# Patient Record
Sex: Male | Born: 1935
Health system: Southern US, Community
[De-identification: ages and names within clinical notes are randomized; demographics above are authoritative.]

## PROBLEM LIST (undated history)

## (undated) DIAGNOSIS — Z9289 Personal history of other medical treatment: Secondary | ICD-10-CM

## (undated) DIAGNOSIS — K5090 Crohn's disease, unspecified, without complications: Secondary | ICD-10-CM

## (undated) DIAGNOSIS — R9439 Abnormal result of other cardiovascular function study: Secondary | ICD-10-CM

## (undated) DIAGNOSIS — G621 Alcoholic polyneuropathy: Secondary | ICD-10-CM

## (undated) DIAGNOSIS — I10 Essential (primary) hypertension: Secondary | ICD-10-CM

## (undated) DIAGNOSIS — K5790 Diverticulosis of intestine, part unspecified, without perforation or abscess without bleeding: Secondary | ICD-10-CM

## (undated) DIAGNOSIS — F411 Generalized anxiety disorder: Secondary | ICD-10-CM

## (undated) DIAGNOSIS — J45909 Unspecified asthma, uncomplicated: Secondary | ICD-10-CM

## (undated) DIAGNOSIS — N411 Chronic prostatitis: Secondary | ICD-10-CM

## (undated) DIAGNOSIS — K644 Residual hemorrhoidal skin tags: Secondary | ICD-10-CM

## (undated) DIAGNOSIS — K449 Diaphragmatic hernia without obstruction or gangrene: Secondary | ICD-10-CM

## (undated) DIAGNOSIS — M5030 Other cervical disc degeneration, unspecified cervical region: Secondary | ICD-10-CM

## (undated) DIAGNOSIS — K219 Gastro-esophageal reflux disease without esophagitis: Secondary | ICD-10-CM

## (undated) DIAGNOSIS — J029 Acute pharyngitis, unspecified: Secondary | ICD-10-CM

## (undated) DIAGNOSIS — E039 Hypothyroidism, unspecified: Secondary | ICD-10-CM

## (undated) DIAGNOSIS — M1990 Unspecified osteoarthritis, unspecified site: Secondary | ICD-10-CM

## (undated) DIAGNOSIS — M109 Gout, unspecified: Secondary | ICD-10-CM

## (undated) DIAGNOSIS — R634 Abnormal weight loss: Secondary | ICD-10-CM

## (undated) DIAGNOSIS — R972 Elevated prostate specific antigen [PSA]: Secondary | ICD-10-CM

## (undated) DIAGNOSIS — M1710 Unilateral primary osteoarthritis, unspecified knee: Secondary | ICD-10-CM

## (undated) DIAGNOSIS — G5600 Carpal tunnel syndrome, unspecified upper limb: Secondary | ICD-10-CM

## (undated) DIAGNOSIS — H269 Unspecified cataract: Secondary | ICD-10-CM

## (undated) DIAGNOSIS — F1011 Alcohol abuse, in remission: Secondary | ICD-10-CM

## (undated) DIAGNOSIS — J449 Chronic obstructive pulmonary disease, unspecified: Secondary | ICD-10-CM

## (undated) DIAGNOSIS — E785 Hyperlipidemia, unspecified: Secondary | ICD-10-CM

## (undated) DIAGNOSIS — I2510 Atherosclerotic heart disease of native coronary artery without angina pectoris: Secondary | ICD-10-CM

## (undated) DIAGNOSIS — D1803 Hemangioma of intra-abdominal structures: Secondary | ICD-10-CM

## (undated) DIAGNOSIS — M25559 Pain in unspecified hip: Secondary | ICD-10-CM

## (undated) DIAGNOSIS — D518 Other vitamin B12 deficiency anemias: Secondary | ICD-10-CM

## (undated) DIAGNOSIS — R160 Hepatomegaly, not elsewhere classified: Secondary | ICD-10-CM

## (undated) DIAGNOSIS — K222 Esophageal obstruction: Secondary | ICD-10-CM

## (undated) DIAGNOSIS — R55 Syncope and collapse: Secondary | ICD-10-CM

## (undated) DIAGNOSIS — Z86010 Personal history of colonic polyps: Secondary | ICD-10-CM

## (undated) DIAGNOSIS — R1310 Dysphagia, unspecified: Secondary | ICD-10-CM

## (undated) DIAGNOSIS — I447 Left bundle-branch block, unspecified: Secondary | ICD-10-CM

## (undated) DIAGNOSIS — J329 Chronic sinusitis, unspecified: Secondary | ICD-10-CM

## (undated) DIAGNOSIS — M7500 Adhesive capsulitis of unspecified shoulder: Secondary | ICD-10-CM

## (undated) HISTORY — DX: Chronic sinusitis, unspecified: J32.9

## (undated) HISTORY — DX: Left bundle-branch block, unspecified: I44.7

## (undated) HISTORY — DX: Syncope and collapse: R55

## (undated) HISTORY — DX: Elevated prostate specific antigen (PSA): R97.20

## (undated) HISTORY — DX: Personal history of other medical treatment: Z92.89

## (undated) HISTORY — DX: Chronic prostatitis: N41.1

## (undated) HISTORY — DX: Other cervical disc degeneration, unspecified cervical region: M50.30

## (undated) HISTORY — DX: Adhesive capsulitis of unspecified shoulder: M75.00

## (undated) HISTORY — DX: Chronic obstructive pulmonary disease, unspecified: J44.9

## (undated) HISTORY — DX: Gastro-esophageal reflux disease without esophagitis: K21.9

## (undated) HISTORY — DX: Unspecified asthma, uncomplicated: J45.909

## (undated) HISTORY — DX: Unilateral primary osteoarthritis, unspecified knee: M17.10

## (undated) HISTORY — PX: CARDIAC CATHETERIZATION: SHX172

## (undated) HISTORY — DX: Abnormal weight loss: R63.4

## (undated) HISTORY — DX: Dysphagia, unspecified: R13.10

## (undated) HISTORY — DX: Crohn's disease, unspecified, without complications: K50.90

## (undated) HISTORY — DX: Atherosclerotic heart disease of native coronary artery without angina pectoris: I25.10

## (undated) HISTORY — DX: Essential (primary) hypertension: I10

## (undated) HISTORY — DX: Diverticulosis of intestine, part unspecified, without perforation or abscess without bleeding: K57.90

## (undated) HISTORY — DX: Abnormal result of other cardiovascular function study: R94.39

## (undated) HISTORY — DX: Diaphragmatic hernia without obstruction or gangrene: K44.9

## (undated) HISTORY — DX: Alcohol abuse, in remission: F10.11

## (undated) HISTORY — DX: Residual hemorrhoidal skin tags: K64.4

## (undated) HISTORY — DX: Hyperlipidemia, unspecified: E78.5

## (undated) HISTORY — DX: Esophageal obstruction: K22.2

## (undated) HISTORY — DX: Carpal tunnel syndrome, unspecified upper limb: G56.00

## (undated) HISTORY — DX: Alcoholic polyneuropathy: G62.1

## (undated) HISTORY — DX: Generalized anxiety disorder: F41.1

## (undated) HISTORY — DX: Gout, unspecified: M10.9

## (undated) HISTORY — DX: Hemangioma of intra-abdominal structures: D18.03

## (undated) HISTORY — DX: Personal history of colonic polyps: Z86.010

## (undated) HISTORY — DX: Unspecified cataract: H26.9

## (undated) HISTORY — DX: Unspecified osteoarthritis, unspecified site: M19.90

## (undated) HISTORY — PX: OTHER SURGICAL HISTORY: SHX169

## (undated) HISTORY — PX: EYE SURGERY: SHX253

## (undated) HISTORY — PX: TONSILLECTOMY: SUR1361

## (undated) HISTORY — DX: Other vitamin B12 deficiency anemias: D51.8

## (undated) HISTORY — DX: Hypothyroidism, unspecified: E03.9

## (undated) HISTORY — DX: Hepatomegaly, not elsewhere classified: R16.0

## (undated) HISTORY — DX: Pain in unspecified hip: M25.559

---

## 1997-03-05 DIAGNOSIS — Z860100 Personal history of colon polyps, unspecified: Secondary | ICD-10-CM

## 1997-03-05 HISTORY — DX: Personal history of colonic polyps: Z86.010

## 1997-03-05 HISTORY — DX: Personal history of colon polyps, unspecified: Z86.0100

## 2003-02-15 ENCOUNTER — Encounter: Payer: Self-pay | Admitting: Otolaryngology

## 2003-02-15 ENCOUNTER — Ambulatory Visit (HOSPITAL_COMMUNITY): Admission: RE | Admit: 2003-02-15 | Discharge: 2003-02-15 | Payer: Self-pay | Admitting: Otolaryngology

## 2003-02-15 ENCOUNTER — Encounter (INDEPENDENT_AMBULATORY_CARE_PROVIDER_SITE_OTHER): Payer: Self-pay | Admitting: *Deleted

## 2004-12-10 ENCOUNTER — Ambulatory Visit: Payer: Self-pay | Admitting: Internal Medicine

## 2004-12-17 ENCOUNTER — Ambulatory Visit: Payer: Self-pay | Admitting: Internal Medicine

## 2005-02-13 ENCOUNTER — Ambulatory Visit: Payer: Self-pay | Admitting: Internal Medicine

## 2005-02-23 ENCOUNTER — Ambulatory Visit: Payer: Self-pay | Admitting: Gastroenterology

## 2005-03-09 ENCOUNTER — Ambulatory Visit: Payer: Self-pay | Admitting: Gastroenterology

## 2005-04-08 LAB — HM COLONOSCOPY

## 2005-04-15 ENCOUNTER — Ambulatory Visit: Payer: Self-pay | Admitting: Internal Medicine

## 2005-04-16 ENCOUNTER — Ambulatory Visit: Payer: Self-pay | Admitting: Cardiology

## 2005-04-21 ENCOUNTER — Ambulatory Visit: Payer: Self-pay | Admitting: Internal Medicine

## 2005-06-17 ENCOUNTER — Ambulatory Visit: Payer: Self-pay | Admitting: Internal Medicine

## 2005-07-22 ENCOUNTER — Ambulatory Visit: Payer: Self-pay | Admitting: Internal Medicine

## 2005-08-31 ENCOUNTER — Ambulatory Visit: Payer: Self-pay | Admitting: Internal Medicine

## 2005-12-08 ENCOUNTER — Ambulatory Visit: Payer: Self-pay | Admitting: Internal Medicine

## 2005-12-15 ENCOUNTER — Ambulatory Visit: Payer: Self-pay | Admitting: Internal Medicine

## 2006-02-24 ENCOUNTER — Ambulatory Visit: Payer: Self-pay | Admitting: Internal Medicine

## 2006-06-21 ENCOUNTER — Ambulatory Visit: Payer: Self-pay | Admitting: Internal Medicine

## 2006-08-17 ENCOUNTER — Encounter: Admission: RE | Admit: 2006-08-17 | Discharge: 2006-08-17 | Payer: Self-pay | Admitting: Orthopedic Surgery

## 2006-08-19 ENCOUNTER — Ambulatory Visit (HOSPITAL_BASED_OUTPATIENT_CLINIC_OR_DEPARTMENT_OTHER): Admission: RE | Admit: 2006-08-19 | Discharge: 2006-08-19 | Payer: Self-pay | Admitting: Orthopedic Surgery

## 2006-09-21 ENCOUNTER — Ambulatory Visit: Payer: Self-pay | Admitting: Internal Medicine

## 2006-12-22 ENCOUNTER — Ambulatory Visit: Payer: Self-pay | Admitting: Internal Medicine

## 2007-03-23 ENCOUNTER — Ambulatory Visit: Payer: Self-pay | Admitting: Internal Medicine

## 2007-05-31 DIAGNOSIS — E039 Hypothyroidism, unspecified: Secondary | ICD-10-CM

## 2007-05-31 DIAGNOSIS — M109 Gout, unspecified: Secondary | ICD-10-CM

## 2007-05-31 DIAGNOSIS — K219 Gastro-esophageal reflux disease without esophagitis: Secondary | ICD-10-CM

## 2007-05-31 DIAGNOSIS — F1011 Alcohol abuse, in remission: Secondary | ICD-10-CM

## 2007-05-31 DIAGNOSIS — M1990 Unspecified osteoarthritis, unspecified site: Secondary | ICD-10-CM

## 2007-05-31 DIAGNOSIS — J45909 Unspecified asthma, uncomplicated: Secondary | ICD-10-CM

## 2007-05-31 DIAGNOSIS — J329 Chronic sinusitis, unspecified: Secondary | ICD-10-CM

## 2007-05-31 DIAGNOSIS — I10 Essential (primary) hypertension: Secondary | ICD-10-CM

## 2007-05-31 HISTORY — DX: Gout, unspecified: M10.9

## 2007-05-31 HISTORY — DX: Unspecified osteoarthritis, unspecified site: M19.90

## 2007-05-31 HISTORY — DX: Unspecified asthma, uncomplicated: J45.909

## 2007-05-31 HISTORY — DX: Chronic sinusitis, unspecified: J32.9

## 2007-05-31 HISTORY — DX: Gastro-esophageal reflux disease without esophagitis: K21.9

## 2007-05-31 HISTORY — DX: Hypothyroidism, unspecified: E03.9

## 2007-05-31 HISTORY — DX: Alcohol abuse, in remission: F10.11

## 2007-05-31 HISTORY — DX: Essential (primary) hypertension: I10

## 2007-06-22 ENCOUNTER — Ambulatory Visit: Payer: Self-pay | Admitting: Internal Medicine

## 2007-06-22 DIAGNOSIS — M5030 Other cervical disc degeneration, unspecified cervical region: Secondary | ICD-10-CM

## 2007-06-22 HISTORY — DX: Other cervical disc degeneration, unspecified cervical region: M50.30

## 2007-06-22 LAB — CONVERTED CEMR LAB
BUN: 11 mg/dL (ref 6–23)
Calcium: 9.7 mg/dL (ref 8.4–10.5)
Chloride: 103 meq/L (ref 96–112)
Creatinine, Ser: 0.8 mg/dL (ref 0.4–1.5)
GFR calc non Af Amer: 98 mL/min
Sodium: 139 meq/L (ref 135–145)
TSH: 1.9 microintl units/mL (ref 0.35–5.50)

## 2007-09-05 ENCOUNTER — Ambulatory Visit: Payer: Self-pay | Admitting: Internal Medicine

## 2007-09-05 DIAGNOSIS — G5600 Carpal tunnel syndrome, unspecified upper limb: Secondary | ICD-10-CM

## 2007-09-05 HISTORY — DX: Carpal tunnel syndrome, unspecified upper limb: G56.00

## 2007-09-07 ENCOUNTER — Encounter: Payer: Self-pay | Admitting: Internal Medicine

## 2007-10-05 ENCOUNTER — Encounter: Payer: Self-pay | Admitting: Internal Medicine

## 2007-12-29 ENCOUNTER — Ambulatory Visit: Payer: Self-pay | Admitting: Internal Medicine

## 2007-12-29 DIAGNOSIS — M7500 Adhesive capsulitis of unspecified shoulder: Secondary | ICD-10-CM | POA: Insufficient documentation

## 2007-12-29 HISTORY — DX: Adhesive capsulitis of unspecified shoulder: M75.00

## 2007-12-29 LAB — CONVERTED CEMR LAB
Calcium: 9.6 mg/dL (ref 8.4–10.5)
Cholesterol: 224 mg/dL (ref 0–200)
Direct LDL: 147.8 mg/dL
GFR calc non Af Amer: 98 mL/min
Potassium: 4.7 meq/L (ref 3.5–5.1)
TSH: 2.55 microintl units/mL (ref 0.35–5.50)
Total CHOL/HDL Ratio: 5.5
Triglycerides: 193 mg/dL — ABNORMAL HIGH (ref 0–149)

## 2008-03-29 ENCOUNTER — Telehealth (INDEPENDENT_AMBULATORY_CARE_PROVIDER_SITE_OTHER): Payer: Self-pay | Admitting: *Deleted

## 2008-03-29 ENCOUNTER — Ambulatory Visit: Payer: Self-pay | Admitting: Internal Medicine

## 2008-03-29 DIAGNOSIS — D518 Other vitamin B12 deficiency anemias: Secondary | ICD-10-CM

## 2008-03-29 DIAGNOSIS — N411 Chronic prostatitis: Secondary | ICD-10-CM

## 2008-03-29 DIAGNOSIS — G621 Alcoholic polyneuropathy: Secondary | ICD-10-CM | POA: Insufficient documentation

## 2008-03-29 HISTORY — DX: Other vitamin B12 deficiency anemias: D51.8

## 2008-03-29 HISTORY — DX: Alcoholic polyneuropathy: G62.1

## 2008-03-29 HISTORY — DX: Chronic prostatitis: N41.1

## 2008-03-29 LAB — CONVERTED CEMR LAB
BUN: 10 mg/dL (ref 6–23)
CO2: 32 meq/L (ref 19–32)
Calcium: 9.5 mg/dL (ref 8.4–10.5)
Creatinine, Ser: 0.8 mg/dL (ref 0.4–1.5)
Folate: 12 ng/mL
Glucose, Bld: 88 mg/dL (ref 70–99)
Ketones, urine, test strip: NEGATIVE
Nitrite: NEGATIVE
Specific Gravity, Urine: <1.005
TSH: 0.5 microintl units/mL (ref 0.35–5.50)

## 2008-05-15 ENCOUNTER — Ambulatory Visit: Payer: Self-pay | Admitting: Internal Medicine

## 2008-06-27 ENCOUNTER — Encounter: Payer: Self-pay | Admitting: Internal Medicine

## 2008-07-13 ENCOUNTER — Encounter: Admission: RE | Admit: 2008-07-13 | Discharge: 2008-07-13 | Payer: Self-pay | Admitting: Orthopedic Surgery

## 2008-07-17 ENCOUNTER — Ambulatory Visit (HOSPITAL_BASED_OUTPATIENT_CLINIC_OR_DEPARTMENT_OTHER): Admission: RE | Admit: 2008-07-17 | Discharge: 2008-07-17 | Payer: Self-pay | Admitting: Orthopedic Surgery

## 2008-08-21 ENCOUNTER — Ambulatory Visit: Payer: Self-pay | Admitting: Internal Medicine

## 2008-08-22 ENCOUNTER — Encounter: Payer: Self-pay | Admitting: Internal Medicine

## 2008-09-25 ENCOUNTER — Ambulatory Visit: Payer: Self-pay | Admitting: Gastroenterology

## 2008-09-25 DIAGNOSIS — R1310 Dysphagia, unspecified: Secondary | ICD-10-CM

## 2008-09-25 DIAGNOSIS — R160 Hepatomegaly, not elsewhere classified: Secondary | ICD-10-CM

## 2008-09-25 HISTORY — DX: Dysphagia, unspecified: R13.10

## 2008-09-25 HISTORY — DX: Hepatomegaly, not elsewhere classified: R16.0

## 2008-09-25 LAB — CONVERTED CEMR LAB
ALT: 21 units/L (ref 0–53)
AST: 27 units/L (ref 0–37)
Albumin: 3.9 g/dL (ref 3.5–5.2)
BUN: 9 mg/dL (ref 6–23)
Basophils Absolute: 0 10*3/uL (ref 0.0–0.1)
Basophils Relative: 0.4 % (ref 0.0–3.0)
Calcium: 9.2 mg/dL (ref 8.4–10.5)
Creatinine, Ser: 1 mg/dL (ref 0.4–1.5)
Eosinophils Absolute: 0.1 10*3/uL (ref 0.0–0.7)
Eosinophils Relative: 2.6 % (ref 0.0–5.0)
Ferritin: 42.1 ng/mL (ref 22.0–322.0)
GFR calc non Af Amer: 76 mL/min
HCT: 40.6 % (ref 39.0–52.0)
Hemoglobin: 13.9 g/dL (ref 13.0–17.0)
MCHC: 34.4 g/dL (ref 30.0–36.0)
MCV: 99.4 fL (ref 78.0–100.0)
Neutro Abs: 2.1 10*3/uL (ref 1.4–7.7)
RBC: 4.08 M/uL — ABNORMAL LOW (ref 4.22–5.81)
TSH: 1.22 microintl units/mL (ref 0.35–5.50)
Total Bilirubin: 0.9 mg/dL (ref 0.3–1.2)
WBC: 4.1 10*3/uL — ABNORMAL LOW (ref 4.5–10.5)

## 2008-09-28 ENCOUNTER — Ambulatory Visit (HOSPITAL_COMMUNITY): Admission: RE | Admit: 2008-09-28 | Discharge: 2008-09-28 | Payer: Self-pay | Admitting: Gastroenterology

## 2008-10-01 ENCOUNTER — Ambulatory Visit: Payer: Self-pay | Admitting: Gastroenterology

## 2008-11-19 ENCOUNTER — Ambulatory Visit: Payer: Self-pay | Admitting: Internal Medicine

## 2008-11-19 DIAGNOSIS — R634 Abnormal weight loss: Secondary | ICD-10-CM

## 2008-11-19 DIAGNOSIS — R55 Syncope and collapse: Secondary | ICD-10-CM

## 2008-11-19 DIAGNOSIS — F411 Generalized anxiety disorder: Secondary | ICD-10-CM

## 2008-11-19 HISTORY — DX: Abnormal weight loss: R63.4

## 2008-11-19 HISTORY — DX: Generalized anxiety disorder: F41.1

## 2008-11-19 HISTORY — DX: Syncope and collapse: R55

## 2008-11-19 LAB — CONVERTED CEMR LAB: Hgb A1c MFr Bld: 6.4 % — ABNORMAL HIGH (ref 4.6–6.0)

## 2008-12-12 ENCOUNTER — Telehealth: Payer: Self-pay | Admitting: Internal Medicine

## 2009-02-11 ENCOUNTER — Ambulatory Visit: Payer: Self-pay | Admitting: Internal Medicine

## 2009-02-11 DIAGNOSIS — I447 Left bundle-branch block, unspecified: Secondary | ICD-10-CM

## 2009-02-11 HISTORY — DX: Left bundle-branch block, unspecified: I44.7

## 2009-02-18 ENCOUNTER — Ambulatory Visit: Payer: Self-pay

## 2009-02-18 ENCOUNTER — Encounter: Payer: Self-pay | Admitting: Internal Medicine

## 2009-03-05 ENCOUNTER — Encounter: Payer: Self-pay | Admitting: Cardiovascular Disease

## 2009-03-05 ENCOUNTER — Ambulatory Visit: Payer: Self-pay | Admitting: Cardiovascular Disease

## 2009-03-05 DIAGNOSIS — R9439 Abnormal result of other cardiovascular function study: Secondary | ICD-10-CM

## 2009-03-05 HISTORY — DX: Abnormal result of other cardiovascular function study: R94.39

## 2009-03-13 ENCOUNTER — Ambulatory Visit: Payer: Self-pay | Admitting: Cardiovascular Disease

## 2009-03-13 ENCOUNTER — Ambulatory Visit (HOSPITAL_COMMUNITY): Admission: RE | Admit: 2009-03-13 | Discharge: 2009-03-13 | Payer: Self-pay | Admitting: Cardiovascular Disease

## 2009-03-26 ENCOUNTER — Encounter: Payer: Self-pay | Admitting: Internal Medicine

## 2009-03-27 ENCOUNTER — Ambulatory Visit: Payer: Self-pay | Admitting: Internal Medicine

## 2009-03-27 DIAGNOSIS — E785 Hyperlipidemia, unspecified: Secondary | ICD-10-CM

## 2009-03-27 HISTORY — DX: Hyperlipidemia, unspecified: E78.5

## 2009-04-04 ENCOUNTER — Ambulatory Visit: Payer: Self-pay | Admitting: Cardiovascular Disease

## 2009-04-04 DIAGNOSIS — I2510 Atherosclerotic heart disease of native coronary artery without angina pectoris: Secondary | ICD-10-CM | POA: Insufficient documentation

## 2009-04-04 HISTORY — DX: Atherosclerotic heart disease of native coronary artery without angina pectoris: I25.10

## 2009-06-26 ENCOUNTER — Ambulatory Visit: Payer: Self-pay | Admitting: Internal Medicine

## 2009-06-26 LAB — CONVERTED CEMR LAB: Cholesterol: 141 mg/dL (ref 0–200)

## 2009-09-25 ENCOUNTER — Ambulatory Visit: Payer: Self-pay | Admitting: Internal Medicine

## 2009-09-25 LAB — CONVERTED CEMR LAB: HDL goal, serum: 40 mg/dL

## 2009-09-26 ENCOUNTER — Encounter (INDEPENDENT_AMBULATORY_CARE_PROVIDER_SITE_OTHER): Payer: Self-pay | Admitting: *Deleted

## 2009-10-08 ENCOUNTER — Encounter (INDEPENDENT_AMBULATORY_CARE_PROVIDER_SITE_OTHER): Payer: Self-pay | Admitting: *Deleted

## 2009-10-14 ENCOUNTER — Ambulatory Visit: Payer: Self-pay | Admitting: Cardiovascular Disease

## 2009-12-13 ENCOUNTER — Ambulatory Visit: Payer: Self-pay | Admitting: Internal Medicine

## 2009-12-13 LAB — CONVERTED CEMR LAB
ALT: 18 units/L (ref 0–53)
AST: 22 units/L (ref 0–37)
Albumin: 4.4 g/dL (ref 3.5–5.2)
CO2: 20 meq/L (ref 19–32)
Calcium: 10 mg/dL (ref 8.4–10.5)
Chloride: 105 meq/L (ref 96–112)
HDL: 43 mg/dL (ref >39)
Potassium: 4.5 meq/L (ref 3.5–5.3)
Sodium: 142 meq/L (ref 135–145)
TSH: 0.91 microintl units/mL (ref 0.35–5.50)
Total Bilirubin: 0.5 mg/dL (ref 0.3–1.2)
Total CHOL/HDL Ratio: 3.2
VLDL: 32 mg/dL (ref 0–40)

## 2009-12-20 ENCOUNTER — Encounter: Payer: Self-pay | Admitting: *Deleted

## 2009-12-20 ENCOUNTER — Ambulatory Visit: Payer: Self-pay | Admitting: Internal Medicine

## 2009-12-20 LAB — CONVERTED CEMR LAB

## 2010-03-20 ENCOUNTER — Ambulatory Visit: Payer: Self-pay | Admitting: Internal Medicine

## 2010-03-20 DIAGNOSIS — M1710 Unilateral primary osteoarthritis, unspecified knee: Secondary | ICD-10-CM | POA: Insufficient documentation

## 2010-03-20 DIAGNOSIS — M25559 Pain in unspecified hip: Secondary | ICD-10-CM

## 2010-03-20 DIAGNOSIS — R972 Elevated prostate specific antigen [PSA]: Secondary | ICD-10-CM

## 2010-03-20 HISTORY — DX: Pain in unspecified hip: M25.559

## 2010-03-20 HISTORY — DX: Unilateral primary osteoarthritis, unspecified knee: M17.10

## 2010-03-20 HISTORY — DX: Elevated prostate specific antigen (PSA): R97.20

## 2010-03-20 LAB — CONVERTED CEMR LAB
Direct LDL: 83.5 mg/dL
PSA, Free Pct: 29 (ref >25)
PSA, Free: 0.3 ng/mL

## 2010-03-25 ENCOUNTER — Ambulatory Visit: Payer: Self-pay | Admitting: Gastroenterology

## 2010-03-25 DIAGNOSIS — Z860100 Personal history of colon polyps, unspecified: Secondary | ICD-10-CM | POA: Insufficient documentation

## 2010-04-01 ENCOUNTER — Ambulatory Visit: Payer: Self-pay | Admitting: Cardiovascular Disease

## 2010-06-19 ENCOUNTER — Ambulatory Visit: Payer: Self-pay | Admitting: Internal Medicine

## 2010-09-25 ENCOUNTER — Ambulatory Visit: Payer: Self-pay | Admitting: Internal Medicine

## 2010-09-25 LAB — CONVERTED CEMR LAB
Chloride: 99 meq/L (ref 96–112)
GFR calc non Af Amer: 77.19 mL/min (ref >60)
Potassium: 4.3 meq/L (ref 3.5–5.1)
Sodium: 132 meq/L — ABNORMAL LOW (ref 135–145)

## 2010-12-02 ENCOUNTER — Encounter: Payer: Self-pay | Admitting: Internal Medicine

## 2010-12-21 LAB — PROTIME-INR

## 2010-12-27 ENCOUNTER — Encounter: Payer: Self-pay | Admitting: *Deleted

## 2010-12-28 LAB — CONVERTED CEMR LAB
ALT: 15 units/L (ref 0–53)
AST: 18 units/L (ref 0–37)
Albumin: 3.9 g/dL (ref 3.5–5.2)
BUN: 12 mg/dL (ref 6–23)
Basophils Relative: 0.2 % (ref 0.0–3.0)
CO2: 31 meq/L (ref 19–32)
Calcium: 9.2 mg/dL (ref 8.4–10.5)
Chloride: 106 meq/L (ref 96–112)
Cholesterol: 198 mg/dL (ref 0–200)
Creatinine, Ser: 0.9 mg/dL (ref 0.4–1.5)
Eosinophils Relative: 4 % (ref 0.0–5.0)
Glucose, Bld: 103 mg/dL — ABNORMAL HIGH (ref 70–99)
Hemoglobin: 13.6 g/dL (ref 13.0–17.0)
LDL Cholesterol: 118 mg/dL — ABNORMAL HIGH (ref 0–99)
Lymphocytes Relative: 27.5 % (ref 12.0–46.0)
MCHC: 34.8 g/dL (ref 30.0–36.0)
Neutro Abs: 2.4 10*3/uL (ref 1.4–7.7)
Neutrophils Relative %: 53.1 % (ref 43.0–77.0)
PSA: 5.05 ng/mL — ABNORMAL HIGH (ref 0.10–4.00)
RBC: 4 M/uL — ABNORMAL LOW (ref 4.22–5.81)
TSH: 1.67 microintl units/mL (ref 0.35–5.50)
Total Protein: 6.8 g/dL (ref 6.0–8.3)
VLDL: 28 mg/dL (ref 0–40)
WBC: 4.5 10*3/uL (ref 4.5–10.5)

## 2011-01-01 NOTE — Assessment & Plan Note (Signed)
Summary: 3 MONTH ROV/NRJ   Vital Signs:  Patient profile:   77 year old male Height:      68 inches Weight:      146 pounds Temp:     98.2 degrees F 14oral Pulse rate:   68 / minute BP sitting:   150 / 80  (left arm)  Vitals Entered By: Willy Eddy, LPN (March 20, 2010 9:25 AM) CC: roa- c/o rt knee pain, Hypertension Management   Primary Care Provider:  Darryll Capers MD  CC:  roa- c/o rt knee pain and Hypertension Management.  History of Present Illness: pt states that he "hurts all the time" has pain in his knees has not been to an orthopedist and needs evaluation for TKR he thing this was precepitated by "overduing it" in the yard last saturday   Hypertension History:      He denies headache, chest pain, palpitations, dyspnea with exertion, orthopnea, PND, peripheral edema, visual symptoms, neurologic problems, syncope, and side effects from treatment.        Positive major cardiovascular risk factors include male age 54 years old or older, hyperlipidemia, and hypertension.  Negative major cardiovascular risk factors include non-tobacco-user status.        Positive history for target organ damage include ASHD (either angina/prior MI/prior CABG).     Preventive Screening-Counseling & Management  Alcohol-Tobacco     Alcohol drinks/day: stopped     Smoking Status: quit     Year Quit: 2002  Problems Prior to Update: 1)  Cad, Native Vessel  (ICD-414.01) 2)  Cad  (ICD-414.00) 3)  Hyperlipidemia  (ICD-272.4) 4)  Abnormal Cv (STRESS) Test  (ICD-794.39) 5)  Left Bundle Branch Block  (ICD-426.3) 6)  Preventive Health Care  (ICD-V70.0) 7)  Anxiety State, Unspecified  (ICD-300.00) 8)  Syncope  (ICD-780.2) 9)  Weight Loss, Recent  (ICD-783.21) 10)  Hepatomegaly  (ICD-789.1) 11)  Dysphagia Unspecified  (ICD-787.20) 12)  Anemia, B12 Deficiency  (ICD-281.1) 13)  Chronic Prostatitis  (ICD-601.1) 14)  Alcoholic Polyneuropathy  (ICD-357.5) 15)  Adhesive Capsulitis of  Shoulder  (ICD-726.0) 16)  Carpal Tunnel Syndrome, Right  (ICD-354.0) 17)  Degenerative Disc Disease, Cervical Spine  (ICD-722.4) 18)  Family History of Alcoholism/addiction  (ICD-V61.41) 19)  Family History of Cad Male 1st Degree Relative <50  (ICD-V17.3) 20)  Asthma  (ICD-493.90) 21)  Hypertension  (ICD-401.9) 22)  Abuse, Alcohol, in Remission  (ICD-305.03) 23)  Gerd  (ICD-530.81) 24)  Osteoarthritis  (ICD-715.90) 25)  Gout  (ICD-274.9) 26)  Hypothyroidism  (ICD-244.9) 27)  Sinusitis, Chronic Nos  (ICD-473.9)  Current Problems (verified): 1)  Cad, Native Vessel  (ICD-414.01) 2)  Cad  (ICD-414.00) 3)  Hyperlipidemia  (ICD-272.4) 4)  Abnormal Cv (STRESS) Test  (ICD-794.39) 5)  Left Bundle Branch Block  (ICD-426.3) 6)  Preventive Health Care  (ICD-V70.0) 7)  Anxiety State, Unspecified  (ICD-300.00) 8)  Syncope  (ICD-780.2) 9)  Weight Loss, Recent  (ICD-783.21) 10)  Hepatomegaly  (ICD-789.1) 11)  Dysphagia Unspecified  (ICD-787.20) 12)  Anemia, B12 Deficiency  (ICD-281.1) 13)  Chronic Prostatitis  (ICD-601.1) 14)  Alcoholic Polyneuropathy  (ICD-357.5) 15)  Adhesive Capsulitis of Shoulder  (ICD-726.0) 16)  Carpal Tunnel Syndrome, Right  (ICD-354.0) 17)  Degenerative Disc Disease, Cervical Spine  (ICD-722.4) 18)  Family History of Alcoholism/addiction  (ICD-V61.41) 19)  Family History of Cad Male 1st Degree Relative <50  (ICD-V17.3) 20)  Asthma  (ICD-493.90) 21)  Hypertension  (ICD-401.9) 22)  Abuse, Alcohol, in Remission  (ICD-305.03) 23)  Gerd  (ICD-530.81) 24)  Osteoarthritis  (ICD-715.90) 25)  Gout  (ICD-274.9) 26)  Hypothyroidism  (ICD-244.9) 27)  Sinusitis, Chronic Nos  (ICD-473.9)  Medications Prior to Update: 1)  Proair Hfa 108 (90 Base) Mcg/act Aers (Albuterol Sulfate) .... Use As Directed 2)  Diovan 160 Mg Tabs (Valsartan) .... Take 1 Tablet By Mouth Once A Day 3)  Singulair 10 Mg Tabs (Montelukast Sodium) .... Take 1 Tablet By Mouth Once A Day 4)  Synthroid 75  Mcg  Tabs (Levothyroxine Sodium) .... Once Daily 5)  Prilosec 20 Mg Cpdr (Omeprazole) .Marland Kitchen.. 1 Two Times A Day 6)  Metoprolol Tartrate 25 Mg Tabs (Metoprolol Tartrate) .Marland Kitchen.. 1 Two Times A Day 7)  Crestor 10 Mg Tabs (Rosuvastatin Calcium) .Marland Kitchen.. 1 Once Daily 8)  Baby Aspirin 81 Mg Chew (Aspirin) .Marland Kitchen.. 1 Tablet By Mouth Once Daily 9)  Diclofenac Sodium 75 Mg Tbec (Diclofenac Sodium) .Marland Kitchen.. 1 Once Daily As Needed  Current Medications (verified): 1)  Proair Hfa 108 (90 Base) Mcg/act Aers (Albuterol Sulfate) .... Use As Directed 2)  Diovan 160 Mg Tabs (Valsartan) .... Take 1 Tablet By Mouth Once A Day 3)  Singulair 10 Mg Tabs (Montelukast Sodium) .... Take 1 Tablet By Mouth Once A Day 4)  Synthroid 75 Mcg  Tabs (Levothyroxine Sodium) .... Once Daily 5)  Prilosec 20 Mg Cpdr (Omeprazole) .Marland Kitchen.. 1 Two Times A Day 6)  Metoprolol Tartrate 25 Mg Tabs (Metoprolol Tartrate) .Marland Kitchen.. 1 Two Times A Day 7)  Crestor 10 Mg Tabs (Rosuvastatin Calcium) .Marland Kitchen.. 1 Once Daily 8)  Baby Aspirin 81 Mg Chew (Aspirin) .Marland Kitchen.. 1 Tablet By Mouth Once Daily 9)  Diclofenac Sodium 75 Mg Tbec (Diclofenac Sodium) .Marland Kitchen.. 1 Once Daily As Needed 10)  Aleve 220 Mg Tabs (Naproxen Sodium) .... As Needed  Allergies (verified): 1)  ! Penicillin 2)  Amoxicillin (Amoxicillin)  Directives: 1)  Living Will With No Machines   Past History:  Family History: Last updated: 03/05/2009 Family History of CAD Male 1st degree relative <50 Family History of Alcoholism/Addiction Family History Other cancer mother Father died cancer Brother with MI in his 74s  Social History: Last updated: 09/25/2008 Retired Married Former Smoker stopped 4 years ago Alcohol use-no stopped 1 year ago Illicit Drug Use - no Patient gets regular exercise.  Risk Factors: Alcohol Use: stopped (03/20/2010) Diet: reveiwed diet  (12/20/2009) Exercise: yes (12/20/2009)  Risk Factors: Smoking Status: quit (03/20/2010)  Past medical, surgical, family and social histories  (including risk factors) reviewed, and no changes noted (except as noted below).  Past Medical History: Reviewed history from 10/14/2009 and no changes required. Hypothyroidism chronic sinusitis Gout Osteoarthritis GERD 24mm mass in left lobe of liver Hypertension Asthma/COPD alcohol abuse 303.03 LBBB CAD-70% mid LAD, medical management  Past Surgical History: Reviewed history from 08/21/2008 and no changes required. pulmonary colapse with chect tube Tonsillectomy Carpal tunnel release  Family History: Reviewed history from 03/05/2009 and no changes required. Family History of CAD Male 1st degree relative <50 Family History of Alcoholism/Addiction Family History Other cancer mother Father died cancer Brother with MI in his 70s  Social History: Reviewed history from 09/25/2008 and no changes required. Retired Married Former Smoker stopped 4 years ago Alcohol use-no stopped 1 year ago Illicit Drug Use - no Patient gets regular exercise.  Review of Systems  The patient denies anorexia, fever, weight loss, weight gain, vision loss, decreased hearing, hoarseness, chest pain, syncope, dyspnea on exertion, peripheral edema, prolonged cough, headaches, hemoptysis, abdominal pain, melena, hematochezia, severe  indigestion/heartburn, hematuria, incontinence, genital sores, muscle weakness, suspicious skin lesions, transient blindness, difficulty walking, depression, unusual weight change, abnormal bleeding, enlarged lymph nodes, angioedema, and breast masses.    Physical Exam  General:  alert and well-hydrated.   Head:  Normocephalic and atraumatic.male-pattern balding.   Eyes:  PERRLA, no icterus.exam deferred to patient's ophthalmologist.   Ears:  R ear normal and L ear normal.   Nose:  no external deformity and no nasal discharge.   Mouth:  pharynx pink and moist and postnasal drip.   Neck:  No deformities, masses, or tenderness noted. Lungs:  normal respiratory effort and  R base dullness.   Heart:  normal rate, regular rhythm, no gallop, and no rub.     Impression & Recommendations:  Problem # 1:  HYPERLIPIDEMIA (ICD-272.4) Assessment Unchanged  His updated medication list for this problem includes:    Crestor 10 Mg Tabs (Rosuvastatin calcium) .Marland Kitchen... 1 once daily  Labs Reviewed: SGOT: 22 (12/13/2009)   SGPT: 18 (12/13/2009)  Lipid Goals: Chol Goal: 200 (09/25/2009)   HDL Goal: 40 (09/25/2009)   LDL Goal: 100 (09/25/2009)   TG Goal: 150 (09/25/2009)  Prior 10 Yr Risk Heart Disease: N/A (09/25/2009)   HDL:43 (12/13/2009), 38.60 (06/26/2009)  LDL:63 (12/13/2009), 118 (16/08/9603)  Chol:138 (12/13/2009), 141 (06/26/2009)  Trig:161 (12/13/2009), 139 (02/11/2009)  Orders: TLB-Cholesterol, HDL (83718-HDL) TLB-Cholesterol, Direct LDL (83721-DIRLDL) TLB-Cholesterol, Total (82465-CHO) TLB-TSH (Thyroid Stimulating Hormone) (84443-TSH) Venipuncture (54098)  Problem # 2:  HYPERTENSION (ICD-401.9)  His updated medication list for this problem includes:    Diovan 160 Mg Tabs (Valsartan) .Marland Kitchen... Take 1 tablet by mouth once a day    Metoprolol Tartrate 25 Mg Tabs (Metoprolol tartrate) .Marland Kitchen... 1 two times a day  BP today: 150/80 Prior BP: 130/70 (12/20/2009)  Prior 10 Yr Risk Heart Disease: N/A (09/25/2009)  Labs Reviewed: K+: 4.5 (12/13/2009) Creat: : 0.82 (12/13/2009)   Chol: 138 (12/13/2009)   HDL: 43 (12/13/2009)   LDL: 63 (12/13/2009)   TG: 161 (12/13/2009)  Problem # 3:  LOC OSTEOARTHROS NOT SPEC PRIM/SEC LOWER LEG (ICD-715.36)  His updated medication list for this problem includes:    Baby Aspirin 81 Mg Chew (Aspirin) .Marland Kitchen... 1 tablet by mouth once daily    Diclofenac Sodium 75 Mg Tbec (Diclofenac sodium) .Marland Kitchen... 1 once daily as needed    Aleve 220 Mg Tabs (Naproxen sodium) .Marland Kitchen... As needed  Discussed use of medications, application of heat or cold, and exercises.   Orders: T-Hip Comp Right Min 2 views (73510TC) Joint Aspirate / Injection, Large  (20610) Depo- Medrol 40mg  (J1030)  Problem # 4:  HYPERLIPIDEMIA (ICD-272.4)  His updated medication list for this problem includes:    Crestor 10 Mg Tabs (Rosuvastatin calcium) .Marland Kitchen... 1 once daily  Orders: TLB-Cholesterol, HDL (83718-HDL) TLB-Cholesterol, Direct LDL (83721-DIRLDL) TLB-Cholesterol, Total (82465-CHO) TLB-TSH (Thyroid Stimulating Hormone) (84443-TSH) Venipuncture (11914)  Labs Reviewed: SGOT: 22 (12/13/2009)   SGPT: 18 (12/13/2009)  Lipid Goals: Chol Goal: 200 (09/25/2009)   HDL Goal: 40 (09/25/2009)   LDL Goal: 100 (09/25/2009)   TG Goal: 150 (09/25/2009)  Prior 10 Yr Risk Heart Disease: N/A (09/25/2009)   HDL:43 (12/13/2009), 38.60 (06/26/2009)  LDL:63 (12/13/2009), 118 (78/29/5621)  Chol:138 (12/13/2009), 141 (06/26/2009)  Trig:161 (12/13/2009), 139 (02/11/2009)  Complete Medication List: 1)  Proair Hfa 108 (90 Base) Mcg/act Aers (Albuterol sulfate) .... Use as directed 2)  Diovan 160 Mg Tabs (Valsartan) .... Take 1 tablet by mouth once a day 3)  Singulair 10 Mg Tabs (Montelukast sodium) .... Take  1 tablet by mouth once a day 4)  Synthroid 75 Mcg Tabs (Levothyroxine sodium) .... Once daily 5)  Prilosec 20 Mg Cpdr (Omeprazole) .Marland Kitchen.. 1 two times a day 6)  Metoprolol Tartrate 25 Mg Tabs (Metoprolol tartrate) .Marland Kitchen.. 1 two times a day 7)  Crestor 10 Mg Tabs (Rosuvastatin calcium) .Marland Kitchen.. 1 once daily 8)  Baby Aspirin 81 Mg Chew (Aspirin) .Marland Kitchen.. 1 tablet by mouth once daily 9)  Diclofenac Sodium 75 Mg Tbec (Diclofenac sodium) .Marland Kitchen.. 1 once daily as needed 10)  Aleve 220 Mg Tabs (Naproxen sodium) .... As needed  Other Orders: T-PSA Free ([address]) T-PSA Total ([address]) T-Knee Right 2 view (73560TC)  Hypertension Assessment/Plan:      The patient's hypertensive risk group is category C: Target organ damage and/or diabetes.  Today's blood pressure is 150/80.  His blood pressure goal is < 140/90.

## 2011-01-01 NOTE — Assessment & Plan Note (Signed)
Summary: 3 month fup//ccm/pt rescd from bump//ccm   Vital Signs:  Patient profile:   77 year old male Height:      68 inches Weight:      150 pounds BMI:     22.89 Temp:     98.2 degrees F oral Pulse rate:   68 / minute Resp:     14 per minute BP sitting:   130 / 80  (left arm)  Vitals Entered By: Willy Eddy, LPN (September 25, 2010 9:49 AM) CC: roa Is Patient Diabetic? No   Primary Care Provider:  Darryll Capers MD  CC:  roa.  History of Present Illness: Has been painting and has noted increased pain in his shoulders The  pt has severe OA of the right knees The pt noted "symptoms" frpom the list of side effects from the diovan that includes "increased urination" and leg cramps These are acute symptoms but since he read the PI he now relates much of these symtoms Nose runs every morning    Preventive Screening-Counseling & Management  Alcohol-Tobacco     Alcohol drinks/day: stopped     Smoking Status: quit     Year Quit: 2002     Tobacco Counseling: to remain off tobacco products  Problems Prior to Update: 1)  Personal Hx Colonic Polyps  (ICD-V12.72) 2)  Hip Pain  (ICD-719.45) 3)  Psa, Increased  (ICD-790.93) 4)  Loc Osteoarthros Not Spec Prim/sec Lower Leg  (ICD-715.36) 5)  Cad, Native Vessel  (ICD-414.01) 6)  Cad  (ICD-414.00) 7)  Hyperlipidemia  (ICD-272.4) 8)  Abnormal Cv (STRESS) Test  (ICD-794.39) 9)  Left Bundle Branch Block  (ICD-426.3) 10)  Preventive Health Care  (ICD-V70.0) 11)  Anxiety State, Unspecified  (ICD-300.00) 12)  Syncope  (ICD-780.2) 13)  Weight Loss, Recent  (ICD-783.21) 14)  Hepatomegaly  (ICD-789.1) 15)  Dysphagia Unspecified  (ICD-787.20) 16)  Anemia, B12 Deficiency  (ICD-281.1) 17)  Chronic Prostatitis  (ICD-601.1) 18)  Alcoholic Polyneuropathy  (ICD-357.5) 19)  Adhesive Capsulitis of Shoulder  (ICD-726.0) 20)  Carpal Tunnel Syndrome, Right  (ICD-354.0) 21)  Degenerative Disc Disease, Cervical Spine  (ICD-722.4) 22)  Family  History of Alcoholism/addiction  (ICD-V61.41) 23)  Family History of Cad Male 1st Degree Relative <50  (ICD-V17.3) 24)  Asthma  (ICD-493.90) 25)  Hypertension  (ICD-401.9) 26)  Abuse, Alcohol, in Remission  (ICD-305.03) 27)  Gerd  (ICD-530.81) 28)  Osteoarthritis  (ICD-715.90) 29)  Gout  (ICD-274.9) 30)  Hypothyroidism  (ICD-244.9) 31)  Sinusitis, Chronic Nos  (ICD-473.9)  Current Problems (verified): 1)  Personal Hx Colonic Polyps  (ICD-V12.72) 2)  Hip Pain  (ICD-719.45) 3)  Psa, Increased  (ICD-790.93) 4)  Loc Osteoarthros Not Spec Prim/sec Lower Leg  (ICD-715.36) 5)  Cad, Native Vessel  (ICD-414.01) 6)  Cad  (ICD-414.00) 7)  Hyperlipidemia  (ICD-272.4) 8)  Abnormal Cv (STRESS) Test  (ICD-794.39) 9)  Left Bundle Branch Block  (ICD-426.3) 10)  Preventive Health Care  (ICD-V70.0) 11)  Anxiety State, Unspecified  (ICD-300.00) 12)  Syncope  (ICD-780.2) 13)  Weight Loss, Recent  (ICD-783.21) 14)  Hepatomegaly  (ICD-789.1) 15)  Dysphagia Unspecified  (ICD-787.20) 16)  Anemia, B12 Deficiency  (ICD-281.1) 17)  Chronic Prostatitis  (ICD-601.1) 18)  Alcoholic Polyneuropathy  (ICD-357.5) 19)  Adhesive Capsulitis of Shoulder  (ICD-726.0) 20)  Carpal Tunnel Syndrome, Right  (ICD-354.0) 21)  Degenerative Disc Disease, Cervical Spine  (ICD-722.4) 22)  Family History of Alcoholism/addiction  (ICD-V61.41) 23)  Family History of Cad Male 1st Degree Relative <  50  (ICD-V17.3) 24)  Asthma  (ICD-493.90) 25)  Hypertension  (ICD-401.9) 26)  Abuse, Alcohol, in Remission  (ICD-305.03) 27)  Gerd  (ICD-530.81) 28)  Osteoarthritis  (ICD-715.90) 29)  Gout  (ICD-274.9) 30)  Hypothyroidism  (ICD-244.9) 31)  Sinusitis, Chronic Nos  (ICD-473.9)  Medications Prior to Update: 1)  Proair Hfa 108 (90 Base) Mcg/act Aers (Albuterol Sulfate) .... Use As Directed Prn 2)  Diovan 160 Mg Tabs (Valsartan) .... Take 1 Tablet By Mouth Once A Day 3)  Singulair 10 Mg Tabs (Montelukast Sodium) .... Take 1 Tablet By  Mouth Once A Day 4)  Synthroid 75 Mcg  Tabs (Levothyroxine Sodium) .... Once Daily 5)  Prilosec 20 Mg Cpdr (Omeprazole) .Marland Kitchen.. 1 Two Times A Day 6)  Metoprolol Tartrate 25 Mg Tabs (Metoprolol Tartrate) .Marland Kitchen.. 1 Two Times A Day 7)  Crestor 10 Mg Tabs (Rosuvastatin Calcium) .Marland Kitchen.. 1 Once Daily 8)  Baby Aspirin 81 Mg Chew (Aspirin) .Marland Kitchen.. 1 Tablet By Mouth Once Daily 9)  Diclofenac Sodium 75 Mg Tbec (Diclofenac Sodium) .Marland Kitchen.. 1 Once Daily As Needed 10)  Aleve 220 Mg Tabs (Naproxen Sodium) .... As Needed 11)  Alprazolam 0.25 Mg Tabs (Alprazolam) .Marland Kitchen.. 1 Every 6 Hours As Needed  Current Medications (verified): 1)  Proair Hfa 108 (90 Base) Mcg/act Aers (Albuterol Sulfate) .... Use As Directed Prn 2)  Diovan 160 Mg Tabs (Valsartan) .... Take 1 Tablet By Mouth Once A Day 3)  Singulair 10 Mg Tabs (Montelukast Sodium) .... Take 1 Tablet By Mouth Once A Day 4)  Synthroid 75 Mcg  Tabs (Levothyroxine Sodium) .... Once Daily 5)  Prilosec 20 Mg Cpdr (Omeprazole) .Marland Kitchen.. 1 Two Times A Day 6)  Metoprolol Tartrate 25 Mg Tabs (Metoprolol Tartrate) .Marland Kitchen.. 1 Two Times A Day 7)  Crestor 10 Mg Tabs (Rosuvastatin Calcium) .Marland Kitchen.. 1 Once Daily 8)  Baby Aspirin 81 Mg Chew (Aspirin) .Marland Kitchen.. 1 Tablet By Mouth Once Daily 9)  Diclofenac Sodium 75 Mg Tbec (Diclofenac Sodium) .Marland Kitchen.. 1 Once Daily As Needed 10)  Aleve 220 Mg Tabs (Naproxen Sodium) .... As Needed 11)  Alprazolam 0.25 Mg Tabs (Alprazolam) .Marland Kitchen.. 1 Every 6 Hours As Needed  Allergies (verified): 1)  ! Penicillin 2)  Amoxicillin (Amoxicillin)  Directives: 1)  Living Will With No Machines   Past History:  Family History: Last updated: 03/05/2009 Family History of CAD Male 1st degree relative <50 Family History of Alcoholism/Addiction Family History Other cancer mother Father died cancer Brother with MI in his 39s  Social History: Last updated: 09/25/2008 Retired Married Former Smoker stopped 4 years ago Alcohol use-no stopped 1 year ago Illicit Drug Use - no Patient  gets regular exercise.  Risk Factors: Alcohol Use: stopped (09/25/2010) Diet: reveiwed diet  (12/20/2009) Exercise: yes (12/20/2009)  Risk Factors: Smoking Status: quit (09/25/2010)  Past medical, surgical, family and social histories (including risk factors) reviewed, and no changes noted (except as noted below).  Past Medical History: Reviewed history from 10/14/2009 and no changes required. Hypothyroidism chronic sinusitis Gout Osteoarthritis GERD 24mm mass in left lobe of liver Hypertension Asthma/COPD alcohol abuse 303.03 LBBB CAD-70% mid LAD, medical management  Past Surgical History: Reviewed history from 08/21/2008 and no changes required. pulmonary colapse with chect tube Tonsillectomy Carpal tunnel release  Family History: Reviewed history from 03/05/2009 and no changes required. Family History of CAD Male 1st degree relative <50 Family History of Alcoholism/Addiction Family History Other cancer mother Father died cancer Brother with MI in his 31s  Social History: Reviewed history from  09/25/2008 and no changes required. Retired Married Former Smoker stopped 4 years ago Alcohol use-no stopped 1 year ago Illicit Drug Use - no Patient gets regular exercise.  Review of Systems  The patient denies anorexia, fever, weight loss, weight gain, vision loss, decreased hearing, hoarseness, chest pain, syncope, dyspnea on exertion, peripheral edema, prolonged cough, headaches, hemoptysis, abdominal pain, melena, hematochezia, severe indigestion/heartburn, hematuria, incontinence, genital sores, muscle weakness, suspicious skin lesions, transient blindness, difficulty walking, depression, unusual weight change, abnormal bleeding, enlarged lymph nodes, angioedema, and breast masses.         Flu Vaccine Consent Questions     Do you have a history of severe allergic reactions to this vaccine? no    Any prior history of allergic reactions to egg and/or gelatin? no     Do you have a sensitivity to the preservative Thimersol? no    Do you have a past history of Guillan-Barre Syndrome? no    Do you currently have an acute febrile illness? no    Have you ever had a severe reaction to latex? no    Vaccine information given and explained to patient? yes    Are you currently pregnant? no    Lot Number:[id number]   Exp Date:05/30/2011   Site Given  Left Deltoid IM   Physical Exam  General:  alert and well-hydrated.   Head:  Normocephalic and atraumatic.male-pattern balding.   Eyes:  PERRLA, no icterus.exam deferred to patient's ophthalmologist.   Ears:  R ear normal and L ear normal.   Nose:  no external deformity and no nasal discharge.   Mouth:  pharynx pink and moist and postnasal drip.   Neck:  No deformities, masses, or tenderness noted. Lungs:  normal respiratory effort and R base dullness.   Heart:  normal rate, regular rhythm, no gallop, and no rub.     Impression & Recommendations:  Problem # 1:  HYPERTENSION (ICD-401.9)  His updated medication list for this problem includes:    Diovan 160 Mg Tabs (Valsartan) .Marland Kitchen... Take 1 tablet by mouth once a day    Metoprolol Tartrate 25 Mg Tabs (Metoprolol tartrate) .Marland Kitchen... 1 two times a day  BP today: 130/80 Prior BP: 120/70 (06/19/2010)  Prior 10 Yr Risk Heart Disease: N/A (09/25/2009)  Labs Reviewed: K+: 4.5 (12/13/2009) Creat: : 0.82 (12/13/2009)   Chol: 168 (03/20/2010)   HDL: 54.00 (03/20/2010)   LDL: 63 (12/13/2009)   TG: 161 (12/13/2009)  Orders: Venipuncture (14782) TLB-BMP (Basic Metabolic Panel-BMET) (80048-METABOL)  Problem # 2:  PREVENTIVE HEALTH CARE (ICD-V70.0)  1000 steps a day to prent fall risk  Colonoscopy: Location:  [facility].   (03/09/2005) Td Booster: Historical (11/30/2005)   Flu Vax: Fluvax 3+ (09/25/2010)   Pneumovax: Pneumovax (02/11/2009) Chol: 168 (03/20/2010)   HDL: 54.00 (03/20/2010)   LDL: 63 (12/13/2009)   TG: 161 (12/13/2009) TSH: 0.60  (03/20/2010)   HgbA1C: 6.4 (11/19/2008)   PSA: 1.05 (03/20/2010) Next Colonoscopy due:: 02/2010 (03/09/2005)  Discussed using sunscreen, use of alcohol, drug use, self testicular exam, routine dental care, routine eye care, routine physical exam, seat belts, multiple vitamins, osteoporosis prevention, adequate calcium intake in diet, and recommendations for immunizations.  Discussed exercise and checking cholesterol.  Discussed gun safety, safe sex, and contraception. Also recommend checking PSA.  Problem # 3:  LOC OSTEOARTHROS NOT SPEC PRIM/SEC LOWER LEG (ICD-715.36)  Informed consent obtained and then both knee joint was prepped in a sterile manor and 40 mg depo and 1/2 cc 1% lidocaine  injected into the synovial space. After care discussed. Pt tolerated procedure well. bilateral knee  injections given today for severe OA of knee His updated medication list for this problem includes:    Baby Aspirin 81 Mg Chew (Aspirin) .Marland Kitchen... 1 tablet by mouth once daily    Diclofenac Sodium 75 Mg Tbec (Diclofenac sodium) .Marland Kitchen... 1 once daily as needed    Aleve 220 Mg Tabs (Naproxen sodium) .Marland Kitchen... As needed  Discussed use of medications, application of heat or cold, and exercises.   Orders: Joint Aspirate / Injection, Large (20610) Depo- Medrol 40mg  (J1030)  Complete Medication List: 1)  Proair Hfa 108 (90 Base) Mcg/act Aers (Albuterol sulfate) .... Use as directed prn 2)  Diovan 160 Mg Tabs (Valsartan) .... Take 1 tablet by mouth once a day 3)  Singulair 10 Mg Tabs (Montelukast sodium) .... Take 1 tablet by mouth once a day 4)  Synthroid 75 Mcg Tabs (Levothyroxine sodium) .... Once daily 5)  Prilosec 20 Mg Cpdr (Omeprazole) .Marland Kitchen.. 1 two times a day 6)  Metoprolol Tartrate 25 Mg Tabs (Metoprolol tartrate) .Marland Kitchen.. 1 two times a day 7)  Crestor 10 Mg Tabs (Rosuvastatin calcium) .Marland Kitchen.. 1 once daily 8)  Baby Aspirin 81 Mg Chew (Aspirin) .Marland Kitchen.. 1 tablet by mouth once daily 9)  Diclofenac Sodium 75 Mg Tbec (Diclofenac  sodium) .Marland Kitchen.. 1 once daily as needed 10)  Aleve 220 Mg Tabs (Naproxen sodium) .... As needed 11)  Alprazolam 0.25 Mg Tabs (Alprazolam) .Marland Kitchen.. 1 every 6 hours as needed  Other Orders: Flu Vaccine 24yrs + MEDICARE PATIENTS (Z6109) Administration Flu vaccine - MCR (G0008) TLB-TSH (Thyroid Stimulating Hormone) (60454-UJW)  Patient Instructions: 1)  Please schedule a follow-up appointment in 3 months. for CPX   Orders Added: 1)  Flu Vaccine 28yrs + MEDICARE PATIENTS [Q2039] 2)  Administration Flu vaccine - MCR [G0008] 3)  TLB-TSH (Thyroid Stimulating Hormone) [84443-TSH] 4)  Venipuncture [11914] 5)  TLB-BMP (Basic Metabolic Panel-BMET) [80048-METABOL] 6)  Est. Patient Level IV [78295] 7)  Joint Aspirate / Injection, Large [20610] 8)  Depo- Medrol 40mg  [J1030]     Appended Document: Orders Update     Clinical Lists Changes  Orders: Added new Service order of Specimen Handling (62130) - Signed

## 2011-01-01 NOTE — Assessment & Plan Note (Signed)
Summary: 6 month rov/sl  Medications Added PROAIR HFA 108 (90 BASE) MCG/ACT AERS (ALBUTEROL SULFATE) Use as directed PRN DIOVAN 160 MG TABS (VALSARTAN) Take 1 tablet by mouth once a day some days he miss DIOVAN 160 MG TABS (VALSARTAN) Take 1 tablet by mouth once a day        Visit Type:  6 mos Referring Provider:  n/a Primary Provider:  Darryll Capers MD  CC:  No cardiac complaints.  History of Present Illness: 77 yo WM with past medical history significant for HTN, anemia, asthma/COPD, GERD, OA, gout hypothyroidism, hiatal hernia and alcohol abuse seen as a new patient 03/05/09 for further evaluation of atypical chest pain, new LBBB and abnormal Myoview. Cardiac cath was performed on 03/13/09 and we found a 70-80% heavily calcified mid LAD that was severely diseased throughout the mid section with heavy calcification. There was diffuse moderately obstructive disease in the other vessels as outlined below. I discussed his disease with him in the hospital and we elected for medical management given that he was asymptomatic.   He returns today for follow up. He has been doing well. He has been working around the yard in the garden and in the house without any chest pain. He worked for seven hours yesterday in the yard and felt great.  His breathing has been at baseline.  He tells me that he feels great.  No dizziness, near syncope or syncope. He has been missing doses of Diovan.  Problems Prior to Update: 1)  Personal Hx Colonic Polyps  (ICD-V12.72) 2)  Hip Pain  (ICD-719.45) 3)  Psa, Increased  (ICD-790.93) 4)  Loc Osteoarthros Not Spec Prim/sec Lower Leg  (ICD-715.36) 5)  Cad, Native Vessel  (ICD-414.01) 6)  Cad  (ICD-414.00) 7)  Hyperlipidemia  (ICD-272.4) 8)  Abnormal Cv (STRESS) Test  (ICD-794.39) 9)  Left Bundle Branch Block  (ICD-426.3) 10)  Preventive Health Care  (ICD-V70.0) 11)  Anxiety State, Unspecified  (ICD-300.00) 12)  Syncope  (ICD-780.2) 13)  Weight Loss, Recent   (ICD-783.21) 14)  Hepatomegaly  (ICD-789.1) 15)  Dysphagia Unspecified  (ICD-787.20) 16)  Anemia, B12 Deficiency  (ICD-281.1) 17)  Chronic Prostatitis  (ICD-601.1) 18)  Alcoholic Polyneuropathy  (ICD-357.5) 19)  Adhesive Capsulitis of Shoulder  (ICD-726.0) 20)  Carpal Tunnel Syndrome, Right  (ICD-354.0) 21)  Degenerative Disc Disease, Cervical Spine  (ICD-722.4) 22)  Family History of Alcoholism/addiction  (ICD-V61.41) 23)  Family History of Cad Male 1st Degree Relative <50  (ICD-V17.3) 24)  Asthma  (ICD-493.90) 25)  Hypertension  (ICD-401.9) 26)  Abuse, Alcohol, in Remission  (ICD-305.03) 27)  Gerd  (ICD-530.81) 28)  Osteoarthritis  (ICD-715.90) 29)  Gout  (ICD-274.9) 30)  Hypothyroidism  (ICD-244.9) 31)  Sinusitis, Chronic Nos  (ICD-473.9)  Current Medications (verified): 1)  Proair Hfa 108 (90 Base) Mcg/act Aers (Albuterol Sulfate) .... Use As Directed Prn 2)  Diovan 160 Mg Tabs (Valsartan) .... Take 1 Tablet By Mouth Once A Day Some Days He Miss 3)  Singulair 10 Mg Tabs (Montelukast Sodium) .... Take 1 Tablet By Mouth Once A Day 4)  Synthroid 75 Mcg  Tabs (Levothyroxine Sodium) .... Once Daily 5)  Prilosec 20 Mg Cpdr (Omeprazole) .Marland Kitchen.. 1 Two Times A Day 6)  Metoprolol Tartrate 25 Mg Tabs (Metoprolol Tartrate) .Marland Kitchen.. 1 Two Times A Day 7)  Crestor 10 Mg Tabs (Rosuvastatin Calcium) .Marland Kitchen.. 1 Once Daily 8)  Baby Aspirin 81 Mg Chew (Aspirin) .Marland Kitchen.. 1 Tablet By Mouth Once Daily 9)  Diclofenac Sodium 75 Mg  Tbec (Diclofenac Sodium) .Marland Kitchen.. 1 Once Daily As Needed 10)  Aleve 220 Mg Tabs (Naproxen Sodium) .... As Needed  Allergies: 1)  ! Penicillin 2)  Amoxicillin (Amoxicillin)  Past History:  Past Medical History: Reviewed history from 10/14/2009 and no changes required. Hypothyroidism chronic sinusitis Gout Osteoarthritis GERD 24mm mass in left lobe of liver Hypertension Asthma/COPD alcohol abuse 303.03 LBBB CAD-70% mid LAD, medical management  Social History: Reviewed history  from 09/25/2008 and no changes required. Retired Married Former Smoker stopped 4 years ago Alcohol use-no stopped 1 year ago Illicit Drug Use - no Patient gets regular exercise.  Review of Systems  The patient denies fatigue, malaise, fever, weight gain/loss, vision loss, decreased hearing, hoarseness, chest pain, palpitations, shortness of breath, prolonged cough, wheezing, sleep apnea, coughing up blood, abdominal pain, blood in stool, nausea, vomiting, diarrhea, heartburn, incontinence, blood in urine, muscle weakness, joint pain, leg swelling, rash, skin lesions, headache, fainting, dizziness, depression, anxiety, enlarged lymph nodes, easy bruising or bleeding, and environmental allergies.    Vital Signs:  Patient profile:   77 year old male Height:      68 inches Weight:      140 pounds BMI:     21.36 Pulse rate:   55 / minute BP sitting:   144 / 66  (left arm) Cuff size:   large  Vitals Entered By: Oswald Hillock (Apr 01, 2010 9:08 AM)  Physical Exam  General:  General: Well developed, well nourished, NAD Musculoskeletal: Muscle strength 5/5 all ext Psychiatric: Mood and affect normal Neck: No JVD, no carotid bruits, no thyromegaly, no lymphadenopathy. Lungs:Clear bilaterally, no wheezes, rhonci, crackles CV: RRR no murmurs, gallops rubs Abdomen: soft, NT, ND, BS present Extremities: No edema, pulses 2+.    EKG  Procedure date:  04/01/2010  Findings:      Sinus bradycardia, rate 53 bpm. 1st degree AV block.   Impression & Recommendations:  Problem # 1:  CAD, NATIVE VESSEL (ICD-414.01) Stable. Continue medical management. He has moderate disease but has done well without intervention.   His updated medication list for this problem includes:    Metoprolol Tartrate 25 Mg Tabs (Metoprolol tartrate) .Marland Kitchen... 1 two times a day    Baby Aspirin 81 Mg Chew (Aspirin) .Marland Kitchen... 1 tablet by mouth once daily  Problem # 2:  HYPERTENSION (ICD-401.9) Slightly elevated today  and when seen by Dr. Lovell Sheehan. He has been missing doses of Diovan. I have asked him to take this every day.   His updated medication list for this problem includes:    Diovan 160 Mg Tabs (Valsartan) .Marland Kitchen... Take 1 tablet by mouth once a day    Metoprolol Tartrate 25 Mg Tabs (Metoprolol tartrate) .Marland Kitchen... 1 two times a day    Baby Aspirin 81 Mg Chew (Aspirin) .Marland Kitchen... 1 tablet by mouth once daily  Problem # 3:  HYPERLIPIDEMIA (ICD-272.4) Well controlled on check in January 2011. LDL 63. HDL 43.  Per Dr. Lovell Sheehan.   His updated medication list for this problem includes:    Crestor 10 Mg Tabs (Rosuvastatin calcium) .Marland Kitchen... 1 once daily  Other Orders: EKG w/ Interpretation (93000)  Patient Instructions: 1)  Your physician recommends that you schedule a follow-up appointment in: 1year 2)  Your physician recommends that you continue on your current medications as directed. Please refer to the Current Medication list given to you today.  Be sure to take all your medicines as prescribed

## 2011-01-01 NOTE — Assessment & Plan Note (Signed)
Summary: 3 month rov/njr   Vital Signs:  Patient profile:   77 year old male Height:      68 inches Weight:      147 pounds Temp:     97.9 degrees F oral Pulse rate:   64 / minute Resp:     14 per minute BP sitting:   130 / 70  (left arm)  Vitals Entered By: Willy Eddy, LPN (December 20, 2009 10:19 AM) CC: roa labs   Primary Care Provider:  Darryll Capers MD  CC:  roa labs.  History of Present Illness: preventativre care visit and problem focus exam  Reveiwed depression, alcohol use, diet and vision screening ( nhas an appointment with southeaster this month) reviewed end of like planning ( living will : no machines) reveiwed all immunization and prevention protocols and set plan in place  Preventive Screening-Counseling & Management  Alcohol-Tobacco     Alcohol drinks/day: stopped     Smoking Status: quit     Year Quit: 2002  Caffeine-Diet-Exercise     Diet Comments: reveiwed diet      Does Patient Exercise: yes     [facility] Depression Score: not depressed     Depression Counseling: n/a  Problems Prior to Update: 1)  Cad, Native Vessel  (ICD-414.01) 2)  Cad  (ICD-414.00) 3)  Hyperlipidemia  (ICD-272.4) 4)  Abnormal Cv (STRESS) Test  (ICD-794.39) 5)  Left Bundle Branch Block  (ICD-426.3) 6)  Preventive Health Care  (ICD-V70.0) 7)  Anxiety State, Unspecified  (ICD-300.00) 8)  Syncope  (ICD-780.2) 9)  Weight Loss, Recent  (ICD-783.21) 10)  Hepatomegaly  (ICD-789.1) 11)  Dysphagia Unspecified  (ICD-787.20) 12)  Anemia, B12 Deficiency  (ICD-281.1) 13)  Chronic Prostatitis  (ICD-601.1) 14)  Alcoholic Polyneuropathy  (ICD-357.5) 15)  Adhesive Capsulitis of Shoulder  (ICD-726.0) 16)  Carpal Tunnel Syndrome, Right  (ICD-354.0) 17)  Degenerative Disc Disease, Cervical Spine  (ICD-722.4) 18)  Family History of Alcoholism/addiction  (ICD-V61.41) 19)  Family History of Cad Male 1st Degree Relative <50  (ICD-V17.3) 20)  Asthma  (ICD-493.90) 21)  Hypertension   (ICD-401.9) 22)  Abuse, Alcohol, in Remission  (ICD-305.03) 23)  Gerd  (ICD-530.81) 24)  Osteoarthritis  (ICD-715.90) 25)  Gout  (ICD-274.9) 26)  Hypothyroidism  (ICD-244.9) 27)  Sinusitis, Chronic Nos  (ICD-473.9)  Medications Prior to Update: 1)  Proair Hfa 108 (90 Base) Mcg/act Aers (Albuterol Sulfate) .... Use As Directed 2)  Diovan 160 Mg Tabs (Valsartan) .... Take 1 Tablet By Mouth Once A Day 3)  Singulair 10 Mg Tabs (Montelukast Sodium) .... Take 1 Tablet By Mouth Once A Day 4)  Synthroid 75 Mcg  Tabs (Levothyroxine Sodium) .... Once Daily 5)  Prilosec 20 Mg Cpdr (Omeprazole) .Marland Kitchen.. 1 Two Times A Day 6)  Metoprolol Tartrate 25 Mg Tabs (Metoprolol Tartrate) .Marland Kitchen.. 1 Two Times A Day 7)  Crestor 10 Mg Tabs (Rosuvastatin Calcium) .Marland Kitchen.. 1 Once Daily 8)  Baby Aspirin 81 Mg Chew (Aspirin) .Marland Kitchen.. 1 Tablet By Mouth Once Daily 9)  Diclofenac Sodium 75 Mg Tbec (Diclofenac Sodium) .Marland Kitchen.. 1 Once Daily As Needed  Current Medications (verified): 1)  Proair Hfa 108 (90 Base) Mcg/act Aers (Albuterol Sulfate) .... Use As Directed 2)  Diovan 160 Mg Tabs (Valsartan) .... Take 1 Tablet By Mouth Once A Day 3)  Singulair 10 Mg Tabs (Montelukast Sodium) .... Take 1 Tablet By Mouth Once A Day 4)  Synthroid 75 Mcg  Tabs (Levothyroxine Sodium) .... Once Daily 5)  Prilosec 20  Mg Cpdr (Omeprazole) .Marland Kitchen.. 1 Two Times A Day 6)  Metoprolol Tartrate 25 Mg Tabs (Metoprolol Tartrate) .Marland Kitchen.. 1 Two Times A Day 7)  Crestor 10 Mg Tabs (Rosuvastatin Calcium) .Marland Kitchen.. 1 Once Daily 8)  Baby Aspirin 81 Mg Chew (Aspirin) .Marland Kitchen.. 1 Tablet By Mouth Once Daily 9)  Diclofenac Sodium 75 Mg Tbec (Diclofenac Sodium) .Marland Kitchen.. 1 Once Daily As Needed  Allergies (verified): 1)  ! Penicillin 2)  Amoxicillin (Amoxicillin)  Directives (verified): 1)  Living Will With No Machines   Past History:  Family History: Last updated: 03/05/2009 Family History of CAD Male 1st degree relative <50 Family History of Alcoholism/Addiction Family History Other  cancer mother Father died cancer Brother with MI in his 5s  Social History: Last updated: 09/25/2008 Retired Married Former Smoker stopped 4 years ago Alcohol use-no stopped 1 year ago Illicit Drug Use - no Patient gets regular exercise.  Risk Factors: Alcohol Use: stopped (12/20/2009) Diet: reveiwed diet  (12/20/2009) Exercise: yes (12/20/2009)  Risk Factors: Smoking Status: quit (12/20/2009)  Past medical, surgical, family and social histories (including risk factors) reviewed, and no changes noted (except as noted below).  Past Medical History: Reviewed history from 10/14/2009 and no changes required. Hypothyroidism chronic sinusitis Gout Osteoarthritis GERD 24mm mass in left lobe of liver Hypertension Asthma/COPD alcohol abuse 303.03 LBBB CAD-70% mid LAD, medical management  Past Surgical History: Reviewed history from 08/21/2008 and no changes required. pulmonary colapse with chect tube Tonsillectomy Carpal tunnel release  Family History: Reviewed history from 03/05/2009 and no changes required. Family History of CAD Male 1st degree relative <50 Family History of Alcoholism/Addiction Family History Other cancer mother Father died cancer Brother with MI in his 42s  Social History: Reviewed history from 09/25/2008 and no changes required. Retired Married Former Smoker stopped 4 years ago Alcohol use-no stopped 1 year ago Illicit Drug Use - no Patient gets regular exercise.   Impression & Recommendations:  Problem # 1:  PREVENTIVE HEALTH CARE (ICD-V70.0)  preventative care medicare exame reviewed consultants [facility] urology) ( Cardiology) reveiwed all labs and screenings peterson to do PSA colon due in April and will be set up Colonoscopy: Location:  [facility].   (03/09/2005) Td Booster: Historical (11/30/2005)   Flu Vax: Fluvax 3+ (09/25/2009)   Pneumovax: Pneumovax (02/11/2009) Chol: 138 (12/13/2009)   HDL: 43  (12/13/2009)   LDL: 63 (12/13/2009)   TG: 161 (12/13/2009) TSH: 0.91 (12/13/2009)   HgbA1C: 6.4 (11/19/2008)   PSA: 5.05 (02/11/2009) Next Colonoscopy due:: 02/2010 (03/09/2005)  Discussed using sunscreen, use of alcohol, drug use, self testicular exam, routine dental care, routine eye care, routine physical exam, seat belts, multiple vitamins, osteoporosis prevention, adequate calcium intake in diet, and recommendations for immunizations.  Discussed exercise and checking cholesterol.  Discussed gun safety, safe sex, and contraception. Also recommend checking PSA.  Orders: EMR Misc Charge Code [facility])  Complete Medication List: 1)  Proair Hfa 108 (90 Base) Mcg/act Aers (Albuterol sulfate) .... Use as directed 2)  Diovan 160 Mg Tabs (Valsartan) .... Take 1 tablet by mouth once a day 3)  Singulair 10 Mg Tabs (Montelukast sodium) .... Take 1 tablet by mouth once a day 4)  Synthroid 75 Mcg Tabs (Levothyroxine sodium) .... Once daily 5)  Prilosec 20 Mg Cpdr (Omeprazole) .Marland Kitchen.. 1 two times a day 6)  Metoprolol Tartrate 25 Mg Tabs (Metoprolol tartrate) .Marland Kitchen.. 1 two times a day 7)  Crestor 10 Mg Tabs (Rosuvastatin calcium) .Marland Kitchen.. 1 once daily 8)  Baby Aspirin 81  Mg Chew (Aspirin) .Marland Kitchen.. 1 tablet by mouth once daily 9)  Diclofenac Sodium 75 Mg Tbec (Diclofenac sodium) .Marland Kitchen.. 1 once daily as needed   Prevention & Chronic Care Immunizations   Influenza vaccine: Fluvax 3+  (09/25/2009)   Influenza vaccine due: 07/31/2010    Tetanus booster: 11/30/2005: Historical   Tetanus booster due: 12/01/2015    Pneumococcal vaccine: Pneumovax  (02/11/2009)   Pneumococcal vaccine due: 02/11/2014    H. zoster vaccine: Not documented   H. zoster vaccine deferral: Deferred  (12/20/2009)  Colorectal Screening   Hemoccult: Not documented    Colonoscopy: Location:  [facility].    (03/09/2005)   Colonoscopy due: 02/2010  Other Screening   PSA: 5.05  (02/11/2009)   PSA action/deferral: Not indicated   (12/20/2009)   PSA due due: 02/11/2010   Smoking status: quit  (12/20/2009)  Lipids   Total Cholesterol: 138  (12/13/2009)   LDL: 63  (12/13/2009)   LDL Direct: 72.7  (06/26/2009)   HDL: 43  (12/13/2009)   Triglycerides: 161  (12/13/2009)   Lipid panel due: 06/19/2010    SGOT (AST): 22  (12/13/2009)   SGPT (ALT): 18  (12/13/2009)   Alkaline phosphatase: 79  (12/13/2009)   Total bilirubin: 0.5  (12/13/2009)    Lipid flowsheet reviewed?: Yes   Progress toward LDL goal: At goal    Stage of readiness to change (lipid management): Maintenance  Hypertension   Last Blood Pressure: 130 / 70  (12/20/2009)   Serum creatinine: 0.82  (12/13/2009)   BMP action: Not indicated   Serum potassium 4.5  (12/13/2009)   Basic metabolic panel due: 06/19/2010    Hypertension flowsheet reviewed?: Yes   Progress toward BP goal: Unchanged  Self-Management Support :    Hypertension self-management support: Not documented    Lipid self-management support: Not documented     Appended Document: 3 month rov/njr     Primary Care Provider:  Darryll Capers MD   History of Present Illness: the pt presents for monitering of the lipids he has cut out ice cream and all ETOH  Hyperlipidemia Follow-Up      This is an 77 year old man who presents for Hyperlipidemia follow-up.  The patient denies muscle aches, GI upset, abdominal pain, flushing, itching, constipation, diarrhea, and fatigue.  The patient denies the following symptoms: chest pain/pressure, exercise intolerance, dypsnea, palpitations, syncope, and pedal edema.  Compliance with medications (by patient report) has been near 100%.  Dietary compliance has been good.  The patient reports exercising 3-4X per week.    Allergies: 1)  ! Penicillin 2)  Amoxicillin (Amoxicillin)  Physical Exam  General:  alert and well-hydrated.   Head:  Normocephalic and atraumatic.male-pattern balding.   Neck:  No deformities, masses, or tenderness  noted. Lungs:  normal respiratory effort and R base dullness.   Heart:  normal rate, regular rhythm, no gallop, and no rub.   Abdomen:  soft, non-tender, no guarding, and no splenomegaly.     Impression & Recommendations:  Problem # 1:  HYPERLIPIDEMIA (ICD-272.4) Assessment Improved at goals and refill meds today His updated medication list for this problem includes:    Crestor 10 Mg Tabs (Rosuvastatin calcium) .Marland Kitchen... 1 once daily  Labs Reviewed: SGOT: 22 (12/13/2009)   SGPT: 18 (12/13/2009)  Lipid Goals: Chol Goal: 200 (09/25/2009)   HDL Goal: 40 (09/25/2009)   LDL Goal: 100 (09/25/2009)   TG Goal: 150 (09/25/2009)  Prior 10 Yr Risk Heart Disease: N/A (09/25/2009)   HDL:43 (  12/13/2009), 38.60 (06/26/2009)  LDL:63 (12/13/2009), 118 (16/08/9603)  Chol:138 (12/13/2009), 141 (06/26/2009)  Trig:161 (12/13/2009), 139 (02/11/2009)  Complete Medication List: 1)  Proair Hfa 108 (90 Base) Mcg/act Aers (Albuterol sulfate) .... Use as directed 2)  Diovan 160 Mg Tabs (Valsartan) .... Take 1 tablet by mouth once a day 3)  Singulair 10 Mg Tabs (Montelukast sodium) .... Take 1 tablet by mouth once a day 4)  Synthroid 75 Mcg Tabs (Levothyroxine sodium) .... Once daily 5)  Prilosec 20 Mg Cpdr (Omeprazole) .Marland Kitchen.. 1 two times a day 6)  Metoprolol Tartrate 25 Mg Tabs (Metoprolol tartrate) .Marland Kitchen.. 1 two times a day 7)  Crestor 10 Mg Tabs (Rosuvastatin calcium) .Marland Kitchen.. 1 once daily 8)  Baby Aspirin 81 Mg Chew (Aspirin) .Marland Kitchen.. 1 tablet by mouth once daily 9)  Diclofenac Sodium 75 Mg Tbec (Diclofenac sodium) .Marland Kitchen.. 1 once daily as needed

## 2011-01-01 NOTE — Assessment & Plan Note (Signed)
Summary: 3 month rov/njr   Vital Signs:  Patient profile:   77 year old male Height:      68 inches Weight:      148 pounds BMI:     22.58 Temp:     98.2 degrees F oral Pulse rate:   60 / minute Resp:     14 per minute BP sitting:   120 / 70  (left arm)  Vitals Entered By: Willy Eddy, LPN (June 19, 2010 9:57 AM) CC: roa Is Patient Diabetic? No   Primary Care Provider:  Darryll Capers MD  CC:  roa.  History of Present Illness: follow up of blood pressure watching heat... he has a garden he has COPD but has been improved has seen the urologist and will see Dr Wilson Singer at [facility]   Asthma History    Initial Asthma Severity Rating:    Age range: 12+ years    Symptoms: 0-2 days/week    Nighttime Awakenings: 0-2/month    Interferes w/ normal activity: minor limitations    Exacerbations requiring oral systemic steroids: 0-1/year    Asthma Severity Assessment: Mild Persistent   Preventive Screening-Counseling & Management  Alcohol-Tobacco     Alcohol drinks/day: stopped     Smoking Status: quit     Year Quit: 2002  Problems Prior to Update: 1)  Personal Hx Colonic Polyps  (ICD-V12.72) 2)  Hip Pain  (ICD-719.45) 3)  Psa, Increased  (ICD-790.93) 4)  Loc Osteoarthros Not Spec Prim/sec Lower Leg  (ICD-715.36) 5)  Cad, Native Vessel  (ICD-414.01) 6)  Cad  (ICD-414.00) 7)  Hyperlipidemia  (ICD-272.4) 8)  Abnormal Cv (STRESS) Test  (ICD-794.39) 9)  Left Bundle Branch Block  (ICD-426.3) 10)  Preventive Health Care  (ICD-V70.0) 11)  Anxiety State, Unspecified  (ICD-300.00) 12)  Syncope  (ICD-780.2) 13)  Weight Loss, Recent  (ICD-783.21) 14)  Hepatomegaly  (ICD-789.1) 15)  Dysphagia Unspecified  (ICD-787.20) 16)  Anemia, B12 Deficiency  (ICD-281.1) 17)  Chronic Prostatitis  (ICD-601.1) 18)  Alcoholic Polyneuropathy  (ICD-357.5) 19)  Adhesive Capsulitis of Shoulder  (ICD-726.0) 20)  Carpal Tunnel Syndrome, Right  (ICD-354.0) 21)  Degenerative Disc Disease, Cervical  Spine  (ICD-722.4) 22)  Family History of Alcoholism/addiction  (ICD-V61.41) 23)  Family History of Cad Male 1st Degree Relative <50  (ICD-V17.3) 24)  Asthma  (ICD-493.90) 25)  Hypertension  (ICD-401.9) 26)  Abuse, Alcohol, in Remission  (ICD-305.03) 27)  Gerd  (ICD-530.81) 28)  Osteoarthritis  (ICD-715.90) 29)  Gout  (ICD-274.9) 30)  Hypothyroidism  (ICD-244.9) 31)  Sinusitis, Chronic Nos  (ICD-473.9)  Current Problems (verified): 1)  Personal Hx Colonic Polyps  (ICD-V12.72) 2)  Hip Pain  (ICD-719.45) 3)  Psa, Increased  (ICD-790.93) 4)  Loc Osteoarthros Not Spec Prim/sec Lower Leg  (ICD-715.36) 5)  Cad, Native Vessel  (ICD-414.01) 6)  Cad  (ICD-414.00) 7)  Hyperlipidemia  (ICD-272.4) 8)  Abnormal Cv (STRESS) Test  (ICD-794.39) 9)  Left Bundle Branch Block  (ICD-426.3) 10)  Preventive Health Care  (ICD-V70.0) 11)  Anxiety State, Unspecified  (ICD-300.00) 12)  Syncope  (ICD-780.2) 13)  Weight Loss, Recent  (ICD-783.21) 14)  Hepatomegaly  (ICD-789.1) 15)  Dysphagia Unspecified  (ICD-787.20) 16)  Anemia, B12 Deficiency  (ICD-281.1) 17)  Chronic Prostatitis  (ICD-601.1) 18)  Alcoholic Polyneuropathy  (ICD-357.5) 19)  Adhesive Capsulitis of Shoulder  (ICD-726.0) 20)  Carpal Tunnel Syndrome, Right  (ICD-354.0) 21)  Degenerative Disc Disease, Cervical Spine  (ICD-722.4) 22)  Family History of Alcoholism/addiction  (ICD-V61.41) 23)  Family History of Cad Male 1st Degree Relative <50  (ICD-V17.3) 24)  Asthma  (ICD-493.90) 25)  Hypertension  (ICD-401.9) 26)  Abuse, Alcohol, in Remission  (ICD-305.03) 27)  Gerd  (ICD-530.81) 28)  Osteoarthritis  (ICD-715.90) 29)  Gout  (ICD-274.9) 30)  Hypothyroidism  (ICD-244.9) 31)  Sinusitis, Chronic Nos  (ICD-473.9)  Medications Prior to Update: 1)  Proair Hfa 108 (90 Base) Mcg/act Aers (Albuterol Sulfate) .... Use As Directed Prn 2)  Diovan 160 Mg Tabs (Valsartan) .... Take 1 Tablet By Mouth Once A Day 3)  Singulair 10 Mg Tabs (Montelukast  Sodium) .... Take 1 Tablet By Mouth Once A Day 4)  Synthroid 75 Mcg  Tabs (Levothyroxine Sodium) .... Once Daily 5)  Prilosec 20 Mg Cpdr (Omeprazole) .Marland Kitchen.. 1 Two Times A Day 6)  Metoprolol Tartrate 25 Mg Tabs (Metoprolol Tartrate) .Marland Kitchen.. 1 Two Times A Day 7)  Crestor 10 Mg Tabs (Rosuvastatin Calcium) .Marland Kitchen.. 1 Once Daily 8)  Baby Aspirin 81 Mg Chew (Aspirin) .Marland Kitchen.. 1 Tablet By Mouth Once Daily 9)  Diclofenac Sodium 75 Mg Tbec (Diclofenac Sodium) .Marland Kitchen.. 1 Once Daily As Needed 10)  Aleve 220 Mg Tabs (Naproxen Sodium) .... As Needed  Current Medications (verified): 1)  Proair Hfa 108 (90 Base) Mcg/act Aers (Albuterol Sulfate) .... Use As Directed Prn 2)  Diovan 160 Mg Tabs (Valsartan) .... Take 1 Tablet By Mouth Once A Day 3)  Singulair 10 Mg Tabs (Montelukast Sodium) .... Take 1 Tablet By Mouth Once A Day 4)  Synthroid 75 Mcg  Tabs (Levothyroxine Sodium) .... Once Daily 5)  Prilosec 20 Mg Cpdr (Omeprazole) .Marland Kitchen.. 1 Two Times A Day 6)  Metoprolol Tartrate 25 Mg Tabs (Metoprolol Tartrate) .Marland Kitchen.. 1 Two Times A Day 7)  Crestor 10 Mg Tabs (Rosuvastatin Calcium) .Marland Kitchen.. 1 Once Daily 8)  Baby Aspirin 81 Mg Chew (Aspirin) .Marland Kitchen.. 1 Tablet By Mouth Once Daily 9)  Diclofenac Sodium 75 Mg Tbec (Diclofenac Sodium) .Marland Kitchen.. 1 Once Daily As Needed 10)  Aleve 220 Mg Tabs (Naproxen Sodium) .... As Needed  Allergies (verified): 1)  ! Penicillin 2)  Amoxicillin (Amoxicillin)  Directives: 1)  Living Will With No Machines   Past History:  Family History: Last updated: 03/05/2009 Family History of CAD Male 1st degree relative <50 Family History of Alcoholism/Addiction Family History Other cancer mother Father died cancer Brother with MI in his 52s  Social History: Last updated: 09/25/2008 Retired Married Former Smoker stopped 4 years ago Alcohol use-no stopped 1 year ago Illicit Drug Use - no Patient gets regular exercise.  Risk Factors: Alcohol Use: stopped (06/19/2010) Diet: reveiwed diet   (12/20/2009) Exercise: yes (12/20/2009)  Risk Factors: Smoking Status: quit (06/19/2010)  Past medical, surgical, family and social histories (including risk factors) reviewed, and no changes noted (except as noted below).  Past Medical History: Reviewed history from 10/14/2009 and no changes required. Hypothyroidism chronic sinusitis Gout Osteoarthritis GERD 24mm mass in left lobe of liver Hypertension Asthma/COPD alcohol abuse 303.03 LBBB CAD-70% mid LAD, medical management  Past Surgical History: Reviewed history from 08/21/2008 and no changes required. pulmonary colapse with chect tube Tonsillectomy Carpal tunnel release  Family History: Reviewed history from 03/05/2009 and no changes required. Family History of CAD Male 1st degree relative <50 Family History of Alcoholism/Addiction Family History Other cancer mother Father died cancer Brother with MI in his 82s  Social History: Reviewed history from 09/25/2008 and no changes required. Retired Married Former Smoker stopped 4 years ago Alcohol use-no stopped 1 year ago Illicit  Drug Use - no Patient gets regular exercise.  Review of Systems  The patient denies anorexia, fever, weight loss, weight gain, vision loss, decreased hearing, hoarseness, chest pain, syncope, dyspnea on exertion, peripheral edema, prolonged cough, headaches, hemoptysis, abdominal pain, melena, hematochezia, severe indigestion/heartburn, hematuria, incontinence, genital sores, muscle weakness, suspicious skin lesions, transient blindness, difficulty walking, depression, unusual weight change, abnormal bleeding, enlarged lymph nodes, angioedema, breast masses, and testicular masses.    Physical Exam  General:  alert and well-hydrated.   Head:  Normocephalic and atraumatic.male-pattern balding.   Eyes:  PERRLA, no icterus.exam deferred to patient's ophthalmologist.   Ears:  R ear normal and L ear normal.   Nose:  no external deformity and  no nasal discharge.   Neck:  No deformities, masses, or tenderness noted. Lungs:  normal respiratory effort and R base dullness.   Heart:  normal rate, regular rhythm, no gallop, and no rub.   Abdomen:  soft, non-tender, no guarding, and no splenomegaly.   Msk:  no joint swelling, no joint warmth, and no redness over joints.   Extremities:  trace left pedal edema and trace right pedal edema.   Neurologic:  alert & oriented X3 and finger-to-nose normal.     Impression & Recommendations:  Problem # 1:  CAD, NATIVE VESSEL (ICD-414.01)  His updated medication list for this problem includes:    Diovan 160 Mg Tabs (Valsartan) .Marland Kitchen... Take 1 tablet by mouth once a day    Metoprolol Tartrate 25 Mg Tabs (Metoprolol tartrate) .Marland Kitchen... 1 two times a day    Baby Aspirin 81 Mg Chew (Aspirin) .Marland Kitchen... 1 tablet by mouth once daily  Labs Reviewed: Chol: 168 (03/20/2010)   HDL: 54.00 (03/20/2010)   LDL: 63 (12/13/2009)   TG: 161 (12/13/2009)  Lipid Goals: Chol Goal: 200 (09/25/2009)   HDL Goal: 40 (09/25/2009)   LDL Goal: 100 (09/25/2009)   TG Goal: 150 (09/25/2009)  Problem # 2:  HYPERLIPIDEMIA (ICD-272.4)  His updated medication list for this problem includes:    Crestor 10 Mg Tabs (Rosuvastatin calcium) .Marland Kitchen... 1 once daily  Labs Reviewed: SGOT: 22 (12/13/2009)   SGPT: 18 (12/13/2009)  Lipid Goals: Chol Goal: 200 (09/25/2009)   HDL Goal: 40 (09/25/2009)   LDL Goal: 100 (09/25/2009)   TG Goal: 150 (09/25/2009)  Prior 10 Yr Risk Heart Disease: N/A (09/25/2009)   HDL:54.00 (03/20/2010), 43 (12/13/2009)  LDL:63 (12/13/2009), 118 (16/08/9603)  Chol:168 (03/20/2010), 138 (12/13/2009)  Trig:161 (12/13/2009), 139 (02/11/2009)  Problem # 3:  ASTHMA (ICD-493.90)  His updated medication list for this problem includes:    Proair Hfa 108 (90 Base) Mcg/act Aers (Albuterol sulfate) ..... Use as directed prn    Singulair 10 Mg Tabs (Montelukast sodium) .Marland Kitchen... Take 1 tablet by mouth once a day  Complete  Medication List: 1)  Proair Hfa 108 (90 Base) Mcg/act Aers (Albuterol sulfate) .... Use as directed prn 2)  Diovan 160 Mg Tabs (Valsartan) .... Take 1 tablet by mouth once a day 3)  Singulair 10 Mg Tabs (Montelukast sodium) .... Take 1 tablet by mouth once a day 4)  Synthroid 75 Mcg Tabs (Levothyroxine sodium) .... Once daily 5)  Prilosec 20 Mg Cpdr (Omeprazole) .Marland Kitchen.. 1 two times a day 6)  Metoprolol Tartrate 25 Mg Tabs (Metoprolol tartrate) .Marland Kitchen.. 1 two times a day 7)  Crestor 10 Mg Tabs (Rosuvastatin calcium) .Marland Kitchen.. 1 once daily 8)  Baby Aspirin 81 Mg Chew (Aspirin) .Marland Kitchen.. 1 tablet by mouth once daily 9)  Diclofenac Sodium 75 Mg  Tbec (Diclofenac sodium) .Marland Kitchen.. 1 once daily as needed 10)  Aleve 220 Mg Tabs (Naproxen sodium) .... As needed  Patient Instructions: 1)  Please schedule a follow-up appointment in 3 months.

## 2011-01-01 NOTE — Assessment & Plan Note (Signed)
Summary: consult before col age 77...em    History of Present Illness Visit Type: Follow-up Visit Primary GI MD: Sheryn Bison MD FACP FAGA Primary Provider: Darryll Capers MD Requesting Provider: n/a Chief Complaint: consult before colonoscopy, recall due 2011 but over the age of 53 History of Present Illness:   No gastrointestinal or general medical complaints. He recently apparently in Hemoccult cards done at [facility] family practice that were negative. Last colonoscopy was 5 years ago and was normal except for diverticulosis. His regular bowel movements without melena, hematochezia and denies anorexia or weight loss. He does take aspirin 81 mg a day, p.r.n. diclofenac, p.r.n. Aleve, and daily Prilosec 20 mg twice a day he denies dyspepsia reflux symptoms at this time.   GI Review of Systems      Denies abdominal pain, acid reflux, belching, bloating, chest pain, dysphagia with liquids, dysphagia with solids, heartburn, loss of appetite, nausea, vomiting, vomiting blood, weight loss, and  weight gain.        Denies anal fissure, black tarry stools, change in bowel habit, constipation, diarrhea, diverticulosis, fecal incontinence, heme positive stool, hemorrhoids, irritable bowel syndrome, jaundice, light color stool, liver problems, rectal bleeding, and  rectal pain.    Current Medications (verified): 1)  Proair Hfa 108 (90 Base) Mcg/act Aers (Albuterol Sulfate) .... Use As Directed 2)  Diovan 160 Mg Tabs (Valsartan) .... Take 1 Tablet By Mouth Once A Day 3)  Singulair 10 Mg Tabs (Montelukast Sodium) .... Take 1 Tablet By Mouth Once A Day 4)  Synthroid 75 Mcg  Tabs (Levothyroxine Sodium) .... Once Daily 5)  Prilosec 20 Mg Cpdr (Omeprazole) .Marland Kitchen.. 1 Two Times A Day 6)  Metoprolol Tartrate 25 Mg Tabs (Metoprolol Tartrate) .Marland Kitchen.. 1 Two Times A Day 7)  Crestor 10 Mg Tabs (Rosuvastatin Calcium) .Marland Kitchen.. 1 Once Daily 8)  Baby Aspirin 81 Mg Chew (Aspirin) .Marland Kitchen.. 1 Tablet By Mouth Once  Daily 9)  Diclofenac Sodium 75 Mg Tbec (Diclofenac Sodium) .Marland Kitchen.. 1 Once Daily As Needed 10)  Aleve 220 Mg Tabs (Naproxen Sodium) .... As Needed  Allergies (verified): 1)  ! Penicillin 2)  Amoxicillin (Amoxicillin)  Past History:  Family History: Last updated: 03/05/2009 Family History of CAD Male 1st degree relative <50 Family History of Alcoholism/Addiction Family History Other cancer mother Father died cancer Brother with MI in his 25s  Social History: Last updated: 09/25/2008 Retired Married Former Smoker stopped 4 years ago Alcohol use-no stopped 1 year ago Illicit Drug Use - no Patient gets regular exercise.  Past medical, surgical, family and social histories (including risk factors) reviewed for relevance to current acute and chronic problems.  Past Medical History: Reviewed history from 10/14/2009 and no changes required. Hypothyroidism chronic sinusitis Gout Osteoarthritis GERD 24mm mass in left lobe of liver Hypertension Asthma/COPD alcohol abuse 303.03 LBBB CAD-70% mid LAD, medical management  Past Surgical History: Reviewed history from 08/21/2008 and no changes required. pulmonary colapse with chect tube Tonsillectomy Carpal tunnel release  Family History: Reviewed history from 03/05/2009 and no changes required. Family History of CAD Male 1st degree relative <50 Family History of Alcoholism/Addiction Family History Other cancer mother Father died cancer Brother with MI in his 63s  Social History: Reviewed history from 09/25/2008 and no changes required. Retired Married Former Smoker stopped 4 years ago Alcohol use-no stopped 1 year ago Illicit Drug Use - no Patient gets regular exercise.  Review of Systems       The patient complains of arthritis/joint pain.  The patient  denies allergy/sinus, anemia, anxiety-new, back pain, blood in urine, breast changes/lumps, change in vision, confusion, cough, coughing up blood, depression-new,  fainting, fatigue, fever, headaches-new, hearing problems, heart murmur, heart rhythm changes, itching, muscle pains/cramps, night sweats, nosebleeds, shortness of breath, skin rash, sleeping problems, sore throat, swelling of feet/legs, swollen lymph glands, thirst - excessive, urination - excessive, urination changes/pain, urine leakage, vision changes, and voice change.    Vital Signs:  Patient profile:   77 year old male Height:      68 inches Weight:      142 pounds BSA:     1.77 Pulse rate:   74 / minute Pulse rhythm:   regular BP sitting:   132 / 78  (left arm)  Vitals Entered By: Merri Ray CMA Duncan Dull) (March 25, 2010 10:51 AM)  Physical Exam  General:  Well developed, well nourished, no acute distress. Head:  Normocephalic and atraumatic. Eyes:  PERRLA, no icterus.exam deferred to patient's ophthalmologist.   Abdomen:  Soft, nontender and nondistended. No masses, hepatosplenomegaly or hernias noted. Normal bowel sounds.   Impression & Recommendations:  Problem # 1:  GERD (ICD-530.81) Assessment Improved Continue reflex edema daily PPI therapy.  Problem # 2:  PERSONAL HX COLONIC POLYPS (ICD-V12.72) Assessment: Unchanged No need for followup currently at his age, no GI symptoms, negative exam 5 years ago, and recent negative Hemoccult cards.  Patient Instructions: 1)  Copy sent to : Dr. Darryll Capers. 2)  Please continue current medications.  3)  Please schedule a follow-up appointment as needed.

## 2011-01-01 NOTE — Letter (Signed)
Summary: Handout Printed  Printed Handout:  - *Patient Instructions 

## 2011-01-01 NOTE — Letter (Signed)
Summary: [facility]  [facility]   Imported By: Maryln Gottron 12/10/2010 15:51:05  _____________________________________________________________________  External Attachment:    Type:   Image     Comment:   External Document

## 2011-01-05 ENCOUNTER — Other Ambulatory Visit: Payer: Self-pay | Admitting: Internal Medicine

## 2011-01-07 ENCOUNTER — Encounter: Payer: Self-pay | Admitting: Internal Medicine

## 2011-01-07 ENCOUNTER — Ambulatory Visit (INDEPENDENT_AMBULATORY_CARE_PROVIDER_SITE_OTHER): Payer: Medicare Other | Admitting: Internal Medicine

## 2011-01-07 VITALS — BP 140/72 | HR 72 | Temp 98.1°F | Resp 14 | Ht 69.0 in | Wt 146.0 lb

## 2011-01-07 DIAGNOSIS — Z0000 Encounter for general adult medical examination without abnormal findings: Secondary | ICD-10-CM

## 2011-01-07 DIAGNOSIS — E785 Hyperlipidemia, unspecified: Secondary | ICD-10-CM

## 2011-01-07 DIAGNOSIS — I10 Essential (primary) hypertension: Secondary | ICD-10-CM

## 2011-01-07 DIAGNOSIS — D518 Other vitamin B12 deficiency anemias: Secondary | ICD-10-CM

## 2011-01-07 DIAGNOSIS — R972 Elevated prostate specific antigen [PSA]: Secondary | ICD-10-CM

## 2011-01-07 DIAGNOSIS — J449 Chronic obstructive pulmonary disease, unspecified: Secondary | ICD-10-CM

## 2011-01-07 DIAGNOSIS — R55 Syncope and collapse: Secondary | ICD-10-CM

## 2011-01-07 LAB — CBC WITH DIFFERENTIAL/PLATELET
Eosinophils Relative: 1.3 % (ref 0.0–5.0)
HCT: 36.5 % — ABNORMAL LOW (ref 39.0–52.0)
Monocytes Relative: 11.7 % (ref 3.0–12.0)
Neutrophils Relative %: 52.5 % (ref 43.0–77.0)
Platelets: 221 10*3/uL (ref 150.0–400.0)
WBC: 4.1 10*3/uL — ABNORMAL LOW (ref 4.5–10.5)

## 2011-01-07 LAB — BASIC METABOLIC PANEL
CO2: 31 mEq/L (ref 19–32)
Chloride: 104 mEq/L (ref 96–112)
Glucose, Bld: 94 mg/dL (ref 70–99)
Potassium: 5.4 mEq/L — ABNORMAL HIGH (ref 3.5–5.1)
Sodium: 142 mEq/L (ref 135–145)

## 2011-01-07 LAB — LIPID PANEL
HDL: 42.3 mg/dL (ref >39.00)
Total CHOL/HDL Ratio: 3
VLDL: 39.4 mg/dL (ref 0.0–40.0)

## 2011-01-07 LAB — POCT URINALYSIS DIPSTICK
Bilirubin, UA: NEGATIVE
Ketones, UA: NEGATIVE
Leukocytes, UA: NEGATIVE
Nitrite, UA: NEGATIVE
Protein, UA: NEGATIVE
pH, UA: 6

## 2011-01-07 LAB — HEPATIC FUNCTION PANEL
AST: 22 U/L (ref 0–37)
Albumin: 4.1 g/dL (ref 3.5–5.2)
Total Bilirubin: 0.6 mg/dL (ref 0.3–1.2)

## 2011-01-07 LAB — PSA: PSA: 1.23 ng/mL (ref 0.10–4.00)

## 2011-01-07 MED ORDER — CYANOCOBALAMIN 1000 MCG/ML IJ SOLN
1000.0000 ug | Freq: Once | INTRAMUSCULAR | Status: AC
  Administered 2011-01-07: 1000 ug via INTRAMUSCULAR

## 2011-01-07 NOTE — Progress Notes (Signed)
Subjective:    Patient ID: Albert Allen, male    DOB: [birth year], 77 y.o.   MRN: [id number]  HPI See the smart  Text  the patient presents for a wellness examination and review of her recent hospitalization at [facility] due to the syncopal episode.  During that hospitalization he was found to have an anemia which appears to be mixed iron and B12 iron level was 36 B12 level was 202 and the patient's hemoglobin is 9.9 with a hematocrit of 29.1 and an MCV of 102.   He denies any blood in his stools and has not had any gastric symptoms recently. He has been taking his Prilosec one tablet every day.   The medical problems reviewed today include joint disease for which he is taking Naprosyn which we may have to discontinue due to the risk of GI bleed hypertension for which he takes valsartan and hypothyroidism for which he has been on levothyroid 75 mcg a day.   he has mild to moderate COPD and has been on albuterol when necessary but he states that his breathing has been excellent.  He denies any chest pain shortness of breath PND orthopnea or diaphoresis   Subjective:    Albert Allen is a 77 y.o. male who presents for initial Medicare exam.   Cardiac risk factors: advanced age (older than 38 for men, 69 for women), dyslipidemia, hypertension, male gender, sedentary lifestyle and smoking/ tobacco exposure.  Depression Screen (Note: if answer to either of the following is "Yes", a more complete depression screening is indicated)  Q1: Over the past two weeks, have you felt down, depressed or hopeless? no Q2: Over the past two weeks, have you felt little interest or pleasure in doing things? no  Activities of Daily Living In your present state of health, do you have any difficulty performing the following activities?:  Preparing food and eating?: No Bathing yourself: No Getting dressed: No Using the toilet:No Moving around from place to place: No In the past  year have you fallen or had a near fall?:No  Current exercise habits: The patient does not participate in regular exercise at present.  Dietary issues discussed:   give the patient a copy of a low-cholesterol diet at the time of  Hearing difficulties: Yes patient  Has been evaluated  hearing losss but refuses hearing aids. He cannot hear a whispered voice at six-feet. Safe in current home environment: yes  the patient has a living will we have discussed limited no CODE BLUE. The patient has no cognitive deficits is able to read a watch face recall 3 objects after 5 minutes performs simple calculations including his check book and is oriented to person place and time with appropriate judgment. The following portions of the patient's history were reviewed and updated as appropriate: allergies, current medications, past family history, past medical history, past social history, past surgical history and problem list. Review of Systems Constitutional: negative Eyes: negative Ears, nose, mouth, throat, and face: negative Respiratory: positive for emphysema Cardiovascular: negative Gastrointestinal: negative Genitourinary:positive for frequency and hesitancy Musculoskeletal:positive for back pain, myalgias and stiff joints Neurological: negative Behavioral/Psych: positive for  history of tobacco abuse and history of alcohol abuse    Objective:     Vision by Snellen chart: right eye:20/20, left eye:20/20 Blood pressure 140/72, pulse 72, temperature 98.1 F (36.7 C), temperature source Oral, resp. rate 14, height 5\' 9"  (1.753 m), weight 146 lb (66.225 kg). Body mass index is 21.56  kg/(m^2). BP 140/72  Pulse 72  Temp(Src) 98.1 F (36.7 C) (Oral)  Resp 14  Ht 5\' 9"  (1.753 m)  Wt 146 lb (66.225 kg)  BMI 21.56 kg/m2  General Appearance:    Alert, cooperative, no distress, appears stated age  skin is pale  Head:    Normocephalic, without obvious abnormality, atraumatic  male pattern  balding  Eyes:    PERRL, conjunctiva/corneas clear, EOM's intact, fundi    benign, both eyes       Ears:    Normal TM's and external ear canals, both ears  Nose:   Nares normal, septum midline, mucosa normal, no drainage    or sinus tenderness  Throat:   Lips, mucosa, and tongue normal; teeth and gums normal  Neck:   Supple, symmetrical, trachea midline, no adenopathy;       thyroid:  No enlargement/tenderness/nodules; no carotid   bruit or JVD  Back:     Symmetric, no curvature, ROM normal, no CVA tenderness  Lungs:     Clear to auscultation bilaterally, respirations unlabored  Chest wall:    No tenderness or deformity  Heart:    Regular rate and rhythm, S1 and S2 normal, no murmur, rub   or gallop  Abdomen:     Soft, non-tender, bowel sounds active all four quadrants,    no masses, no organomegaly  Genitalia:    Normal male without lesion, discharge or tenderness  Rectal:    Normal tone prostate exam deferred to urologist  Extremities:   Extremities normal, atraumatic, no cyanosis,  Trace edema edema  Pulses:   2+ and symmetric all extremities  Skin:   Skin color, texture, turgor normal, no rashes or lesions  Lymph nodes:   Cervical, supraclavicular, and axillary nodes normal  Neurologic:   CNII-XII intact. Normal strength, sensation and reflexes      throughout      Assessment:     Medicare wellness examination    evaluation of syncope     lipidemia    hypothyroidism   COPD     Plan:    1.  The patient had an initial Medicare wellness examination and all services and referrals were performed as appropriate per the protocol. During the course of the visit the patient was educated and counseled about appropriate screening and preventive services including:   Prostate cancer screening 2.  The patient is syncopal the episode evaluated in  At [facility] with appropriate laboratory values observation and a carotid Doppler obtained.  His carotid Doppler showed no  obstructive disease or progression of disease chest x-ray was clear except for emphysema and the blood work showed both B12 and iron deficiency we have given him a B12 shot today he is on iron replacement we will monitor his reticulocyte count to make sure that he is showing response to the therapy if he is not showing response to therapy he should be referred to hematology oncology. 3. Hypothyroidism monitor TSH  4. Hyperlipidemia monitor  5. Normal PSA monitor PSA  6. History of gout monitor uric acid  7. Hypertension monitor basic metabolic panel Patient Instructions (the written plan) was given to the patient. ext portion         Review of Systems       Physical Exam

## 2011-02-09 ENCOUNTER — Other Ambulatory Visit: Payer: Self-pay | Admitting: Internal Medicine

## 2011-02-10 ENCOUNTER — Other Ambulatory Visit: Payer: Self-pay | Admitting: Family Medicine

## 2011-02-10 DIAGNOSIS — R918 Other nonspecific abnormal finding of lung field: Secondary | ICD-10-CM

## 2011-02-13 ENCOUNTER — Other Ambulatory Visit: Payer: Medicare Other

## 2011-02-16 ENCOUNTER — Ambulatory Visit
Admission: RE | Admit: 2011-02-16 | Discharge: 2011-02-16 | Disposition: A | Discharge status: Home / Self Care | Payer: Medicare Other | Source: Ambulatory Visit | Attending: Family Medicine | Admitting: Family Medicine

## 2011-02-16 DIAGNOSIS — R918 Other nonspecific abnormal finding of lung field: Secondary | ICD-10-CM

## 2011-02-18 ENCOUNTER — Other Ambulatory Visit: Payer: Self-pay | Admitting: Cardiovascular Disease

## 2011-02-18 DIAGNOSIS — I10 Essential (primary) hypertension: Secondary | ICD-10-CM

## 2011-02-18 MED ORDER — METOPROLOL TARTRATE 25 MG PO TABS: 25.0000 mg | ORAL_TABLET | Freq: Every day | ORAL | Status: DC

## 2011-02-18 NOTE — Telephone Encounter (Addendum)
Patient had an episode of possible syncope and he was taking Metoprolol Tartrate 25mg  BID. They decreased his Metoprolol to 25mg  daily.  He is feeling much better now and will f/u with Dr. Clifton James for his 1 year f/u on 03/19/11 @ 8:45am.

## 2011-03-11 LAB — CBC
Hemoglobin: 13.4 g/dL (ref 13.0–17.0)
MCHC: 34 g/dL (ref 30.0–36.0)
RBC: 4 MIL/uL — ABNORMAL LOW (ref 4.22–5.81)
RDW: 12.9 % (ref 11.5–15.5)

## 2011-03-11 LAB — APTT: aPTT: 29 seconds (ref 24–37)

## 2011-03-11 LAB — PROTIME-INR: INR: 1 (ref 0.00–1.49)

## 2011-03-16 ENCOUNTER — Ambulatory Visit (INDEPENDENT_AMBULATORY_CARE_PROVIDER_SITE_OTHER): Payer: Medicare Other | Admitting: Internal Medicine

## 2011-03-16 ENCOUNTER — Encounter: Payer: Self-pay | Admitting: Internal Medicine

## 2011-03-16 VITALS — BP 130/80 | HR 76 | Temp 98.3°F | Resp 16 | Ht 69.0 in | Wt 144.0 lb

## 2011-03-16 DIAGNOSIS — D518 Other vitamin B12 deficiency anemias: Secondary | ICD-10-CM

## 2011-03-16 DIAGNOSIS — J45909 Unspecified asthma, uncomplicated: Secondary | ICD-10-CM

## 2011-03-16 DIAGNOSIS — N411 Chronic prostatitis: Secondary | ICD-10-CM

## 2011-03-16 DIAGNOSIS — M1710 Unilateral primary osteoarthritis, unspecified knee: Secondary | ICD-10-CM

## 2011-03-16 DIAGNOSIS — E785 Hyperlipidemia, unspecified: Secondary | ICD-10-CM

## 2011-03-16 DIAGNOSIS — E039 Hypothyroidism, unspecified: Secondary | ICD-10-CM

## 2011-03-16 DIAGNOSIS — I10 Essential (primary) hypertension: Secondary | ICD-10-CM

## 2011-03-16 DIAGNOSIS — R972 Elevated prostate specific antigen [PSA]: Secondary | ICD-10-CM

## 2011-03-16 LAB — CBC WITH DIFFERENTIAL/PLATELET
Basophils Absolute: 0 10*3/uL (ref 0.0–0.1)
Eosinophils Absolute: 0.1 10*3/uL (ref 0.0–0.7)
Lymphocytes Relative: 41.1 % (ref 12.0–46.0)
MCHC: 34.1 g/dL (ref 30.0–36.0)
MCV: 100.1 fl — ABNORMAL HIGH (ref 78.0–100.0)
Monocytes Absolute: 0.7 10*3/uL (ref 0.1–1.0)
Neutro Abs: 1.7 10*3/uL (ref 1.4–7.7)
Neutrophils Relative %: 39.2 % — ABNORMAL LOW (ref 43.0–77.0)
RDW: 13.4 % (ref 11.5–14.6)

## 2011-03-16 MED ORDER — METHYLPREDNISOLONE ACETATE 40 MG/ML IJ SUSP: 40.0000 mg | Freq: Once | INTRAMUSCULAR | Status: DC

## 2011-03-16 NOTE — Assessment & Plan Note (Addendum)
Seeing the urologist for this in the past... we will take over the assessment of this issue he'll have a prostate exam at his next office visit

## 2011-03-16 NOTE — Assessment & Plan Note (Signed)
Monitoring of PSA  At current age no indicated. He no longer sees the urologist

## 2011-03-16 NOTE — Assessment & Plan Note (Signed)
Patient has some increased fatigue weight is stable he is due to monitoring of his thyroid

## 2011-03-16 NOTE — Assessment & Plan Note (Signed)
Patient has severe degenerative joint disease in his knees he has responded to steroid injections in the past has been 6 months since his last steroid injection therefore he is eligible and desires injections today.  Informed consent obtained and the patient's knee was prepped with Betadine local anesthesia obtained with topical spray 40 mg of Depo-Medrol and 1/2 cc of lidocaine was injected into both  joint space the patient tolerated the injection well post injection care discussed with patient ice placed

## 2011-03-16 NOTE — Assessment & Plan Note (Signed)
Had a hemoglobin of 9 at the hospital he has recovered to 12. He is due monitoring today He is followed by by Dr. Sheryn Bison

## 2011-03-16 NOTE — Assessment & Plan Note (Signed)
He has been stable on Crestor of note that the last lipid level was nonfasting

## 2011-03-16 NOTE — Assessment & Plan Note (Signed)
He is stable with his COPD as long as he continues to take the Singulair

## 2011-03-16 NOTE — Assessment & Plan Note (Signed)
Stable blood pressure  Has appointment with cardiology this month

## 2011-03-16 NOTE — Progress Notes (Signed)
Subjective:    Patient ID: Albert Allen, male    DOB: 18-Dec-1922, 77 y.o.   MRN: [id number]  HPI patient is a complex 77 year old white male with a history of mixed asthma COPD (long smoking history) CAD hypertension hyperlipidemia and severe degenerative joint disease.  His primary complaint today without problems around his knee pain 6 months ago he had injections in his knees which he stated a lot of pain-free and exercise for approximately last 6 months states that over the last few weeks it has been worsening dramatically.  He denies any chest pain shortness of breath PND orthopnea he has appointment with cardiologist this Wednesday. Previously he has seen a urologist urologist has moved to [facility] he wants [place] to monitor his PSA and his prostate examination he has both BPH and history of an elevated PSA at age 70 I am hesitant to continue monitoring PSAs however we will do a digital rectal examination at the next visit and treat  BPH as appropriate.    Review of Systems  Constitutional: Negative for fever and fatigue.  HENT: Negative for hearing loss, congestion, neck pain and postnasal drip.   Eyes: Negative for discharge, redness and visual disturbance.  Respiratory: Negative for cough, shortness of breath and wheezing.   Cardiovascular: Negative for leg swelling.  Gastrointestinal: Negative for abdominal pain, constipation and abdominal distention.  Genitourinary: Negative for urgency and frequency.  Musculoskeletal: Negative for joint swelling and arthralgias.  Skin: Negative for color change and rash.  Neurological: Negative for weakness and light-headedness.  Hematological: Negative for adenopathy.  Psychiatric/Behavioral: Negative for behavioral problems.   Past Medical History  Diagnosis Date  . HYPOTHYROIDISM 05/31/2007  . HYPERLIPIDEMIA 03/27/2009  . GOUT 05/31/2007  . ANEMIA, B12 DEFICIENCY 03/29/2008  . Anxiety state, unspecified 11/19/2008  . ABUSE, ALCOHOL, IN  REMISSION 05/31/2007  . CARPAL TUNNEL SYNDROME, RIGHT 09/05/2007  . 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 HYPERTENSION 05/31/2007  . CAD, NATIVE VESSEL 04/04/2009  . LEFT BUNDLE BRANCH BLOCK 02/11/2009  . SINUSITIS, CHRONIC NOS 05/31/2007  . ASTHMA 05/31/2007  . GERD 05/31/2007  . Chronic prostatitis 03/29/2008  . LOC OSTEOARTHROS NOT SPEC PRIM/SEC LOWER LEG 03/20/2010  . OSTEOARTHRITIS 05/31/2007  . HIP PAIN 03/20/2010  . DEGENERATIVE DISC DISEASE, CERVICAL SPINE 06/22/2007  . Adhesive capsulitis of shoulder 12/29/2007  . SYNCOPE 11/19/2008  . WEIGHT LOSS, RECENT 11/19/2008  . DYSPHAGIA UNSPECIFIED 09/25/2008  . Hepatomegaly 09/25/2008  . PSA, INCREASED 03/20/2010  . ABNORMAL CV (STRESS) TEST 03/05/2009  . PERSONAL HX COLONIC POLYPS 03/25/2010  . Liver mass, left lobe    Past Surgical History  Procedure Date  . Pul. colapse w/chest tube   . Tonsillectomy   . Carpel tunnel release     reports that he quit smoking about 5 years ago. His smoking use included Cigarettes. He does not have any smokeless tobacco history on file. He reports that he does not drink alcohol or use illicit drugs. family history includes COPD in his father and Cancer in his mother. Allergies  Allergen Reactions  . Amoxicillin     REACTION: unspecified  . Penicillins        Objective:   Physical Exam  Constitutional: He is oriented to person, place, and time. He appears well-developed and well-nourished.  HENT:  Head: Normocephalic and atraumatic.  Eyes: Conjunctivae are normal. Pupils are equal, round, and reactive to light.  Neck: Normal range of motion. Neck supple.  Cardiovascular: Normal rate and regular rhythm.   Pulmonary/Chest: Effort  normal and breath sounds normal.  Abdominal: Soft. Bowel sounds are normal.  Musculoskeletal: Normal range of motion.  Neurological: He is alert and oriented to person, place, and time.          Assessment & Plan:  .

## 2011-03-19 ENCOUNTER — Ambulatory Visit (INDEPENDENT_AMBULATORY_CARE_PROVIDER_SITE_OTHER): Payer: Medicare Other | Admitting: Cardiovascular Disease

## 2011-03-19 ENCOUNTER — Encounter: Payer: Self-pay | Admitting: Cardiovascular Disease

## 2011-03-19 VITALS — BP 174/80 | HR 55 | Ht 68.0 in | Wt 144.0 lb

## 2011-03-19 DIAGNOSIS — I2510 Atherosclerotic heart disease of native coronary artery without angina pectoris: Secondary | ICD-10-CM

## 2011-03-19 DIAGNOSIS — I10 Essential (primary) hypertension: Secondary | ICD-10-CM

## 2011-03-19 MED ORDER — VALSARTAN 320 MG PO TABS: 320.0000 mg | ORAL_TABLET | Freq: Every day | ORAL | Status: DC

## 2011-03-19 NOTE — Progress Notes (Signed)
History of Present Illness:77 yo WM with past medical history significant for CAD, HTN, anemia, asthma/COPD, GERD, OA, gout hypothyroidism, hiatal hernia and alcohol abuse seen as a new patient 03/05/09 for further evaluation of atypical chest pain, new LBBB and abnormal Myoview. Cardiac cath was performed on 03/13/09 and we found a 70-80% heavily calcified mid LAD that was severely diseased throughout the mid section with heavy calcification. There was diffuse moderately obstructive disease in the other vessels. I discussed his disease with him in the hospital and we elected for medical management given that he was asymptomatic. No stents were placed.  He returns today for follow up. He has been doing well. He has been working around the yard in the garden and in the house without any chest pain.  His breathing has been at baseline.  He tells me that he feels great.  He passed out in church on December 21, 2010. He was taken to the hospital and CT scan showed pneumonia and he was found to have anemia. He has felt better since then. He had recent cataract surgery on the right eye. His blood pressure at home has been in the 150s.   Past Medical History  Diagnosis Date  . HYPOTHYROIDISM 05/31/2007  . HYPERLIPIDEMIA 03/27/2009  . GOUT 05/31/2007  . ANEMIA, B12 DEFICIENCY 03/29/2008  . Anxiety state, unspecified 11/19/2008  . ABUSE, ALCOHOL, IN REMISSION 05/31/2007  . CARPAL TUNNEL SYNDROME, RIGHT 09/05/2007  . 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 HYPERTENSION 05/31/2007  . CAD, NATIVE VESSEL 04/04/2009  . LEFT BUNDLE BRANCH BLOCK 02/11/2009  . SINUSITIS, CHRONIC NOS 05/31/2007  . ASTHMA 05/31/2007  . GERD 05/31/2007  . Chronic prostatitis 03/29/2008  . LOC OSTEOARTHROS NOT SPEC PRIM/SEC LOWER LEG 03/20/2010  . OSTEOARTHRITIS 05/31/2007  . HIP PAIN 03/20/2010  . DEGENERATIVE DISC DISEASE, CERVICAL SPINE 06/22/2007  . Adhesive capsulitis of shoulder 12/29/2007  . SYNCOPE 11/19/2008  . WEIGHT LOSS, RECENT 11/19/2008  .  DYSPHAGIA UNSPECIFIED 09/25/2008  . Hepatomegaly 09/25/2008  . PSA, INCREASED 03/20/2010  . ABNORMAL CV (STRESS) TEST 03/05/2009  . PERSONAL HX COLONIC POLYPS 03/25/2010  . Liver mass, left lobe     Past Surgical History  Procedure Date  . Pul. colapse w/chest tube   . Tonsillectomy   . Carpel tunnel release     Current Outpatient Prescriptions  Medication Sig Dispense Refill  . albuterol (PROAIR HFA) 108 (90 BASE) MCG/ACT inhaler Inhale 2 puffs into the lungs every 6 (six) hours as needed.        . ALPRAZolam (XANAX) 0.25 MG tablet Take 0.25 mg by mouth every 6 (six) hours as needed.        Marland Kitchen aspirin 81 MG tablet Take 81 mg by mouth daily.        . diclofenac (VOLTAREN) 75 MG EC tablet Take 75 mg by mouth daily as needed.        . folic acid (FOLVITE) 1 MG tablet Take 1 mg by mouth daily.        Marland Kitchen levothyroxine (SYNTHROID, LEVOTHROID) 75 MCG tablet TAKE ONE TABLET BY MOUTH ONE TIME DAILY  30 tablet  4  . metoprolol tartrate (LOPRESSOR) 25 MG tablet Take 1 tablet (25 mg total) by mouth daily.  30 tablet  6  . montelukast (SINGULAIR) 10 MG tablet Take 10 mg by mouth at bedtime.        Marland Kitchen omeprazole (PRILOSEC) 20 MG capsule Take 20 mg by mouth 2 (two) times daily.        Marland Kitchen  POLYSACCHARIDE IRON (IRON POLYSACCHARIDES) 150 MG CAPS Take by mouth daily.        . rosuvastatin (CRESTOR) 10 MG tablet Take 10 mg by mouth daily.        . valsartan (DIOVAN) 160 MG tablet Take 160 mg by mouth daily.         Current Facility-Administered Medications  Medication Dose Route Frequency Provider Last Rate Last Dose  . methylPREDNISolone acetate (DEPO-MEDROL) injection 40 mg  40 mg Intra-articular Once Carrie Mew      . methylPREDNISolone acetate (DEPO-MEDROL) injection 40 mg  40 mg Intra-articular Once Carrie Mew        Allergies  Allergen Reactions  . Amoxicillin     REACTION: unspecified  . Penicillins     History   Social History  . Marital Status: Married    Spouse Name:  N/A    Number of Children: N/A  . Years of Education: N/A   Occupational History  . retired    Social History Main Topics  . Smoking status: Former Smoker    Types: Cigarettes    Quit date: 12/27/2005  . Smokeless tobacco: Not on file  . Alcohol Use: No  . Drug Use: No  . Sexually Active: Not Currently   Other Topics Concern  . Not on file   Social History Narrative  . No narrative on file    Family History  Problem Relation Age of Onset  . Cancer Mother   . COPD Father     Review of Systems:  As stated in the HPI and otherwise negative.   BP 174/80  Pulse 55  Ht 5\' 8"  (1.727 m)  Wt 144 lb (65.318 kg)  BMI 21.90 kg/m2  Physical Examination: General: Well developed, well nourished, NAD HEENT: OP clear, mucus membranes moist SKIN: warm, dry. No rashes. Neuro: No focal deficits Musculoskeletal: Muscle strength 5/5 all ext Psychiatric: Mood and affect normal Neck: No JVD, no carotid bruits, no thyromegaly, no lymphadenopathy. Lungs:Clear bilaterally, no wheezes, rhonci, crackles Cardiovascular: Regular rate and rhythm. No murmurs, gallops or rubs. Abdomen:Soft. Bowel sounds present. Non-tender.  Extremities: No lower extremity edema. Pulses are 2 + in the bilateral DP/PT.  EKG: sinus brady, rate 55 bpm. 1st degree AV block.

## 2011-03-19 NOTE — Patient Instructions (Signed)
Your physician recommends that you schedule a follow-up appointment in: 6 months with Dr. Clifton James  Your physician has recommended you make the following change in your medication: INCREASE DIOVAN to 320 mg daily.

## 2011-03-19 NOTE — Assessment & Plan Note (Addendum)
Stable. Continue medical management with ASA, statin and beta blocker.

## 2011-03-19 NOTE — Assessment & Plan Note (Signed)
Uncontrolled. Will increase his Diovan to 320 mg po once daily. He will check his blood pressure every day and let us know results in 5-7 days.

## 2011-04-03 ENCOUNTER — Other Ambulatory Visit: Payer: Self-pay | Admitting: *Deleted

## 2011-04-03 MED ORDER — ALPRAZOLAM 0.25 MG PO TABS: 0.2500 mg | ORAL_TABLET | Freq: Four times a day (QID) | ORAL | Status: DC | PRN

## 2011-04-14 NOTE — Op Note (Signed)
NAME:  Albert Allen, Albert Allen             ACCOUNT NO.:  [id number]   MEDICAL RECORD NO.:  [id number]          PATIENT TYPE:  AMB   LOCATION:  DSC                          FACILITY:  [facility]   PHYSICIAN:  Katy Fitch. Sypher, M.D. DATE OF BIRTH:  05-Nov-1923   DATE OF PROCEDURE:  07/17/2008  DATE OF DISCHARGE:                               OPERATIVE REPORT   PREOPERATIVE DIAGNOSIS:  Chronic entrapment neuropathy right median  nerve at carpal tunnel with associated right thumb carpometacarpal  arthrosis.   POSTOPERATIVE DIAGNOSIS:  Chronic entrapment neuropathy right median  nerve at carpal tunnel with associated right thumb carpometacarpal  arthrosis.   OPERATION:  Release of right transverse carpal ligament.   OPERATING SURGEON:  Katy Fitch. Sypher, MD   ASSISTANT:  None.   ANESTHESIA:  General by LMA.   SUPERVISING ANESTHESIOLOGIST:  Germaine Pomfret, MD   INDICATIONS:  Albert Allen is an 77 year old gentleman referred for  evaluation and management of numbness and discomfort in the right hand.  He was noted to have significant right thumb CMC arthrosis and clinical  evidence of carpal tunnel syndrome.  He had mild thenar atrophy.   Electrodiagnostic studies confirmed significant right carpal tunnel  syndrome.   Due to a failure to respond to nonoperative measures, he is brought to  the operating room at this time for release of his right transverse  carpal ligament.   PROCEDURE:  Albert Allen is brought to the operating room and placed  in supine position upon the operating table.   Following the induction of general anesthesia by LMA technique, the  right arm was prepped with Betadine soap solution and sterilely draped.  A pneumatic tourniquet was applied to the proximal right brachium.   Following exsanguination of the right arm with an Esmarch bandage, the  arterial tourniquet was inflated to 250 mmHg.  The procedure commenced  with a short incision in the line of the  ring finger in the palm.  Subcutaneous tissues were carefully divided revealing the palmar fascia.  This was split longitudinally to reveal the common sensory branch of the  median nerve.  These were followed back to the transverse carpal  ligament which was gently isolated from the median nerve.  The ligament  was released along its ulnar border extending into the distal forearm.  This widely opened the carpal canal.  No masses or other predicaments  were noted.  Bleeding points were electrocauterized with bipolar current  followed by repair of the skin with intradermal 3-0 Prolene suture.   A compressive dressing was applied with a volar plaster splint  maintaining the wrist in 5 degrees of dorsiflexion.   There were no apparent complications.  [name] was awakened from  anesthesia, transferred to recovery room with stable vital signs.  He  will be discharged to the care of his wife with a prescription for  Vicodin 5 mg 1 p.o. q.4-6 h. p.r.n. pain, 20 tablets without refill.      Katy Fitch Sypher, M.D.  Electronically Signed     RVS/MEDQ  D:  07/17/2008  T:  07/18/2008  Job:  [id number] 

## 2011-04-14 NOTE — Cardiovascular Report (Signed)
NAME:  Albert Allen, Albert Allen NO.:  [id number]   MEDICAL RECORD NO.:  [id number]          PATIENT TYPE:  OIB   LOCATION:  2899                         FACILITY:  [facility]   PHYSICIAN:  Verne Carrow, MDDATE OF BIRTH:  1923-01-18   DATE OF PROCEDURE:  DATE OF DISCHARGE:  03/13/2009                            CARDIAC CATHETERIZATION   PRIMARY CARE PHYSICIAN:  Stacie Glaze, MD   PROCEDURE PERFORMED:  1. Left heart catheterization.  2. Selective coronary angiography.  3. Left ventricular angiogram.   OPERATOR:  Verne Carrow, MD   INDICATIONS:  This is a 77 year old Caucasian male with a history of  hypertension, former tobacco abuse and former alcohol abuse, who was  found during a primary care visit to have a new left bundle-branch block  on his EKG.  The patient has mostly been asymptomatic and works very  hard in his yard and around his house.  He had even been climbing up on  the roof to paint the tin on his shed last week.  He has had no  lifestyle-altering angina.  He was referred for stress test because of  his left bundle-branch block and this showed an area of anteroseptal and  mid anterior wall ischemia with normal left ventricular function.  He  was seen in my Cardiology office last week and we arranged the  diagnostic left heart catheterization for today.   PROCEDURE IN DETAIL:  The patient was brought into the Outpatient Area  of the Main Cath Lab and signed informed consent for the procedure.  The  right groin was prepped and draped in a sterile fashion.  Lidocaine 1%  was used for local anesthesia.  A 5-French sheath was inserted into the  right femoral artery without difficulty.  A JL-3.5 diagnostic catheter  was used to selectively engage the left main coronary artery.  A JR-4  diagnostic catheter was used to selectively engage and inject the right  coronary artery.  A 5-French pigtail catheter was used across the aortic  valve into the  left ventricle.  Following the performance of the left  ventricular angiogram, the catheter was pulled back across the aortic  valve with no significant pressure gradient measured.  The patient  tolerated the procedure well and was taken to the holding area in stable  condition.   HEMODYNAMIC FINDINGS:  Central aortic pressure 136/70.  Left ventricular  pressure 122/7.  Left ventricular end-diastolic pressure 7.   ANGIOGRAPHIC FINDINGS:  1. The left main coronary artery has no evidence of obstructive      disease.  2. The left anterior descending is a moderate-sized vessel that      courses to the apex and gives off a large third diagonal branch      that appears to be larger in caliber than the mid and distal LAD.      The proximal portion of the LAD has heavy calcification and a long      area of 50% stenosis.  Just prior to the takeoff of the large      diagonal branch, there is a 70-80% heavily calcified area  of      stenosis.  The remainder of the mid segment of the LAD is heavily      diseased with 50-60% stenosis and calcification.  The large      diagonal branch has a 50% ostial stenosis.  The distal LAD is small      in caliber and has plaque disease only.  3. The circumflex artery has plaque in the proximal portion and gives      off 2 small early marginal branches.  The third marginal branch is      a moderate-sized vessel and has an ostial 20% stenosis.  4. The right coronary artery is a dominant vessel that has serial 30%      lesions in the proximal portion.  The distal right coronary artery      bifurcates into a posterior descending artery and a branching      posterolateral segment.  5. Left ventricular angiogram was performed in the RAO projection and      shows normal left ventricular systolic function with no wall motion      abnormalities.  Ejection fraction is estimated at 60%.   IMPRESSION:  1. Single-vessel coronary artery disease with a heavily calcified       proximal and mid LAD and an area of stenosis that is just prior to      the take off of a large diagonal branch.  2. Normal left ventricular systolic function.   RECOMMENDATIONS:  This patient's lesion in the proximal mid LAD is not  favorable for percutaneous coronary intervention.  There is heavy  calcification and an area of stenosis that is just at the take off of a  large diagonal branch which is a very important vessel for this patient.  Any attempts at percutaneous intervention would potentially compromise  this large diagonal branch.  The mid and distal LAD also does not appear  to be large in caliber and would most likely require stenting with a  very small stent.  Since Mr. Hayduk is currently having no exertional  angina, no exertional shortness of breath, and no overall fatigue, I  think it would be best at this time to proceed with medical management  of his single-vessel coronary artery disease.  As stated above, his left  ventricular systolic function is normal.  His filling pressures in the  left ventricle are normal.  I will continue his daily aspirin and we  will start him on Lopressor 25 mg twice daily and will start him on a  statin medication as well.  I will see him as an outpatient in several  weeks to see how he is doing and we will plan in the future to perform a  treadmill exercise test to see if he has any angina or arrhythmias  during exercise.      Verne Carrow, MD  Electronically Signed     CM/MEDQ  D:  03/13/2009  T:  03/14/2009  Job:  [id number]   cc:   Stacie Glaze, MD

## 2011-04-17 ENCOUNTER — Encounter: Payer: Self-pay | Admitting: *Deleted

## 2011-04-17 NOTE — Op Note (Signed)
NAME:  Albert Allen, Albert Allen             ACCOUNT NO.:  [id number]   MEDICAL RECORD NO.:  [id number]          PATIENT TYPE:  AMB   LOCATION:  DSC                          FACILITY:  [facility]   PHYSICIAN:  Katy Fitch. Sypher, M.D. DATE OF BIRTH:  04/24/1923   DATE OF PROCEDURE:  08/19/2006  DATE OF DISCHARGE:                                 OPERATIVE REPORT   PREOPERATIVE DIAGNOSIS:  Chronic entrapment neuropathy, median nerve, left  carpal tunnel.   POSTOPERATIVE DIAGNOSIS:  Chronic entrapment neuropathy, median nerve, left  carpal tunnel.   OPERATIONS:  Release of left transverse carpal ligament.   OPERATIONS:  Josephine Igo, MD   ASSISTANT:  Annye Rusk, PA-C.   ANESTHESIA:  General by LMA.   SUPERVISING ANESTHESIOLOGIST:  Zenon Mayo, MD   INDICATIONS:  Albert Allen is an 77 year old gentleman referred for  evaluation and management of a numb and painful left hand.  Clinical  examination revealed signs of left carpal tunnel syndrome.  Electrodiagnostic confirmed significant left median neuropathy at the wrist  level.   Due to failure to respond to nonoperative measures, Albert Allen is brought  to the operating room at this time, anticipating release of his left  transverse carpal ligament.   PROCEDURE:  Albert Allen was brought to the operating room and placed in  supine position upon the table.   Following the induction of general anesthesia by LMA technique.  The left  arm was prepped with Betadine soap and solution and sterilely draped.   Following exsanguination of the left arm with an Esmarch bandage, the  arterial tourniquet was inflated to 220 mmHg.  The procedure commenced with  a short incision in the line of the ring finger and the palm.  Subcutaneous  tissue were carefully divided, revealing the palmar fascia.  This was split  longitudinally to reveal the common extensor branch of the median nerve.   These were followed back to the transcarpal  ligament, which was carefully  isolated at the median nerve.   The ligament was released along its ulnar border, extending into the distal  forearm.   This widely opened the carpal canal.  No mass or other predicaments were  noted.   Bleeding points along the margin of the release ligament were  electrocauterized with bipolar current followed by repair of the skin with  intradermal 3-0 Prolene suture.   A compressive dressing was applied, with a volar plaster splint to maintain  the wrist in 5 degrees of dorsiflexion.   For aftercare, Albert Allen is provided a prescription for Vicodin 5 mg one  p.o. q 4-6 h. p.r.n. pain.  He is advised to elevate his hand for the next 3  days.  He will work on range of motion exercises and use his hand as  tolerated.  He will return to see [place] for followup in the office in 1 week.      Katy Fitch Sypher, M.D.  Electronically Signed     RVS/MEDQ  D:  08/19/2006  T:  08/21/2006  Job:  [id number]

## 2011-04-21 ENCOUNTER — Ambulatory Visit (INDEPENDENT_AMBULATORY_CARE_PROVIDER_SITE_OTHER): Payer: Medicare Other | Admitting: Gastroenterology

## 2011-04-21 ENCOUNTER — Encounter: Payer: Self-pay | Admitting: Gastroenterology

## 2011-04-21 DIAGNOSIS — K219 Gastro-esophageal reflux disease without esophagitis: Secondary | ICD-10-CM

## 2011-04-21 DIAGNOSIS — K625 Hemorrhage of anus and rectum: Secondary | ICD-10-CM

## 2011-04-21 DIAGNOSIS — Z86010 Personal history of colonic polyps: Secondary | ICD-10-CM

## 2011-04-21 DIAGNOSIS — R110 Nausea: Secondary | ICD-10-CM

## 2011-04-21 MED ORDER — PEG-KCL-NACL-NASULF-NA ASC-C 100 G PO SOLR: 1.0000 | Freq: Once | ORAL | Status: AC

## 2011-04-21 MED ORDER — DEXLANSOPRAZOLE 60 MG PO CPDR: 60.0000 mg | DELAYED_RELEASE_CAPSULE | Freq: Every day | ORAL | Status: DC

## 2011-04-21 NOTE — Progress Notes (Signed)
History of Present Illness:  This is a 77 year old Caucasian male with continued hoarseness and acid reflux symptoms despite taking Prilosec 20 mg a day. He apparently was hospitalized in January with possible aspiration pneumonia. He has a long history of previous alcohol abuse but no known history of pancreatitis or hepatitis. He currently drinks allegedly very seldomly. At the time of his admission he had rather marked iron deficiency anemia of unexplained etiology. He denies change in bowel habits, melena or hematochezia. He has a long history of recurrent multiple colon polyps with last examined 2006. Also has hypertensive cardiovascular disease in addition to COPD. Family history is noncontributory. He denies abdominal pain, or systemic complaints.  I have reviewed this patient's present history, medical and surgical past history, allergies and medications.     ROS: The remainder of the 10 point ROS is negative,,, he does have shortness of breath with exertion but denies other cardiovascular or pulmonary complaints. His weight is stable, and he denies a specific food intolerances.    Physical Exam: General well developed well nourished patient in no acute distress, appearing his stated age Eyes PERRLA, no icterus, fundoscopic exam per opthamologist Skin no lesions noted Neck supple, no adenopathy, no thyroid enlargement, no tenderness Chest clear to percussion and auscultation Heart no significant murmurs, gallops or rubs noted Abdomen no hepatosplenomegaly masses or tenderness, BS normal.  Extremities no acute joint lesions, edema, phlebitis or evidence of cellulitis. Neurologic patient oriented x 3, cranial nerves intact, no focal neurologic deficits noted. Psychological mental status normal and normal affect.  Assessment and plan: Chronic GERD by daily Prilosec therapy. He does have a past history of peptic strictures of his esophagus requiring dilation. I have scheduled him for  endoscopy, and it changed him to Dexilant 60 mg a day one hour before breakfast. Standard reflux maneuvers also reviewed. The cause of his iron deficiency also have scheduled him for followup colonoscopy exam. The patient is on aspirin 81 mg a day. He is to continue his other medications as listed and reviewed in his chart. Review of labs shows no evidence of liver function test abnormalities, and I cannot appreciate stigmata of chronic liver disease or hepatosplenomegaly. As above, the patient denies current alcohol abuse. Encounter Diagnoses  Name Primary?  . Nausea   . Esophageal reflux   . Personal history of colonic polyps   . Rectal bleeding

## 2011-04-21 NOTE — Patient Instructions (Signed)
Your procedure has been scheduled for 06/15/2011, please follow the seperate instructions.  Your prescription(s) have been sent to you pharmacy.  Stop Prilosec and start samples of Dexilant one a day.

## 2011-06-15 ENCOUNTER — Encounter: Payer: Self-pay | Admitting: Gastroenterology

## 2011-06-15 ENCOUNTER — Ambulatory Visit (AMBULATORY_SURGERY_CENTER): Payer: Medicare Other | Admitting: Gastroenterology

## 2011-06-15 ENCOUNTER — Other Ambulatory Visit: Payer: Self-pay | Admitting: Gastroenterology

## 2011-06-15 DIAGNOSIS — K219 Gastro-esophageal reflux disease without esophagitis: Secondary | ICD-10-CM

## 2011-06-15 DIAGNOSIS — Z860100 Personal history of colon polyps, unspecified: Secondary | ICD-10-CM

## 2011-06-15 DIAGNOSIS — K5730 Diverticulosis of large intestine without perforation or abscess without bleeding: Secondary | ICD-10-CM

## 2011-06-15 DIAGNOSIS — R1310 Dysphagia, unspecified: Secondary | ICD-10-CM

## 2011-06-15 DIAGNOSIS — K449 Diaphragmatic hernia without obstruction or gangrene: Secondary | ICD-10-CM

## 2011-06-15 DIAGNOSIS — Z1211 Encounter for screening for malignant neoplasm of colon: Secondary | ICD-10-CM

## 2011-06-15 DIAGNOSIS — K5710 Diverticulosis of small intestine without perforation or abscess without bleeding: Secondary | ICD-10-CM | POA: Insufficient documentation

## 2011-06-15 DIAGNOSIS — K644 Residual hemorrhoidal skin tags: Secondary | ICD-10-CM

## 2011-06-15 DIAGNOSIS — K625 Hemorrhage of anus and rectum: Secondary | ICD-10-CM

## 2011-06-15 MED ORDER — ESOMEPRAZOLE MAGNESIUM 40 MG PO CPDR: 40.0000 mg | DELAYED_RELEASE_CAPSULE | Freq: Every day | ORAL | Status: DC

## 2011-06-15 MED ORDER — SODIUM CHLORIDE 0.9 % IV SOLN: 500.0000 mL | INTRAVENOUS | Status: DC

## 2011-06-15 NOTE — Patient Instructions (Signed)
Please follow the Esophageal Dilation Diet.  Nothing by mouth until 10:15 am  Clear Liquids for 1 hour 10:15-11:15 am  Soft Foods for the rest of today 11:15 am until tomorrow  Please eat a diet that is high in fiber.  You may resume your medications as you would normally take them.

## 2011-06-15 NOTE — Progress Notes (Signed)
Pt tolerated the colon and egd with dil very well.  No complaints noted. MAW

## 2011-06-16 ENCOUNTER — Telehealth: Payer: Self-pay | Admitting: *Deleted

## 2011-06-16 DIAGNOSIS — K219 Gastro-esophageal reflux disease without esophagitis: Secondary | ICD-10-CM

## 2011-06-16 LAB — HELICOBACTER PYLORI SCREEN-BIOPSY: UREASE: NEGATIVE

## 2011-06-16 NOTE — Telephone Encounter (Signed)

## 2011-06-22 ENCOUNTER — Ambulatory Visit (INDEPENDENT_AMBULATORY_CARE_PROVIDER_SITE_OTHER): Payer: Medicare Other | Admitting: Internal Medicine

## 2011-06-22 ENCOUNTER — Encounter: Payer: Self-pay | Admitting: Internal Medicine

## 2011-06-22 VITALS — BP 140/80 | HR 72 | Temp 98.2°F | Resp 16 | Ht 68.0 in | Wt 148.0 lb

## 2011-06-22 DIAGNOSIS — I10 Essential (primary) hypertension: Secondary | ICD-10-CM

## 2011-06-22 DIAGNOSIS — R339 Retention of urine, unspecified: Secondary | ICD-10-CM

## 2011-06-22 DIAGNOSIS — E039 Hypothyroidism, unspecified: Secondary | ICD-10-CM

## 2011-06-22 DIAGNOSIS — K219 Gastro-esophageal reflux disease without esophagitis: Secondary | ICD-10-CM

## 2011-06-22 DIAGNOSIS — M1710 Unilateral primary osteoarthritis, unspecified knee: Secondary | ICD-10-CM

## 2011-06-22 DIAGNOSIS — K222 Esophageal obstruction: Secondary | ICD-10-CM

## 2011-06-22 DIAGNOSIS — N401 Enlarged prostate with lower urinary tract symptoms: Secondary | ICD-10-CM

## 2011-06-22 MED ORDER — MONTELUKAST SODIUM 10 MG PO TABS: 10.0000 mg | ORAL_TABLET | Freq: Every day | ORAL | Status: DC

## 2011-06-22 MED ORDER — SAW PALMETTO (SERENOA REPENS) 80 MG PO CAPS: 80.0000 mg | ORAL_CAPSULE | Freq: Two times a day (BID) | ORAL | Status: DC

## 2011-06-22 MED ORDER — METHYLPREDNISOLONE ACETATE 40 MG/ML IJ SUSP: 40.0000 mg | Freq: Once | INTRAMUSCULAR | Status: DC

## 2011-06-22 NOTE — Assessment & Plan Note (Signed)
Informed consent obtained and both knees were prepped with Betadine local anesthesia obtained with topical spray 40 mg of Depo-Medrol and 1/2 cc of lidocaine was injected into both  joint space the patient tolerated the injection well post injection care discussed with patient ice placed Right and left knees

## 2011-06-22 NOTE — Progress Notes (Signed)
Subjective:    Patient ID: Albert Allen, male    DOB: November 02, 1923, 77 y.o.   MRN: [id number]  HPI Patient is an 77 year old white male presents for followup of hypertension hyperlipidemia hypothyroidism and severe osteoarthritis of his knees bilaterally.  Since he has ceased alcohol and smoking he has done exceptionally well breathing has been steady blood pressure is well controlled medication management has been compliant.  He denies any chest pain or abnormal shortness of breath swelling of his extremities he states these activities maintain the garden all summer.  His primary complaint today in addition to follow up for his prior medical problems is that he has bilateral knee pain as moderate to severe and is affecting his ability to accomplish as gardening.  He does have a history of severe degenerative joint disease of the knees bilaterally   Review of Systems  Constitutional: Negative for fever and fatigue.  HENT: Negative for hearing loss, congestion, neck pain and postnasal drip.   Eyes: Negative for discharge, redness and visual disturbance.  Respiratory: Negative for cough, shortness of breath and wheezing.   Cardiovascular: Negative for leg swelling.  Gastrointestinal: Negative for abdominal pain, constipation and abdominal distention.  Genitourinary: Negative for urgency and frequency.  Musculoskeletal: Negative for joint swelling and arthralgias.  Skin: Negative for color change and rash.  Neurological: Negative for weakness and light-headedness.  Hematological: Negative for adenopathy.  Psychiatric/Behavioral: Negative for behavioral problems.   Past Medical History  Diagnosis Date  . HYPOTHYROIDISM 05/31/2007  . HYPERLIPIDEMIA 03/27/2009  . GOUT 05/31/2007  . ANEMIA, B12 DEFICIENCY 03/29/2008  . Anxiety state, unspecified 11/19/2008  . ABUSE, ALCOHOL, IN REMISSION 05/31/2007  . CARPAL TUNNEL SYNDROME, RIGHT 09/05/2007  . 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  HYPERTENSION 05/31/2007  . CAD, NATIVE VESSEL 04/04/2009  . LEFT BUNDLE BRANCH BLOCK 02/11/2009  . SINUSITIS, CHRONIC NOS 05/31/2007  . ASTHMA 05/31/2007  . GERD 05/31/2007  . Chronic prostatitis 03/29/2008  . LOC OSTEOARTHROS NOT SPEC PRIM/SEC LOWER LEG 03/20/2010  . OSTEOARTHRITIS 05/31/2007  . HIP PAIN 03/20/2010  . DEGENERATIVE DISC DISEASE, CERVICAL SPINE 06/22/2007  . Adhesive capsulitis of shoulder 12/29/2007  . SYNCOPE 11/19/2008  . WEIGHT LOSS, RECENT 11/19/2008  . DYSPHAGIA UNSPECIFIED 09/25/2008  . Hepatomegaly 09/25/2008  . PSA, INCREASED 03/20/2010  . ABNORMAL CV (STRESS) TEST 03/05/2009  . PERSONAL HX COLONIC POLYPS 03/05/1997    adenomatous   . Liver mass, left lobe   . Hiatal hernia   . Esophageal stricture    Past Surgical History  Procedure Date  . Pul. colapse w/chest tube   . Tonsillectomy   . Carpel tunnel release     reports that he quit smoking about 5 years ago. His smoking use included Cigarettes. He does not have any smokeless tobacco history on file. He reports that he does not drink alcohol or use illicit drugs. family history includes COPD in his father and Cancer in his mother.  There is no history of Colon cancer. Allergies  Allergen Reactions  . Amoxicillin     REACTION: unspecified  . Penicillins Swelling    LIPS        Objective:   Physical Exam  Constitutional: He appears well-developed and well-nourished.  HENT:  Head: Normocephalic and atraumatic.  Eyes: Conjunctivae are normal. Pupils are equal, round, and reactive to light.  Neck: Normal range of motion. Neck supple.  Cardiovascular: Normal rate and regular rhythm.   Pulmonary/Chest: Effort normal and breath sounds normal.  Abdominal: Soft. Bowel  sounds are normal.          Assessment & Plan:  Severe degenerative joint disease His knees were prepped and advanced informed consent obtained these were injected per protocol see problem focused exam.  Patient's blood pressure stable his current  medications his breathing is excellent indicating no flare of his COPD his weight is stable he is exercising and active on a regular basis his blood pressure stable appropriate lab work was drawn at last visit copy of his blood work was given to him with explanation of the values

## 2011-07-14 ENCOUNTER — Encounter: Payer: Self-pay | Admitting: Gastroenterology

## 2011-07-16 ENCOUNTER — Telehealth: Payer: Self-pay | Admitting: Gastroenterology

## 2011-07-16 NOTE — Telephone Encounter (Signed)
h pylori is neg

## 2011-07-24 ENCOUNTER — Other Ambulatory Visit: Payer: Self-pay | Admitting: Internal Medicine

## 2011-08-11 ENCOUNTER — Other Ambulatory Visit: Payer: Self-pay | Admitting: Internal Medicine

## 2011-08-28 LAB — BASIC METABOLIC PANEL
Calcium: 9.1
GFR calc Af Amer: >60
GFR calc non Af Amer: >60
Glucose, Bld: 151 — ABNORMAL HIGH
Potassium: 3.9
Sodium: 139

## 2011-09-11 ENCOUNTER — Telehealth: Payer: Self-pay | Admitting: Internal Medicine

## 2011-09-11 NOTE — Telephone Encounter (Signed)
Pharmacist called and rcvd script via fax for Azithromycin, it doesn't day anything about refilling med or instructions.

## 2011-09-11 NOTE — Telephone Encounter (Signed)
We have not sent anything for this pt and [facility] was notified

## 2011-09-18 ENCOUNTER — Ambulatory Visit (INDEPENDENT_AMBULATORY_CARE_PROVIDER_SITE_OTHER): Payer: Medicare Other | Admitting: Cardiovascular Disease

## 2011-09-18 ENCOUNTER — Encounter: Payer: Self-pay | Admitting: Cardiovascular Disease

## 2011-09-18 VITALS — BP 132/66 | HR 70 | Resp 18 | Ht 68.0 in | Wt 148.0 lb

## 2011-09-18 DIAGNOSIS — J40 Bronchitis, not specified as acute or chronic: Secondary | ICD-10-CM

## 2011-09-18 DIAGNOSIS — I10 Essential (primary) hypertension: Secondary | ICD-10-CM

## 2011-09-18 DIAGNOSIS — I2510 Atherosclerotic heart disease of native coronary artery without angina pectoris: Secondary | ICD-10-CM

## 2011-09-18 MED ORDER — AZITHROMYCIN 250 MG PO TABS: ORAL_TABLET | ORAL | Status: AC

## 2011-09-18 NOTE — Assessment & Plan Note (Signed)
Stable No changes 

## 2011-09-18 NOTE — Patient Instructions (Signed)
Your physician wants you to follow-up in: 12 months.  You will receive a reminder letter in the mail two months in advance. If you don't receive a letter, please call our office to schedule the follow-up appointment.  Take z-pack as directed.

## 2011-09-18 NOTE — Progress Notes (Signed)
History of Present Illness:77 yo WM with past medical history significant for CAD, HTN, anemia, asthma/COPD, GERD, OA, gout hypothyroidism, hiatal hernia and alcohol abuse seen as a new patient 03/05/09 for further evaluation of atypical chest pain, new LBBB and abnormal Myoview. Cardiac cath was performed on 03/13/09 and we found a 70-80% heavily calcified mid LAD that was severely diseased throughout the mid section with heavy calcification. There was diffuse moderately obstructive disease in the other vessels. I discussed his disease with him in the hospital and we elected for medical management given that he was asymptomatic. No stents were placed.   He returns today for follow up. He has been doing well. He has been working around the yard in the garden and in the house without any chest pain. His breathing has been at baseline. He has a cough and feels that he has a "chest cold". No fever or chills.   Past Medical History  Diagnosis Date  . HYPOTHYROIDISM 05/31/2007  . HYPERLIPIDEMIA 03/27/2009  . GOUT 05/31/2007  . ANEMIA, B12 DEFICIENCY 03/29/2008  . Anxiety state, unspecified 11/19/2008  . ABUSE, ALCOHOL, IN REMISSION 05/31/2007  . CARPAL TUNNEL SYNDROME, RIGHT 09/05/2007  . 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 HYPERTENSION 05/31/2007  . CAD, NATIVE VESSEL 04/04/2009  . LEFT BUNDLE BRANCH BLOCK 02/11/2009  . SINUSITIS, CHRONIC NOS 05/31/2007  . ASTHMA 05/31/2007  . GERD 05/31/2007  . Chronic prostatitis 03/29/2008  . LOC OSTEOARTHROS NOT SPEC PRIM/SEC LOWER LEG 03/20/2010  . OSTEOARTHRITIS 05/31/2007  . HIP PAIN 03/20/2010  . DEGENERATIVE DISC DISEASE, CERVICAL SPINE 06/22/2007  . Adhesive capsulitis of shoulder 12/29/2007  . SYNCOPE 11/19/2008  . WEIGHT LOSS, RECENT 11/19/2008  . DYSPHAGIA UNSPECIFIED 09/25/2008  . Hepatomegaly 09/25/2008  . PSA, INCREASED 03/20/2010  . ABNORMAL CV (STRESS) TEST 03/05/2009  . PERSONAL HX COLONIC POLYPS 03/05/1997    adenomatous   . Liver mass, left lobe   . Hiatal hernia    . Esophageal stricture     Past Surgical History  Procedure Date  . Pul. colapse w/chest tube   . Tonsillectomy   . Carpel tunnel release     Current Outpatient Prescriptions  Medication Sig Dispense Refill  . albuterol (PROAIR HFA) 108 (90 BASE) MCG/ACT inhaler Inhale 2 puffs into the lungs every 6 (six) hours as needed.        . ALPRAZolam (XANAX) 0.25 MG tablet Take 1 tablet (0.25 mg total) by mouth every 6 (six) hours as needed.  60 tablet  3  . aspirin 81 MG tablet Take 81 mg by mouth daily.        . cyanocobalamin 100 MCG tablet Take 100 mcg by mouth daily.        . diclofenac (VOLTAREN) 75 MG EC tablet Take 75 mg by mouth daily as needed.        Marland Kitchen levothyroxine (SYNTHROID, LEVOTHROID) 75 MCG tablet Take 75 mcg by mouth daily.        Marland Kitchen levothyroxine (SYNTHROID, LEVOTHROID) 75 MCG tablet TAKE ONE TABLET BY MOUTH ONE TIME DAILY  30 tablet  4  . metoprolol tartrate (LOPRESSOR) 25 MG tablet Take 12.5 mg by mouth 2 (two) times daily.       . montelukast (SINGULAIR) 10 MG tablet Take 1 tablet (10 mg total) by mouth at bedtime.  30 tablet  11  . omeprazole (PRILOSEC) 20 MG capsule TAKE ONE CAPSULE BY MOUTH ONE TIME DAILY  30 capsule  9  . rosuvastatin (CRESTOR) 10 MG tablet Take  10 mg by mouth daily.        . valsartan (DIOVAN) 320 MG tablet Take 1 tablet (320 mg total) by mouth daily.  30 tablet  6  . esomeprazole (NEXIUM) 40 MG capsule Take 1 capsule (40 mg total) by mouth daily.  30 capsule  1  . saw palmetto 80 MG capsule Take 1 capsule (80 mg total) by mouth 2 (two) times daily.       Current Facility-Administered Medications  Medication Dose Route Frequency Provider Last Rate Last Dose  . methylPREDNISolone acetate (DEPO-MEDROL) injection 40 mg  40 mg Intra-articular Once Carrie Mew        Allergies  Allergen Reactions  . Amoxicillin     REACTION: unspecified  . Penicillins Swelling    LIPS    History   Social History  . Marital Status: Married    Spouse  Name: N/A    Number of Children: N/A  . Years of Education: N/A   Occupational History  . retired    Social History Main Topics  . Smoking status: Former Smoker    Types: Cigarettes    Quit date: 12/27/2005  . Smokeless tobacco: Not on file  . Alcohol Use: No  . Drug Use: No  . Sexually Active: Not Currently   Other Topics Concern  . Not on file   Social History Narrative  . No narrative on file    Family History  Problem Relation Age of Onset  . Cancer Mother     In back but unsure of what type of cancer  . COPD Father   . Colon cancer Neg Hx     Review of Systems:  As stated in the HPI and otherwise negative.   BP 132/66  Pulse 70  Resp 18  Ht 5\' 8"  (1.727 m)  Wt 148 lb (67.132 kg)  BMI 22.50 kg/m2  Physical Examination: General: Well developed, well nourished, NAD HEENT: OP clear, mucus membranes moist SKIN: warm, dry. No rashes. Neuro: No focal deficits Musculoskeletal: Muscle strength 5/5 all ext Psychiatric: Mood and affect normal Neck: No JVD, no carotid bruits, no thyromegaly, no lymphadenopathy. Lungs:Clear bilaterally, no wheezes, rhonci, crackles Cardiovascular: Regular rate and rhythm. No murmurs, gallops or rubs. Abdomen:Soft. Bowel sounds present. Non-tender.  Extremities: No lower extremity edema. Pulses are 2 + in the bilateral DP/PT.

## 2011-09-18 NOTE — Assessment & Plan Note (Signed)
Stable. Continue current meds.   

## 2011-09-24 ENCOUNTER — Ambulatory Visit (INDEPENDENT_AMBULATORY_CARE_PROVIDER_SITE_OTHER): Payer: Medicare Other | Admitting: Internal Medicine

## 2011-09-24 ENCOUNTER — Encounter: Payer: Self-pay | Admitting: Internal Medicine

## 2011-09-24 VITALS — BP 130/78 | HR 76 | Temp 98.1°F | Resp 16 | Ht 68.0 in | Wt 148.0 lb

## 2011-09-24 DIAGNOSIS — M109 Gout, unspecified: Secondary | ICD-10-CM

## 2011-09-24 DIAGNOSIS — J45909 Unspecified asthma, uncomplicated: Secondary | ICD-10-CM

## 2011-09-24 DIAGNOSIS — E039 Hypothyroidism, unspecified: Secondary | ICD-10-CM

## 2011-09-24 DIAGNOSIS — I10 Essential (primary) hypertension: Secondary | ICD-10-CM

## 2011-09-24 DIAGNOSIS — Z0000 Encounter for general adult medical examination without abnormal findings: Secondary | ICD-10-CM

## 2011-09-24 DIAGNOSIS — Z23 Encounter for immunization: Secondary | ICD-10-CM

## 2011-09-24 DIAGNOSIS — K219 Gastro-esophageal reflux disease without esophagitis: Secondary | ICD-10-CM

## 2011-09-24 NOTE — Progress Notes (Signed)
Subjective:    Patient ID: Albert Allen, male    DOB: [birth year], 77 y.o.   MRN: [id number]  HPI  It is a 18-year-old white male followed for hypertension hyperlipidemia and gouty arthritis and hypothyroidism.  He states he's been doing well he has not had any flares of his asthma he has had a flare of allergic rhinitis/sinusitis was treated by the cardiologist with a Z-Pak and some persistent symptoms which are more consistent with allergic rhinitis.  He denies any chest pain shortness of breath PND orthopnea. He states that he has had y a flare of his gout but took a colchicine and the symptoms abated quickly.    Review of Systems  Constitutional: Negative for fever and fatigue.  HENT: Negative for hearing loss, congestion, neck pain and postnasal drip.   Eyes: Negative for discharge, redness and visual disturbance.  Respiratory: Negative for cough, shortness of breath and wheezing.   Cardiovascular: Negative for leg swelling.  Gastrointestinal: Negative for abdominal pain, constipation and abdominal distention.  Genitourinary: Negative for urgency and frequency.  Musculoskeletal: Negative for joint swelling and arthralgias.  Skin: Negative for color change and rash.  Neurological: Negative for weakness and light-headedness.  Hematological: Negative for adenopathy.  Psychiatric/Behavioral: Negative for behavioral problems.   Past Medical History  Diagnosis Date  . HYPOTHYROIDISM 05/31/2007  . HYPERLIPIDEMIA 03/27/2009  . GOUT 05/31/2007  . ANEMIA, B12 DEFICIENCY 03/29/2008  . Anxiety state, unspecified 11/19/2008  . ABUSE, ALCOHOL, IN REMISSION 05/31/2007  . CARPAL TUNNEL SYNDROME, RIGHT 09/05/2007  . 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 HYPERTENSION 05/31/2007  . CAD, NATIVE VESSEL 04/04/2009  . LEFT BUNDLE BRANCH BLOCK 02/11/2009  . SINUSITIS, CHRONIC NOS 05/31/2007  . ASTHMA 05/31/2007  . GERD 05/31/2007  . Chronic prostatitis 03/29/2008  . LOC OSTEOARTHROS NOT SPEC PRIM/SEC LOWER  LEG 03/20/2010  . OSTEOARTHRITIS 05/31/2007  . HIP PAIN 03/20/2010  . DEGENERATIVE DISC DISEASE, CERVICAL SPINE 06/22/2007  . Adhesive capsulitis of shoulder 12/29/2007  . SYNCOPE 11/19/2008  . WEIGHT LOSS, RECENT 11/19/2008  . DYSPHAGIA UNSPECIFIED 09/25/2008  . Hepatomegaly 09/25/2008  . PSA, INCREASED 03/20/2010  . ABNORMAL CV (STRESS) TEST 03/05/2009  . PERSONAL HX COLONIC POLYPS 03/05/1997    adenomatous   . Liver mass, left lobe   . Hiatal hernia   . Esophageal stricture    Past Surgical History  Procedure Date  . Pul. colapse w/chest tube   . Tonsillectomy   . Carpel tunnel release     reports that he quit smoking about 5 years ago. His smoking use included Cigarettes. He does not have any smokeless tobacco history on file. He reports that he does not drink alcohol or use illicit drugs. family history includes COPD in his father and Cancer in his mother.  There is no history of Colon cancer. Allergies  Allergen Reactions  . Amoxicillin     REACTION: unspecified  . Penicillins Swelling    LIPS       Objective:   Physical Exam  Nursing note reviewed. Constitutional: He appears well-developed and well-nourished.  HENT:  Head: Normocephalic and atraumatic.  Eyes: Conjunctivae are normal. Pupils are equal, round, and reactive to light.  Neck: Normal range of motion. Neck supple.  Cardiovascular: Normal rate and regular rhythm.   Pulmonary/Chest: Effort normal and breath sounds normal.  Abdominal: Soft. Bowel sounds are normal.          Assessment & Plan:  Patient has hypothyroidism and is stable on current medications.  He  has a history of gouty arthritis had one attack in the interim since his last office visit.  He has well-controlled blood pressure.  He has well-controlled asthma  He has had a flare of his allergic rhinitis and we have given him a nasal steroid to use for symptomatic relief.

## 2011-09-24 NOTE — Patient Instructions (Signed)
Patient was instructed to continue all medications as prescribed. To stop at the checkout desk and schedule a followup appointment  

## 2011-11-13 ENCOUNTER — Other Ambulatory Visit: Payer: Self-pay | Admitting: Internal Medicine

## 2011-12-07 ENCOUNTER — Other Ambulatory Visit: Payer: Self-pay | Admitting: Internal Medicine

## 2012-01-12 ENCOUNTER — Other Ambulatory Visit (INDEPENDENT_AMBULATORY_CARE_PROVIDER_SITE_OTHER): Payer: Medicare Other

## 2012-01-12 DIAGNOSIS — E785 Hyperlipidemia, unspecified: Secondary | ICD-10-CM

## 2012-01-12 DIAGNOSIS — Z0000 Encounter for general adult medical examination without abnormal findings: Secondary | ICD-10-CM

## 2012-01-12 DIAGNOSIS — Z125 Encounter for screening for malignant neoplasm of prostate: Secondary | ICD-10-CM

## 2012-01-12 LAB — HEPATIC FUNCTION PANEL
Alkaline Phosphatase: 66 U/L (ref 39–117)
Bilirubin, Direct: 0.1 mg/dL (ref 0.0–0.3)
Total Protein: 6.7 g/dL (ref 6.0–8.3)

## 2012-01-12 LAB — PSA: PSA: 1.42 ng/mL (ref 0.10–4.00)

## 2012-01-12 LAB — LIPID PANEL
HDL: 41.8 mg/dL (ref >39.00)
VLDL: 41.2 mg/dL — ABNORMAL HIGH (ref 0.0–40.0)

## 2012-01-12 LAB — CBC WITH DIFFERENTIAL/PLATELET
Basophils Relative: 0.3 % (ref 0.0–3.0)
Eosinophils Relative: 2.6 % (ref 0.0–5.0)
Lymphocytes Relative: 47.7 % — ABNORMAL HIGH (ref 12.0–46.0)
Monocytes Relative: 14.1 % — ABNORMAL HIGH (ref 3.0–12.0)
Neutrophils Relative %: 35.3 % — ABNORMAL LOW (ref 43.0–77.0)
RBC: 3.9 Mil/uL — ABNORMAL LOW (ref 4.22–5.81)
WBC: 3.6 10*3/uL — ABNORMAL LOW (ref 4.5–10.5)

## 2012-01-12 LAB — LDL CHOLESTEROL, DIRECT: Direct LDL: 67.7 mg/dL

## 2012-01-12 LAB — POCT URINALYSIS DIPSTICK
Leukocytes, UA: NEGATIVE
Nitrite, UA: NEGATIVE
Protein, UA: NEGATIVE
Urobilinogen, UA: 0.2
pH, UA: 5.5

## 2012-01-12 LAB — BASIC METABOLIC PANEL
CO2: 28 mEq/L (ref 19–32)
Calcium: 9.6 mg/dL (ref 8.4–10.5)
GFR: 93.21 mL/min (ref >60.00)
Sodium: 139 mEq/L (ref 135–145)

## 2012-01-20 ENCOUNTER — Ambulatory Visit: Payer: Medicare Other | Admitting: Internal Medicine

## 2012-01-22 ENCOUNTER — Ambulatory Visit (INDEPENDENT_AMBULATORY_CARE_PROVIDER_SITE_OTHER): Payer: Medicare Other | Admitting: Internal Medicine

## 2012-01-22 ENCOUNTER — Encounter: Payer: Self-pay | Admitting: Internal Medicine

## 2012-01-22 DIAGNOSIS — J449 Chronic obstructive pulmonary disease, unspecified: Secondary | ICD-10-CM

## 2012-01-22 DIAGNOSIS — M542 Cervicalgia: Secondary | ICD-10-CM

## 2012-01-22 DIAGNOSIS — K219 Gastro-esophageal reflux disease without esophagitis: Secondary | ICD-10-CM

## 2012-01-22 DIAGNOSIS — Z0000 Encounter for general adult medical examination without abnormal findings: Secondary | ICD-10-CM

## 2012-01-22 MED ORDER — CARISOPRODOL 350 MG PO TABS: 350.0000 mg | ORAL_TABLET | Freq: Every day | ORAL | Status: AC

## 2012-01-22 MED ORDER — ALBUTEROL SULFATE HFA 108 (90 BASE) MCG/ACT IN AERS: 2.0000 | INHALATION_SPRAY | Freq: Four times a day (QID) | RESPIRATORY_TRACT | Status: DC | PRN

## 2012-01-22 NOTE — Progress Notes (Signed)
Addended by: Stacie Glaze on: 01/22/2012 09:25 AM   Modules accepted: Orders

## 2012-01-22 NOTE — Patient Instructions (Signed)
Your PSA is stable and we will continue to monitor this for you you'll not need to see the urologist this year  I would stay on the Prilosec at this time as it protects you from worsening her asthma and preventing stricture in your esophagus
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
## 2012-02-11 ENCOUNTER — Other Ambulatory Visit: Payer: Self-pay | Admitting: Internal Medicine

## 2012-03-24 ENCOUNTER — Encounter: Payer: Self-pay | Admitting: Internal Medicine

## 2012-03-24 ENCOUNTER — Ambulatory Visit (INDEPENDENT_AMBULATORY_CARE_PROVIDER_SITE_OTHER): Payer: Medicare Other | Admitting: Internal Medicine

## 2012-03-24 VITALS — BP 120/80 | HR 72 | Temp 98.2°F | Resp 16 | Ht 68.0 in | Wt 142.0 lb

## 2012-03-24 DIAGNOSIS — I10 Essential (primary) hypertension: Secondary | ICD-10-CM

## 2012-03-24 DIAGNOSIS — W19XXXA Unspecified fall, initial encounter: Secondary | ICD-10-CM

## 2012-03-24 DIAGNOSIS — M1710 Unilateral primary osteoarthritis, unspecified knee: Secondary | ICD-10-CM

## 2012-03-24 DIAGNOSIS — M169 Osteoarthritis of hip, unspecified: Secondary | ICD-10-CM

## 2012-03-24 DIAGNOSIS — M19019 Primary osteoarthritis, unspecified shoulder: Secondary | ICD-10-CM

## 2012-03-24 MED ORDER — METHYLPREDNISOLONE ACETATE 40 MG/ML IJ SUSP: 40.0000 mg | Freq: Once | INTRAMUSCULAR | Status: DC

## 2012-03-24 NOTE — Progress Notes (Signed)
Addended by: Stacie Glaze on: 03/24/2012 10:45 AM   Modules accepted: Orders

## 2012-03-24 NOTE — Progress Notes (Signed)
Subjective:    Patient ID: Albert Allen, male    DOB: June 17, 1923, 78 y.o.   MRN: [id number]  HPI The pt has been on prilosec and the PA and [facility] changes him to nexium but there has been no change in his hoarseness. He had a a hx of stricture He has treated HTN Weight is stable appetitive is variable Diagnosis was labyrinthitis Has hip and shoulder pain on the right effecting balance and gate    Review of Systems  Constitutional: Negative for fever and fatigue.  HENT: Negative for hearing loss, congestion, neck pain and postnasal drip.   Eyes: Negative for discharge, redness and visual disturbance.  Respiratory: Negative for cough, shortness of breath and wheezing.   Cardiovascular: Negative for leg swelling.  Gastrointestinal: Negative for abdominal pain, constipation and abdominal distention.  Genitourinary: Negative for urgency and frequency.  Musculoskeletal: Negative for joint swelling and arthralgias.  Skin: Negative for color change and rash.  Neurological: Positive for weakness and light-headedness.  Hematological: Negative for adenopathy.  Psychiatric/Behavioral: Negative for behavioral problems.     Past Medical History  Diagnosis Date  . HYPOTHYROIDISM 05/31/2007  . HYPERLIPIDEMIA 03/27/2009  . GOUT 05/31/2007  . ANEMIA, B12 DEFICIENCY 03/29/2008  . Anxiety state, unspecified 11/19/2008  . ABUSE, ALCOHOL, IN REMISSION 05/31/2007  . CARPAL TUNNEL SYNDROME, RIGHT 09/05/2007  . 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 HYPERTENSION 05/31/2007  . CAD, NATIVE VESSEL 04/04/2009  . LEFT BUNDLE BRANCH BLOCK 02/11/2009  . SINUSITIS, CHRONIC NOS 05/31/2007  . ASTHMA 05/31/2007  . GERD 05/31/2007  . Chronic prostatitis 03/29/2008  . LOC OSTEOARTHROS NOT SPEC PRIM/SEC LOWER LEG 03/20/2010  . OSTEOARTHRITIS 05/31/2007  . HIP PAIN 03/20/2010  . DEGENERATIVE DISC DISEASE, CERVICAL SPINE 06/22/2007  . Adhesive capsulitis of shoulder 12/29/2007  . SYNCOPE 11/19/2008  . WEIGHT LOSS, RECENT  11/19/2008  . DYSPHAGIA UNSPECIFIED 09/25/2008  . Hepatomegaly 09/25/2008  . PSA, INCREASED 03/20/2010  . ABNORMAL CV (STRESS) TEST 03/05/2009  . PERSONAL HX COLONIC POLYPS 03/05/1997    adenomatous   . Liver mass, left lobe   . Hiatal hernia   . Esophageal stricture     History   Social History  . Marital Status: Married    Spouse Name: N/A    Number of Children: N/A  . Years of Education: N/A   Occupational History  . retired    Social History Main Topics  . Smoking status: Former Smoker    Types: Cigarettes    Quit date: 12/27/2005  . Smokeless tobacco: Not on file  . Alcohol Use: No  . Drug Use: No  . Sexually Active: Not Currently   Other Topics Concern  . Not on file   Social History Narrative  . No narrative on file    Past Surgical History  Procedure Date  . Pul. colapse w/chest tube   . Tonsillectomy   . Carpel tunnel release     Family History  Problem Relation Age of Onset  . Cancer Mother     In back but unsure of what type of cancer  . COPD Father   . Colon cancer Neg Hx     Allergies  Allergen Reactions  . Amoxicillin     REACTION: unspecified  . Penicillins Swelling    LIPS    Current Outpatient Prescriptions on File Prior to Visit  Medication Sig Dispense Refill  . albuterol (PROAIR HFA) 108 (90 BASE) MCG/ACT inhaler Inhale 2 puffs into the lungs every 6 (six) hours as  needed.  6.7 g  11  . ALPRAZolam (XANAX) 0.25 MG tablet TAKE ONE TABLET BY MOUTH EVERY SIX HOURS AS NEEDED  60 tablet  2  . aspirin 81 MG tablet Take 81 mg by mouth daily.        . cyanocobalamin 100 MCG tablet Take 100 mcg by mouth daily.        . diclofenac (VOLTAREN) 75 MG EC tablet TAKE ONE TABLET BY MOUTH DAILY AS NEEDED  30 tablet  2  . levothyroxine (SYNTHROID, LEVOTHROID) 75 MCG tablet Take 75 mcg by mouth daily.        . montelukast (SINGULAIR) 10 MG tablet Take 1 tablet (10 mg total) by mouth at bedtime.  30 tablet  11  . omeprazole (PRILOSEC) 20 MG capsule  TAKE ONE CAPSULE BY MOUTH ONE TIME DAILY  30 capsule  9  . rosuvastatin (CRESTOR) 10 MG tablet Take 10 mg by mouth daily.        Marland Kitchen telmisartan (MICARDIS) 80 MG tablet Take 80 mg by mouth daily.      Marland Kitchen DISCONTD: metoprolol tartrate (LOPRESSOR) 25 MG tablet TAKE ONE TABLET BY MOUTH TWICE DAILY  60 tablet  4  . DISCONTD: levothyroxine (SYNTHROID, LEVOTHROID) 75 MCG tablet TAKE ONE TABLET BY MOUTH ONE TIME DAILY  30 tablet  4   Current Facility-Administered Medications on File Prior to Visit  Medication Dose Route Frequency Provider Last Rate Last Dose  . methylPREDNISolone acetate (DEPO-MEDROL) injection 40 mg  40 mg Intra-articular Once Stacie Glaze, MD        BP 120/80  Pulse 72  Temp 98.2 F (36.8 C)  Resp 16  Ht 5\' 8"  (1.727 m)  Wt 142 lb (64.411 kg)  BMI 21.59 kg/m2        Objective:   Physical Exam  Nursing note and vitals reviewed. Constitutional: He appears well-developed and well-nourished.  HENT:  Head: Normocephalic and atraumatic.  Eyes: Conjunctivae are normal. Pupils are equal, round, and reactive to light.  Neck: Normal range of motion. Neck supple.  Cardiovascular: Normal rate and regular rhythm.   Pulmonary/Chest: Effort normal and breath sounds normal.  Abdominal: Soft. Bowel sounds are normal.  Skin: Skin is warm and dry.  Psychiatric: He has a normal mood and affect. His behavior is normal.          Assessment & Plan:  Refer to physical therapy for evaluation for fall prevention and strengthening as well as balance training Pressure stable at current medication multifactorial dizziness with COPD  Severe OA in hip and shoulder   Informed consent obtained and the patient's left hip was prepped with betadine. Local anesthesia was obtained with topical spray. Then 40 mg of Depo-Medrol and 1/2 cc of lidocaine was injected into the joint space. The patient tolerated the procedure without complications. Post injection care discussed with patient.     Informed consent obtained and the patient's left shoulder was prepped with betadine. Local anesthesia was obtained with topical spray. Then 40 mg of Depo-Medrol and 1/2 cc of lidocaine was injected into the joint space. The patient tolerated the procedure without complications. Post injection care discussed with patient.

## 2012-06-08 ENCOUNTER — Other Ambulatory Visit: Payer: Self-pay | Admitting: *Deleted

## 2012-06-08 MED ORDER — ALPRAZOLAM 0.25 MG PO TABS: 0.2500 mg | ORAL_TABLET | Freq: Four times a day (QID) | ORAL | Status: DC | PRN

## 2012-06-23 ENCOUNTER — Ambulatory Visit (INDEPENDENT_AMBULATORY_CARE_PROVIDER_SITE_OTHER): Payer: Medicare Other | Admitting: Internal Medicine

## 2012-06-23 ENCOUNTER — Encounter: Payer: Self-pay | Admitting: Internal Medicine

## 2012-06-23 VITALS — BP 136/80 | HR 72 | Temp 98.0°F | Resp 16 | Ht 68.0 in | Wt 142.0 lb

## 2012-06-23 DIAGNOSIS — M25559 Pain in unspecified hip: Secondary | ICD-10-CM

## 2012-06-23 DIAGNOSIS — M542 Cervicalgia: Secondary | ICD-10-CM

## 2012-06-23 DIAGNOSIS — K219 Gastro-esophageal reflux disease without esophagitis: Secondary | ICD-10-CM

## 2012-06-23 DIAGNOSIS — I10 Essential (primary) hypertension: Secondary | ICD-10-CM

## 2012-06-23 DIAGNOSIS — F1011 Alcohol abuse, in remission: Secondary | ICD-10-CM

## 2012-06-23 DIAGNOSIS — G621 Alcoholic polyneuropathy: Secondary | ICD-10-CM

## 2012-06-23 MED ORDER — LOSARTAN POTASSIUM 100 MG PO TABS: 100.0000 mg | ORAL_TABLET | Freq: Every day | ORAL | Status: DC

## 2012-06-23 MED ORDER — METHYLPREDNISOLONE ACETATE 40 MG/ML IJ SUSP: 40.0000 mg | Freq: Once | INTRAMUSCULAR | Status: DC

## 2012-06-23 NOTE — Progress Notes (Signed)
Subjective:    Patient ID: Albert Allen, male    DOB: Feb 01, 1923, 78 y.o.   MRN: [id number]  HPI Patient is an 78 year old male who presents for followup of hypothyroidism hypertension hyperlipidemia and discussion of the fact that he did not go to physical therapy after recent fall he was referred to balance physical therapy and did not complete the referral because there was a secondary cause of his fall that he did not reveal to [place] at that time.  He admits that he fell because of alcohol use and he was concerned since he had been in remission from his alcoholism that we would see him in a poor light.  Today he has a primary complaint of neck pain and trapezius spasm and he is requesting a point injection   Review of Systems  Constitutional: Negative for fever and fatigue.  HENT: Positive for neck pain and neck stiffness. Negative for hearing loss, congestion and postnasal drip.   Eyes: Negative for discharge, redness and visual disturbance.  Respiratory: Negative for cough, shortness of breath and wheezing.   Cardiovascular: Negative for leg swelling.  Gastrointestinal: Negative for abdominal pain, constipation and abdominal distention.  Genitourinary: Negative for urgency and frequency.  Musculoskeletal: Negative for joint swelling and arthralgias.  Skin: Negative for color change and rash.  Neurological: Negative for weakness and light-headedness.  Hematological: Negative for adenopathy.  Psychiatric/Behavioral: Negative for behavioral problems.   Past Medical History  Diagnosis Date  . HYPOTHYROIDISM 05/31/2007  . HYPERLIPIDEMIA 03/27/2009  . GOUT 05/31/2007  . ANEMIA, B12 DEFICIENCY 03/29/2008  . Anxiety state, unspecified 11/19/2008  . ABUSE, ALCOHOL, IN REMISSION 05/31/2007  . CARPAL TUNNEL SYNDROME, RIGHT 09/05/2007  . 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 HYPERTENSION 05/31/2007  . CAD, NATIVE VESSEL 04/04/2009  . LEFT BUNDLE BRANCH BLOCK 02/11/2009  . SINUSITIS, CHRONIC NOS  05/31/2007  . ASTHMA 05/31/2007  . GERD 05/31/2007  . Chronic prostatitis 03/29/2008  . LOC OSTEOARTHROS NOT SPEC PRIM/SEC LOWER LEG 03/20/2010  . OSTEOARTHRITIS 05/31/2007  . HIP PAIN 03/20/2010  . DEGENERATIVE DISC DISEASE, CERVICAL SPINE 06/22/2007  . Adhesive capsulitis of shoulder 12/29/2007  . SYNCOPE 11/19/2008  . WEIGHT LOSS, RECENT 11/19/2008  . DYSPHAGIA UNSPECIFIED 09/25/2008  . Hepatomegaly 09/25/2008  . PSA, INCREASED 03/20/2010  . ABNORMAL CV (STRESS) TEST 03/05/2009  . PERSONAL HX COLONIC POLYPS 03/05/1997    adenomatous   . Liver mass, left lobe   . Hiatal hernia   . Esophageal stricture     History   Social History  . Marital Status: Married    Spouse Name: N/A    Number of Children: N/A  . Years of Education: N/A   Occupational History  . retired    Social History Main Topics  . Smoking status: Former Smoker    Types: Cigarettes    Quit date: 12/27/2005  . Smokeless tobacco: Not on file  . Alcohol Use: No  . Drug Use: No  . Sexually Active: Not Currently   Other Topics Concern  . Not on file   Social History Narrative  . No narrative on file    Past Surgical History  Procedure Date  . Pul. colapse w/chest tube   . Tonsillectomy   . Carpel tunnel release     Family History  Problem Relation Age of Onset  . Cancer Mother     In back but unsure of what type of cancer  . COPD Father   . Colon cancer Neg Hx  Allergies  Allergen Reactions  . Amoxicillin     REACTION: unspecified  . Penicillins Swelling    LIPS    Current Outpatient Prescriptions on File Prior to Visit  Medication Sig Dispense Refill  . albuterol (PROAIR HFA) 108 (90 BASE) MCG/ACT inhaler Inhale 2 puffs into the lungs every 6 (six) hours as needed.  6.7 g  11  . ALPRAZolam (XANAX) 0.25 MG tablet Take 1 tablet (0.25 mg total) by mouth every 6 (six) hours as needed for sleep.  60 tablet  2  . aspirin 81 MG tablet Take 81 mg by mouth daily.        . cyanocobalamin 100 MCG tablet  Take 100 mcg by mouth daily.        . diclofenac (VOLTAREN) 75 MG EC tablet TAKE ONE TABLET BY MOUTH DAILY AS NEEDED  30 tablet  2  . levothyroxine (SYNTHROID, LEVOTHROID) 75 MCG tablet Take 75 mcg by mouth daily.        . metoprolol tartrate (LOPRESSOR) 25 MG tablet       . montelukast (SINGULAIR) 10 MG tablet Take 1 tablet (10 mg total) by mouth at bedtime.  30 tablet  11  . omeprazole (PRILOSEC) 20 MG capsule TAKE ONE CAPSULE BY MOUTH ONE TIME DAILY  30 capsule  9  . rosuvastatin (CRESTOR) 10 MG tablet Take 10 mg by mouth daily.        Marland Kitchen telmisartan (MICARDIS) 80 MG tablet Take 80 mg by mouth daily.       Current Facility-Administered Medications on File Prior to Visit  Medication Dose Route Frequency Provider Last Rate Last Dose  . methylPREDNISolone acetate (DEPO-MEDROL) injection 40 mg  40 mg Intra-articular Once Stacie Glaze, MD      . methylPREDNISolone acetate (DEPO-MEDROL) injection 40 mg  40 mg Intra-articular Once Stacie Glaze, MD        BP 136/80  Pulse 72  Temp 98 F (36.7 C)  Resp 16  Ht 5\' 8"  (1.727 m)  Wt 142 lb (64.411 kg)  BMI 21.59 kg/m2        Objective:   Physical Exam  Nursing note and vitals reviewed. Constitutional: He appears well-developed and well-nourished.  HENT:  Head: Normocephalic and atraumatic.  Eyes: Conjunctivae are normal. Pupils are equal, round, and reactive to light.  Neck: Normal range of motion. Neck supple.  Cardiovascular: Normal rate and regular rhythm.   Pulmonary/Chest: Effort normal and breath sounds normal.  Abdominal: Soft. Bowel sounds are normal.  Musculoskeletal: He exhibits edema and tenderness.          Assessment & Plan:  Patient has a point injection in his left trapezius muscle cassette muscle spasms for muscle pain.  Exercises and followup care were discussed as well as a referral to orthopedist if the pain persists.  He was given samples of his Synthroid he appears stable from the standpoint of his  hypothyroidism he was given samples of his blood pressure medicines stable from his blood pressure.  He was also given samples of Crestor and has a stable hyperlipidemia followed by both cardiology and ourselves

## 2012-06-23 NOTE — Patient Instructions (Addendum)
The patient is instructed to continue all medications as prescribed. Schedule followup with check out clerk upon leaving the clinic  

## 2012-07-04 DIAGNOSIS — M79609 Pain in unspecified limb: Secondary | ICD-10-CM

## 2012-07-14 ENCOUNTER — Other Ambulatory Visit: Payer: Self-pay | Admitting: Family Medicine

## 2012-09-10 ENCOUNTER — Other Ambulatory Visit: Payer: Self-pay | Admitting: Internal Medicine

## 2012-09-16 ENCOUNTER — Other Ambulatory Visit: Payer: Self-pay | Admitting: Internal Medicine

## 2012-09-22 ENCOUNTER — Ambulatory Visit (INDEPENDENT_AMBULATORY_CARE_PROVIDER_SITE_OTHER): Payer: Medicare Other | Admitting: Cardiovascular Disease

## 2012-09-22 ENCOUNTER — Encounter: Payer: Self-pay | Admitting: Internal Medicine

## 2012-09-22 ENCOUNTER — Ambulatory Visit (INDEPENDENT_AMBULATORY_CARE_PROVIDER_SITE_OTHER): Payer: Medicare Other | Admitting: Internal Medicine

## 2012-09-22 ENCOUNTER — Encounter: Payer: Self-pay | Admitting: Cardiovascular Disease

## 2012-09-22 VITALS — BP 190/77 | HR 60 | Ht 68.0 in | Wt 143.0 lb

## 2012-09-22 VITALS — BP 170/80 | HR 68 | Temp 98.2°F | Resp 18 | Ht 68.0 in | Wt 143.0 lb

## 2012-09-22 DIAGNOSIS — E785 Hyperlipidemia, unspecified: Secondary | ICD-10-CM

## 2012-09-22 DIAGNOSIS — E039 Hypothyroidism, unspecified: Secondary | ICD-10-CM

## 2012-09-22 DIAGNOSIS — M109 Gout, unspecified: Secondary | ICD-10-CM

## 2012-09-22 DIAGNOSIS — D518 Other vitamin B12 deficiency anemias: Secondary | ICD-10-CM

## 2012-09-22 DIAGNOSIS — I2510 Atherosclerotic heart disease of native coronary artery without angina pectoris: Secondary | ICD-10-CM

## 2012-09-22 DIAGNOSIS — Z23 Encounter for immunization: Secondary | ICD-10-CM

## 2012-09-22 DIAGNOSIS — E783 Hyperchylomicronemia: Secondary | ICD-10-CM

## 2012-09-22 DIAGNOSIS — I10 Essential (primary) hypertension: Secondary | ICD-10-CM

## 2012-09-22 LAB — CBC WITH DIFFERENTIAL/PLATELET
Basophils Relative: 0.5 % (ref 0.0–3.0)
Eosinophils Relative: 1.9 % (ref 0.0–5.0)
HCT: 34.9 % — ABNORMAL LOW (ref 39.0–52.0)
Hemoglobin: 11.7 g/dL — ABNORMAL LOW (ref 13.0–17.0)
Lymphs Abs: 1.2 10*3/uL (ref 0.7–4.0)
MCV: 101.8 fl — ABNORMAL HIGH (ref 78.0–100.0)
Monocytes Absolute: 0.5 10*3/uL (ref 0.1–1.0)
Monocytes Relative: 13.4 % — ABNORMAL HIGH (ref 3.0–12.0)
RBC: 3.43 Mil/uL — ABNORMAL LOW (ref 4.22–5.81)
WBC: 4 10*3/uL — ABNORMAL LOW (ref 4.5–10.5)

## 2012-09-22 LAB — BASIC METABOLIC PANEL
BUN: 11 mg/dL (ref 6–23)
CO2: 29 mEq/L (ref 19–32)
Calcium: 9.3 mg/dL (ref 8.4–10.5)
GFR: 88.14 mL/min (ref >60.00)
Glucose, Bld: 84 mg/dL (ref 70–99)

## 2012-09-22 LAB — POCT URINALYSIS DIPSTICK
Blood, UA: NEGATIVE
Glucose, UA: NEGATIVE
Ketones, UA: NEGATIVE
Protein, UA: NEGATIVE
Spec Grav, UA: 1.01
Urobilinogen, UA: 0.2

## 2012-09-22 LAB — LIPID PANEL
Cholesterol: 164 mg/dL (ref 0–200)
VLDL: 35.2 mg/dL (ref 0.0–40.0)

## 2012-09-22 MED ORDER — LOSARTAN POTASSIUM 100 MG PO TABS: 100.0000 mg | ORAL_TABLET | Freq: Every day | ORAL | Status: DC

## 2012-09-22 MED ORDER — ALLOPURINOL 100 MG PO TABS: 100.0000 mg | ORAL_TABLET | Freq: Every day | ORAL | Status: DC

## 2012-09-22 NOTE — Progress Notes (Signed)
History of Present Illness: 78 yo WM with past medical history significant for CAD, HTN, anemia, asthma/COPD, GERD, OA, gout hypothyroidism, hiatal hernia and alcohol abuse seen as a new patient 03/05/09 for evaluation of atypical chest pain, new LBBB and abnormal Myoview. Cardiac cath was performed on 03/13/09 and we found a 70-80% heavily calcified mid LAD that was severely diseased throughout the mid section with heavy calcification. There was diffuse moderately obstructive disease in the other vessels. I discussed his disease with him in the hospital and we elected for medical management given that he was asymptomatic. No stents were placed. Recent vascular screening with mild bilateral carotid artery disease, ABI of 0.9 right ,0.87 left.   He returns today for follow up. He has had no chest pain or SOB. He fell from a ladder cleaning gutters 10 weeks ago and tells me that he fractured his right lower leg. He had a brace for several weeks. He admits to drinking alchohol daily and he is embarrassed by this. Overall feeling well.   Primary Care Physician: Darryll Capers  Last Lipid Profile:Lipid Panel     Component Value Date/Time   CHOL 143 01/12/2012 0823   TRIG 206.0* 01/12/2012 0823   HDL 41.80 01/12/2012 0823   CHOLHDL 3 01/12/2012 0823   VLDL 41.2* 01/12/2012 0823   LDLCALC 59 01/07/2011 1230     Past Medical History  Diagnosis Date  . HYPOTHYROIDISM 05/31/2007  . HYPERLIPIDEMIA 03/27/2009  . GOUT 05/31/2007  . ANEMIA, B12 DEFICIENCY 03/29/2008  . Anxiety state, unspecified 11/19/2008  . ABUSE, ALCOHOL, IN REMISSION 05/31/2007  . CARPAL TUNNEL SYNDROME, RIGHT 09/05/2007  . 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 HYPERTENSION 05/31/2007  . CAD, NATIVE VESSEL 04/04/2009  . LEFT BUNDLE BRANCH BLOCK 02/11/2009  . SINUSITIS, CHRONIC NOS 05/31/2007  . ASTHMA 05/31/2007  . GERD 05/31/2007  . Chronic prostatitis 03/29/2008  . LOC OSTEOARTHROS NOT SPEC PRIM/SEC LOWER LEG 03/20/2010  . OSTEOARTHRITIS 05/31/2007  .  HIP PAIN 03/20/2010  . DEGENERATIVE DISC DISEASE, CERVICAL SPINE 06/22/2007  . Adhesive capsulitis of shoulder 12/29/2007  . SYNCOPE 11/19/2008  . WEIGHT LOSS, RECENT 11/19/2008  . DYSPHAGIA UNSPECIFIED 09/25/2008  . Hepatomegaly 09/25/2008  . PSA, INCREASED 03/20/2010  . ABNORMAL CV (STRESS) TEST 03/05/2009  . PERSONAL HX COLONIC POLYPS 03/05/1997    adenomatous   . Liver mass, left lobe   . Hiatal hernia   . Esophageal stricture     Past Surgical History  Procedure Date  . Pul. colapse w/chest tube   . Tonsillectomy   . Carpel tunnel release     Current Outpatient Prescriptions  Medication Sig Dispense Refill  . albuterol (PROAIR HFA) 108 (90 BASE) MCG/ACT inhaler Inhale 2 puffs into the lungs every 6 (six) hours as needed.  6.7 g  11  . ALPRAZolam (XANAX) 0.25 MG tablet Take 1 tablet (0.25 mg total) by mouth every 6 (six) hours as needed for sleep.  60 tablet  2  . cyanocobalamin 100 MCG tablet Take 100 mcg by mouth daily.        . diclofenac (VOLTAREN) 75 MG EC tablet TAKE ONE TABLET BY MOUTH DAILY AS NEEDED  30 tablet  2  . levothyroxine (SYNTHROID, LEVOTHROID) 75 MCG tablet Take 75 mcg by mouth daily.        Marland Kitchen losartan (COZAAR) 100 MG tablet Take 50 mg by mouth daily.      . metoprolol tartrate (LOPRESSOR) 25 MG tablet Take 1/2 tab twice a day      .  montelukast (SINGULAIR) 10 MG tablet Take 1 tablet (10 mg total) by mouth at bedtime.  30 tablet  11  . omeprazole (PRILOSEC) 20 MG capsule TAKE ONE CAPSULE BY MOUTH ONE TIME DAILY  30 capsule  8  . rosuvastatin (CRESTOR) 10 MG tablet Take 10 mg by mouth daily.        Marland Kitchen DISCONTD: losartan (COZAAR) 100 MG tablet Take 1 tablet (100 mg total) by mouth daily.  90 tablet  3  . DISCONTD: levothyroxine (SYNTHROID, LEVOTHROID) 75 MCG tablet TAKE ONE TABLET BY MOUTH ONE TIME DAILY  30 tablet  4  . DISCONTD: levothyroxine (SYNTHROID, LEVOTHROID) 75 MCG tablet TAKE ONE TABLET BY MOUTH ONE TIME DAILY  30 tablet  4   Current Facility-Administered  Medications  Medication Dose Route Frequency Provider Last Rate Last Dose  . methylPREDNISolone acetate (DEPO-MEDROL) injection 40 mg  40 mg Intra-articular Once Stacie Glaze, MD      . DISCONTD: methylPREDNISolone acetate (DEPO-MEDROL) injection 40 mg  40 mg Intra-articular Once Stacie Glaze, MD      . DISCONTD: methylPREDNISolone acetate (DEPO-MEDROL) injection 40 mg  40 mg Intra-articular Once Stacie Glaze, MD        Allergies  Allergen Reactions  . Amoxicillin     REACTION: unspecified  . Penicillins Swelling    LIPS    History   Social History  . Marital Status: Married    Spouse Name: N/A    Number of Children: N/A  . Years of Education: N/A   Occupational History  . retired    Social History Main Topics  . Smoking status: Former Smoker    Types: Cigarettes    Quit date: 12/27/2005  . Smokeless tobacco: Not on file  . Alcohol Use: No  . Drug Use: No  . Sexually Active: Not Currently   Other Topics Concern  . Not on file   Social History Narrative  . No narrative on file    Family History  Problem Relation Age of Onset  . Cancer Mother     In back but unsure of what type of cancer  . COPD Father   . Colon cancer Neg Hx     Review of Systems:  As stated in the HPI and otherwise negative.   BP 190/77  Pulse 60  Ht 5\' 8"  (1.727 m)  Wt 143 lb (64.864 kg)  BMI 21.74 kg/m2  Physical Examination: General: Well developed, well nourished, NAD HEENT: OP clear, mucus membranes moist SKIN: warm, dry. No rashes. Neuro: No focal deficits Musculoskeletal: Muscle strength 5/5 all ext Psychiatric: Mood and affect normal Neck: No JVD, no carotid bruits, no thyromegaly, no lymphadenopathy. Lungs:Clear bilaterally, no wheezes, rhonci, crackles Cardiovascular: Regular rate and rhythm. No murmurs, gallops or rubs. Abdomen:Soft. Bowel sounds present. Non-tender.  Extremities: No lower extremity edema. Pulses are 1 + in the bilateral DP/PT.  EKG: Sinus  rhythm, 60 bpm. 1st degree AV block.   Assessment and Plan:   1. CAD: Stable. He has no chest pain or SOB. Continue ASA, statin, beta blocker. Since he is asymptomatic and given his advanced age, will continue conservative management of CAD for now. EKG unchanged this am.   2. HTN: Elevated today. He was recently started on Cozaar by Dr. Lovell Sheehan. Would recommend increasing this to 100 mg daily. He will discuss with Dr. Lovell Sheehan later this am at f/u appt.

## 2012-09-22 NOTE — Patient Instructions (Addendum)
Your physician wants you to follow-up in:  12 months.  You will receive a reminder letter in the mail two months in advance. If you don't receive a letter, please call our office to schedule the follow-up appointment.   

## 2012-09-22 NOTE — Patient Instructions (Signed)
Please take 1 100 mg cozaar a day for blood pressure Have sent in a prescription for allopurinol for  Gout to take before bed

## 2012-09-22 NOTE — Progress Notes (Signed)
Subjective:    Patient ID: Albert Allen, male    DOB: [birth year], 78 y.o.   MRN: [id number]  HPI The patient has noted increased attacks of gout and was given indocin for these attacks. His blood pressure has been effected by the indocin He has increased hoarseness He had a fall and "cracked his knee" has been under orthopedic care    Review of Systems  Constitutional: Positive for fatigue. Negative for fever.  HENT: Positive for voice change. Negative for hearing loss, congestion, neck pain and postnasal drip.   Eyes: Negative for discharge, redness and visual disturbance.  Respiratory: Positive for shortness of breath. Negative for cough and wheezing.   Cardiovascular: Negative for leg swelling.  Gastrointestinal: Negative for abdominal pain, constipation and abdominal distention.  Genitourinary: Negative for urgency and frequency.  Musculoskeletal: Negative for joint swelling and arthralgias.  Skin: Negative for color change and rash.  Neurological: Negative for weakness and light-headedness.  Hematological: Negative for adenopathy.  Psychiatric/Behavioral: Negative for behavioral problems.   Past Medical History  Diagnosis Date  . HYPOTHYROIDISM 05/31/2007  . HYPERLIPIDEMIA 03/27/2009  . GOUT 05/31/2007  . ANEMIA, B12 DEFICIENCY 03/29/2008  . Anxiety state, unspecified 11/19/2008  . ABUSE, ALCOHOL, IN REMISSION 05/31/2007  . CARPAL TUNNEL SYNDROME, RIGHT 09/05/2007  . 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 HYPERTENSION 05/31/2007  . CAD, NATIVE VESSEL 04/04/2009  . LEFT BUNDLE BRANCH BLOCK 02/11/2009  . SINUSITIS, CHRONIC NOS 05/31/2007  . ASTHMA 05/31/2007  . GERD 05/31/2007  . Chronic prostatitis 03/29/2008  . LOC OSTEOARTHROS NOT SPEC PRIM/SEC LOWER LEG 03/20/2010  . OSTEOARTHRITIS 05/31/2007  . HIP PAIN 03/20/2010  . DEGENERATIVE DISC DISEASE, CERVICAL SPINE 06/22/2007  . Adhesive capsulitis of shoulder 12/29/2007  . SYNCOPE 11/19/2008  . WEIGHT LOSS, RECENT 11/19/2008  . DYSPHAGIA  UNSPECIFIED 09/25/2008  . Hepatomegaly 09/25/2008  . PSA, INCREASED 03/20/2010  . ABNORMAL CV (STRESS) TEST 03/05/2009  . PERSONAL HX COLONIC POLYPS 03/05/1997    adenomatous   . Liver mass, left lobe   . Hiatal hernia   . Esophageal stricture     History   Social History  . Marital Status: Married    Spouse Name: N/A    Number of Children: N/A  . Years of Education: N/A   Occupational History  . retired    Social History Main Topics  . Smoking status: Former Smoker    Types: Cigarettes    Quit date: 12/27/2005  . Smokeless tobacco: Not on file  . Alcohol Use: No  . Drug Use: No  . Sexually Active: Not Currently   Other Topics Concern  . Not on file   Social History Narrative  . No narrative on file    Past Surgical History  Procedure Date  . Pul. colapse w/chest tube   . Tonsillectomy   . Carpel tunnel release     Family History  Problem Relation Age of Onset  . Cancer Mother     In back but unsure of what type of cancer  . COPD Father   . Colon cancer Neg Hx     Allergies  Allergen Reactions  . Amoxicillin     REACTION: unspecified  . Penicillins Swelling    LIPS    Current Outpatient Prescriptions on File Prior to Visit  Medication Sig Dispense Refill  . albuterol (PROAIR HFA) 108 (90 BASE) MCG/ACT inhaler Inhale 2 puffs into the lungs every 6 (six) hours as needed.  6.7 g  11  . ALPRAZolam Prudy Feeler)  0.25 MG tablet Take 1 tablet (0.25 mg total) by mouth every 6 (six) hours as needed for sleep.  60 tablet  2  . cyanocobalamin 100 MCG tablet Take 100 mcg by mouth daily.        . diclofenac (VOLTAREN) 75 MG EC tablet TAKE ONE TABLET BY MOUTH DAILY AS NEEDED  30 tablet  2  . levothyroxine (SYNTHROID, LEVOTHROID) 75 MCG tablet Take 75 mcg by mouth daily.        . metoprolol tartrate (LOPRESSOR) 25 MG tablet Take 1/2 tab twice a day      . montelukast (SINGULAIR) 10 MG tablet Take 1 tablet (10 mg total) by mouth at bedtime.  30 tablet  11  . omeprazole  (PRILOSEC) 20 MG capsule TAKE ONE CAPSULE BY MOUTH ONE TIME DAILY  30 capsule  8  . rosuvastatin (CRESTOR) 10 MG tablet Take 10 mg by mouth daily.        Marland Kitchen allopurinol (ZYLOPRIM) 100 MG tablet Take 1 tablet (100 mg total) by mouth daily.  30 tablet  6  . DISCONTD: levothyroxine (SYNTHROID, LEVOTHROID) 75 MCG tablet TAKE ONE TABLET BY MOUTH ONE TIME DAILY  30 tablet  4  . DISCONTD: levothyroxine (SYNTHROID, LEVOTHROID) 75 MCG tablet TAKE ONE TABLET BY MOUTH ONE TIME DAILY  30 tablet  4  . DISCONTD: losartan (COZAAR) 100 MG tablet Take 1 tablet (100 mg total) by mouth daily.  90 tablet  3   Current Facility-Administered Medications on File Prior to Visit  Medication Dose Route Frequency Provider Last Rate Last Dose  . methylPREDNISolone acetate (DEPO-MEDROL) injection 40 mg  40 mg Intra-articular Once Stacie Glaze, MD      . DISCONTD: methylPREDNISolone acetate (DEPO-MEDROL) injection 40 mg  40 mg Intra-articular Once Stacie Glaze, MD      . DISCONTD: methylPREDNISolone acetate (DEPO-MEDROL) injection 40 mg  40 mg Intra-articular Once Stacie Glaze, MD        BP 170/80  Pulse 68  Temp 98.2 F (36.8 C)  Resp 18  Ht 5\' 8"  (1.727 m)  Wt 143 lb (64.864 kg)  BMI 21.74 kg/m2        Objective:   Physical Exam  Nursing note and vitals reviewed. Constitutional: He appears well-developed and well-nourished.  HENT:  Head: Normocephalic and atraumatic.  Eyes: Conjunctivae normal are normal. Pupils are equal, round, and reactive to light.  Neck: Normal range of motion. Neck supple.  Cardiovascular: Normal rate and regular rhythm.   Murmur heard. Pulmonary/Chest: Effort normal and breath sounds normal.  Abdominal: Soft. Bowel sounds are normal.          Assessment & Plan:  Change the prilosec to zantac 300 at bedtime due to increased hoarseness Increased blood pressure medicine 200 mg.  Begin allopurinol for gout  Monitor basic metabolic panel uric acid today The TSH today

## 2012-10-10 ENCOUNTER — Other Ambulatory Visit: Payer: Self-pay | Admitting: Internal Medicine

## 2012-11-03 ENCOUNTER — Emergency Department (HOSPITAL_COMMUNITY): Payer: No Typology Code available for payment source

## 2012-11-03 ENCOUNTER — Encounter (HOSPITAL_COMMUNITY): Payer: Self-pay | Admitting: *Deleted

## 2012-11-03 ENCOUNTER — Emergency Department (HOSPITAL_COMMUNITY): Payer: No Typology Code available for payment source | Admitting: Certified Registered"

## 2012-11-03 ENCOUNTER — Emergency Department (HOSPITAL_COMMUNITY)
Admission: EM | Admit: 2012-11-03 | Discharge: 2012-11-03 | Disposition: A | Discharge status: Home / Self Care | Payer: No Typology Code available for payment source | Attending: Emergency Medicine | Admitting: Emergency Medicine

## 2012-11-03 ENCOUNTER — Encounter (HOSPITAL_COMMUNITY): Payer: Self-pay | Admitting: Certified Registered"

## 2012-11-03 ENCOUNTER — Encounter (HOSPITAL_COMMUNITY)
Admission: EM | Disposition: A | Discharge status: Home / Self Care | Payer: Self-pay | Source: Home / Self Care | Attending: Emergency Medicine

## 2012-11-03 DIAGNOSIS — S52309A Unspecified fracture of shaft of unspecified radius, initial encounter for closed fracture: Secondary | ICD-10-CM | POA: Insufficient documentation

## 2012-11-03 DIAGNOSIS — M19049 Primary osteoarthritis, unspecified hand: Secondary | ICD-10-CM | POA: Insufficient documentation

## 2012-11-03 DIAGNOSIS — F17200 Nicotine dependence, unspecified, uncomplicated: Secondary | ICD-10-CM | POA: Insufficient documentation

## 2012-11-03 DIAGNOSIS — Z79899 Other long term (current) drug therapy: Secondary | ICD-10-CM | POA: Insufficient documentation

## 2012-11-03 DIAGNOSIS — M25539 Pain in unspecified wrist: Secondary | ICD-10-CM | POA: Insufficient documentation

## 2012-11-03 DIAGNOSIS — I10 Essential (primary) hypertension: Secondary | ICD-10-CM | POA: Insufficient documentation

## 2012-11-03 DIAGNOSIS — M19039 Primary osteoarthritis, unspecified wrist: Secondary | ICD-10-CM | POA: Insufficient documentation

## 2012-11-03 DIAGNOSIS — S5290XA Unspecified fracture of unspecified forearm, initial encounter for closed fracture: Secondary | ICD-10-CM

## 2012-11-03 DIAGNOSIS — S5291XA Unspecified fracture of right forearm, initial encounter for closed fracture: Secondary | ICD-10-CM

## 2012-11-03 DIAGNOSIS — S52209A Unspecified fracture of shaft of unspecified ulna, initial encounter for closed fracture: Secondary | ICD-10-CM | POA: Insufficient documentation

## 2012-11-03 HISTORY — PX: ORIF ULNAR FRACTURE: SHX5417

## 2012-11-03 LAB — CBC WITH DIFFERENTIAL/PLATELET
Basophils Relative: 0 % (ref 0–1)
Eosinophils Absolute: 0.1 10*3/uL (ref 0.0–0.7)
Eosinophils Relative: 2 % (ref 0–5)
HCT: 35.1 % — ABNORMAL LOW (ref 39.0–52.0)
Hemoglobin: 11.9 g/dL — ABNORMAL LOW (ref 13.0–17.0)
Lymphs Abs: 1 10*3/uL (ref 0.7–4.0)
MCH: 33.4 pg (ref 26.0–34.0)
MCHC: 33.9 g/dL (ref 30.0–36.0)
MCV: 98.6 fL (ref 78.0–100.0)
Monocytes Absolute: 0.9 10*3/uL (ref 0.1–1.0)
Monocytes Relative: 15 % — ABNORMAL HIGH (ref 3–12)

## 2012-11-03 LAB — BASIC METABOLIC PANEL
BUN: 16 mg/dL (ref 6–23)
Creatinine, Ser: 1.01 mg/dL (ref 0.50–1.35)
GFR calc Af Amer: 75 mL/min — ABNORMAL LOW (ref >90)
GFR calc non Af Amer: 65 mL/min — ABNORMAL LOW (ref >90)
Glucose, Bld: 89 mg/dL (ref 70–99)

## 2012-11-03 SURGERY — OPEN REDUCTION INTERNAL FIXATION (ORIF) ULNAR FRACTURE
Anesthesia: General | Site: Arm Lower | Laterality: Right | Wound class: Clean

## 2012-11-03 MED ORDER — FENTANYL CITRATE 0.05 MG/ML IJ SOLN
INTRAMUSCULAR | Status: DC | PRN
  Administered 2012-11-03 (×2): 50 ug via INTRAVENOUS

## 2012-11-03 MED ORDER — CLINDAMYCIN PHOSPHATE 600 MG/50ML IV SOLN
600.0000 mg | INTRAVENOUS | Status: AC
  Administered 2012-11-03: 600 mg via INTRAVENOUS
  Filled 2012-11-03: qty 50

## 2012-11-03 MED ORDER — EPHEDRINE SULFATE 50 MG/ML IJ SOLN
INTRAMUSCULAR | Status: DC | PRN
  Administered 2012-11-03 (×2): 2.5 mg via INTRAVENOUS
  Administered 2012-11-03: 5 mg via INTRAVENOUS
  Administered 2012-11-03: 10 mg via INTRAVENOUS
  Administered 2012-11-03: 2.5 mg via INTRAVENOUS

## 2012-11-03 MED ORDER — HYDROCODONE-ACETAMINOPHEN 5-500 MG PO TABS
1.0000 | ORAL_TABLET | Freq: Four times a day (QID) | ORAL | Status: DC | PRN
Start: 2012-11-03 — End: 2012-12-26

## 2012-11-03 MED ORDER — 0.9 % SODIUM CHLORIDE (POUR BTL) OPTIME
TOPICAL | Status: DC | PRN
  Administered 2012-11-03: 1000 mL

## 2012-11-03 MED ORDER — MIDAZOLAM HCL 5 MG/5ML IJ SOLN
INTRAMUSCULAR | Status: DC | PRN
  Administered 2012-11-03: 1 mg via INTRAVENOUS

## 2012-11-03 MED ORDER — PROPOFOL 10 MG/ML IV BOLUS
INTRAVENOUS | Status: DC | PRN
  Administered 2012-11-03: 200 mg via INTRAVENOUS

## 2012-11-03 MED ORDER — LIDOCAINE-EPINEPHRINE (PF) 1.5 %-1:200000 IJ SOLN
INTRAMUSCULAR | Status: DC | PRN
  Administered 2012-11-03: 20 mL

## 2012-11-03 MED ORDER — MORPHINE SULFATE 4 MG/ML IJ SOLN
4.0000 mg | Freq: Once | INTRAMUSCULAR | Status: AC
  Administered 2012-11-03: 4 mg via INTRAVENOUS
  Filled 2012-11-03: qty 1

## 2012-11-03 MED ORDER — ONDANSETRON HCL 4 MG/2ML IJ SOLN
INTRAMUSCULAR | Status: DC | PRN
  Administered 2012-11-03: 4 mg via INTRAVENOUS

## 2012-11-03 MED ORDER — BUPIVACAINE-EPINEPHRINE PF 0.5-1:200000 % IJ SOLN
INTRAMUSCULAR | Status: DC | PRN
  Administered 2012-11-03: 30 mL

## 2012-11-03 MED ORDER — LACTATED RINGERS IV SOLN
INTRAVENOUS | Status: DC | PRN
  Administered 2012-11-03 (×2): via INTRAVENOUS

## 2012-11-03 SURGICAL SUPPLY — 70 items
APL SKNCLS STERI-STRIP NONHPOA (GAUZE/BANDAGES/DRESSINGS)
BANDAGE ELASTIC 3 VELCRO ST LF (GAUZE/BANDAGES/DRESSINGS) ×2 IMPLANT
BANDAGE ELASTIC 4 VELCRO ST LF (GAUZE/BANDAGES/DRESSINGS) ×2 IMPLANT
BANDAGE GAUZE ELAST BULKY 4 IN (GAUZE/BANDAGES/DRESSINGS) ×2 IMPLANT
BENZOIN TINCTURE PRP APPL 2/3 (GAUZE/BANDAGES/DRESSINGS) IMPLANT
BLADE SURG ROTATE 9660 (MISCELLANEOUS) IMPLANT
BNDG CMPR 9X4 STRL LF SNTH (GAUZE/BANDAGES/DRESSINGS) ×1
BNDG ESMARK 4X9 LF (GAUZE/BANDAGES/DRESSINGS) ×2 IMPLANT
CLOTH BEACON ORANGE TIMEOUT ST (SAFETY) ×2 IMPLANT
CLSR STERI-STRIP ANTIMIC 1/2X4 (GAUZE/BANDAGES/DRESSINGS) ×1 IMPLANT
CORDS BIPOLAR (ELECTRODE) ×2 IMPLANT
COVER SURGICAL LIGHT HANDLE (MISCELLANEOUS) ×2 IMPLANT
CUFF TOURNIQUET SINGLE 18IN (TOURNIQUET CUFF) ×2 IMPLANT
CUFF TOURNIQUET SINGLE 24IN (TOURNIQUET CUFF) IMPLANT
DRAPE INCISE IOBAN 66X45 STRL (DRAPES) IMPLANT
DRAPE OEC MINIVIEW 54X84 (DRAPES) ×1 IMPLANT
DRAPE U-SHAPE 47X51 STRL (DRAPES) ×2 IMPLANT
DRILL BIT 2.5 ×1 IMPLANT
DRSG ADAPTIC 3X8 NADH LF (GAUZE/BANDAGES/DRESSINGS) ×2 IMPLANT
DURAPREP 26ML APPLICATOR (WOUND CARE) ×2 IMPLANT
ELECT REM PT RETURN 9FT ADLT (ELECTROSURGICAL) ×2
ELECTRODE REM PT RTRN 9FT ADLT (ELECTROSURGICAL) IMPLANT
GLOVE BIO SURGEON STRL SZ8.5 (GLOVE) ×2 IMPLANT
GLOVE BIOGEL PI IND STRL 6.5 (GLOVE) IMPLANT
GLOVE BIOGEL PI IND STRL 8 (GLOVE) ×1 IMPLANT
GLOVE BIOGEL PI IND STRL 8.5 (GLOVE) ×1 IMPLANT
GLOVE BIOGEL PI INDICATOR 6.5 (GLOVE) ×2
GLOVE BIOGEL PI INDICATOR 8 (GLOVE) ×1
GLOVE BIOGEL PI INDICATOR 8.5 (GLOVE) ×1
GLOVE ORTHOPEDIC STR SZ6.5 (GLOVE) ×2 IMPLANT
GLOVE SS BIOGEL STRL SZ 8 (GLOVE) ×1 IMPLANT
GLOVE SUPERSENSE BIOGEL SZ 8 (GLOVE) ×1
GOWN PREVENTION PLUS XLARGE (GOWN DISPOSABLE) ×5 IMPLANT
GOWN PREVENTION PLUS XXLARGE (GOWN DISPOSABLE) ×1 IMPLANT
GOWN STRL NON-REIN LRG LVL3 (GOWN DISPOSABLE) ×1 IMPLANT
KIT BASIN OR (CUSTOM PROCEDURE TRAY) ×2 IMPLANT
KIT ROOM TURNOVER OR (KITS) ×2 IMPLANT
MANIFOLD NEPTUNE II (INSTRUMENTS) ×2 IMPLANT
NEEDLE 22X1 1/2 (OR ONLY) (NEEDLE) IMPLANT
NS IRRIG 1000ML POUR BTL (IV SOLUTION) ×2 IMPLANT
PACK ORTHO EXTREMITY (CUSTOM PROCEDURE TRAY) ×2 IMPLANT
PAD ARMBOARD 7.5X6 YLW CONV (MISCELLANEOUS) ×4 IMPLANT
PAD CAST 4YDX4 CTTN HI CHSV (CAST SUPPLIES) ×2 IMPLANT
PADDING CAST COTTON 4X4 STRL (CAST SUPPLIES) ×2
PLATE LOCK COMP 6H FOOT (Plate) ×2 IMPLANT
SCREW CORTICAL 3.5MM  10MM (Screw) ×1 IMPLANT
SCREW CORTICAL 3.5MM  16MM (Screw) ×4 IMPLANT
SCREW CORTICAL 3.5MM  20MM (Screw) ×1 IMPLANT
SCREW CORTICAL 3.5MM 10MM (Screw) IMPLANT
SCREW CORTICAL 3.5MM 14MM (Screw) ×1 IMPLANT
SCREW CORTICAL 3.5MM 16MM (Screw) IMPLANT
SCREW CORTICAL 3.5MM 18MM (Screw) ×4 IMPLANT
SCREW CORTICAL 3.5MM 20MM (Screw) IMPLANT
SCREW CORTICAL 3.5MM 24MM (Screw) ×1 IMPLANT
SPLINT FIBERGLASS 4X30 (CAST SUPPLIES) ×2 IMPLANT
SPONGE GAUZE 4X4 12PLY (GAUZE/BANDAGES/DRESSINGS) ×2 IMPLANT
SPONGE LAP 4X18 X RAY DECT (DISPOSABLE) ×2 IMPLANT
STAPLER VISISTAT 35W (STAPLE) IMPLANT
STRIP CLOSURE SKIN 1/2X4 (GAUZE/BANDAGES/DRESSINGS) IMPLANT
SUCTION FRAZIER TIP 10 FR DISP (SUCTIONS) ×2 IMPLANT
SUT ETHILON 4 0 PS 2 18 (SUTURE) ×2 IMPLANT
SUT VIC AB 0 CT1 27 (SUTURE) ×2
SUT VIC AB 0 CT1 27XBRD ANBCTR (SUTURE) ×1 IMPLANT
SUT VIC AB 2-0 CT1 27 (SUTURE) ×2
SUT VIC AB 2-0 CT1 TAPERPNT 27 (SUTURE) ×1 IMPLANT
SYR CONTROL 10ML LL (SYRINGE) IMPLANT
TOWEL OR 17X24 6PK STRL BLUE (TOWEL DISPOSABLE) ×2 IMPLANT
TOWEL OR 17X26 10 PK STRL BLUE (TOWEL DISPOSABLE) ×2 IMPLANT
TUBE CONNECTING 12X1/4 (SUCTIONS) ×2 IMPLANT
WATER STERILE IRR 1000ML POUR (IV SOLUTION) ×2 IMPLANT

## 2012-11-03 NOTE — ED Notes (Signed)
Pt placed on monitor, continuous pulse oximetry and blood pressure cuff; EKG performed; family at bedside

## 2012-11-03 NOTE — ED Notes (Signed)
Telephone report given to [place], [employer] fundraiser in [place] holding

## 2012-11-03 NOTE — Anesthesia Procedure Notes (Signed)
Anesthesia Regional Block:  Axillary brachial plexus block  Pre-Anesthetic Checklist: ,, timeout performed, Correct Patient, Correct Site, Correct Laterality, Correct Procedure, Correct Position, site marked, Risks and benefits discussed,  Surgical consent,  Pre-op evaluation,  At surgeon's request and post-op pain management  Laterality: Right  Prep: chloraprep       Needles:  Injection technique: Single-shot  Needle Type: Other   (short "B" bevel needls)    Needle Gauge: 22 and 22 G    Additional Needles:  Procedures: paresthesia technique Axillary brachial plexus block  Nerve Stimulator or Paresthesia:  Response: transient median nerve paresthesia ,  Response: transient ulnar nerve paresthesia,   Additional Responses:   Narrative:  Start time: 11/03/2012 3:16 PM End time: 11/03/2012 3:24 PM Injection made incrementally with aspirations every 5 mL.  Events: blood aspirated  Performed by: Personally  Anesthesiologist: Sandford Craze, MD  Additional Notes: Pt identified in Holding room.  Monitors applied. Working IV access confirmed. Sterile prep R axilla.  #22ga "B" bevel to transient median, then ulnar paresthesiae.  Total 30cc 0.5% Bupivacaine with 1:200k epi and 20cc 1.5% Lidocaine with 1:200k epi distributed around each paresthesia, injected incrementally after negative test doses.  Patient asymptomatic, VSS, needle cleared and repositioned after heme aspirated, tolerated well.   Sandford Craze, MD

## 2012-11-03 NOTE — Anesthesia Postprocedure Evaluation (Signed)
  Anesthesia Post-op Note  Patient: Albert Allen  Procedure(s) Performed: Procedure(s) (LRB) with comments: OPEN REDUCTION INTERNAL FIXATION (ORIF) ULNAR FRACTURE (Right) - ORIF radius and ulnar fractures  Patient Location: PACU  Anesthesia Type:General  Level of Consciousness: awake, alert  and oriented  Airway and Oxygen Therapy: Patient Spontanous Breathing  Post-op Pain: none  Post-op Assessment: Post-op Vital signs reviewed, Patient's Cardiovascular Status Stable, Respiratory Function Stable and Pain level controlled  Post-op Vital Signs: stable  Complications: No apparent anesthesia complications
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
## 2012-11-03 NOTE — Anesthesia Preprocedure Evaluation (Signed)
Anesthesia Evaluation  Patient identified by MRN, date of birth, ID band Patient awake    Reviewed: Allergy & Precautions, H&P , NPO status , Patient's Chart, lab work & pertinent test results, reviewed documented beta blocker date and time   History of Anesthesia Complications Negative for: history of anesthetic complications  Airway Mallampati: II TM Distance: >3 FB Neck ROM: Full    Dental  (+) Teeth Intact, Caps and Dental Advisory Given   Pulmonary asthma , Current Smoker,  breath sounds clear to auscultation  Pulmonary exam normal       Cardiovascular hypertension, Pt. on medications + CAD and + Peripheral Vascular Disease - dysrhythmias Rhythm:Regular Rate:Normal  '10 cath: 80% LAD, moderate disease of other vessels, normal LVF   LBBB   Neuro/Psych Anxiety negative neurological ROS     GI/Hepatic Neg liver ROS, hiatal hernia, GERD-  Medicated and Controlled,(+)     substance abuse  alcohol use,   Endo/Other  Hypothyroidism   Renal/GU negative Renal ROS     Musculoskeletal   Abdominal   Peds  Hematology   Anesthesia Other Findings   Reproductive/Obstetrics                           Anesthesia Physical Anesthesia Plan  ASA: III  Anesthesia Plan: General   Post-op Pain Management:    Induction: Intravenous  Airway Management Planned: LMA  Additional Equipment:   Intra-op Plan:   Post-operative Plan:   Informed Consent: I have reviewed the patients History and Physical, chart, labs and discussed the procedure including the risks, benefits and alternatives for the proposed anesthesia with the patient or authorized representative who has indicated his/her understanding and acceptance.   Dental advisory given and Consent reviewed with POA  Plan Discussed with: CRNA and Surgeon  Anesthesia Plan Comments: (Plan routine monitors, GA- LMA OK, axillary block for post op  analgesia)        Anesthesia Quick Evaluation

## 2012-11-03 NOTE — ED Notes (Signed)
Patient transported to X-ray 

## 2012-11-03 NOTE — ED Notes (Signed)
Pt NPO since 0830

## 2012-11-03 NOTE — ED Notes (Addendum)
Pt totally undressed; clothes, shoes and coat placed in personal belongings bag and labeled with pt's sticker

## 2012-11-03 NOTE — Transfer of Care (Signed)
Immediate Anesthesia Transfer of Care Note  Patient: Albert Allen  Procedure(s) Performed: Procedure(s) (LRB) with comments: OPEN REDUCTION INTERNAL FIXATION (ORIF) ULNAR FRACTURE (Right) - ORIF radius and ulnar fractures  Patient Location: PACU  Anesthesia Type:General and Regional  Level of Consciousness: awake, oriented, patient cooperative and responds to stimulation  Airway & Oxygen Therapy: Patient Spontanous Breathing and Patient connected to face mask oxygen  Post-op Assessment: Report given to PACU RN and Post -op Vital signs reviewed and stable  Post vital signs: Reviewed and stable  Complications: No apparent anesthesia complications

## 2012-11-03 NOTE — Interval H&P Note (Signed)
History and Physical Interval Note:  11/03/2012 3:01 PM  Albert Allen  has presented today for surgery, with the diagnosis of both bone forearm fracture, right  The various methods of treatment have been discussed with the patient and family. After consideration of risks, benefits and other options for treatment, the patient has consented to  Procedure(s) (LRB) with comments: OPEN REDUCTION INTERNAL FIXATION (ORIF) ULNAR FRACTURE (Right) as a surgical intervention .  The patient's history has been reviewed, patient examined, no change in status, stable for surgery.  I have reviewed the patient's chart and labs.  Questions were answered to the patient's satisfaction.     Joleah Kosak G
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
## 2012-11-03 NOTE — ED Provider Notes (Signed)
I saw and evaluated the patient, reviewed the resident's note and I agree with the findings and plan.  Derwood Kaplan, MD 11/03/12 [phone]

## 2012-11-03 NOTE — Progress Notes (Signed)
Orthopedic Tech Progress Note Patient Details:  Albert Allen December 24, 1922 [id number]  Ortho Devices Type of Ortho Device: Arm sling Ortho Device/Splint Location: (R) UE Ortho Device/Splint Interventions: Ordered;Application   Jennye Moccasin 11/03/2012, 8:27 PM

## 2012-11-03 NOTE — Progress Notes (Signed)
DR Katrinka Blazing NOTIFIED OF B/P'S AND NO NEW ORDERS NOTED AND OK TO D/C HOME PER DR Katrinka Blazing

## 2012-11-03 NOTE — Op Note (Signed)
#  [id number] 
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
## 2012-11-08 ENCOUNTER — Encounter (HOSPITAL_COMMUNITY): Payer: Self-pay | Admitting: Orthopaedic Surgery

## 2012-12-14 ENCOUNTER — Other Ambulatory Visit: Payer: Self-pay | Admitting: Internal Medicine

## 2012-12-19 ENCOUNTER — Other Ambulatory Visit: Payer: Self-pay | Admitting: Internal Medicine

## 2012-12-26 ENCOUNTER — Encounter: Payer: Self-pay | Admitting: Internal Medicine

## 2012-12-26 ENCOUNTER — Ambulatory Visit (INDEPENDENT_AMBULATORY_CARE_PROVIDER_SITE_OTHER): Payer: Medicare Other | Admitting: Internal Medicine

## 2012-12-26 VITALS — BP 140/80 | HR 72 | Temp 98.2°F | Resp 16 | Ht 68.0 in | Wt 141.0 lb

## 2012-12-26 DIAGNOSIS — K219 Gastro-esophageal reflux disease without esophagitis: Secondary | ICD-10-CM

## 2012-12-26 DIAGNOSIS — I10 Essential (primary) hypertension: Secondary | ICD-10-CM

## 2012-12-26 DIAGNOSIS — E785 Hyperlipidemia, unspecified: Secondary | ICD-10-CM

## 2012-12-26 MED ORDER — RANITIDINE HCL 300 MG PO TABS: 300.0000 mg | ORAL_TABLET | Freq: Every day | ORAL | Status: DC
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
## 2012-12-26 NOTE — Patient Instructions (Signed)
The patient is instructed to continue all medications as prescribed. Schedule followup with check out clerk upon leaving the clinic  

## 2013-03-16 ENCOUNTER — Telehealth: Payer: Self-pay | Admitting: Cardiovascular Disease

## 2013-03-16 NOTE — Telephone Encounter (Signed)
Pt returns call. After much discussion pt  Has been having some heavy pressure in the center of his chest. He did have pain this morning but it was relieved by drinking a glass of water. He thinks this may be more of his acid reflux & hiatal hernia. States he did have a hotdog last pm with chili,slaw, & onions. Also eats sausage.  Reviewed diet to help his acid reflux. Denies any cardiac issues at this time. He also states he has been taking his Crestor all times of the day & night. Encouraged pt to take Crestor at bedtime.  Pt requests 6 month appointment with Dr. Clifton James. Appt made & reminder mailed out today. Mylo Red RN

## 2013-03-16 NOTE — Telephone Encounter (Signed)
New Problem:    Patient called in because he is experiencing some chest pressure.  When asked patient stated that he has a hernia, but otherwise he is just experincing chest pressure.  Please call back.

## 2013-03-16 NOTE — Telephone Encounter (Signed)
I spoke with pt wife,Elizabeth, " he's doing fine he just wanted to see Dr. Clifton James".   Offered appt with PA same day as Dr. Clifton James is here.  Wife will have pt call back as he has just left in his truck.  Mylo Red RN

## 2013-03-27 ENCOUNTER — Other Ambulatory Visit: Payer: Self-pay | Admitting: *Deleted

## 2013-03-27 MED ORDER — ALPRAZOLAM 0.25 MG PO TABS: 0.2500 mg | ORAL_TABLET | Freq: Four times a day (QID) | ORAL | Status: DC | PRN

## 2013-04-13 ENCOUNTER — Ambulatory Visit (INDEPENDENT_AMBULATORY_CARE_PROVIDER_SITE_OTHER): Payer: Medicare Other | Admitting: Cardiovascular Disease

## 2013-04-13 ENCOUNTER — Encounter: Payer: Self-pay | Admitting: Cardiovascular Disease

## 2013-04-13 VITALS — BP 167/68 | HR 68 | Ht 68.0 in | Wt 140.0 lb

## 2013-04-13 DIAGNOSIS — I2510 Atherosclerotic heart disease of native coronary artery without angina pectoris: Secondary | ICD-10-CM

## 2013-04-13 DIAGNOSIS — R55 Syncope and collapse: Secondary | ICD-10-CM

## 2013-04-13 NOTE — Patient Instructions (Addendum)
Your physician recommends that you schedule a follow-up appointment in:  4-5 weeks.   Your physician has requested that you have an echocardiogram. Echocardiography is a painless test that uses sound waves to create images of your heart. It provides your doctor with information about the size and shape of your heart and how well your heart's chambers and valves are working. This procedure takes approximately one hour. There are no restrictions for this procedure.    
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
## 2013-04-17 ENCOUNTER — Encounter: Payer: Self-pay | Admitting: Family Medicine

## 2013-04-17 ENCOUNTER — Telehealth: Payer: Self-pay | Admitting: Family Medicine

## 2013-04-17 ENCOUNTER — Ambulatory Visit (INDEPENDENT_AMBULATORY_CARE_PROVIDER_SITE_OTHER): Payer: Medicare Other | Admitting: Family Medicine

## 2013-04-17 VITALS — BP 164/69 | HR 66 | Temp 97.0°F | Ht 67.5 in | Wt 139.0 lb

## 2013-04-17 DIAGNOSIS — N400 Enlarged prostate without lower urinary tract symptoms: Secondary | ICD-10-CM

## 2013-04-17 DIAGNOSIS — R300 Dysuria: Secondary | ICD-10-CM

## 2013-04-17 DIAGNOSIS — J309 Allergic rhinitis, unspecified: Secondary | ICD-10-CM

## 2013-04-17 DIAGNOSIS — J302 Other seasonal allergic rhinitis: Secondary | ICD-10-CM

## 2013-04-17 LAB — POCT UA - MICROSCOPIC ONLY
Casts, Ur, LPF, POC: NEGATIVE
Crystals, Ur, HPF, POC: NEGATIVE
Yeast, UA: NEGATIVE

## 2013-04-17 LAB — POCT URINALYSIS DIPSTICK
Bilirubin, UA: NEGATIVE
Glucose, UA: NEGATIVE
Ketones, UA: NEGATIVE
Leukocytes, UA: NEGATIVE
Nitrite, UA: NEGATIVE
Spec Grav, UA: >=1.03
Urobilinogen, UA: NEGATIVE
pH, UA: 5

## 2013-04-17 MED ORDER — CIPROFLOXACIN HCL 500 MG PO TABS: 500.0000 mg | ORAL_TABLET | Freq: Two times a day (BID) | ORAL | Status: DC

## 2013-04-17 MED ORDER — FLUTICASONE PROPIONATE 50 MCG/ACT NA SUSP: 2.0000 | Freq: Every day | NASAL | Status: DC

## 2013-04-17 MED ORDER — TAMSULOSIN HCL 0.4 MG PO CAPS: 0.4000 mg | ORAL_CAPSULE | Freq: Every day | ORAL | Status: DC

## 2013-04-17 NOTE — Progress Notes (Signed)
Patient ID: Albert Allen, male   DOB: 12-Apr-1923, 79 y.o.   MRN: [id number] SUBJECTIVE: HPI: Benign Prostatic Hypertrophy Patient complains of lower urinary tract symptoms. He reports frequency, straining, urgency and weak stream. He denies incomplete emptying. Patient states symptoms are of moderate severity. Onset of symptoms was 7 days ago and was gradual in onset. He has no personal history of prostate cancer. He reports a history of no complicating symptoms he has had prostatitis.Marland Kitchen He denies no complicating symptoms.  Past Medical History  Diagnosis Date  . HYPOTHYROIDISM 05/31/2007  . HYPERLIPIDEMIA 03/27/2009  . GOUT 05/31/2007  . ANEMIA, B12 DEFICIENCY 03/29/2008  . Anxiety state, unspecified 11/19/2008  . ABUSE, ALCOHOL, IN REMISSION 05/31/2007  . CARPAL TUNNEL SYNDROME, RIGHT 09/05/2007  . 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 HYPERTENSION 05/31/2007  . CAD, NATIVE VESSEL 04/04/2009  . LEFT BUNDLE BRANCH BLOCK 02/11/2009  . SINUSITIS, CHRONIC NOS 05/31/2007  . ASTHMA 05/31/2007  . GERD 05/31/2007  . Chronic prostatitis 03/29/2008  . LOC OSTEOARTHROS NOT SPEC PRIM/SEC LOWER LEG 03/20/2010  . OSTEOARTHRITIS 05/31/2007  . HIP PAIN 03/20/2010  . DEGENERATIVE DISC DISEASE, CERVICAL SPINE 06/22/2007  . Adhesive capsulitis of shoulder 12/29/2007  . SYNCOPE 11/19/2008  . WEIGHT LOSS, RECENT 11/19/2008  . DYSPHAGIA UNSPECIFIED 09/25/2008  . Hepatomegaly 09/25/2008  . PSA, INCREASED 03/20/2010  . ABNORMAL CV (STRESS) TEST 03/05/2009  . PERSONAL HX COLONIC POLYPS 03/05/1997    adenomatous   . Liver mass, left lobe   . Hiatal hernia   . Esophageal stricture    Past Surgical History  Procedure Laterality Date  . Pul. colapse w/chest tube    . Tonsillectomy    . Carpel tunnel release    . Orif ulnar fracture  11/03/2012    Procedure: OPEN REDUCTION INTERNAL FIXATION (ORIF) ULNAR FRACTURE;  Surgeon: Velna Ochs, MD;  Location: [facility] OR;  Service: Orthopedics;  Laterality: Right;  ORIF radius and ulnar  fractures   History   Social History  . Marital Status: Married    Spouse Name: N/A    Number of Children: N/A  . Years of Education: N/A   Occupational History  . retired    Social History Main Topics  . Smoking status: Former Smoker    Types: Cigarettes    Quit date: 12/27/2005  . Smokeless tobacco: Not on file  . Alcohol Use: No  . Drug Use: No  . Sexually Active: Not Currently   Other Topics Concern  . Not on file   Social History Narrative  . No narrative on file   Family History  Problem Relation Age of Onset  . Cancer Mother     In back but unsure of what type of cancer  . COPD Father   . Colon cancer Neg Hx    Current Outpatient Prescriptions on File Prior to Visit  Medication Sig Dispense Refill  . albuterol (PROAIR HFA) 108 (90 BASE) MCG/ACT inhaler Inhale 2 puffs into the lungs every 6 (six) hours as needed.  6.7 g  11  . ALPRAZolam (XANAX) 0.25 MG tablet Take 1 tablet (0.25 mg total) by mouth every 6 (six) hours as needed for sleep.  60 tablet  3  . aspirin EC 81 MG tablet Take 81 mg by mouth daily.      . Cholecalciferol (VITAMIN D PO) Take 1 tablet by mouth as needed.       . diclofenac (VOLTAREN) 75 MG EC tablet Take 75 mg by mouth every evening.      Marland Kitchen  levothyroxine (SYNTHROID, LEVOTHROID) 75 MCG tablet Take 75 mcg by mouth daily.        Marland Kitchen losartan (COZAAR) 100 MG tablet Take 1 tablet (100 mg total) by mouth daily.  30 tablet  11  . metoprolol tartrate (LOPRESSOR) 25 MG tablet Take 1/2 tab twice a day      . montelukast (SINGULAIR) 10 MG tablet TAKE ONE TABLET BY MOUTH AT BEDTIME  30 tablet  10  . omeprazole (PRILOSEC) 20 MG capsule Take 20 mg by mouth daily.      . rosuvastatin (CRESTOR) 10 MG tablet Take 10 mg by mouth daily.         Current Facility-Administered Medications on File Prior to Visit  Medication Dose Route Frequency Provider Last Rate Last Dose  . methylPREDNISolone acetate (DEPO-MEDROL) injection 40 mg  40 mg Intra-articular Once  Stacie Glaze, MD       Allergies  Allergen Reactions  . Amoxicillin     REACTION: unspecified  . Penicillins Swelling    LIPS   Immunization History  Administered Date(s) Administered  . Influenza Split 09/24/2011, 09/22/2012  . Influenza Whole 09/05/2007, 08/21/2008, 09/25/2009, 09/25/2010  . Pneumococcal Polysaccharide 02/11/2009  . Td 11/30/2005   Prior to Admission medications   Medication Sig Start Date End Date Taking? Authorizing Provider  albuterol (PROAIR HFA) 108 (90 BASE) MCG/ACT inhaler Inhale 2 puffs into the lungs every 6 (six) hours as needed. 01/22/12   Stacie Glaze, MD  ALPRAZolam Prudy Feeler) 0.25 MG tablet Take 1 tablet (0.25 mg total) by mouth every 6 (six) hours as needed for sleep. 03/27/13   Stacie Glaze, MD  aspirin EC 81 MG tablet Take 81 mg by mouth daily.    Historical Provider, MD  Cholecalciferol (VITAMIN D PO) Take 1 tablet by mouth as needed.     Historical Provider, MD  diclofenac (VOLTAREN) 75 MG EC tablet Take 75 mg by mouth every evening.    Historical Provider, MD  levothyroxine (SYNTHROID, LEVOTHROID) 75 MCG tablet Take 75 mcg by mouth daily.      Historical Provider, MD  losartan (COZAAR) 100 MG tablet Take 1 tablet (100 mg total) by mouth daily. 09/22/12 09/22/13  Stacie Glaze, MD  metoprolol tartrate (LOPRESSOR) 25 MG tablet Take 1/2 tab twice a day 12/07/11   Stacie Glaze, MD  montelukast (SINGULAIR) 10 MG tablet TAKE ONE TABLET BY MOUTH AT BEDTIME 12/14/12   Stacie Glaze, MD  omeprazole (PRILOSEC) 20 MG capsule Take 20 mg by mouth daily.    Historical Provider, MD  rosuvastatin (CRESTOR) 10 MG tablet Take 10 mg by mouth daily.      Historical Provider, MD    ROS: As above in the HPI. All other systems are stable or negative.  OBJECTIVE: APPEARANCE:  Patient in no acute distress.The patient appeared well nourished and normally developed. Acyanotic. Waist: VITAL SIGNS:BP 164/69  Pulse 66  Temp(Src) 97 F (36.1 C) (Oral)  Ht 5' 7.5"  (1.715 m)  Wt 139 lb (63.05 kg)  BMI 21.44 kg/m2   SKIN: warm and  Dry without overt rashes, tattoos and scars  HEAD and Neck: without JVD, Head and scalp: normal Eyes:No scleral icterus. Fundi normal, eye movements normal. Ears: Auricle normal, canal normal, Tympanic membranes normal, insufflation normal. Nose:rhinitis and rhinorrhea. Throat: normal Neck & thyroid: normal  CHEST & LUNGS: Chest wall: normal Lungs: Clear  CVS: Reveals the PMI to be normally located. Regular rhythm, First and Second Heart sounds are  normal,  absence of murmurs, rubs or gallops. Peripheral vasculature: Radial pulses: normal Dorsal pedis pulses: normal Posterior pulses: normal  ABDOMEN:  Appearance: normal Benign,, no organomegaly, no masses, no Abdominal Aortic enlargement. No Guarding , no rebound. No Bruits. Bowel sounds: normal  RECTAL:prostate enlarged moderately enlarged GU: N/A  EXTREMETIES: nonedematous.  MUSCULOSKELETAL:  Spine: normal Joints: intact  NEUROLOGIC: oriented to time,place and person; nonfocal.  ASSESSMENT: Dysuria - Plan: POCT UA - Microscopic Only, POCT urinalysis dipstick, Urine culture, ciprofloxacin (CIPRO) 500 MG tablet  BPH (benign prostatic hyperplasia) - Plan: Urine culture, ciprofloxacin (CIPRO) 500 MG tablet, tamsulosin (FLOMAX) 0.4 MG CAPS  Seasonal allergic rhinitis    PLAN: Orders Placed This Encounter  Procedures  . Urine culture  . POCT UA - Microscopic Only  . POCT urinalysis dipstick   Results for orders placed in visit on 04/17/13  POCT UA - MICROSCOPIC ONLY      Result Value Range   WBC, Ur, HPF, POC 1-3     RBC, urine, microscopic 1-3     Bacteria, U Microscopic occ     Mucus, UA moderate     Epithelial cells, urine per micros occ     Crystals, Ur, HPF, POC negative     Casts, Ur, LPF, POC negative     Yeast, UA negative    POCT URINALYSIS DIPSTICK      Result Value Range   Color, UA amber     Clarity, UA clear      Glucose, UA negative     Bilirubin, UA negative     Ketones, UA negative     Spec Grav, UA >=1.030     Blood, UA trace     pH, UA 5.0     Protein, UA 4+     Urobilinogen, UA negative     Nitrite, UA negative     Leukocytes, UA Negative     Meds ordered this encounter  Medications  . ciprofloxacin (CIPRO) 500 MG tablet    Sig: Take 1 tablet (500 mg total) by mouth 2 (two) times daily.    Dispense:  20 tablet    Refill:  0  . tamsulosin (FLOMAX) 0.4 MG CAPS    Sig: Take 1 capsule (0.4 mg total) by mouth daily after supper.    Dispense:  30 capsule    Refill:  3  . fluticasone (FLONASE) 50 MCG/ACT nasal spray    Sig: Place 2 sprays into the nose daily.    Dispense:  16 g    Refill:  5   RTC 2 weeks.  Juwaun Inskeep P. Modesto Charon, M.D.

## 2013-04-17 NOTE — Patient Instructions (Signed)
Benign Prostatic Hypertrophy  The prostate gland is part of the reproductive system of men. A normal prostate is about the size and shape of a walnut. The prostate gland makes a fluid that is mixed with sperm to make semen. This gland surrounds the urethra and is located in front of the rectum and just below the bladder. The bladder is where urine is stored. The urethra is the tube through which urine passes from the bladder to get out of the body. The prostate grows as a man ages. An enlarged prostate not caused by cancer is called benign prostatic hypertrophy (BPH). This is a common health problem in men over age 50. This condition is a normal part of aging. An enlarged prostate presses on the urethra. This makes it harder to pass urine. In the early stages of enlargement, the bladder can get by with a narrowed urethra by forcing the urine through. If the problem gets worse, medical or surgical treatment may be required.  This condition should be followed by your caregiver. Longstanding back pressure on the kidneys can cause infection. Back pressure and infection can progress to bladder damage and kidney (renal) failure. If needed, your caregiver may refer you to a specialist in kidney and prostate disease (urologist). CAUSES  The exact cause is not known.  SYMPTOMS   You are not able to completely empty your bladder.  Getting up often during the night to urinate.  Need to urinate frequently during the day.  Difficultly in starting urine flow.  Decrease in size and strength of the urine stream.  Dribbling after urination.  Pain on urination (more common with infection).  Inability to pass your water. This needs immediate treatment. DIAGNOSIS  These tests will help your caregiver understand your problem:  Digital rectal exam (DRE). In a rectal exam, your caregiver checks your prostate by putting a gloved, lubricated finger into the rectum to feel the back of your prostate gland. This exam  detects the size of the gland and abnormal lumps or growths.  Urinalysis (exam of the urine). This may include a culture if there is concern about infection.  Prostate Specific Antigen (PSA). This is a blood test used to screen for prostate cancer. It is not used alone for diagnosing prostate cancer.  Rectal ultrasound (sonogram). This test uses sound waves to electronically produce a "picture" of the prostate. It helps examine the prostate gland for cancer. TREATMENT  Mild symptoms may not need treatment. Simple observation and yearly exams may be all that is required. Medications and surgery are options for more severe problems. Your caregiver can help you make an informed decision for what is best. Two classes of medications are available for relief of prostate symptoms:  Medications that shrink the prostate. This helps relieve symptoms.  Uncommon side effects include problems with sexual function.  Medications to relax the muscle of the prostate. This also relieves the obstruction.  Side effects can include dizziness, fatigue, lightheadedness, and retrograde ejaculation (diminished volume of ejaculate). Several types of surgical treatments are available for relief of prostate symptoms:  Transurethral resection of the prostate (TURP). In this treatment, an instrument is inserted through opening at the tip of the penis. It is used to cut away pieces of the inner core of the prostate. The pieces are removed through the same opening of the penis. This removes the obstruction and helps get rid of the symptoms.  Transurethral incision (TUIP). In this procedure, small cuts are made in the prostate. This   lessens the prostates pressure on the urethra.  Transurethral microwave thermotherapy (TUMT). This procedure uses microwaves to create heat. The heat destroys and removes a small amount of prostate tissue.  Transurethral needle ablation (TUNA). This is a procedure that uses radio frequencies to  do the same as TUMT.  Interstitial laser coagulation (ILC). This is a procedure that uses a laser to do the same as TUMT and TUNA.  Transurethral electrovaporization (TUVP). This is a procedure that uses electrodes to do the same as the procedures listed above. Regardless of the method of treatment chosen, you and your caregiver will discuss the options. With this knowledge, you along with your caregiver can decide upon the best treatment for you. SEEK MEDICAL CARE IF:   You develop chills, fever of 100.5 F (38.1 C), or night sweats.  There is unexplained back pain.  Symptoms are not helped by medications prescribed.  You develop medication side effects.  Your urine becomes very dark or has a bad smell. SEEK IMMEDIATE MEDICAL CARE IF:   You are suddenly unable to urinate. This is an emergency. You should be seen immediately.  There are large amounts of blood or clots in the urine.  Your urinary problems become unmanageable.  You develop lightheadedness, severe dizziness, or you feel faint.  You develop moderate to severe low back or flank pain.  You develop chills or fever. Document Released: 11/16/2005 Document Revised: 02/08/2012 Document Reviewed: 08/08/2007 [facility] Patient Information 2013 [place], [place].

## 2013-04-17 NOTE — Telephone Encounter (Signed)
APPT MADE

## 2013-04-18 LAB — URINE CULTURE
Colony Count: NO GROWTH
Organism ID, Bacteria: NO GROWTH

## 2013-04-19 ENCOUNTER — Other Ambulatory Visit (INDEPENDENT_AMBULATORY_CARE_PROVIDER_SITE_OTHER): Payer: Medicare Other

## 2013-04-19 DIAGNOSIS — N390 Urinary tract infection, site not specified: Secondary | ICD-10-CM

## 2013-04-19 LAB — POCT UA - MICROSCOPIC ONLY
Casts, Ur, LPF, POC: NEGATIVE
Crystals, Ur, HPF, POC: NEGATIVE
WBC, Ur, HPF, POC: NEGATIVE

## 2013-04-19 LAB — POCT URINALYSIS DIPSTICK
Bilirubin, UA: NEGATIVE
Glucose, UA: NEGATIVE
Ketones, UA: NEGATIVE
Leukocytes, UA: NEGATIVE
Nitrite, UA: NEGATIVE
Protein, UA: NEGATIVE
Spec Grav, UA: 1.02
Urobilinogen, UA: NEGATIVE
pH, UA: 5

## 2013-04-19 NOTE — Progress Notes (Signed)
Quick Note:  Call patient. Urine culture negative Not unusual in prostate problems to have A negative culture. No change in plan. Take the antibiotics and keep the appointment for follow up.  ______

## 2013-04-20 ENCOUNTER — Telehealth: Payer: Self-pay | Admitting: Internal Medicine

## 2013-04-20 NOTE — Telephone Encounter (Signed)
Pt has had several episodes of blacking out, dizziness in the last few weeks. (last one 2 weeks ago) pt states kind of "woozy headed" every day. Pt has been to Dr Modesto Charon in [place] and MD put him 2 new meds,   Pt would like to come in for his CPX earlier than his 7/28 appt.. Pt said he would at least like blood work done to see if there's anything else going. Pls advise.

## 2013-04-21 ENCOUNTER — Other Ambulatory Visit: Payer: Self-pay | Admitting: *Deleted

## 2013-04-21 MED ORDER — MECLIZINE HCL 25 MG PO TABS: 25.0000 mg | ORAL_TABLET | Freq: Three times a day (TID) | ORAL | Status: DC | PRN

## 2013-04-21 NOTE — Telephone Encounter (Signed)
Per dr Lovell Sheehan- dr Modesto Charon and dr Sanjuana Kava are doing everything- dr Lovell Sheehan said we can call in some antivert which we did. Left message on machine

## 2013-04-25 ENCOUNTER — Ambulatory Visit (HOSPITAL_COMMUNITY): Payer: Medicare Other | Attending: Cardiology

## 2013-04-25 DIAGNOSIS — J4489 Other specified chronic obstructive pulmonary disease: Secondary | ICD-10-CM | POA: Insufficient documentation

## 2013-04-25 DIAGNOSIS — I2510 Atherosclerotic heart disease of native coronary artery without angina pectoris: Secondary | ICD-10-CM

## 2013-04-25 DIAGNOSIS — R079 Chest pain, unspecified: Secondary | ICD-10-CM | POA: Insufficient documentation

## 2013-04-25 DIAGNOSIS — I10 Essential (primary) hypertension: Secondary | ICD-10-CM | POA: Insufficient documentation

## 2013-04-25 DIAGNOSIS — I379 Nonrheumatic pulmonary valve disorder, unspecified: Secondary | ICD-10-CM | POA: Insufficient documentation

## 2013-04-25 DIAGNOSIS — Z87891 Personal history of nicotine dependence: Secondary | ICD-10-CM | POA: Insufficient documentation

## 2013-04-25 DIAGNOSIS — J449 Chronic obstructive pulmonary disease, unspecified: Secondary | ICD-10-CM | POA: Insufficient documentation

## 2013-04-25 DIAGNOSIS — I079 Rheumatic tricuspid valve disease, unspecified: Secondary | ICD-10-CM | POA: Insufficient documentation

## 2013-04-25 DIAGNOSIS — I447 Left bundle-branch block, unspecified: Secondary | ICD-10-CM | POA: Insufficient documentation

## 2013-04-25 DIAGNOSIS — I080 Rheumatic disorders of both mitral and aortic valves: Secondary | ICD-10-CM | POA: Insufficient documentation

## 2013-04-25 DIAGNOSIS — R55 Syncope and collapse: Secondary | ICD-10-CM | POA: Insufficient documentation

## 2013-04-25 NOTE — Progress Notes (Signed)
Echocardiogram performed.  

## 2013-05-01 ENCOUNTER — Encounter: Payer: Self-pay | Admitting: Family Medicine

## 2013-05-01 ENCOUNTER — Ambulatory Visit (INDEPENDENT_AMBULATORY_CARE_PROVIDER_SITE_OTHER): Payer: Medicare Other | Admitting: Family Medicine

## 2013-05-01 VITALS — BP 163/70 | HR 66 | Temp 97.2°F | Wt 137.0 lb

## 2013-05-01 DIAGNOSIS — E785 Hyperlipidemia, unspecified: Secondary | ICD-10-CM

## 2013-05-01 DIAGNOSIS — R39198 Other difficulties with micturition: Secondary | ICD-10-CM | POA: Insufficient documentation

## 2013-05-01 DIAGNOSIS — E039 Hypothyroidism, unspecified: Secondary | ICD-10-CM

## 2013-05-01 DIAGNOSIS — I10 Essential (primary) hypertension: Secondary | ICD-10-CM

## 2013-05-01 DIAGNOSIS — N411 Chronic prostatitis: Secondary | ICD-10-CM

## 2013-05-01 DIAGNOSIS — R3989 Other symptoms and signs involving the genitourinary system: Secondary | ICD-10-CM

## 2013-05-01 DIAGNOSIS — I2510 Atherosclerotic heart disease of native coronary artery without angina pectoris: Secondary | ICD-10-CM

## 2013-05-01 DIAGNOSIS — R634 Abnormal weight loss: Secondary | ICD-10-CM

## 2013-05-01 DIAGNOSIS — M109 Gout, unspecified: Secondary | ICD-10-CM

## 2013-05-01 LAB — POCT CBC
Granulocyte percent: 53.1 %G (ref 37–80)
HCT, POC: 36.2 % — AB (ref 43.5–53.7)
Hemoglobin: 12.5 g/dL — AB (ref 14.1–18.1)
Lymph, poc: 1.3 (ref 0.6–3.4)
MCH, POC: 33.7 pg — AB (ref 27–31.2)
MCHC: 34.5 g/dL (ref 31.8–35.4)
MCV: 97.5 fL — AB (ref 80–97)
MPV: 7.5 fL (ref 0–99.8)
POC Granulocyte: 1.6 — AB (ref 2–6.9)
POC LYMPH PERCENT: 42 %L (ref 10–50)
Platelet Count, POC: 191 10*3/uL (ref 142–424)
RBC: 3.7 M/uL — AB (ref 4.69–6.13)
RDW, POC: 13.6 %
WBC: 3 10*3/uL — AB (ref 4.6–10.2)

## 2013-05-01 LAB — COMPLETE METABOLIC PANEL WITH GFR
ALT: 10 U/L (ref 0–53)
AST: 12 U/L (ref 0–37)
Albumin: 3.9 g/dL (ref 3.5–5.2)
Alkaline Phosphatase: 102 U/L (ref 39–117)
BUN: 10 mg/dL (ref 6–23)
CO2: 25 mEq/L (ref 19–32)
Calcium: 9.2 mg/dL (ref 8.4–10.5)
Chloride: 102 mEq/L (ref 96–112)
Creat: 0.92 mg/dL (ref 0.50–1.35)
GFR, Est African American: 86 mL/min
GFR, Est Non African American: 74 mL/min
Glucose, Bld: 93 mg/dL (ref 70–99)
Potassium: 4.4 mEq/L (ref 3.5–5.3)
Sodium: 135 mEq/L (ref 135–145)
Total Bilirubin: 0.6 mg/dL (ref 0.3–1.2)
Total Protein: 6.5 g/dL (ref 6.0–8.3)

## 2013-05-01 LAB — POCT UA - MICROSCOPIC ONLY
Casts, Ur, LPF, POC: NEGATIVE
Crystals, Ur, HPF, POC: NEGATIVE
Mucus, UA: NEGATIVE
Yeast, UA: NEGATIVE

## 2013-05-01 LAB — POCT URINALYSIS DIPSTICK
Bilirubin, UA: NEGATIVE
Glucose, UA: NEGATIVE
Ketones, UA: NEGATIVE
Leukocytes, UA: NEGATIVE
Nitrite, UA: NEGATIVE
Protein, UA: NEGATIVE
Spec Grav, UA: <=1.005
Urobilinogen, UA: NEGATIVE
pH, UA: 5

## 2013-05-01 LAB — TSH: TSH: 4.295 u[IU]/mL (ref 0.350–4.500)

## 2013-05-01 LAB — AMYLASE: Amylase: 60 U/L (ref 0–105)

## 2013-05-01 LAB — LIPASE: Lipase: 59 U/L (ref 0–75)

## 2013-05-01 NOTE — Progress Notes (Signed)
Patient ID: Albert Allen, male   DOB: Aug 28, 1923, 79 y.o.   MRN: [id number] SUBJECTIVE: HPI: Here for follow up on prostatitis. Urination is a little better but occasionally has to hesitate for several minutes before he can urinate.  Patient is here for follow up of hypertension:  denies Headache;deniesChest Pain;denies weakness;denies Shortness of Breath or Orthopnea;denies Visual changes;denies palpitations;denies cough;denies pedal edema;denies symptoms of TIA or stroke; admits to Compliance with medications. denies Problems with medications.  .Patient is here for follow up of hyperlipidemia:  denies Headache;denies Chest Pain;denies weakness;denies Shortness of Breath and orthopnea;denies Visual changes;denies palpitations;denies cough;denies pedal edema;denies symptoms of TIA or stroke;deniesClaudication symptoms. admits to Compliance with medications; denies Problems with medications.   No fever , no night sweats, no blood in the stool. Has had colonoscopies and last one was supposedly clean. Feels like he is losing weight. Eats well.  PMH/PSH: reviewed/updated in Epic  SH/FH: reviewed/updated in Epic  Allergies: reviewed/updated in Epic  Medications: reviewed/updated in Epic  Immunizations: reviewed/updated in Epic  ROS:  As above in the HPI. All other systems are stable or negative.  OBJECTIVE: APPEARANCE:  Patient in no acute distress.The patient appeared well nourished and normally developed. Acyanotic. Waist: VITAL SIGNS:BP 163/70  Pulse 66  Temp(Src) 97.2 F (36.2 C) (Oral)  Wt 137 lb (62.143 kg)  BMI 21.13 kg/m2 WM Recheck BP 137/70  SKIN: warm and  Dry without overt rashes, tattoos and scars  HEAD and Neck: without JVD, Head and scalp: normal Eyes:No scleral icterus. Fundi normal, eye movements normal. Ears: Auricle normal, canal normal, Tympanic membranes normal, insufflation normal. Nose: normal Throat: normal Neck & thyroid: normal  CHEST  & LUNGS: Chest wall: normal Lungs: Clear  CVS: Reveals the PMI to be normally located. Regular rhythm, First and Second Heart sounds are normal,  absence of murmurs, rubs or gallops. Peripheral vasculature: Radial pulses: normal Dorsal pedis pulses: normal Posterior pulses: normal  ABDOMEN:  Appearance: normal Benign,, no organomegaly, no masses, no Abdominal Aortic enlargement. No Guarding , no rebound. No Bruits. Bowel sounds: normal  RECTAL: N/A GU: N/A  EXTREMETIES: nonedematous. Both Femoral and Pedal pulses are normal.  MUSCULOSKELETAL:  Spine: normal Joints: intact  NEUROLOGIC: oriented to time,place and person; nonfocal.  ASSESSMENT: Difficulty urinating - Plan: POCT UA - Microscopic Only, POCT urinalysis dipstick, Ambulatory referral to Urology  HYPOTHYROIDISM - Plan: TSH  HYPERTENSION - Plan: COMPLETE METABOLIC PANEL WITH GFR  HYPERLIPIDEMIA - Plan: COMPLETE METABOLIC PANEL WITH GFR, NMR Lipoprofile with Lipids  GOUT  Chronic prostatitis  CAD  Loss of weight - Plan: POCT CBC, Lipase, Amylase  PLAN: Will work up the couple of pounds of unintentional weight loss. Observe closely. Recheck in 2 weeks.  Orders Placed This Encounter  Procedures  . COMPLETE METABOLIC PANEL WITH GFR  . NMR Lipoprofile with Lipids  . Lipase  . Amylase  . TSH  . Ambulatory referral to Urology    Referral Priority:  Routine    Referral Type:  Consultation    Referral Reason:  Specialty Services Required    Requested Specialty:  Urology    Number of Visits Requested:  1  . POCT UA - Microscopic Only  . POCT urinalysis dipstick  . POCT CBC   No orders of the defined types were placed in this encounter.   He needs to see Dr Little Ishikawa and the referral was made to re-evaluate the BPH symptoms.  Return in about 2 weeks (around 05/15/2013).   Thelma Barge  Darylene Price, M.D.

## 2013-05-03 LAB — NMR LIPOPROFILE WITH LIPIDS
Cholesterol, Total: 177 mg/dL (ref <200)
HDL Particle Number: 33.9 umol/L (ref >=30.5)
HDL Size: 8.7 nm — ABNORMAL LOW (ref >=9.2)
HDL-C: 45 mg/dL (ref >=40)
LDL (calc): 93 mg/dL (ref <100)
LDL Particle Number: 1423 nmol/L — ABNORMAL HIGH (ref <1000)
LDL Size: 19.8 nm — ABNORMAL LOW (ref >20.5)
LP-IR Score: 60 — ABNORMAL HIGH (ref <=45)
Large HDL-P: 2.5 umol/L — ABNORMAL LOW (ref >=4.8)
Large VLDL-P: 1.9 nmol/L (ref <=2.7)
Small LDL Particle Number: 893 nmol/L — ABNORMAL HIGH (ref <=527)
Triglycerides: 196 mg/dL — ABNORMAL HIGH (ref <150)
VLDL Size: 45.8 nm (ref <=46.6)

## 2013-05-11 ENCOUNTER — Ambulatory Visit (INDEPENDENT_AMBULATORY_CARE_PROVIDER_SITE_OTHER)
Admission: RE | Admit: 2013-05-11 | Discharge: 2013-05-11 | Disposition: A | Discharge status: Home / Self Care | Payer: Medicare Other | Source: Ambulatory Visit | Attending: Cardiovascular Disease | Admitting: Cardiovascular Disease

## 2013-05-11 ENCOUNTER — Encounter: Payer: Self-pay | Admitting: Cardiovascular Disease

## 2013-05-11 ENCOUNTER — Ambulatory Visit (INDEPENDENT_AMBULATORY_CARE_PROVIDER_SITE_OTHER): Payer: Medicare Other | Admitting: Cardiovascular Disease

## 2013-05-11 VITALS — BP 154/64 | HR 66 | Ht 67.5 in | Wt 136.0 lb

## 2013-05-11 DIAGNOSIS — R55 Syncope and collapse: Secondary | ICD-10-CM

## 2013-05-11 DIAGNOSIS — R42 Dizziness and giddiness: Secondary | ICD-10-CM

## 2013-05-11 DIAGNOSIS — I2510 Atherosclerotic heart disease of native coronary artery without angina pectoris: Secondary | ICD-10-CM

## 2013-05-11 NOTE — Patient Instructions (Addendum)
Your physician wants you to follow-up in:  3 months.   Non-Cardiac CT scanning, (CAT scanning), is a noninvasive, special x-ray that produces cross-sectional images of the body using x-rays and a computer. CT scans help physicians diagnose and treat medical conditions. For some CT exams, a contrast material is used to enhance visibility in the area of the body being studied. CT scans provide greater clarity and reveal more details than regular x-ray exams. This will be done today   Your physician has requested that you have a carotid duplex. This test is an ultrasound of the carotid arteries in your neck. It looks at blood flow through these arteries that supply the brain with blood. Allow one hour for this exam. There are no restrictions or special instructions.
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
## 2013-05-15 ENCOUNTER — Encounter: Payer: Self-pay | Admitting: Family Medicine

## 2013-05-15 ENCOUNTER — Ambulatory Visit (INDEPENDENT_AMBULATORY_CARE_PROVIDER_SITE_OTHER): Payer: Medicare Other | Admitting: Family Medicine

## 2013-05-15 ENCOUNTER — Ambulatory Visit (INDEPENDENT_AMBULATORY_CARE_PROVIDER_SITE_OTHER): Payer: Medicare Other

## 2013-05-15 VITALS — BP 154/69 | HR 80 | Temp 97.2°F | Wt 135.5 lb

## 2013-05-15 DIAGNOSIS — I10 Essential (primary) hypertension: Secondary | ICD-10-CM

## 2013-05-15 DIAGNOSIS — E785 Hyperlipidemia, unspecified: Secondary | ICD-10-CM

## 2013-05-15 DIAGNOSIS — R972 Elevated prostate specific antigen [PSA]: Secondary | ICD-10-CM

## 2013-05-15 DIAGNOSIS — N400 Enlarged prostate without lower urinary tract symptoms: Secondary | ICD-10-CM

## 2013-05-15 DIAGNOSIS — R634 Abnormal weight loss: Secondary | ICD-10-CM

## 2013-05-15 DIAGNOSIS — N411 Chronic prostatitis: Secondary | ICD-10-CM

## 2013-05-15 NOTE — Progress Notes (Signed)
Patient ID: Albert Allen, male   DOB: [birth year], 79 y.o.   MRN: [id number] SUBJECTIVE: CC: Chief Complaint  Patient presents with  . Follow-up    follow up . urintating better     HPI: Her for follow up of BPH symptoms. Saw Dr Earlene Plater and he continued his tamsulosin that Dr Modesto Charon Rx. Urine is a bit better but occasionally has the BPH symptoms as described below. complains of straining to urinate. occassionally complains of strength of the urine stream is weaker. intermittently complains of frequency of urination. Sometimes.  denies urinating at nightoccassionally  denies blood in the urine denies history of UTI denies fever denies aggravating factors denies relieving factorsdenies of ED denies of sports that may aggravate symptoms, such as riding a bicycle. has had a Urologic evaluation recently with his Urologist  is seeing a Insurance underwriter. has not had a PSA test  has not had tried OTC meds:  is on medications for BPH. tamsulosin is  not on medications for Prostate cancer Continues to slowly lose weight. Has had colonoscopy and EGD with Dr Jarold Motto in [facility].  PMH/PSH: reviewed/updated in Epic  SH/FH: reviewed/updated in Epic  Allergies: reviewed/updated in Epic  Medications: reviewed/updated in Epic  Immunizations: reviewed/updated in Epic  ROS: As above in the HPI. All other systems are stable or negative.  OBJECTIVE: APPEARANCE:  Patient in no acute distress.The patient appeared well nourished and normally developed. Acyanotic. Waist: VITAL SIGNS:BP 154/69  Pulse 80  Temp(Src) 97.2 F (36.2 C) (Oral)  Wt 135 lb 8 oz (61.462 kg)  BMI 20.9 kg/m2 Slim elderly WM  SKIN: warm and  Dry without overt rashes, tattoos and scars  HEAD and Neck: without JVD, Head and scalp: normal Eyes:No scleral icterus. Fundi normal, eye movements normal. Ears: Auricle normal, canal normal, Tympanic membranes normal, insufflation normal. Nose: normal Throat: normal Neck &  thyroid: normal  CHEST & LUNGS: Chest wall: normal Lungs: Clear  CVS: Reveals the PMI to be normally located. Regular rhythm, First and Second Heart sounds are normal,  absence of murmurs, rubs or gallops. Peripheral vasculature: Radial pulses: normal Dorsal pedis pulses: normal Posterior pulses: normal  ABDOMEN:  Appearance: normal Benign, no organomegaly, no masses, no Abdominal Aortic enlargement. No Guarding , no rebound. No Bruits. Bowel sounds: normal  RECTAL: N/A GU: N/A  EXTREMETIES: nonedematous. Both Femoral and Pedal pulses are normal.  MUSCULOSKELETAL:  Spine: normal Joints: intact  NEUROLOGIC: oriented to time,place and person; nonfocal. Strength is normal Sensory is normal Reflexes are normal Cranial Nerves are normal.  ASSESSMENT: Loss of weight - Plan: DG Chest 2 View  PSA, INCREASED  HYPERTENSION  HYPERLIPIDEMIA  Chronic prostatitis  BPH (benign prostatic hyperplasia)  PLAN: [facility] reading (PRIMARY) by  Dr.Lawana Hartzell: Chest Xray:  Hyperinflation no acute findings.atherosclerosis of the thoracic aorta.                         Orders Placed This Encounter  Procedures  . DG Chest 2 View    Standing Status: Future     Number of Occurrences: 1     Standing Expiration Date: 07/15/2014    Order Specific Question:  Reason for Exam (SYMPTOM  OR DIAGNOSIS REQUIRED)    Answer:  ex smoker weight loss.occassional cough.    Order Specific Question:  Preferred imaging location?    Answer:  Internal   No orders of the defined types were placed in this encounter.   Results for orders placed in  visit on 05/01/13  COMPLETE METABOLIC PANEL WITH GFR      Result Value Range   Sodium 135  135 - 145 mEq/L   Potassium 4.4  3.5 - 5.3 mEq/L   Chloride 102  96 - 112 mEq/L   CO2 25  19 - 32 mEq/L   Glucose, Bld 93  70 - 99 mg/dL   BUN 10  6 - 23 mg/dL   Creat 1.47  8.29 - 5.62 mg/dL   Total Bilirubin 0.6  0.3 - 1.2 mg/dL   Alkaline Phosphatase 102  39 - 117 U/L    AST 12  0 - 37 U/L   ALT 10  0 - 53 U/L   Total Protein 6.5  6.0 - 8.3 g/dL   Albumin 3.9  3.5 - 5.2 g/dL   Calcium 9.2  8.4 - 13.0 mg/dL   GFR, Est African American 86     GFR, Est Non African American 74    NMR LIPOPROFILE WITH LIPIDS      Result Value Range   LDL Particle Number [id number] (*) <1000 nmol/L   LDL (calc) 93  <100 mg/dL   HDL-C 45  >=86 mg/dL   Triglycerides 578 (*) <150 mg/dL   Cholesterol, Total 469  <200 mg/dL   HDL Particle Number 62.9  >=52.8 umol/L   Large HDL-P 2.5 (*) >=4.8 umol/L   Large VLDL-P 1.9  <=2.7 nmol/L   Small LDL Particle Number [id number] (*) <=527 nmol/L   LDL Size 19.8 (*) >20.5 nm   HDL Size 8.7 (*) >=9.2 nm   VLDL Size 45.8  <=46.6 nm   LP-IR Score 60 (*) <=45  LIPASE      Result Value Range   Lipase 59  0 - 75 U/L  AMYLASE      Result Value Range   Amylase 60  0 - 105 U/L  TSH      Result Value Range   TSH 4.295  0.350 - 4.500 uIU/mL  POCT UA - MICROSCOPIC ONLY      Result Value Range   WBC, Ur, HPF, POC 1-5     RBC, urine, microscopic 5-8     Bacteria, U Microscopic occ     Mucus, UA neg     Epithelial cells, urine per micros occ     Crystals, Ur, HPF, POC neg     Casts, Ur, LPF, POC neg     Yeast, UA neg    POCT URINALYSIS DIPSTICK      Result Value Range   Color, UA clear     Clarity, UA clear     Glucose, UA neg     Bilirubin, UA neg     Ketones, UA neg     Spec Grav, UA <=1.005     Blood, UA small     pH, UA 5.0     Protein, UA neg     Urobilinogen, UA negative     Nitrite, UA neg     Leukocytes, UA Negative    POCT CBC      Result Value Range   WBC 3.0 (*) 4.6 - 10.2 K/uL   Lymph, poc 1.3  0.6 - 3.4   POC LYMPH PERCENT 42.0  10 - 50 %L   POC Granulocyte 1.6 (*) 2 - 6.9   Granulocyte percent 53.1  37 - 80 %G   RBC 3.7 (*) 4.69 - 6.13 M/uL   Hemoglobin 12.5 (*) 14.1 - 18.1 g/dL   HCT,  POC 36.2 (*) 43.5 - 53.7 %   MCV 97.5 (*) 80 - 97 fL   MCH, POC 33.7 (*) 27 - 31.2 pg   MCHC 34.5  31.8 - 35.4 g/dL   RDW, POC  98.1     Platelet Count, POC 191.0  142 - 424 K/uL   MPV 7.5  0 - 99.8 fL   Return in about 4 weeks (around 06/12/2013) for Recheck medical problems. Try supplementing with milk shakes, ensure. Discussed CT abdomen and pelvis: patient defers for a few weeks.Wants to see if his weight stabilizes.  Merve Hotard P. Modesto Charon, M.D.

## 2013-05-16 ENCOUNTER — Telehealth: Payer: Self-pay | Admitting: Cardiovascular Disease

## 2013-05-16 NOTE — Telephone Encounter (Signed)
New Problem:    Patient called in returning your call. Please call back. 

## 2013-05-16 NOTE — Telephone Encounter (Signed)
Spoke with pt and reviewed Head CT results with him.

## 2013-05-17 ENCOUNTER — Encounter (INDEPENDENT_AMBULATORY_CARE_PROVIDER_SITE_OTHER): Payer: Medicare Other

## 2013-05-17 DIAGNOSIS — R42 Dizziness and giddiness: Secondary | ICD-10-CM

## 2013-05-17 DIAGNOSIS — I6529 Occlusion and stenosis of unspecified carotid artery: Secondary | ICD-10-CM

## 2013-05-23 ENCOUNTER — Other Ambulatory Visit: Payer: Self-pay | Admitting: Internal Medicine

## 2013-06-05 ENCOUNTER — Other Ambulatory Visit: Payer: Self-pay | Admitting: Family Medicine

## 2013-06-05 ENCOUNTER — Other Ambulatory Visit: Payer: Self-pay | Admitting: Internal Medicine

## 2013-06-13 ENCOUNTER — Ambulatory Visit: Payer: Medicare Other | Admitting: Family Medicine

## 2013-06-26 ENCOUNTER — Ambulatory Visit (INDEPENDENT_AMBULATORY_CARE_PROVIDER_SITE_OTHER): Payer: Medicare Other | Admitting: Internal Medicine

## 2013-06-26 ENCOUNTER — Encounter: Payer: Self-pay | Admitting: Internal Medicine

## 2013-06-26 VITALS — BP 130/80 | HR 72 | Temp 97.6°F | Resp 16 | Ht 67.5 in | Wt 139.0 lb

## 2013-06-26 DIAGNOSIS — J4530 Mild persistent asthma, uncomplicated: Secondary | ICD-10-CM

## 2013-06-26 DIAGNOSIS — J45909 Unspecified asthma, uncomplicated: Secondary | ICD-10-CM

## 2013-06-26 DIAGNOSIS — N401 Enlarged prostate with lower urinary tract symptoms: Secondary | ICD-10-CM

## 2013-06-26 DIAGNOSIS — I10 Essential (primary) hypertension: Secondary | ICD-10-CM

## 2013-06-26 DIAGNOSIS — N138 Other obstructive and reflux uropathy: Secondary | ICD-10-CM

## 2013-06-26 DIAGNOSIS — R55 Syncope and collapse: Secondary | ICD-10-CM

## 2013-06-26 MED ORDER — ACLIDINIUM BROMIDE 400 MCG/ACT IN AEPB: 1.0000 | INHALATION_SPRAY | Freq: Every day | RESPIRATORY_TRACT | Status: DC

## 2013-06-26 NOTE — Progress Notes (Signed)
Subjective:    Patient ID: Albert Allen, male    DOB: 04-02-1923, 79 y.o.   MRN: [id number]  HPI Presents for "annual" exam Has noted several episodes of syncopy/dizzyness Last episode was at church and "fell out" for a few minutes and when  Recovered he was not post ictal And was fully oriented  History of esophageal spasm, anxiety Zantac did not work but he feels that the prilosec does not agree with him ( has read the inserts) He has noted weight loss but has stabilized Has not noted diarrhea Has not seen any dark stools Asthma stable  Review of Systems  Constitutional: Positive for fatigue and unexpected weight change.  HENT: Positive for hearing loss, rhinorrhea and postnasal drip.   Eyes: Negative.   Respiratory: Positive for shortness of breath.   Cardiovascular: Negative for chest pain, palpitations and leg swelling.  Gastrointestinal: Negative.   Endocrine: Negative.   Genitourinary: Positive for urgency and frequency.  Neurological: Positive for syncope.  Hematological: Negative.   Psychiatric/Behavioral: Positive for sleep disturbance. The patient is nervous/anxious.    Past Medical History  Diagnosis Date  . HYPOTHYROIDISM 05/31/2007  . HYPERLIPIDEMIA 03/27/2009  . GOUT 05/31/2007  . ANEMIA, B12 DEFICIENCY 03/29/2008  . Anxiety state, unspecified 11/19/2008  . ABUSE, ALCOHOL, IN REMISSION 05/31/2007  . CARPAL TUNNEL SYNDROME, RIGHT 09/05/2007  . 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 HYPERTENSION 05/31/2007  . CAD, NATIVE VESSEL 04/04/2009  . LEFT BUNDLE BRANCH BLOCK 02/11/2009  . SINUSITIS, CHRONIC NOS 05/31/2007  . ASTHMA 05/31/2007  . GERD 05/31/2007  . Chronic prostatitis 03/29/2008  . LOC OSTEOARTHROS NOT SPEC PRIM/SEC LOWER LEG 03/20/2010  . OSTEOARTHRITIS 05/31/2007  . HIP PAIN 03/20/2010  . DEGENERATIVE DISC DISEASE, CERVICAL SPINE 06/22/2007  . Adhesive capsulitis of shoulder 12/29/2007  . SYNCOPE 11/19/2008  . WEIGHT LOSS, RECENT 11/19/2008  . DYSPHAGIA  UNSPECIFIED 09/25/2008  . Hepatomegaly 09/25/2008  . PSA, INCREASED 03/20/2010  . ABNORMAL CV (STRESS) TEST 03/05/2009  . PERSONAL HX COLONIC POLYPS 03/05/1997    adenomatous   . Liver mass, left lobe   . Hiatal hernia   . Esophageal stricture     History   Social History  . Marital Status: Married    Spouse Name: N/A    Number of Children: N/A  . Years of Education: N/A   Occupational History  . retired    Social History Main Topics  . Smoking status: Former Smoker    Types: Cigarettes    Quit date: 12/27/2005  . Smokeless tobacco: Not on file  . Alcohol Use: No  . Drug Use: No  . Sexually Active: Not Currently   Other Topics Concern  . Not on file   Social History Narrative  . No narrative on file    Past Surgical History  Procedure Laterality Date  . Pul. colapse w/chest tube    . Tonsillectomy    . Carpel tunnel release    . Orif ulnar fracture  11/03/2012    Procedure: OPEN REDUCTION INTERNAL FIXATION (ORIF) ULNAR FRACTURE;  Surgeon: Velna Ochs, MD;  Location: [facility] OR;  Service: Orthopedics;  Laterality: Right;  ORIF radius and ulnar fractures    Family History  Problem Relation Age of Onset  . Cancer Mother     In back but unsure of what type of cancer  . COPD Father   . Colon cancer Neg Hx     Allergies  Allergen Reactions  . Amoxicillin     REACTION: unspecified  .  Ceclor (Cefaclor)   . Celebrex (Celecoxib)   . Levaquin (Levofloxacin In D5w)   . Penicillins Swelling    LIPS    Current Outpatient Prescriptions on File Prior to Visit  Medication Sig Dispense Refill  . ALPRAZolam (XANAX) 0.25 MG tablet Take 1 tablet (0.25 mg total) by mouth every 6 (six) hours as needed for sleep.  60 tablet  3  . aspirin EC 81 MG tablet Take 81 mg by mouth daily.      . Cholecalciferol (VITAMIN D PO) Take 1 tablet by mouth as needed.       . diclofenac (VOLTAREN) 75 MG EC tablet Take 75 mg by mouth every evening.      . fluticasone (FLONASE) 50 MCG/ACT nasal  spray Place 2 sprays into the nose daily.  16 g  5  . levothyroxine (SYNTHROID, LEVOTHROID) 75 MCG tablet Take 75 mcg by mouth daily.        Marland Kitchen losartan (COZAAR) 100 MG tablet Take 1 tablet (100 mg total) by mouth daily.  30 tablet  11  . meclizine (ANTIVERT) 25 MG tablet Take 1 tablet (25 mg total) by mouth 3 (three) times daily as needed.  30 tablet  3  . metoprolol tartrate (LOPRESSOR) 25 MG tablet Take 1/2 tab twice a day      . montelukast (SINGULAIR) 10 MG tablet TAKE ONE TABLET BY MOUTH AT BEDTIME  30 tablet  10  . omeprazole (PRILOSEC) 20 MG capsule Take 20 mg by mouth daily.      Marland Kitchen omeprazole (PRILOSEC) 20 MG capsule TAKE ONE CAPSULE BY MOUTH ONE TIME DAILY  30 capsule  8  . PROAIR HFA 108 (90 BASE) MCG/ACT inhaler INHALE 2 PUFFS BY MOUTH INTO THE LUNGS EVERY 6 HOURS AS NEEDED  9 each  10  . rosuvastatin (CRESTOR) 10 MG tablet Take 10 mg by mouth daily.        . tamsulosin (FLOMAX) 0.4 MG CAPS Take 1 capsule (0.4 mg total) by mouth daily after supper.  30 capsule  3   Current Facility-Administered Medications on File Prior to Visit  Medication Dose Route Frequency Provider Last Rate Last Dose  . methylPREDNISolone acetate (DEPO-MEDROL) injection 40 mg  40 mg Intra-articular Once Stacie Glaze, MD        BP 130/80  Pulse 72  Temp(Src) 97.6 F (36.4 C)  Resp 16  Ht 5' 7.5" (1.715 m)  Wt 139 lb (63.05 kg)  BMI 21.44 kg/m2       Objective:   Physical Exam  Constitutional: He is oriented to person, place, and time. He appears well-developed and well-nourished.  HENT:  Head: Normocephalic and atraumatic.  Eyes: Conjunctivae are normal. Pupils are equal, round, and reactive to light.  Neck: Normal range of motion. Neck supple.  Cardiovascular: Normal rate and regular rhythm.   Murmur heard. Pulmonary/Chest: Effort normal and breath sounds normal.  Abdominal: Soft. Bowel sounds are normal.  Genitourinary:  enlarged prostate  Musculoskeletal: Normal range of motion.   Neurological: He is alert and oriented to person, place, and time.  Skin: Skin is dry.          Assessment & Plan:  syncope Possible related to COPD? Possibly related to the xanax use? No drinking b 12 vitamins HH with GERD discussed prilosec  Recommend only using a half of a Xanax before going to church.  Asking patient to discuss the use of Flomax with his urologist for his medication this is the most  likely to be contributing to his presyncopal and syncopal episodes   I have spent more than 45 minutes examining this patient face-to-face of which over half was spent in counseling

## 2013-06-26 NOTE — Patient Instructions (Signed)
Ask your urologist about  Flomax as a potential cause of her dizziness

## 2013-07-25 ENCOUNTER — Other Ambulatory Visit: Payer: Self-pay | Admitting: Internal Medicine

## 2013-08-16 ENCOUNTER — Encounter: Payer: Self-pay | Admitting: Cardiovascular Disease

## 2013-08-16 ENCOUNTER — Ambulatory Visit (INDEPENDENT_AMBULATORY_CARE_PROVIDER_SITE_OTHER): Payer: Medicare Other | Admitting: Cardiovascular Disease

## 2013-08-16 VITALS — BP 150/74 | HR 56 | Ht 68.0 in | Wt 141.0 lb

## 2013-08-16 DIAGNOSIS — I2510 Atherosclerotic heart disease of native coronary artery without angina pectoris: Secondary | ICD-10-CM

## 2013-08-16 DIAGNOSIS — I10 Essential (primary) hypertension: Secondary | ICD-10-CM

## 2013-08-16 NOTE — Progress Notes (Signed)
History of Present Illness: 79 yo WM with past medical history significant for CAD, HTN, anemia, asthma/COPD, GERD, OA, gout hypothyroidism, hiatal hernia and alcohol abuse seen as a new patient 03/05/09 for evaluation of atypical chest pain, new LBBB and abnormal Myoview. Cardiac cath was performed on 03/13/09 and we found a 70-80% heavily calcified mid LAD that was severely diseased throughout the mid section with heavy calcification. There was diffuse moderately obstructive disease in the other vessels. I discussed his disease with him in the hospital and we elected for medical management given that he was asymptomatic. No stents were placed. Recent vascular screening with mild bilateral carotid artery disease, ABI of 0.9 right ,0.87 left. I saw him May 2014 after an episode of dizziness in church while sitting. He stood up to walk out and lost consciousness.  He quickly woke up. No palpitations. EMS came to the scene and BP was 110/62. He also notes getting knocked down by the lawnmower several weeks before that and he hit his head. He also noted some chest pain after meals. Echo showed normal LV size and function with mild AI and mild MR. Head CT without acute changes. Carotid artery dopplers June 2014 with mild bilateral disease. He has recently refused stress testing.   He is here today for follow up. He has had no recurrent syncope. He describes frequent nasal congestion. He has no exertional chest pains. His breathing has been ok.   Primary Care Physician: Darryll Capers  Last Lipid Profile:Lipid Panel     Component Value Date/Time   CHOL 164 09/22/2012 1051   TRIG 196* 05/01/2013 1059   TRIG 176.0* 09/22/2012 1051   HDL 41.90 09/22/2012 1051   CHOLHDL 4 09/22/2012 1051   VLDL 35.2 09/22/2012 1051   LDLCALC 93 05/01/2013 1059   LDLCALC 87 09/22/2012 1051     Past Medical History  Diagnosis Date  . HYPOTHYROIDISM 05/31/2007  . HYPERLIPIDEMIA 03/27/2009  . GOUT 05/31/2007  . ANEMIA, B12  DEFICIENCY 03/29/2008  . Anxiety state, unspecified 11/19/2008  . ABUSE, ALCOHOL, IN REMISSION 05/31/2007  . CARPAL TUNNEL SYNDROME, RIGHT 09/05/2007  . 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 HYPERTENSION 05/31/2007  . CAD, NATIVE VESSEL 04/04/2009  . LEFT BUNDLE BRANCH BLOCK 02/11/2009  . SINUSITIS, CHRONIC NOS 05/31/2007  . ASTHMA 05/31/2007  . GERD 05/31/2007  . Chronic prostatitis 03/29/2008  . LOC OSTEOARTHROS NOT SPEC PRIM/SEC LOWER LEG 03/20/2010  . OSTEOARTHRITIS 05/31/2007  . HIP PAIN 03/20/2010  . DEGENERATIVE DISC DISEASE, CERVICAL SPINE 06/22/2007  . Adhesive capsulitis of shoulder 12/29/2007  . SYNCOPE 11/19/2008  . WEIGHT LOSS, RECENT 11/19/2008  . DYSPHAGIA UNSPECIFIED 09/25/2008  . Hepatomegaly 09/25/2008  . PSA, INCREASED 03/20/2010  . ABNORMAL CV (STRESS) TEST 03/05/2009  . PERSONAL HX COLONIC POLYPS 03/05/1997    adenomatous   . Liver mass, left lobe   . Hiatal hernia   . Esophageal stricture     Past Surgical History  Procedure Laterality Date  . Pul. colapse w/chest tube    . Tonsillectomy    . Carpel tunnel release    . Orif ulnar fracture  11/03/2012    Procedure: OPEN REDUCTION INTERNAL FIXATION (ORIF) ULNAR FRACTURE;  Surgeon: Velna Ochs, MD;  Location: [facility] OR;  Service: Orthopedics;  Laterality: Right;  ORIF radius and ulnar fractures    Current Outpatient Prescriptions  Medication Sig Dispense Refill  . ALPRAZolam (XANAX) 0.25 MG tablet Take 1 tablet (0.25 mg total) by mouth every 6 (six) hours as  needed for sleep.  60 tablet  3  . aspirin EC 81 MG tablet Take 81 mg by mouth daily.      . Cholecalciferol (VITAMIN D PO) Take 1 tablet by mouth as needed.       . diclofenac (VOLTAREN) 75 MG EC tablet Take 75 mg by mouth every evening. Prn      . levothyroxine (SYNTHROID, LEVOTHROID) 75 MCG tablet Take 75 mcg by mouth daily.        Marland Kitchen losartan (COZAAR) 100 MG tablet Take 1 tablet (100 mg total) by mouth daily.  30 tablet  11  . metoprolol tartrate (LOPRESSOR) 25 MG tablet  Take 1/2 tab twice a day      . NON FORMULARY Singular as needed      . omeprazole (PRILOSEC) 20 MG capsule TAKE ONE CAPSULE BY MOUTH ONE TIME DAILY  30 capsule  8  . PROAIR HFA 108 (90 BASE) MCG/ACT inhaler INHALE 2 PUFFS BY MOUTH INTO THE LUNGS EVERY 6 HOURS AS NEEDED  9 each  10  . rosuvastatin (CRESTOR) 10 MG tablet Take 10 mg by mouth daily.         No current facility-administered medications for this visit.    Allergies  Allergen Reactions  . Amoxicillin     REACTION: unspecified  . Ceclor [Cefaclor]   . Celebrex [Celecoxib]   . Levaquin [Levofloxacin In D5w]   . Penicillins Swelling    LIPS    History   Social History  . Marital Status: Married    Spouse Name: N/A    Number of Children: N/A  . Years of Education: N/A   Occupational History  . retired    Social History Main Topics  . Smoking status: Former Smoker    Types: Cigarettes    Quit date: 12/27/2005  . Smokeless tobacco: Not on file  . Alcohol Use: No  . Drug Use: No  . Sexual Activity: Not Currently   Other Topics Concern  . Not on file   Social History Narrative  . No narrative on file    Family History  Problem Relation Age of Onset  . Cancer Mother     In back but unsure of what type of cancer  . COPD Father   . Colon cancer Neg Hx     Review of Systems:  As stated in the HPI and otherwise negative.   BP 150/74  Pulse 56  Ht 5\' 8"  (1.727 m)  Wt 141 lb (63.957 kg)  BMI 21.44 kg/m2  Physical Examination: General: Well developed, well nourished, NAD HEENT: OP clear, mucus membranes moist SKIN: warm, dry. No rashes. Neuro: No focal deficits Musculoskeletal: Muscle strength 5/5 all ext Psychiatric: Mood and affect normal Neck: No JVD, no carotid bruits, no thyromegaly, no lymphadenopathy. Lungs:Clear bilaterally, no wheezes, rhonci, crackles Cardiovascular: Regular rate and rhythm. No murmurs, gallops or rubs. Abdomen:Soft. Bowel sounds present. Non-tender.  Extremities: No  lower extremity edema. Pulses are 2 + in the bilateral DP/PT.  Echo 04/25/13: Left ventricle: The cavity size was normal. Wall thickness was increased in a pattern of mild LVH. Systolic function was normal. The estimated ejection fraction was in the range of 55% to 65%. Wall motion was normal; there were no regional wall motion abnormalities. Doppler parameters are consistent with abnormal left ventricular relaxation (grade 1 diastolic dysfunction). - Aortic valve: Mild regurgitation. - Mitral valve: Calcified annulus. Mild regurgitation.   Assessment and Plan:   1. CAD: Stable. He has  no chest pain or SOB. Continue ASA, statin, beta blocker. I have discussed arranging a stress test to exclude ischemia with known LAD disease. He refuses stress testing.   2. Syncope:  No recurrence. BP is stable. Recent head CT without acute changes and recent carotid artery dopplers without obstructive disease.   3. HTN: He has been taking Cozaar 50 mg po Qaily. Will increase to 100 mg po Qdaily.

## 2013-08-16 NOTE — Patient Instructions (Addendum)
Your physician wants you to follow-up in: 12 months.  You will receive a reminder letter in the mail two months in advance. If you don't receive a letter, please call our office to schedule the follow-up appointment.  Your physician recommends that you continue on your current medications as directed. Please refer to the Current Medication list given to you today.   

## 2013-10-17 ENCOUNTER — Ambulatory Visit: Payer: Medicare Other | Admitting: Gastroenterology

## 2013-10-23 ENCOUNTER — Other Ambulatory Visit: Payer: Self-pay | Admitting: Internal Medicine

## 2013-11-01 ENCOUNTER — Ambulatory Visit (INDEPENDENT_AMBULATORY_CARE_PROVIDER_SITE_OTHER): Payer: Medicare Other | Admitting: Internal Medicine

## 2013-11-01 ENCOUNTER — Encounter: Payer: Self-pay | Admitting: Internal Medicine

## 2013-11-01 VITALS — BP 134/72 | HR 68 | Temp 97.5°F | Resp 18 | Ht 68.0 in | Wt 140.0 lb

## 2013-11-01 DIAGNOSIS — I10 Essential (primary) hypertension: Secondary | ICD-10-CM

## 2013-11-01 DIAGNOSIS — J45909 Unspecified asthma, uncomplicated: Secondary | ICD-10-CM

## 2013-11-01 DIAGNOSIS — K219 Gastro-esophageal reflux disease without esophagitis: Secondary | ICD-10-CM

## 2013-11-01 MED ORDER — DEXLANSOPRAZOLE 60 MG PO CPDR: 60.0000 mg | DELAYED_RELEASE_CAPSULE | Freq: Every day | ORAL | Status: DC

## 2013-11-01 MED ORDER — DOXYCYCLINE HYCLATE 100 MG PO TABS: 100.0000 mg | ORAL_TABLET | Freq: Two times a day (BID) | ORAL | Status: DC

## 2013-11-01 NOTE — Progress Notes (Signed)
Subjective:    Patient ID: Albert Allen, male    DOB: [birth year], 79 y.o.   MRN: [id number]  Hypertension  Hyperlipidemia  Benign Prostatic Hypertrophy Irritative symptoms include frequency and urgency.  Asthma Associated symptoms include appetite change. His past medical history is significant for asthma.   COPD/ asthma Mild nausea and dysuria with possible prostatitis    Review of Systems  Constitutional: Positive for appetite change and fatigue.  HENT: Negative.   Eyes: Negative.   Gastrointestinal: Positive for abdominal pain and abdominal distention.  Endocrine: Negative.   Genitourinary: Positive for urgency and frequency.  Neurological: Positive for weakness.   Past Medical History  Diagnosis Date  . HYPOTHYROIDISM 05/31/2007  . HYPERLIPIDEMIA 03/27/2009  . GOUT 05/31/2007  . ANEMIA, B12 DEFICIENCY 03/29/2008  . Anxiety state, unspecified 11/19/2008  . ABUSE, ALCOHOL, IN REMISSION 05/31/2007  . CARPAL TUNNEL SYNDROME, RIGHT 09/05/2007  . 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 HYPERTENSION 05/31/2007  . CAD, NATIVE VESSEL 04/04/2009  . LEFT BUNDLE BRANCH BLOCK 02/11/2009  . SINUSITIS, CHRONIC NOS 05/31/2007  . ASTHMA 05/31/2007  . GERD 05/31/2007  . Chronic prostatitis 03/29/2008  . LOC OSTEOARTHROS NOT SPEC PRIM/SEC LOWER LEG 03/20/2010  . OSTEOARTHRITIS 05/31/2007  . HIP PAIN 03/20/2010  . DEGENERATIVE DISC DISEASE, CERVICAL SPINE 06/22/2007  . Adhesive capsulitis of shoulder 12/29/2007  . SYNCOPE 11/19/2008  . WEIGHT LOSS, RECENT 11/19/2008  . DYSPHAGIA UNSPECIFIED 09/25/2008  . Hepatomegaly 09/25/2008  . PSA, INCREASED 03/20/2010  . ABNORMAL CV (STRESS) TEST 03/05/2009  . PERSONAL HX COLONIC POLYPS 03/05/1997    adenomatous   . Liver mass, left lobe   . Hiatal hernia   . Esophageal stricture     History   Social History  . Marital Status: Married    Spouse Name: N/A    Number of Children: N/A  . Years of Education: N/A   Occupational History  . retired     Social History Main Topics  . Smoking status: Former Smoker    Types: Cigarettes    Quit date: 12/27/2005  . Smokeless tobacco: Not on file  . Alcohol Use: No  . Drug Use: No  . Sexual Activity: Not Currently   Other Topics Concern  . Not on file   Social History Narrative  . No narrative on file    Past Surgical History  Procedure Laterality Date  . Pul. colapse w/chest tube    . Tonsillectomy    . Carpel tunnel release    . Orif ulnar fracture  11/03/2012    Procedure: OPEN REDUCTION INTERNAL FIXATION (ORIF) ULNAR FRACTURE;  Surgeon: Velna Ochs, MD;  Location: [facility] OR;  Service: Orthopedics;  Laterality: Right;  ORIF radius and ulnar fractures    Family History  Problem Relation Age of Onset  . Cancer Mother     In back but unsure of what type of cancer  . COPD Father   . Colon cancer Neg Hx     Allergies  Allergen Reactions  . Amoxicillin     REACTION: unspecified  . Ceclor [Cefaclor]   . Celebrex [Celecoxib]   . Levaquin [Levofloxacin In D5w]   . Penicillins Swelling    LIPS    Current Outpatient Prescriptions on File Prior to Visit  Medication Sig Dispense Refill  . ALPRAZolam (XANAX) 0.25 MG tablet Take 1 tablet (0.25 mg total) by mouth every 6 (six) hours as needed for sleep.  60 tablet  3  . aspirin EC 81 MG tablet  Take 81 mg by mouth daily.      . Cholecalciferol (VITAMIN D PO) Take 1 tablet by mouth as needed.       . diclofenac (VOLTAREN) 75 MG EC tablet Take 75 mg by mouth every evening. Prn      . levothyroxine (SYNTHROID, LEVOTHROID) 75 MCG tablet Take 75 mcg by mouth daily.        Marland Kitchen losartan (COZAAR) 100 MG tablet TAKE ONE TABLET BY MOUTH ONE TIME DAILY  30 tablet  10  . metoprolol tartrate (LOPRESSOR) 25 MG tablet Take 1/2 tab twice a day      . NON FORMULARY Singular as needed      . omeprazole (PRILOSEC) 20 MG capsule TAKE ONE CAPSULE BY MOUTH ONE TIME DAILY  30 capsule  8  . PROAIR HFA 108 (90 BASE) MCG/ACT inhaler INHALE 2 PUFFS BY  MOUTH INTO THE LUNGS EVERY 6 HOURS AS NEEDED  9 each  10  . rosuvastatin (CRESTOR) 10 MG tablet Take 10 mg by mouth daily.         No current facility-administered medications on file prior to visit.    BP 134/72  Pulse 68  Temp(Src) 97.5 F (36.4 C)  Resp 18  Ht 5\' 8"  (1.727 m)  Wt 140 lb (63.504 kg)  BMI 21.29 kg/m2       Objective:   Physical Exam  Nursing note and vitals reviewed. Constitutional: He appears well-developed and well-nourished.  HENT:  Head: Normocephalic and atraumatic.  Eyes: Conjunctivae are normal. Pupils are equal, round, and reactive to light.  Neck: Normal range of motion. Neck supple.  Cardiovascular: Normal rate and regular rhythm.   Pulmonary/Chest: Effort normal. He has wheezes.  Abdominal: Soft. Bowel sounds are normal.          Assessment & Plan:  Mild nausia in the setting of prostatitis Stable COPD with asthma with mild flair may be related to increased GERD  ( has stopped the Prilosec due to "risks" on the box) I am concerned that his asthma and hoarseness is related to GERD    gastritis

## 2013-11-01 NOTE — Patient Instructions (Signed)
The patient is instructed to continue all medications as prescribed. Schedule followup with check out clerk upon leaving the clinic  

## 2013-11-01 NOTE — Progress Notes (Signed)
Pre visit review using our clinic review tool, if applicable. No additional management support is needed unless otherwise documented below in the visit note. 

## 2013-12-01 ENCOUNTER — Ambulatory Visit (INDEPENDENT_AMBULATORY_CARE_PROVIDER_SITE_OTHER): Payer: Medicare Other | Admitting: Family Medicine

## 2013-12-01 ENCOUNTER — Encounter: Payer: Self-pay | Admitting: Family Medicine

## 2013-12-01 VITALS — BP 154/71 | HR 81 | Temp 97.0°F | Ht 68.0 in | Wt 140.0 lb

## 2013-12-01 DIAGNOSIS — J209 Acute bronchitis, unspecified: Secondary | ICD-10-CM

## 2013-12-01 MED ORDER — METHYLPREDNISOLONE ACETATE 80 MG/ML IJ SUSP
80.0000 mg | Freq: Once | INTRAMUSCULAR | Status: AC
  Administered 2013-12-01: 80 mg via INTRAMUSCULAR

## 2013-12-01 MED ORDER — BENZONATATE 100 MG PO CAPS: 100.0000 mg | ORAL_CAPSULE | Freq: Three times a day (TID) | ORAL | Status: DC | PRN

## 2013-12-01 MED ORDER — AZITHROMYCIN 250 MG PO TABS: ORAL_TABLET | ORAL | Status: DC

## 2013-12-01 NOTE — Progress Notes (Signed)
   Subjective:    Patient ID: Albert Allen, male    DOB: Mar 06, 1923, 80 y.o.   MRN: [id number]  HPI This 80 y.o. male presents for evaluation of weakness, fatigue, shortness of breath, and cough. He has hx of asthma..   Review of Systems No chest pain, SOB, HA, dizziness, vision change, N/V, diarrhea, constipation, dysuria, urinary urgency or frequency, myalgias, arthralgias or rash.     Objective:   Physical Exam Vital signs noted  Well developed well nourished male.  HEENT - Head atraumatic Normocephalic                Eyes - PERRLA, Conjuctiva - clear Sclera- Clear EOMI                Ears - EAC's Wnl TM's Wnl Gross Hearing WNL                Nose - Nares patent                 Throat - oropharanx wnl Respiratory - Lungs CTA bilateral Cardiac - RRR S1 and S2 without murmur GI - Abdomen soft Nontender and bowel sounds active x 4 Extremities - No edema. Neuro - Grossly intact.      Assessment & Plan:  Acute bronchitis - Plan: azithromycin (ZITHROMAX) 250 MG tablet, methylPREDNISolone acetate (DEPO-MEDROL) injection 80 mg, benzonatate (TESSALON PERLES) 100 MG capsule. Push po fluids, rest, tylenol and motrin otc prn as directed for fever, arthralgias, and myalgias.  Follow up prn if sx's continue or persist.  Lysbeth Penner FNP

## 2013-12-01 NOTE — Patient Instructions (Signed)

## 2013-12-26 ENCOUNTER — Other Ambulatory Visit: Payer: Self-pay

## 2013-12-26 MED ORDER — ALPRAZOLAM 0.25 MG PO TABS: 0.2500 mg | ORAL_TABLET | Freq: Four times a day (QID) | ORAL | Status: DC | PRN

## 2013-12-26 NOTE — Telephone Encounter (Signed)
Last seen 12/01/13  [facility] [place]  If approved route to nurse to call into [facility]

## 2014-01-01 NOTE — Telephone Encounter (Signed)
Called in to [facility] 

## 2014-01-22 ENCOUNTER — Encounter: Payer: Self-pay | Admitting: Family Medicine

## 2014-01-22 ENCOUNTER — Ambulatory Visit (INDEPENDENT_AMBULATORY_CARE_PROVIDER_SITE_OTHER): Payer: Medicare Other | Admitting: Family Medicine

## 2014-01-22 VITALS — BP 155/61 | HR 74 | Temp 96.3°F | Ht 68.0 in | Wt 139.8 lb

## 2014-01-22 DIAGNOSIS — J069 Acute upper respiratory infection, unspecified: Secondary | ICD-10-CM

## 2014-01-22 DIAGNOSIS — R110 Nausea: Secondary | ICD-10-CM

## 2014-01-22 DIAGNOSIS — N390 Urinary tract infection, site not specified: Secondary | ICD-10-CM

## 2014-01-22 DIAGNOSIS — B349 Viral infection, unspecified: Secondary | ICD-10-CM

## 2014-01-22 DIAGNOSIS — F411 Generalized anxiety disorder: Secondary | ICD-10-CM

## 2014-01-22 DIAGNOSIS — B9789 Other viral agents as the cause of diseases classified elsewhere: Secondary | ICD-10-CM

## 2014-01-22 LAB — POCT URINALYSIS DIPSTICK
Bilirubin, UA: NEGATIVE
GLUCOSE UA: NEGATIVE
Ketones, UA: NEGATIVE
Leukocytes, UA: NEGATIVE
Nitrite, UA: NEGATIVE
PROTEIN UA: NEGATIVE
RBC UA: NEGATIVE
Spec Grav, UA: <=1.005
UROBILINOGEN UA: NEGATIVE
pH, UA: 5

## 2014-01-22 LAB — POCT UA - MICROSCOPIC ONLY
Bacteria, U Microscopic: NEGATIVE
Casts, Ur, LPF, POC: NEGATIVE
Crystals, Ur, HPF, POC: NEGATIVE
MUCUS UA: NEGATIVE
RBC, urine, microscopic: NEGATIVE
WBC, Ur, HPF, POC: NEGATIVE
YEAST UA: NEGATIVE

## 2014-01-22 MED ORDER — ALPRAZOLAM 0.25 MG PO TABS: 0.2500 mg | ORAL_TABLET | Freq: Four times a day (QID) | ORAL | Status: DC | PRN

## 2014-01-22 MED ORDER — PROMETHAZINE HCL 12.5 MG PO TABS: 12.5000 mg | ORAL_TABLET | Freq: Three times a day (TID) | ORAL | Status: DC | PRN

## 2014-01-22 MED ORDER — AZITHROMYCIN 250 MG PO TABS: ORAL_TABLET | ORAL | Status: DC

## 2014-01-22 NOTE — Progress Notes (Signed)
   Subjective:    Patient ID: ALDINE GRAINGER, male    DOB: [birth year], 80 y.o.   MRN: [id number]  HPI  This 80 y.o. male presents for evaluation of URI sx's and nausea.  He needs refills on his xanax.  Review of Systems No chest pain, SOB, HA, dizziness, vision change, N/V, diarrhea, constipation, dysuria, urinary urgency or frequency, myalgias, arthralgias or rash.     Objective:   Physical Exam  Vital signs noted  Well developed well nourished male.  HEENT - Head atraumatic Normocephalic                Eyes - PERRLA, Conjuctiva - clear Sclera- Clear EOMI                Ears - EAC's Wnl TM's Wnl Gross Hearing WNL                Nose - Nares patent                 Throat - oropharanx wnl Respiratory - Lungs CTA bilateral Cardiac - RRR S1 and S2 without murmur GI - Abdomen soft Nontender and bowel sounds active x 4 Extremities - No edema. Neuro - Grossly intact.      Assessment & Plan:  URI (upper respiratory infection) - Plan: azithromycin (ZITHROMAX) 250 MG tablet  Nausea alone - Plan: promethazine (PHENERGAN) 12.5 MG tablet  Viral syndrome - Plan: promethazine (PHENERGAN) 12.5 MG tablet  Anxiety state, unspecified - Plan: ALPRAZolam (XANAX) 0.25 MG tablet, DISCONTINUED: ALPRAZolam (XANAX) 0.25 MG tablet  Lysbeth Penner FNP

## 2014-01-22 NOTE — Addendum Note (Signed)
Addended by: Earlene Plater on: 01/22/2014 02:55 PM   Modules accepted: Orders

## 2014-01-24 LAB — URINE CULTURE: ORGANISM ID, BACTERIA: NO GROWTH

## 2014-03-14 ENCOUNTER — Encounter: Payer: Self-pay | Admitting: Family Medicine

## 2014-03-14 ENCOUNTER — Ambulatory Visit (INDEPENDENT_AMBULATORY_CARE_PROVIDER_SITE_OTHER): Payer: Medicare Other | Admitting: Family Medicine

## 2014-03-14 VITALS — BP 179/73 | HR 66 | Temp 96.7°F | Ht 68.0 in | Wt 136.2 lb

## 2014-03-14 DIAGNOSIS — R109 Unspecified abdominal pain: Secondary | ICD-10-CM

## 2014-03-14 DIAGNOSIS — K2970 Gastritis, unspecified, without bleeding: Secondary | ICD-10-CM

## 2014-03-14 DIAGNOSIS — K2990 Gastroduodenitis, unspecified, without bleeding: Secondary | ICD-10-CM

## 2014-03-14 DIAGNOSIS — R110 Nausea: Secondary | ICD-10-CM

## 2014-03-14 DIAGNOSIS — J45909 Unspecified asthma, uncomplicated: Secondary | ICD-10-CM

## 2014-03-14 DIAGNOSIS — R1030 Lower abdominal pain, unspecified: Secondary | ICD-10-CM

## 2014-03-14 LAB — POCT URINALYSIS DIPSTICK
Bilirubin, UA: NEGATIVE
Glucose, UA: NEGATIVE
Ketones, UA: NEGATIVE
Leukocytes, UA: NEGATIVE
Nitrite, UA: NEGATIVE
Protein, UA: NEGATIVE
Spec Grav, UA: 1.015
Urobilinogen, UA: NEGATIVE
pH, UA: 6

## 2014-03-14 LAB — POCT UA - MICROSCOPIC ONLY
Bacteria, U Microscopic: NEGATIVE
Casts, Ur, LPF, POC: NEGATIVE
Crystals, Ur, HPF, POC: NEGATIVE
Mucus, UA: NEGATIVE
WBC, Ur, HPF, POC: NEGATIVE
Yeast, UA: NEGATIVE

## 2014-03-14 MED ORDER — MONTELUKAST SODIUM 10 MG PO TABS: 10.0000 mg | ORAL_TABLET | Freq: Every day | ORAL | Status: DC

## 2014-03-14 MED ORDER — DICYCLOMINE HCL 10 MG PO CAPS: 10.0000 mg | ORAL_CAPSULE | Freq: Three times a day (TID) | ORAL | Status: DC

## 2014-03-14 MED ORDER — ONDANSETRON 8 MG PO TBDP: 8.0000 mg | ORAL_TABLET | Freq: Three times a day (TID) | ORAL | Status: DC | PRN

## 2014-03-14 NOTE — Progress Notes (Signed)
   Subjective:    Patient ID: Albert Allen, male    DOB: Jul 15, 1923, 80 y.o.   MRN: [id number]  HPI This 80 y.o. male presents for evaluation of nausea and abdominal pain.  He has lower abdominal pain And nausea for 2 days.  He states his brother died and the funeral was last week so in his grief he went and bought a bottle of booze and had 6 ounces the first night and finished it off the next.  He has developed the nausea and abdominal pain after this.  He has stopped taking prilosec.   Review of Systems C/o abdominal pain and nausea No chest pain, SOB, HA, dizziness, vision change, N/V, diarrhea, constipation, dysuria, urinary urgency or frequency, myalgias, arthralgias or rash.     Objective:   Physical Exam  Vital signs noted  Well developed well nourished male.  HEENT - Head atraumatic Normocephalic                Eyes - PERRLA, Conjuctiva - clear Sclera- Clear EOMI                Ears - EAC's Wnl TM's Wnl Gross Hearing WNL                Throat - oropharanx wnl Respiratory - Lungs CTA bilateral Cardiac - RRR S1 and S2 without murmur GI - Abdomen soft Nontender and bowel sounds active x 4 Extremities - No edema. Neuro - Grossly intact.      Assessment & Plan:  Nausea alone - Plan: POCT UA - Microscopic Only, POCT urinalysis dipstick, ondansetron (ZOFRAN ODT) 8 MG disintegrating tablet, dicyclomine (BENTYL) 10 MG capsule  Lower abdominal pain - Plan: POCT UA - Microscopic Only, POCT urinalysis dipstick, ondansetron (ZOFRAN ODT) 8 MG disintegrating tablet, dicyclomine (BENTYL) 10 MG capsule  Unspecified gastritis and gastroduodenitis without mention of hemorrhage - Plan: ondansetron (ZOFRAN ODT) 8 MG disintegrating tablet, dicyclomine (BENTYL) 10 MG capsule  Asthma - Plan: montelukast (SINGULAIR) 10 MG tablet  Discussed to refrain from ETOH and to take nexium 40mg  bid for a week and then to resume prilosec rx.  Samples of nexium given to patient.  Lysbeth Penner  FNP

## 2014-03-20 ENCOUNTER — Other Ambulatory Visit: Payer: Self-pay | Admitting: Internal Medicine

## 2014-05-18 ENCOUNTER — Other Ambulatory Visit: Payer: Self-pay | Admitting: Family Medicine

## 2014-05-21 ENCOUNTER — Telehealth: Payer: Self-pay | Admitting: Family Medicine

## 2014-05-21 NOTE — Telephone Encounter (Signed)
appt made and rf done for pt that was pending

## 2014-06-04 ENCOUNTER — Ambulatory Visit (INDEPENDENT_AMBULATORY_CARE_PROVIDER_SITE_OTHER): Payer: Medicare Other

## 2014-06-04 ENCOUNTER — Encounter: Payer: Self-pay | Admitting: Family Medicine

## 2014-06-04 ENCOUNTER — Ambulatory Visit (INDEPENDENT_AMBULATORY_CARE_PROVIDER_SITE_OTHER): Payer: Medicare Other | Admitting: Family Medicine

## 2014-06-04 VITALS — BP 174/79 | HR 71 | Temp 96.5°F | Ht 68.0 in

## 2014-06-04 DIAGNOSIS — R300 Dysuria: Secondary | ICD-10-CM

## 2014-06-04 DIAGNOSIS — N400 Enlarged prostate without lower urinary tract symptoms: Secondary | ICD-10-CM

## 2014-06-04 DIAGNOSIS — E039 Hypothyroidism, unspecified: Secondary | ICD-10-CM

## 2014-06-04 DIAGNOSIS — R350 Frequency of micturition: Secondary | ICD-10-CM

## 2014-06-04 DIAGNOSIS — R109 Unspecified abdominal pain: Secondary | ICD-10-CM

## 2014-06-04 DIAGNOSIS — I10 Essential (primary) hypertension: Secondary | ICD-10-CM

## 2014-06-04 DIAGNOSIS — F411 Generalized anxiety disorder: Secondary | ICD-10-CM

## 2014-06-04 DIAGNOSIS — E785 Hyperlipidemia, unspecified: Secondary | ICD-10-CM

## 2014-06-04 LAB — POCT UA - MICROSCOPIC ONLY
Bacteria, U Microscopic: NEGATIVE
Casts, Ur, LPF, POC: NEGATIVE
Crystals, Ur, HPF, POC: NEGATIVE
Mucus, UA: NEGATIVE
WBC, Ur, HPF, POC: NEGATIVE
Yeast, UA: NEGATIVE

## 2014-06-04 LAB — POCT URINALYSIS DIPSTICK
Bilirubin, UA: NEGATIVE
Glucose, UA: NEGATIVE
Ketones, UA: NEGATIVE
Leukocytes, UA: NEGATIVE
Nitrite, UA: NEGATIVE
Protein, UA: NEGATIVE
Spec Grav, UA: 1.01
Urobilinogen, UA: NEGATIVE
pH, UA: 6.5

## 2014-06-04 LAB — POCT CBC
Granulocyte percent: 56.4 %G (ref 37–80)
HCT, POC: 38.9 % — AB (ref 43.5–53.7)
Hemoglobin: 12.7 g/dL — AB (ref 14.1–18.1)
Lymph, poc: 1.1 (ref 0.6–3.4)
MCH, POC: 33.3 pg — AB (ref 27–31.2)
MCHC: 32.6 g/dL (ref 31.8–35.4)
MCV: 102 fL — AB (ref 80–97)
MPV: 7.5 fL (ref 0–99.8)
POC Granulocyte: 1.6 — AB (ref 2–6.9)
POC LYMPH PERCENT: 38.2 %L (ref 10–50)
Platelet Count, POC: 171 10*3/uL (ref 142–424)
RBC: 3.8 M/uL — AB (ref 4.69–6.13)
RDW, POC: 13.1 %
WBC: 2.9 10*3/uL — AB (ref 4.6–10.2)

## 2014-06-04 MED ORDER — ALPRAZOLAM 0.25 MG PO TABS: 0.2500 mg | ORAL_TABLET | Freq: Four times a day (QID) | ORAL | Status: DC | PRN

## 2014-06-04 MED ORDER — NITROFURANTOIN MONOHYD MACRO 100 MG PO CAPS: 100.0000 mg | ORAL_CAPSULE | Freq: Two times a day (BID) | ORAL | Status: DC

## 2014-06-04 MED ORDER — CIPROFLOXACIN HCL 500 MG PO TABS: 500.0000 mg | ORAL_TABLET | Freq: Two times a day (BID) | ORAL | Status: DC

## 2014-06-04 NOTE — Patient Instructions (Signed)
Preventive Care for Adults A healthy lifestyle and preventive care can promote health and wellness. Preventive health guidelines for men include the following key practices:  A routine yearly physical is a good way to check with your health care provider about your health and preventative screening. It is a chance to share any concerns and updates on your health and to receive a thorough exam.  Visit your dentist for a routine exam and preventative care every 6 months. Brush your teeth twice a day and floss once a day. Good oral hygiene prevents tooth decay and gum disease.  The frequency of eye exams is based on your age, health, family medical history, use of contact lenses, and other factors. Follow your health care provider's recommendations for frequency of eye exams.  Eat a healthy diet. Foods such as vegetables, fruits, whole grains, low-fat dairy products, and lean protein foods contain the nutrients you need without too many calories. Decrease your intake of foods high in solid fats, added sugars, and salt. Eat the right amount of calories for you.Get information about a proper diet from your health care provider, if necessary.  Regular physical exercise is one of the most important things you can do for your health. Most adults should get at least 150 minutes of moderate-intensity exercise (any activity that increases your heart rate and causes you to sweat) each week. In addition, most adults need muscle-strengthening exercises on 2 or more days a week.  Maintain a healthy weight. The body mass index (BMI) is a screening tool to identify possible weight problems. It provides an estimate of body fat based on height and weight. Your health care provider can find your BMI and can help you achieve or maintain a healthy weight.For adults 20 years and older:  A BMI below 18.5 is considered underweight.  A BMI of 18.5 to 24.9 is normal.  A BMI of 25 to 29.9 is considered overweight.  A BMI  of 30 and above is considered obese.  Maintain normal blood lipids and cholesterol levels by exercising and minimizing your intake of saturated fat. Eat a balanced diet with plenty of fruit and vegetables. Blood tests for lipids and cholesterol should begin at age 50 and be repeated every 5 years. If your lipid or cholesterol levels are high, you are over 50, or you are at high risk for heart disease, you may need your cholesterol levels checked more frequently.Ongoing high lipid and cholesterol levels should be treated with medicines if diet and exercise are not working.  If you smoke, find out from your health care provider how to quit. If you do not use tobacco, do not start.  Lung cancer screening is recommended for adults aged 73-80 years who are at high risk for developing lung cancer because of a history of smoking. A yearly low-dose CT scan of the lungs is recommended for people who have at least a 30-pack-year history of smoking and are a current smoker or have quit within the past 15 years. A pack year of smoking is smoking an average of 1 pack of cigarettes a day for 1 year (for example: 1 pack a day for 30 years or 2 packs a day for 15 years). Yearly screening should continue until the smoker has stopped smoking for at least 15 years. Yearly screening should be stopped for people who develop a health problem that would prevent them from having lung cancer treatment.  If you choose to drink alcohol, do not have more than  2 drinks per day. One drink is considered to be 12 ounces (355 mL) of beer, 5 ounces (148 mL) of wine, or 1.5 ounces (44 mL) of liquor.  Avoid use of street drugs. Do not share needles with anyone. Ask for help if you need support or instructions about stopping the use of drugs.  High blood pressure causes heart disease and increases the risk of stroke. Your blood pressure should be checked at least every 1-2 years. Ongoing high blood pressure should be treated with  medicines, if weight loss and exercise are not effective.  If you are 45-79 years old, ask your health care provider if you should take aspirin to prevent heart disease.  Diabetes screening involves taking a blood sample to check your fasting blood sugar level. This should be done once every 3 years, after age 45, if you are within normal weight and without risk factors for diabetes. Testing should be considered at a younger age or be carried out more frequently if you are overweight and have at least 1 risk factor for diabetes.  Colorectal cancer can be detected and often prevented. Most routine colorectal cancer screening begins at the age of 50 and continues through age 75. However, your health care provider may recommend screening at an earlier age if you have risk factors for colon cancer. On a yearly basis, your health care provider may provide home test kits to check for hidden blood in the stool. Use of a small camera at the end of a tube to directly examine the colon (sigmoidoscopy or colonoscopy) can detect the earliest forms of colorectal cancer. Talk to your health care provider about this at age 50, when routine screening begins. Direct exam of the colon should be repeated every 5-10 years through age 75, unless early forms of precancerous polyps or small growths are found.  People who are at an increased risk for hepatitis B should be screened for this virus. You are considered at high risk for hepatitis B if:  You were born in a country where hepatitis B occurs often. Talk with your health care provider about which countries are considered high risk.  Your parents were born in a high-risk country and you have not received a shot to protect against hepatitis B (hepatitis B vaccine).  You have HIV or AIDS.  You use needles to inject street drugs.  You live with, or have sex with, someone who has hepatitis B.  You are a man who has sex with other men (MSM).  You get hemodialysis  treatment.  You take certain medicines for conditions such as cancer, organ transplantation, and autoimmune conditions.  Hepatitis C blood testing is recommended for all people born from 1945 through 1965 and any individual with known risks for hepatitis C.  Practice safe sex. Use condoms and avoid high-risk sexual practices to reduce the spread of sexually transmitted infections (STIs). STIs include gonorrhea, chlamydia, syphilis, trichomonas, herpes, HPV, and human immunodeficiency virus (HIV). Herpes, HIV, and HPV are viral illnesses that have no cure. They can result in disability, cancer, and death.  If you are at risk of being infected with HIV, it is recommended that you take a prescription medicine daily to prevent HIV infection. This is called preexposure prophylaxis (PrEP). You are considered at risk if:  You are a man who has sex with other men (MSM) and have other risk factors.  You are a heterosexual man, are sexually active, and are at increased risk for HIV infection.    You take drugs by injection.  You are sexually active with a partner who has HIV.  Talk with your health care provider about whether you are at high risk of being infected with HIV. If you choose to begin PrEP, you should first be tested for HIV. You should then be tested every 3 months for as long as you are taking PrEP.  A one-time screening for abdominal aortic aneurysm (AAA) and surgical repair of large AAAs by ultrasound are recommended for men ages 32 to 67 years who are current or former smokers.  Healthy men should no longer receive prostate-specific antigen (PSA) blood tests as part of routine cancer screening. Talk with your health care provider about prostate cancer screening.  Testicular cancer screening is not recommended for adult males who have no symptoms. Screening includes self-exam, a health care provider exam, and other screening tests. Consult with your health care provider about any symptoms  you have or any concerns you have about testicular cancer.  Use sunscreen. Apply sunscreen liberally and repeatedly throughout the day. You should seek shade when your shadow is shorter than you. Protect yourself by wearing long sleeves, pants, a wide-brimmed hat, and sunglasses year round, whenever you are outdoors.  Once a month, do a whole-body skin exam, using a mirror to look at the skin on your back. Tell your health care provider about new moles, moles that have irregular borders, moles that are larger than a pencil eraser, or moles that have changed in shape or color.  Stay current with required vaccines (immunizations).  Influenza vaccine. All adults should be immunized every year.  Tetanus, diphtheria, and acellular pertussis (Td, Tdap) vaccine. An adult who has not previously received Tdap or who does not know his vaccine status should receive 1 dose of Tdap. This initial dose should be followed by tetanus and diphtheria toxoids (Td) booster doses every 10 years. Adults with an unknown or incomplete history of completing a 3-dose immunization series with Td-containing vaccines should begin or complete a primary immunization series including a Tdap dose. Adults should receive a Td booster every 10 years.  Varicella vaccine. An adult without evidence of immunity to varicella should receive 2 doses or a second dose if he has previously received 1 dose.  Human papillomavirus (HPV) vaccine. Males aged 68-21 years who have not received the vaccine previously should receive the 3-dose series. Males aged 22-26 years may be immunized. Immunization is recommended through the age of 6 years for any male who has sex with males and did not get any or all doses earlier. Immunization is recommended for any person with an immunocompromised condition through the age of 49 years if he did not get any or all doses earlier. During the 3-dose series, the second dose should be obtained 4-8 weeks after the first  dose. The third dose should be obtained 24 weeks after the first dose and 16 weeks after the second dose.  Zoster vaccine. One dose is recommended for adults aged 50 years or older unless certain conditions are present.  Measles, mumps, and rubella (MMR) vaccine. Adults born before [birth year] generally are considered immune to measles and mumps. Adults born in [birth year] or later should have 1 or more doses of MMR vaccine unless there is a contraindication to the vaccine or there is laboratory evidence of immunity to each of the three diseases. A routine second dose of MMR vaccine should be obtained at least 28 days after the first dose for students attending postsecondary  schools, health care workers, or international travelers. People who received inactivated measles vaccine or an unknown type of measles vaccine during 1963-1967 should receive 2 doses of MMR vaccine. People who received inactivated mumps vaccine or an unknown type of mumps vaccine before 1979 and are at high risk for mumps infection should consider immunization with 2 doses of MMR vaccine. Unvaccinated health care workers born before 1957 who lack laboratory evidence of measles, mumps, or rubella immunity or laboratory confirmation of disease should consider measles and mumps immunization with 2 doses of MMR vaccine or rubella immunization with 1 dose of MMR vaccine.  Pneumococcal 13-valent conjugate (PCV13) vaccine. When indicated, a person who is uncertain of his immunization history and has no record of immunization should receive the PCV13 vaccine. An adult aged 19 years or older who has certain medical conditions and has not been previously immunized should receive 1 dose of PCV13 vaccine. This PCV13 should be followed with a dose of pneumococcal polysaccharide (PPSV23) vaccine. The PPSV23 vaccine dose should be obtained at least 8 weeks after the dose of PCV13 vaccine. An adult aged 19 years or older who has certain medical conditions and  previously received 1 or more doses of PPSV23 vaccine should receive 1 dose of PCV13. The PCV13 vaccine dose should be obtained 1 or more years after the last PPSV23 vaccine dose.  Pneumococcal polysaccharide (PPSV23) vaccine. When PCV13 is also indicated, PCV13 should be obtained first. All adults aged 65 years and older should be immunized. An adult younger than age 65 years who has certain medical conditions should be immunized. Any person who resides in a nursing home or long-term care facility should be immunized. An adult smoker should be immunized. People with an immunocompromised condition and certain other conditions should receive both PCV13 and PPSV23 vaccines. People with human immunodeficiency virus (HIV) infection should be immunized as soon as possible after diagnosis. Immunization during chemotherapy or radiation therapy should be avoided. Routine use of PPSV23 vaccine is not recommended for American Indians, [place] Natives, or people younger than 65 years unless there are medical conditions that require PPSV23 vaccine. When indicated, people who have unknown immunization and have no record of immunization should receive PPSV23 vaccine. One-time revaccination 5 years after the first dose of PPSV23 is recommended for people aged 19-64 years who have chronic kidney failure, nephrotic syndrome, asplenia, or immunocompromised conditions. People who received 1-2 doses of PPSV23 before age 65 years should receive another dose of PPSV23 vaccine at age 65 years or later if at least 5 years have passed since the previous dose. Doses of PPSV23 are not needed for people immunized with PPSV23 at or after age 65 years.  Meningococcal vaccine. Adults with asplenia or persistent complement component deficiencies should receive 2 doses of quadrivalent meningococcal conjugate (MenACWY-D) vaccine. The doses should be obtained at least 2 months apart. Microbiologists working with certain meningococcal bacteria,  military recruits, people at risk during an outbreak, and people who travel to or live in countries with a high rate of meningitis should be immunized. A first-year college student up through age 21 years who is living in a residence hall should receive a dose if he did not receive a dose on or after his 16th birthday. Adults who have certain high-risk conditions should receive one or more doses of vaccine.  Hepatitis A vaccine. Adults who wish to be protected from this disease, have certain high-risk conditions, work with hepatitis A-infected animals, work in hepatitis A research labs, or   travel to or work in countries with a high rate of hepatitis A should be immunized. Adults who were previously unvaccinated and who anticipate close contact with an international adoptee during the first 60 days after arrival in the [place] from a country with a high rate of hepatitis A should be immunized.  Hepatitis B vaccine. Adults should be immunized if they wish to be protected from this disease, have certain high-risk conditions, may be exposed to blood or other infectious body fluids, are household contacts or sex partners of hepatitis B positive people, are clients or workers in certain care facilities, or travel to or work in countries with a high rate of hepatitis B.  Haemophilus influenzae type b (Hib) vaccine. A previously unvaccinated person with asplenia or sickle cell disease or having a scheduled splenectomy should receive 1 dose of Hib vaccine. Regardless of previous immunization, a recipient of a hematopoietic stem cell transplant should receive a 3-dose series 6-12 months after his successful transplant. Hib vaccine is not recommended for adults with HIV infection. Preventive Service / Frequency Ages 52 to 17  Blood pressure check.** / Every 1 to 2 years.  Lipid and cholesterol check.** / Every 5 years beginning at age 69.  Hepatitis C blood test.** / For any individual with known risks for  hepatitis C.  Skin self-exam. / Monthly.  Influenza vaccine. / Every year.  Tetanus, diphtheria, and acellular pertussis (Tdap, Td) vaccine.** / Consult your health care provider. 1 dose of Td every 10 years.  Varicella vaccine.** / Consult your health care provider.  HPV vaccine. / 3 doses over 6 months, if 72 or younger.  Measles, mumps, rubella (MMR) vaccine.** / You need at least 1 dose of MMR if you were born in 1957 or later. You may also need a second dose.  Pneumococcal 13-valent conjugate (PCV13) vaccine.** / Consult your health care provider.  Pneumococcal polysaccharide (PPSV23) vaccine.** / 1 to 2 doses if you smoke cigarettes or if you have certain conditions.  Meningococcal vaccine.** / 1 dose if you are age 35 to 60 years and a Market researcher living in a residence hall, or have one of several medical conditions. You may also need additional booster doses.  Hepatitis A vaccine.** / Consult your health care provider.  Hepatitis B vaccine.** / Consult your health care provider.  Haemophilus influenzae type b (Hib) vaccine.** / Consult your health care provider. Ages 35 to 8  Blood pressure check.** / Every 1 to 2 years.  Lipid and cholesterol check.** / Every 5 years beginning at age 57.  Lung cancer screening. / Every year if you are aged 44-80 years and have a 30-pack-year history of smoking and currently smoke or have quit within the past 15 years. Yearly screening is stopped once you have quit smoking for at least 15 years or develop a health problem that would prevent you from having lung cancer treatment.  Fecal occult blood test (FOBT) of stool. / Every year beginning at age 55 and continuing until age 73. You may not have to do this test if you get a colonoscopy every 10 years.  Flexible sigmoidoscopy** or colonoscopy.** / Every 5 years for a flexible sigmoidoscopy or every 10 years for a colonoscopy beginning at age 28 and continuing until age  1.  Hepatitis C blood test.** / For all people born from [birth year] through 1965 and any individual with known risks for hepatitis C.  Skin self-exam. / Monthly.  Influenza vaccine. / Every  year.  Tetanus, diphtheria, and acellular pertussis (Tdap/Td) vaccine.** / Consult your health care provider. 1 dose of Td every 10 years.  Varicella vaccine.** / Consult your health care provider.  Zoster vaccine.** / 1 dose for adults aged 53 years or older.  Measles, mumps, rubella (MMR) vaccine.** / You need at least 1 dose of MMR if you were born in 1957 or later. You may also need a second dose.  Pneumococcal 13-valent conjugate (PCV13) vaccine.** / Consult your health care provider.  Pneumococcal polysaccharide (PPSV23) vaccine.** / 1 to 2 doses if you smoke cigarettes or if you have certain conditions.  Meningococcal vaccine.** / Consult your health care provider.  Hepatitis A vaccine.** / Consult your health care provider.  Hepatitis B vaccine.** / Consult your health care provider.  Haemophilus influenzae type b (Hib) vaccine.** / Consult your health care provider. Ages 77 and over  Blood pressure check.** / Every 1 to 2 years.  Lipid and cholesterol check.**/ Every 5 years beginning at age 85.  Lung cancer screening. / Every year if you are aged 55-80 years and have a 30-pack-year history of smoking and currently smoke or have quit within the past 15 years. Yearly screening is stopped once you have quit smoking for at least 15 years or develop a health problem that would prevent you from having lung cancer treatment.  Fecal occult blood test (FOBT) of stool. / Every year beginning at age 33 and continuing until age 11. You may not have to do this test if you get a colonoscopy every 10 years.  Flexible sigmoidoscopy** or colonoscopy.** / Every 5 years for a flexible sigmoidoscopy or every 10 years for a colonoscopy beginning at age 28 and continuing until age 73.  Hepatitis C blood  test.** / For all people born from [birth year] through 1965 and any individual with known risks for hepatitis C.  Abdominal aortic aneurysm (AAA) screening.** / A one-time screening for ages 50 to 27 years who are current or former smokers.  Skin self-exam. / Monthly.  Influenza vaccine. / Every year.  Tetanus, diphtheria, and acellular pertussis (Tdap/Td) vaccine.** / 1 dose of Td every 10 years.  Varicella vaccine.** / Consult your health care provider.  Zoster vaccine.** / 1 dose for adults aged 34 years or older.  Pneumococcal 13-valent conjugate (PCV13) vaccine.** / Consult your health care provider.  Pneumococcal polysaccharide (PPSV23) vaccine.** / 1 dose for all adults aged 63 years and older.  Meningococcal vaccine.** / Consult your health care provider.  Hepatitis A vaccine.** / Consult your health care provider.  Hepatitis B vaccine.** / Consult your health care provider.  Haemophilus influenzae type b (Hib) vaccine.** / Consult your health care provider. **Family history and personal history of risk and conditions may change your health care provider's recommendations. Document Released: 01/12/2002 Document Revised: 11/21/2013 Document Reviewed: 04/13/2011 [facility] Patient Information 2015 [place], [place]. This information is not intended to replace advice given to you by your health care provider. Make sure you discuss any questions you have with your health care provider.

## 2014-06-04 NOTE — Progress Notes (Signed)
   Subjective:    Patient ID: Albert Allen, male    DOB: 11-30-1923, 80 y.o.   MRN: [id number]  HPI This 80 y.o. male presents for evaluation of CPE.  He has been having some lower abdominal Pain in his lower abdomen.  He is having some urinary frequency.  He has hx of bph.  He has been Anxiety.  His wife has hx of alzheimer disease. He has HS cramps that have improved with use Of sinemet.   Review of Systems C/o urinary discomfort   No chest pain, SOB, HA, dizziness, vision change, N/V, diarrhea, constipation, dysuria, urinary urgency or frequency, myalgias, arthralgias or rash.  Objective:   Physical Exam  Vital signs noted  Well developed well nourished male.  HEENT - Head atraumatic Normocephalic                Eyes - PERRLA, Conjuctiva - clear Sclera- Clear EOMI                Ears - EAC's Wnl TM's Wnl Gross Hearing WNL                Nose - Nares patent                 Throat - oropharanx wnl Respiratory - Lungs CTA bilateral Cardiac - RRR S1 and S2 without murmur GI - Abdomen soft Nontender and bowel sounds active x 4 Extremities - No edema. Neuro - Grossly intact.  KUB - Normal bowel gas pattern Prelimnary reading by Iverson Alamin    Assessment & Plan:  Essential hypertension, benign - Plan: POCT CBC, CMP14+EGFR, Lipid panel, TSH  Other and unspecified hyperlipidemia - Plan: POCT CBC, CMP14+EGFR, Lipid panel, TSH  BPH (benign prostatic hyperplasia) - Plan: nitrofurantoin, macrocrystal-monohydrate, (MACROBID) 100 MG capsule.  He cannot tolerate flomax and evidently had some orthostasis with this.  Suspect possible prostatitis - He c/o urine freq, nausea, and sx's that he had when he had some Prostatitis so will tx with macrobid.  He has allergies to levaquin   Anxiety state, unspecified - Plan: ALPRAZolam (XANAX) 0.25 MG tablet  Urine frequency - Plan: POCT UA - Microscopic Only, POCT urinalysis dipstick  Abdominal pain, unspecified site - Plan:  nitrofurantoin, macrocrystal-monohydrate, (MACROBID) 100 MG capsule, DG Abd 1 View  Unspecified hypothyroidism - Continue replacement levothyroxine.    Follow up in 3 months  Lysbeth Penner FNP

## 2014-06-05 ENCOUNTER — Other Ambulatory Visit: Payer: Self-pay | Admitting: Family Medicine

## 2014-06-05 DIAGNOSIS — D72819 Decreased white blood cell count, unspecified: Secondary | ICD-10-CM

## 2014-06-05 LAB — CMP14+EGFR
ALT: 8 IU/L (ref 0–44)
AST: 12 IU/L (ref 0–40)
Albumin/Globulin Ratio: 2.3 (ref 1.1–2.5)
Albumin: 4.5 g/dL (ref 3.5–4.7)
Alkaline Phosphatase: 78 IU/L (ref 39–117)
BUN/Creatinine Ratio: 15 (ref 10–22)
BUN: 13 mg/dL (ref 8–27)
CO2: 25 mmol/L (ref 18–29)
Calcium: 9.3 mg/dL (ref 8.6–10.2)
Chloride: 98 mmol/L (ref 97–108)
Creatinine, Ser: 0.87 mg/dL (ref 0.76–1.27)
GFR calc Af Amer: 88 mL/min/{1.73_m2} (ref >59)
GFR calc non Af Amer: 77 mL/min/{1.73_m2} (ref >59)
Globulin, Total: 2 g/dL (ref 1.5–4.5)
Glucose: 98 mg/dL (ref 65–99)
Potassium: 5 mmol/L (ref 3.5–5.2)
Sodium: 138 mmol/L (ref 134–144)
Total Bilirubin: 0.4 mg/dL (ref 0.0–1.2)
Total Protein: 6.5 g/dL (ref 6.0–8.5)

## 2014-06-05 LAB — LIPID PANEL
Chol/HDL Ratio: 4.3 ratio units (ref 0.0–5.0)
Cholesterol, Total: 191 mg/dL (ref 100–199)
HDL: 44 mg/dL (ref >39)
LDL Calculated: 112 mg/dL — ABNORMAL HIGH (ref 0–99)
Triglycerides: 175 mg/dL — ABNORMAL HIGH (ref 0–149)
VLDL Cholesterol Cal: 35 mg/dL (ref 5–40)

## 2014-06-05 LAB — TSH: TSH: 1.79 u[IU]/mL (ref 0.450–4.500)

## 2014-06-06 ENCOUNTER — Other Ambulatory Visit: Payer: Self-pay | Admitting: Family Medicine

## 2014-06-06 MED ORDER — ONDANSETRON 8 MG PO TBDP: 8.0000 mg | ORAL_TABLET | Freq: Three times a day (TID) | ORAL | Status: DC | PRN

## 2014-06-07 ENCOUNTER — Emergency Department (HOSPITAL_COMMUNITY)
Admission: EM | Admit: 2014-06-07 | Discharge: 2014-06-07 | Discharge status: Against Medical Advice | Payer: Medicare Other | Attending: Emergency Medicine | Admitting: Emergency Medicine

## 2014-06-07 ENCOUNTER — Encounter (HOSPITAL_COMMUNITY): Payer: Self-pay | Admitting: Emergency Medicine

## 2014-06-07 DIAGNOSIS — R110 Nausea: Secondary | ICD-10-CM | POA: Insufficient documentation

## 2014-06-07 LAB — COMPREHENSIVE METABOLIC PANEL
ALBUMIN: 4 g/dL (ref 3.5–5.2)
ALT: 8 U/L (ref 0–53)
AST: 11 U/L (ref 0–37)
Alkaline Phosphatase: 80 U/L (ref 39–117)
Anion gap: 12 (ref 5–15)
BUN: 12 mg/dL (ref 6–23)
CALCIUM: 9.2 mg/dL (ref 8.4–10.5)
CO2: 25 mEq/L (ref 19–32)
CREATININE: 1.02 mg/dL (ref 0.50–1.35)
Chloride: 99 mEq/L (ref 96–112)
GFR calc Af Amer: 73 mL/min — ABNORMAL LOW (ref >90)
GFR, EST NON AFRICAN AMERICAN: 63 mL/min — AB (ref >90)
Glucose, Bld: 101 mg/dL — ABNORMAL HIGH (ref 70–99)
Potassium: 4.8 mEq/L (ref 3.7–5.3)
Sodium: 136 mEq/L — ABNORMAL LOW (ref 137–147)
Total Bilirubin: 0.4 mg/dL (ref 0.3–1.2)
Total Protein: 7.1 g/dL (ref 6.0–8.3)

## 2014-06-07 LAB — CBC WITH DIFFERENTIAL/PLATELET
BASOS ABS: 0 10*3/uL (ref 0.0–0.1)
Basophils Relative: 0 % (ref 0–1)
Eosinophils Absolute: 0.1 10*3/uL (ref 0.0–0.7)
Eosinophils Relative: 2 % (ref 0–5)
HEMATOCRIT: 37.6 % — AB (ref 39.0–52.0)
Hemoglobin: 12.8 g/dL — ABNORMAL LOW (ref 13.0–17.0)
Lymphocytes Relative: 41 % (ref 12–46)
Lymphs Abs: 1.4 10*3/uL (ref 0.7–4.0)
MCH: 34.5 pg — ABNORMAL HIGH (ref 26.0–34.0)
MCHC: 34 g/dL (ref 30.0–36.0)
MCV: 101.3 fL — AB (ref 78.0–100.0)
Monocytes Absolute: 0.6 10*3/uL (ref 0.1–1.0)
Monocytes Relative: 17 % — ABNORMAL HIGH (ref 3–12)
Neutro Abs: 1.4 10*3/uL — ABNORMAL LOW (ref 1.7–7.7)
Neutrophils Relative %: 40 % — ABNORMAL LOW (ref 43–77)
Platelets: 169 10*3/uL (ref 150–400)
RBC: 3.71 MIL/uL — ABNORMAL LOW (ref 4.22–5.81)
RDW: 12.7 % (ref 11.5–15.5)
WBC: 3.4 10*3/uL — ABNORMAL LOW (ref 4.0–10.5)

## 2014-06-07 NOTE — ED Notes (Signed)
Pt. reports nausea q morning with generalized fatigue , pt. also stated low blood test results this week . Respirations unlabored , denies fever or chills. Ambulatory.

## 2014-06-08 ENCOUNTER — Emergency Department (HOSPITAL_COMMUNITY)
Admission: EM | Admit: 2014-06-08 | Discharge: 2014-06-08 | Disposition: A | Discharge status: Home / Self Care | Payer: Medicare Other | Attending: Emergency Medicine | Admitting: Emergency Medicine

## 2014-06-08 ENCOUNTER — Encounter (HOSPITAL_COMMUNITY): Payer: Self-pay | Admitting: Emergency Medicine

## 2014-06-08 DIAGNOSIS — Z79899 Other long term (current) drug therapy: Secondary | ICD-10-CM | POA: Insufficient documentation

## 2014-06-08 DIAGNOSIS — R110 Nausea: Secondary | ICD-10-CM | POA: Insufficient documentation

## 2014-06-08 DIAGNOSIS — I10 Essential (primary) hypertension: Secondary | ICD-10-CM | POA: Insufficient documentation

## 2014-06-08 DIAGNOSIS — I2510 Atherosclerotic heart disease of native coronary artery without angina pectoris: Secondary | ICD-10-CM | POA: Insufficient documentation

## 2014-06-08 DIAGNOSIS — Z792 Long term (current) use of antibiotics: Secondary | ICD-10-CM | POA: Insufficient documentation

## 2014-06-08 DIAGNOSIS — Z791 Long term (current) use of non-steroidal anti-inflammatories (NSAID): Secondary | ICD-10-CM | POA: Insufficient documentation

## 2014-06-08 DIAGNOSIS — Z880 Allergy status to penicillin: Secondary | ICD-10-CM | POA: Insufficient documentation

## 2014-06-08 DIAGNOSIS — Z8739 Personal history of other diseases of the musculoskeletal system and connective tissue: Secondary | ICD-10-CM | POA: Insufficient documentation

## 2014-06-08 DIAGNOSIS — E039 Hypothyroidism, unspecified: Secondary | ICD-10-CM | POA: Insufficient documentation

## 2014-06-08 DIAGNOSIS — F411 Generalized anxiety disorder: Secondary | ICD-10-CM | POA: Insufficient documentation

## 2014-06-08 DIAGNOSIS — Z87891 Personal history of nicotine dependence: Secondary | ICD-10-CM | POA: Insufficient documentation

## 2014-06-08 DIAGNOSIS — J45909 Unspecified asthma, uncomplicated: Secondary | ICD-10-CM | POA: Insufficient documentation

## 2014-06-08 DIAGNOSIS — M1990 Unspecified osteoarthritis, unspecified site: Secondary | ICD-10-CM | POA: Insufficient documentation

## 2014-06-08 DIAGNOSIS — K219 Gastro-esophageal reflux disease without esophagitis: Secondary | ICD-10-CM | POA: Insufficient documentation

## 2014-06-08 DIAGNOSIS — Z7982 Long term (current) use of aspirin: Secondary | ICD-10-CM | POA: Insufficient documentation

## 2014-06-08 DIAGNOSIS — Z862 Personal history of diseases of the blood and blood-forming organs and certain disorders involving the immune mechanism: Secondary | ICD-10-CM | POA: Insufficient documentation

## 2014-06-08 LAB — URINE MICROSCOPIC-ADD ON

## 2014-06-08 LAB — URINALYSIS, ROUTINE W REFLEX MICROSCOPIC
BILIRUBIN URINE: NEGATIVE
GLUCOSE, UA: NEGATIVE mg/dL
Ketones, ur: NEGATIVE mg/dL
Leukocytes, UA: NEGATIVE
Nitrite: NEGATIVE
PROTEIN: NEGATIVE mg/dL
Specific Gravity, Urine: 1.01 (ref 1.005–1.030)
UROBILINOGEN UA: 0.2 mg/dL (ref 0.0–1.0)
pH: 6 (ref 5.0–8.0)

## 2014-06-08 LAB — LIPASE, BLOOD: Lipase: 59 U/L (ref 11–59)

## 2014-06-08 MED ORDER — OXYCODONE-ACETAMINOPHEN 5-325 MG PO TABS
1.0000 | ORAL_TABLET | Freq: Once | ORAL | Status: AC
  Administered 2014-06-08: 1 via ORAL
  Filled 2014-06-08: qty 1

## 2014-06-08 NOTE — ED Notes (Signed)
Pt c/o loss of appetite and weight loss of 10 pounds over several years but feels like it is more rapid lately. Had colonoscopy and upper endoscopy 3 years ago. Pt takes 81 mg aspirin daily.

## 2014-06-08 NOTE — ED Notes (Signed)
Pt provided lunch per  Dr. Canary Brim

## 2014-06-08 NOTE — Discharge Instructions (Signed)
Return to the ED with any concerns including vomiting and not able to keep down liquids, fever/chills, chest pain, difficulty breathing, decreased level of alertness/lethargy, or any other aalrming symptoms

## 2014-06-08 NOTE — ED Provider Notes (Signed)
CSN: [id number]     Arrival date & time 06/08/14  0810 History   First MD Initiated Contact with Patient 06/08/14 1043     Chief Complaint  Patient presents with  . Abdominal Pain     (Consider location/radiation/quality/duration/timing/severity/associated sxs/prior Treatment) HPI PT presents with c/o nausea and abdominal pain.  He has no current complaints. He states he has symptoms each morning upon awakening for months.  He also states he was told to come to the hospital due to having WBC count 7.  Possibly RBC?  Pt is unsure but had bloodwork done in [place] this week due to having generalized weakness.  He denies syncope, no sob, no chest pain.  No blood in stool or melena.  There are no other associated systemic symptoms, there are no other alleviating or modifying factors.   Past Medical History  Diagnosis Date  . HYPOTHYROIDISM 05/31/2007  . HYPERLIPIDEMIA 03/27/2009  . GOUT 05/31/2007  . ANEMIA, B12 DEFICIENCY 03/29/2008  . Anxiety state, unspecified 11/19/2008  . ABUSE, ALCOHOL, IN REMISSION 05/31/2007  . CARPAL TUNNEL SYNDROME, RIGHT 09/05/2007  . Alcoholic polyneuropathy 7/82/9562  . HYPERTENSION 05/31/2007  . CAD, NATIVE VESSEL 04/04/2009  . LEFT BUNDLE BRANCH BLOCK 02/11/2009  . SINUSITIS, CHRONIC NOS 05/31/2007  . ASTHMA 05/31/2007  . GERD 05/31/2007  . Chronic prostatitis 03/29/2008  . LOC OSTEOARTHROS NOT SPEC PRIM/SEC LOWER LEG 03/20/2010  . OSTEOARTHRITIS 05/31/2007  . HIP PAIN 03/20/2010  . DEGENERATIVE DISC DISEASE, CERVICAL SPINE 06/22/2007  . Adhesive capsulitis of shoulder 12/29/2007  . SYNCOPE 11/19/2008  . WEIGHT LOSS, RECENT 11/19/2008  . DYSPHAGIA UNSPECIFIED 09/25/2008  . Hepatomegaly 09/25/2008  . PSA, INCREASED 03/20/2010  . ABNORMAL CV (STRESS) TEST 03/05/2009  . PERSONAL HX COLONIC POLYPS 03/05/1997    adenomatous   . Liver mass, left lobe   . Hiatal hernia   . Esophageal stricture    Past Surgical History  Procedure Laterality Date  . Pul. colapse w/chest tube     . Tonsillectomy    . Carpel tunnel release    . Orif ulnar fracture  11/03/2012    Procedure: OPEN REDUCTION INTERNAL FIXATION (ORIF) ULNAR FRACTURE;  Surgeon: Hessie Dibble, MD;  Location: [facility];  Service: Orthopedics;  Laterality: Right;  ORIF radius and ulnar fractures   Family History  Problem Relation Age of Onset  . Cancer Mother     In back but unsure of what type of cancer  . COPD Father   . Colon cancer Neg Hx    History  Substance Use Topics  . Smoking status: Former Smoker    Types: Cigarettes    Quit date: 12/27/2005  . Smokeless tobacco: Not on file  . Alcohol Use: No    Review of Systems ROS reviewed and all otherwise negative except for mentioned in HPI    Allergies  Amoxicillin; Ceclor; Celebrex; Levaquin; and Penicillins  Home Medications   Prior to Admission medications   Medication Sig Start Date End Date Taking? Authorizing Provider  albuterol (PROVENTIL HFA;VENTOLIN HFA) 108 (90 BASE) MCG/ACT inhaler Inhale 2 puffs into the lungs every 6 (six) hours as needed for wheezing or shortness of breath.   Yes Historical Provider, MD  ALPRAZolam (XANAX) 0.25 MG tablet Take 1 tablet (0.25 mg total) by mouth every 6 (six) hours as needed for sleep. 06/04/14  Yes Lysbeth Penner, FNP  ciprofloxacin (CIPRO) 500 MG tablet Take 1 tablet (500 mg total) by mouth 2 (two) times daily. 06/04/14  Yes  Lysbeth Penner, FNP  dicyclomine (BENTYL) 10 MG capsule Take 10 mg by mouth 4 (four) times daily -  before meals and at bedtime.   Yes Historical Provider, MD  levothyroxine (SYNTHROID, LEVOTHROID) 75 MCG tablet Take 75 mcg by mouth daily.     Yes Historical Provider, MD  losartan (COZAAR) 100 MG tablet Take 100 mg by mouth daily.   Yes Historical Provider, MD  metoprolol tartrate (LOPRESSOR) 25 MG tablet Take 1/2 tab twice a day 12/07/11  Yes Ricard Dillon, MD  omeprazole (PRILOSEC) 20 MG capsule Take 20 mg by mouth daily.   Yes Historical Provider, MD  ondansetron (ZOFRAN  ODT) 8 MG disintegrating tablet Take 1 tablet (8 mg total) by mouth every 8 (eight) hours as needed for nausea or vomiting. 06/06/14  Yes Lysbeth Penner, FNP  aspirin EC 81 MG tablet Take 81 mg by mouth daily.    Historical Provider, MD  diclofenac (VOLTAREN) 75 MG EC tablet Take 75 mg by mouth every evening. Prn    Historical Provider, MD  montelukast (SINGULAIR) 10 MG tablet Take 1 tablet (10 mg total) by mouth at bedtime. 03/14/14   Lysbeth Penner, FNP   BP 138/66  Pulse 78  Temp(Src) 98.1 F (36.7 C) (Oral)  Resp 20  Ht 5\' 8"  (1.727 m)  Wt 134 lb (60.782 kg)  BMI 20.38 kg/m2  SpO2 100% Vitals reviewed Physical Exam Physical Examination: General appearance - alert, well appearing, and in no distress Mental status - alert, oriented to person, place, and time Eyes - no conjunctival injection or pallor Mouth - mucous membranes moist, pharynx normal without lesions Chest - clear to auscultation, no wheezes, rales or rhonchi, symmetric air entry Heart - normal rate, regular rhythm, normal S1, S2, no murmurs, rubs, clicks or gallops Abdomen - soft, nontender, nondistended, no masses or organomegaly, nabs Extremities - peripheral pulses normal, no pedal edema, no clubbing or cyanosis Skin - normal coloration and turgor, no rashes  ED Course  Procedures (including critical care time) Labs Review Labs Reviewed  URINALYSIS, ROUTINE W REFLEX MICROSCOPIC - Abnormal; Notable for the following:    Hgb urine dipstick SMALL (*)    All other components within normal limits  LIPASE, BLOOD  URINE MICROSCOPIC-ADD ON    Imaging Review No results found.   EKG Interpretation   Date/Time:  Friday June 08 2014 12:23:00 EDT Ventricular Rate:  67 PR Interval:  309 QRS Duration: 76 QT Interval:  399 QTC Calculation: 421 R Axis:   70 Text Interpretation:  Sinus rhythm Prolonged PR interval Borderline T  abnormalities, anterior leads No significant change since last tracing  Confirmed by  [facility]  MD, Barret Esquivel [phone]) on 06/08/2014 2:11:19 PM      MDM   Final diagnoses:  Chronic nausea   Pt presenting with what he describes as morning nausea which is chronic and not present upon my evaluation.  Also came to the ED due to being told he had abnormal blood work- sounds like he was told his hemoglobin was 7 after having bloodwork performed for generalized weakness.  No significant lab abnormalities on todays workup.  hgb is near baseline and very midly anemic.  Chronically low WBC.  Pt is stable for outpatient followup which he already has scheduled.  Discharged with strict return precautions.  Pt agreeable with plan.   Threasa Beards, MD 06/10/14 1311

## 2014-06-08 NOTE — ED Notes (Addendum)
Pt monitored by 12 lead, blood pressure, and pulse ox. Provided pt with urinal to provide urine specimen

## 2014-06-08 NOTE — ED Notes (Signed)
Patient states he has abdominal pain every morning between 5a and 7a. Has seen urologist in April, Tresa Endo, MD told no obstruction.

## 2014-06-11 ENCOUNTER — Ambulatory Visit (INDEPENDENT_AMBULATORY_CARE_PROVIDER_SITE_OTHER): Payer: Medicare Other | Admitting: Family Medicine

## 2014-06-11 VITALS — BP 148/62 | HR 75 | Temp 96.7°F | Ht 68.0 in | Wt 133.8 lb

## 2014-06-11 DIAGNOSIS — R109 Unspecified abdominal pain: Secondary | ICD-10-CM

## 2014-06-11 DIAGNOSIS — R110 Nausea: Secondary | ICD-10-CM

## 2014-06-11 MED ORDER — ONDANSETRON 8 MG PO TBDP: 8.0000 mg | ORAL_TABLET | Freq: Three times a day (TID) | ORAL | Status: DC | PRN

## 2014-06-11 NOTE — Progress Notes (Signed)
   Subjective:    Patient ID: Albert Allen, male    DOB: [birth year], 80 y.o.   MRN: [id number]  HPI  This 80 y.o. male presents for evaluation of nausea and lower abdominal pain.  He has hx of prostatitis and has been tx'd for prostatitis with cipro for last week and has been having persistent nausea.  He states he has had this problem for the last 3 months.  He states his urine stream is better.  He  Was seen in the ED.  His labs show persistent leukopenia improved on recent cbc in ED.   Review of Systems C/o nausea   No chest pain, SOB, HA, dizziness, vision change, diarrhea, constipation, dysuria, urinary urgency or frequency, myalgias, arthralgias or rash.  Objective:   Physical Exam  Vital signs noted  Well developed well nourished male.  HEENT - Head atraumatic Normocephalic                Eyes - PERRLA, Conjuctiva - clear Sclera- Clear EOMI                Ears - EAC's Wnl TM's Wnl Gross Hearing WNL                Nose - Nares patent                 Throat - oropharanx wnl Respiratory - Lungs CTA bilateral Cardiac - RRR S1 and S2 without murmur GI - Abdomen soft tender across umbilical area bilateral,and bowel sounds active x 4 Extremities - No edema. Neuro - Grossly intact.      Assessment & Plan:  Chronic nausea - Plan: ondansetron (ZOFRAN ODT) 8 MG disintegrating tablet, Ambulatory referral to Gastroenterology  Abdominal pain, unspecified site - Plan: ondansetron (ZOFRAN ODT) 8 MG disintegrating tablet, Ambulatory referral to Gastroenterology  Prostatitis  - cipro 500mg  one po bid.  Discussed with patient that the cipro could also be causing this And he doesn't think so.  Follow up with urology.  Lysbeth Penner FNP

## 2014-06-14 ENCOUNTER — Telehealth: Payer: Self-pay | Admitting: Gastroenterology

## 2014-06-14 NOTE — Telephone Encounter (Signed)
I spoke with the patient and he c/o with chronic debilitating am nausea.  He has agreed to see Tye Savoy RNP 06/20/14 2:00.

## 2014-06-19 ENCOUNTER — Emergency Department (HOSPITAL_COMMUNITY): Payer: Medicare Other

## 2014-06-19 ENCOUNTER — Emergency Department (HOSPITAL_COMMUNITY)
Admission: EM | Admit: 2014-06-19 | Discharge: 2014-06-19 | Disposition: A | Discharge status: Home / Self Care | Payer: Medicare Other | Attending: Emergency Medicine | Admitting: Emergency Medicine

## 2014-06-19 ENCOUNTER — Encounter (HOSPITAL_COMMUNITY): Payer: Self-pay | Admitting: Emergency Medicine

## 2014-06-19 DIAGNOSIS — R110 Nausea: Secondary | ICD-10-CM

## 2014-06-19 DIAGNOSIS — Z87448 Personal history of other diseases of urinary system: Secondary | ICD-10-CM | POA: Insufficient documentation

## 2014-06-19 DIAGNOSIS — R109 Unspecified abdominal pain: Secondary | ICD-10-CM

## 2014-06-19 DIAGNOSIS — Z880 Allergy status to penicillin: Secondary | ICD-10-CM | POA: Insufficient documentation

## 2014-06-19 DIAGNOSIS — F411 Generalized anxiety disorder: Secondary | ICD-10-CM | POA: Insufficient documentation

## 2014-06-19 DIAGNOSIS — Z792 Long term (current) use of antibiotics: Secondary | ICD-10-CM | POA: Insufficient documentation

## 2014-06-19 DIAGNOSIS — I10 Essential (primary) hypertension: Secondary | ICD-10-CM | POA: Insufficient documentation

## 2014-06-19 DIAGNOSIS — Z79899 Other long term (current) drug therapy: Secondary | ICD-10-CM | POA: Insufficient documentation

## 2014-06-19 DIAGNOSIS — Z7982 Long term (current) use of aspirin: Secondary | ICD-10-CM | POA: Insufficient documentation

## 2014-06-19 DIAGNOSIS — M1990 Unspecified osteoarthritis, unspecified site: Secondary | ICD-10-CM | POA: Insufficient documentation

## 2014-06-19 DIAGNOSIS — Z791 Long term (current) use of non-steroidal anti-inflammatories (NSAID): Secondary | ICD-10-CM | POA: Insufficient documentation

## 2014-06-19 DIAGNOSIS — Z860100 Personal history of colon polyps, unspecified: Secondary | ICD-10-CM | POA: Insufficient documentation

## 2014-06-19 DIAGNOSIS — I2510 Atherosclerotic heart disease of native coronary artery without angina pectoris: Secondary | ICD-10-CM | POA: Insufficient documentation

## 2014-06-19 DIAGNOSIS — Z87891 Personal history of nicotine dependence: Secondary | ICD-10-CM | POA: Insufficient documentation

## 2014-06-19 DIAGNOSIS — J45909 Unspecified asthma, uncomplicated: Secondary | ICD-10-CM | POA: Insufficient documentation

## 2014-06-19 DIAGNOSIS — K219 Gastro-esophageal reflux disease without esophagitis: Secondary | ICD-10-CM | POA: Insufficient documentation

## 2014-06-19 DIAGNOSIS — E039 Hypothyroidism, unspecified: Secondary | ICD-10-CM | POA: Insufficient documentation

## 2014-06-19 LAB — CBC WITH DIFFERENTIAL/PLATELET
Basophils Absolute: 0 10*3/uL (ref 0.0–0.1)
Basophils Relative: 0 % (ref 0–1)
Eosinophils Absolute: 0 10*3/uL (ref 0.0–0.7)
Eosinophils Relative: 2 % (ref 0–5)
HCT: 39.9 % (ref 39.0–52.0)
HEMOGLOBIN: 13.8 g/dL (ref 13.0–17.0)
LYMPHS ABS: 1.1 10*3/uL (ref 0.7–4.0)
Lymphocytes Relative: 40 % (ref 12–46)
MCH: 34.2 pg — AB (ref 26.0–34.0)
MCHC: 34.6 g/dL (ref 30.0–36.0)
MCV: 98.8 fL (ref 78.0–100.0)
MONOS PCT: 17 % — AB (ref 3–12)
Monocytes Absolute: 0.4 10*3/uL (ref 0.1–1.0)
Neutro Abs: 1 10*3/uL — ABNORMAL LOW (ref 1.7–7.7)
Neutrophils Relative %: 41 % — ABNORMAL LOW (ref 43–77)
Platelets: 163 10*3/uL (ref 150–400)
RBC: 4.04 MIL/uL — AB (ref 4.22–5.81)
RDW: 12.4 % (ref 11.5–15.5)
WBC: 2.6 10*3/uL — AB (ref 4.0–10.5)

## 2014-06-19 LAB — COMPREHENSIVE METABOLIC PANEL
ALK PHOS: 82 U/L (ref 39–117)
ALT: 8 U/L (ref 0–53)
ANION GAP: 17 — AB (ref 5–15)
AST: 13 U/L (ref 0–37)
Albumin: 4.2 g/dL (ref 3.5–5.2)
BILIRUBIN TOTAL: 0.7 mg/dL (ref 0.3–1.2)
BUN: 8 mg/dL (ref 6–23)
CHLORIDE: 97 meq/L (ref 96–112)
CO2: 23 mEq/L (ref 19–32)
Calcium: 9.5 mg/dL (ref 8.4–10.5)
Creatinine, Ser: 0.9 mg/dL (ref 0.50–1.35)
GFR calc Af Amer: 85 mL/min — ABNORMAL LOW (ref >90)
GFR, EST NON AFRICAN AMERICAN: 73 mL/min — AB (ref >90)
GLUCOSE: 97 mg/dL (ref 70–99)
Potassium: 4.2 mEq/L (ref 3.7–5.3)
SODIUM: 137 meq/L (ref 137–147)
Total Protein: 7.3 g/dL (ref 6.0–8.3)

## 2014-06-19 LAB — URINE MICROSCOPIC-ADD ON

## 2014-06-19 LAB — URINALYSIS, ROUTINE W REFLEX MICROSCOPIC
BILIRUBIN URINE: NEGATIVE
GLUCOSE, UA: NEGATIVE mg/dL
KETONES UR: NEGATIVE mg/dL
Leukocytes, UA: NEGATIVE
Nitrite: NEGATIVE
PROTEIN: NEGATIVE mg/dL
Specific Gravity, Urine: 1.011 (ref 1.005–1.030)
Urobilinogen, UA: 0.2 mg/dL (ref 0.0–1.0)
pH: 8.5 — ABNORMAL HIGH (ref 5.0–8.0)

## 2014-06-19 LAB — LIPASE, BLOOD: Lipase: 55 U/L (ref 11–59)

## 2014-06-19 LAB — POC OCCULT BLOOD, ED: FECAL OCCULT BLD: NEGATIVE

## 2014-06-19 LAB — TROPONIN I

## 2014-06-19 MED ORDER — IOHEXOL 300 MG/ML  SOLN
25.0000 mL | Freq: Once | INTRAMUSCULAR | Status: AC | PRN
  Administered 2014-06-19: 25 mL via ORAL

## 2014-06-19 MED ORDER — SODIUM CHLORIDE 0.9 % IV BOLUS (SEPSIS)
500.0000 mL | Freq: Once | INTRAVENOUS | Status: AC
  Administered 2014-06-19: 500 mL via INTRAVENOUS

## 2014-06-19 MED ORDER — IOHEXOL 300 MG/ML  SOLN
100.0000 mL | Freq: Once | INTRAMUSCULAR | Status: AC | PRN
  Administered 2014-06-19: 100 mL via INTRAVENOUS

## 2014-06-19 NOTE — ED Provider Notes (Addendum)
Patient reports he has been having lower abdominal pain for at least the past 3 months. He states he has trouble urinating. He states it takes a while for his stream to start and then when it starts it's very weak and he feels like it's low volume. He states during the night he has to get up every 2-3 hours to urinate. He also has had nausea especially in the morning for the past 3 months. He states this morning he had his pain however today it radiated up into his epigastric area. He states it has never done that before. He states he last saw his urologist in April, Dr. Lawerance Bach, and he was diagnosed with benign bladder outlet obstruction. Patient states he was put on Cipro at that time. He states his primary care doctor also recently started him on Cipro.  Patient is well-developed well-nourished elderly man who is alert cooperative. He has minor distention of his abdomen. There are no palpable masses felt. He indicates his pain has been below his umbilicus until today.  Medical screening examination/treatment/procedure(s) were conducted as a shared visit with non-physician practitioner(s) and myself.  I personally evaluated the patient during the encounter.   EKG Interpretation   Date/Time:  Tuesday June 19 2014 07:53:19 EDT Ventricular Rate:  63 PR Interval:  244 QRS Duration: 78 QT Interval:  383 QTC Calculation: 392 R Axis:   72 Text Interpretation:  Sinus rhythm Prolonged PR interval borderline t  abnormalities, anterior leads No significant change since last tracing 08 Jun 2014 Confirmed by [place]  MD-I, Daelynn Blower ([id number]) on 06/19/2014 9:27:23 AM       Rolland Porter, MD, Alanson Aly, MD 06/19/14 Eveleth Marney Treloar, MD 06/20/14 [phone]

## 2014-06-19 NOTE — ED Notes (Signed)
Patient transported to Ultrasound 

## 2014-06-19 NOTE — ED Notes (Signed)
Pt. Finished drinking contrast. CT made aware.

## 2014-06-19 NOTE — ED Notes (Signed)
Per [facility], pt from home for abd pain. Has been seen by PCP for this and had tests ran which all come back normal. Pt states the pain doesn't bother him at night but starts in the mornings at  0530 and has nausea with it. Reports decreased appetite and a few lbs of weight loss. This has been ongoing for the past 3 months.

## 2014-06-19 NOTE — Discharge Instructions (Signed)
Please call your doctor for a followup appointment within 24-48 hours. When you talk to your doctor please let them know that you were seen in the emergency department and have them acquire all of your records so that they can discuss the findings with you and formulate a treatment plan to fully care for your new and ongoing problems. Please call and set-up an appointment with your primary care provider  Please call and set-up an appointment with your Urologist Please keep appointment with Gastroenterology tomorrow at 2:00PM Please continue to rest and stay hydrated Please drink plenty of water Please avoid foods high in fat, grease, and carbonated drinks Please increase fiber for constipation Please continue to monitor symptoms closely and if symptoms are to worsen or change (fever greater than 101, chills, chest pain, shortness of breath, difficulty breathing, nausea, vomiting, diarrhea, blood in stools, black for stools, inability to food or fluids down, worsening changes to abdominal pain, inability to urinate) please report back to the ED immediately  High-Fiber Diet Fiber is found in fruits, vegetables, and grains. A high-fiber diet encourages the addition of more whole grains, legumes, fruits, and vegetables in your diet. The recommended amount of fiber for adult males is 38 g per day. For adult females, it is 25 g per day. Pregnant and lactating women should get 28 g of fiber per day. If you have a digestive or bowel problem, ask your caregiver for advice before adding high-fiber foods to your diet. Eat a variety of high-fiber foods instead of only a select few type of foods.  PURPOSE  To increase stool bulk.  To make bowel movements more regular to prevent constipation.  To lower cholesterol.  To prevent overeating. WHEN IS THIS DIET USED?  It may be used if you have constipation and hemorrhoids.  It may be used if you have uncomplicated diverticulosis (intestine condition) and  irritable bowel syndrome.  It may be used if you need help with weight management.  It may be used if you want to add it to your diet as a protective measure against atherosclerosis, diabetes, and cancer. SOURCES OF FIBER  Whole-grain breads and cereals.  Fruits, such as apples, oranges, bananas, berries, prunes, and pears.  Vegetables, such as green peas, carrots, sweet potatoes, beets, broccoli, cabbage, spinach, and artichokes.  Legumes, such split peas, soy, lentils.  Almonds. FIBER CONTENT IN FOODS Starches and Grains / Dietary Fiber (g)  Cheerios, 1 cup / 3 g  Corn Flakes cereal, 1 cup / 0.7 g  Rice crispy treat cereal, 1 cup / 0.3 g  Instant oatmeal (cooked),  cup / 2 g  Frosted wheat cereal, 1 cup / 5.1 g  Brown, long-grain rice (cooked), 1 cup / 3.5 g  White, long-grain rice (cooked), 1 cup / 0.6 g  Enriched macaroni (cooked), 1 cup / 2.5 g Legumes / Dietary Fiber (g)  Baked beans (canned, plain, or vegetarian),  cup / 5.2 g  Kidney beans (canned),  cup / 6.8 g  Pinto beans (cooked),  cup / 5.5 g Breads and Crackers / Dietary Fiber (g)  Plain or honey graham crackers, 2 squares / 0.7 g  Saltine crackers, 3 squares / 0.3 g  Plain, salted pretzels, 10 pieces / 1.8 g  Whole-wheat bread, 1 slice / 1.9 g  White bread, 1 slice / 0.7 g  Raisin bread, 1 slice / 1.2 g  Plain bagel, 3 oz / 2 g  Flour tortilla, 1 oz / 0.9 g  Corn tortilla, 1 small / 1.5 g  Hamburger or hotdog bun, 1 small / 0.9 g Fruits / Dietary Fiber (g)  Apple with skin, 1 medium / 4.4 g  Sweetened applesauce,  cup / 1.5 g  Banana,  medium / 1.5 g  Grapes, 10 grapes / 0.4 g  Orange, 1 small / 2.3 g  Raisin, 1.5 oz / 1.6 g  Melon, 1 cup / 1.4 g Vegetables / Dietary Fiber (g)  Green beans (canned),  cup / 1.3 g  Carrots (cooked),  cup / 2.3 g  Broccoli (cooked),  cup / 2.8 g  Peas (cooked),  cup / 4.4 g  Mashed potatoes,  cup / 1.6 g  Lettuce, 1 cup /  0.5 g  Corn (canned),  cup / 1.6 g  Tomato,  cup / 1.1 g Document Released: 11/16/2005 Document Revised: 05/17/2012 Document Reviewed: 02/18/2012 [employer] Patient Information 2015 [place], [place]. This information is not intended to replace advice given to you by your health care provider. Make sure you discuss any questions you have with your health care provider. Food Choices for Gastroesophageal Reflux Disease When you have gastroesophageal reflux disease (GERD), the foods you eat and your eating habits are very important. Choosing the right foods can help ease the discomfort of GERD. WHAT GENERAL GUIDELINES DO I NEED TO FOLLOW?  Choose fruits, vegetables, whole grains, low-fat dairy products, and low-fat meat, fish, and poultry.  Limit fats such as oils, salad dressings, butter, nuts, and avocado.  Keep a food diary to identify foods that cause symptoms.  Avoid foods that cause reflux. These may be different for different people.  Eat frequent small meals instead of three large meals each day.  Eat your meals slowly, in a relaxed setting.  Limit fried foods.  Cook foods using methods other than frying.  Avoid drinking alcohol.  Avoid drinking large amounts of liquids with your meals.  Avoid bending over or lying down until 2-3 hours after eating. WHAT FOODS ARE NOT RECOMMENDED? The following are some foods and drinks that may worsen your symptoms: Vegetables Tomatoes. Tomato juice. Tomato and spaghetti sauce. Chili peppers. Onion and garlic. Horseradish. Fruits Oranges, grapefruit, and lemon (fruit and juice). Meats High-fat meats, fish, and poultry. This includes hot dogs, ribs, ham, sausage, salami, and bacon. Dairy Whole milk and chocolate milk. Sour cream. Cream. Butter. Ice cream. Cream cheese.  Beverages Coffee and tea, with or without caffeine. Carbonated beverages or energy drinks. Condiments Hot sauce. Barbecue sauce.  Sweets/Desserts Chocolate and cocoa.  Donuts. Peppermint and spearmint. Fats and Oils High-fat foods, including [place] fries and potato chips. Other Vinegar. Strong spices, such as black pepper, white pepper, red pepper, cayenne, curry powder, cloves, ginger, and chili powder. The items listed above may not be a complete list of foods and beverages to avoid. Contact your dietitian for more information. Document Released: 11/16/2005 Document Revised: 11/21/2013 Document Reviewed: 09/20/2013 [facility] Patient Information 2015 [place], [place]. This information is not intended to replace advice given to you by your health care provider. Make sure you discuss any questions you have with your health care provider. Constipation Constipation is when a person has fewer than three bowel movements a week, has difficulty having a bowel movement, or has stools that are dry, hard, or larger than normal. As people grow older, constipation is more common. If you try to fix constipation with medicines that make you have a bowel movement (laxatives), the problem may get worse. Long-term laxative use may cause  the muscles of the colon to become weak. A low-fiber diet, not taking in enough fluids, and taking certain medicines may make constipation worse.  CAUSES   Certain medicines, such as antidepressants, pain medicine, iron supplements, antacids, and water pills.   Certain diseases, such as diabetes, irritable bowel syndrome (IBS), thyroid disease, or depression.   Not drinking enough water.   Not eating enough fiber-rich foods.   Stress or travel.   Lack of physical activity or exercise.   Ignoring the urge to have a bowel movement.   Using laxatives too much.  SIGNS AND SYMPTOMS   Having fewer than three bowel movements a week.   Straining to have a bowel movement.   Having stools that are hard, dry, or larger than normal.   Feeling full or bloated.   Pain in the lower abdomen.   Not feeling relief after having a  bowel movement.  DIAGNOSIS  Your health care provider will take a medical history and perform a physical exam. Further testing may be done for severe constipation. Some tests may include:  A barium enema X-ray to examine your rectum, colon, and, sometimes, your small intestine.   A sigmoidoscopy to examine your lower colon.   A colonoscopy to examine your entire colon. TREATMENT  Treatment will depend on the severity of your constipation and what is causing it. Some dietary treatments include drinking more fluids and eating more fiber-rich foods. Lifestyle treatments may include regular exercise. If these diet and lifestyle recommendations do not help, your health care provider may recommend taking over-the-counter laxative medicines to help you have bowel movements. Prescription medicines may be prescribed if over-the-counter medicines do not work.  HOME CARE INSTRUCTIONS   Eat foods that have a lot of fiber, such as fruits, vegetables, whole grains, and beans.  Limit foods high in fat and processed sugars, such as french fries, hamburgers, cookies, candies, and soda.   A fiber supplement may be added to your diet if you cannot get enough fiber from foods.   Drink enough fluids to keep your urine clear or pale yellow.   Exercise regularly or as directed by your health care provider.   Go to the restroom when you have the urge to go. Do not hold it.   Only take over-the-counter or prescription medicines as directed by your health care provider. Do not take other medicines for constipation without talking to your health care provider first.  [place] IF:   You have bright red blood in your stool.   Your constipation lasts for more than 4 days or gets worse.   You have abdominal or rectal pain.   You have thin, pencil-like stools.   You have unexplained weight loss. MAKE SURE YOU:   Understand these instructions.  Will watch your  condition.  Will get help right away if you are not doing well or get worse. Document Released: 08/14/2004 Document Revised: 11/21/2013 Document Reviewed: 08/28/2013 [facility] Patient Information 2015 [place], [place]. This information is not intended to replace advice given to you by your health care provider. Make sure you discuss any questions you have with your health care provider. Abdominal Pain Many things can cause abdominal pain. Usually, abdominal pain is not caused by a disease and will improve without treatment. It can often be observed and treated at home. Your health care provider will do a physical exam and possibly order blood tests and X-rays to help determine the seriousness of your pain. However, in  many cases, more time must pass before a clear cause of the pain can be found. Before that point, your health care provider may not know if you need more testing or further treatment. HOME CARE INSTRUCTIONS  Monitor your abdominal pain for any changes. The following actions may help to alleviate any discomfort you are experiencing:  Only take over-the-counter or prescription medicines as directed by your health care provider.  Do not take laxatives unless directed to do so by your health care provider.  Try a clear liquid diet (broth, tea, or water) as directed by your health care provider. Slowly move to a bland diet as tolerated. SEEK MEDICAL CARE IF:  You have unexplained abdominal pain.  You have abdominal pain associated with nausea or diarrhea.  You have pain when you urinate or have a bowel movement.  You experience abdominal pain that wakes you in the night.  You have abdominal pain that is worsened or improved by eating food.  You have abdominal pain that is worsened with eating fatty foods.  You have a fever. SEEK IMMEDIATE MEDICAL CARE IF:   Your pain does not go away within 2 hours.  You keep throwing up (vomiting).  Your pain is felt only in portions of  the abdomen, such as the right side or the left lower portion of the abdomen.  You pass bloody or black tarry stools. MAKE SURE YOU:  Understand these instructions.   Will watch your condition.   Will get help right away if you are not doing well or get worse.  Document Released: 08/26/2005 Document Revised: 11/21/2013 Document Reviewed: 07/26/2013 [facility] Patient Information 2015 [place], [place]. This information is not intended to replace advice given to you by your health care provider. Make sure you discuss any questions you have with your health care provider.

## 2014-06-19 NOTE — ED Notes (Signed)
Family at bedside. 

## 2014-06-19 NOTE — ED Notes (Signed)
PA at the bedside.

## 2014-06-19 NOTE — ED Provider Notes (Signed)
CSN: [id number]     Arrival date & time 06/19/14  8502 History   First MD Initiated Contact with Patient 06/19/14 [phone]     Chief Complaint  Patient presents with  . Abdominal Pain  . Nausea     (Consider location/radiation/quality/duration/timing/severity/associated sxs/prior Treatment) The history is provided by the patient. No language interpreter was used.  Albert Allen is an 80 y/o M with PMHX of hypothyroidism, HLD, gout, B12 deficiency, alcohol abuse, HTN, CAD, LBBB, degenerative disc disease, dysphagia presenting to the ED with abdominal pain and nausea. As per patient, reported that when he wakes up early in the morning, reported that he has been having nausea for the past 2-3 months. Stated that he has been feeling nauseous with mild suprapubic pressure. As per patient, reported that he has been seen and assessed by his Urologist, Dr. Rosana Hoes who diagnosed patient with Benign Bladder Neck Obstruction - stated that he gets up at least every 2-3 hours per night secondary to having to use the bathroom, but reported that he has strained to use the bathroom. Patient reported that he has noticed mild black tarry stools over the past couple of days. Patient denied fever, chills, vomiting, diarrhea, hematochezia, hematemesis, chest pain, shortness of breath, difficulty breathing, dysuria, hematuria. PCP Dr. Laurance Flatten  Past Medical History  Diagnosis Date  . HYPOTHYROIDISM 05/31/2007  . HYPERLIPIDEMIA 03/27/2009  . GOUT 05/31/2007  . ANEMIA, B12 DEFICIENCY 03/29/2008  . Anxiety state, unspecified 11/19/2008  . ABUSE, ALCOHOL, IN REMISSION 05/31/2007  . CARPAL TUNNEL SYNDROME, RIGHT 09/05/2007  . Alcoholic polyneuropathy 2/87/8676  . HYPERTENSION 05/31/2007  . CAD, NATIVE VESSEL 04/04/2009  . LEFT BUNDLE BRANCH BLOCK 02/11/2009  . SINUSITIS, CHRONIC NOS 05/31/2007  . ASTHMA 05/31/2007  . GERD 05/31/2007  . Chronic prostatitis 03/29/2008  . LOC OSTEOARTHROS NOT SPEC PRIM/SEC LOWER LEG 03/20/2010  .  OSTEOARTHRITIS 05/31/2007  . HIP PAIN 03/20/2010  . DEGENERATIVE DISC DISEASE, CERVICAL SPINE 06/22/2007  . Adhesive capsulitis of shoulder 12/29/2007  . SYNCOPE 11/19/2008  . WEIGHT LOSS, RECENT 11/19/2008  . DYSPHAGIA UNSPECIFIED 09/25/2008  . Hepatomegaly 09/25/2008  . PSA, INCREASED 03/20/2010  . ABNORMAL CV (STRESS) TEST 03/05/2009  . PERSONAL HX COLONIC POLYPS 03/05/1997    adenomatous   . Liver mass, left lobe   . Hiatal hernia   . Esophageal stricture    Past Surgical History  Procedure Laterality Date  . Pul. colapse w/chest tube    . Tonsillectomy    . Carpel tunnel release    . Orif ulnar fracture  11/03/2012    Procedure: OPEN REDUCTION INTERNAL FIXATION (ORIF) ULNAR FRACTURE;  Surgeon: Hessie Dibble, MD;  Location: [facility];  Service: Orthopedics;  Laterality: Right;  ORIF radius and ulnar fractures   Family History  Problem Relation Age of Onset  . Cancer Mother     In back but unsure of what type of cancer  . COPD Father   . Colon cancer Neg Hx    History  Substance Use Topics  . Smoking status: Former Smoker    Types: Cigarettes    Quit date: 12/27/2005  . Smokeless tobacco: Not on file  . Alcohol Use: No    Review of Systems  Constitutional: Negative for fever and chills.  Respiratory: Negative for chest tightness and shortness of breath.   Cardiovascular: Negative for chest pain.  Gastrointestinal: Positive for nausea and abdominal pain. Negative for vomiting, diarrhea, constipation, blood in stool and anal bleeding.  Musculoskeletal: Negative for  back pain and neck pain.  Neurological: Negative for weakness.      Allergies  Amoxicillin; Ceclor; Celebrex; Levaquin; and Penicillins  Home Medications   Prior to Admission medications   Medication Sig Start Date End Date Taking? Authorizing Provider  albuterol (PROVENTIL HFA;VENTOLIN HFA) 108 (90 BASE) MCG/ACT inhaler Inhale 2 puffs into the lungs every 6 (six) hours as needed for wheezing or shortness of  breath.   Yes Historical Provider, MD  ALPRAZolam (XANAX) 0.25 MG tablet Take 1 tablet (0.25 mg total) by mouth every 6 (six) hours as needed for sleep. 06/04/14  Yes Lysbeth Penner, FNP  aspirin EC 81 MG tablet Take 81 mg by mouth daily.   Yes Historical Provider, MD  ciprofloxacin (CIPRO) 500 MG tablet Take 1 tablet (500 mg total) by mouth 2 (two) times daily. 06/04/14  Yes Lysbeth Penner, FNP  dicyclomine (BENTYL) 10 MG capsule Take 10 mg by mouth 4 (four) times daily -  before meals and at bedtime.   Yes Historical Provider, MD  levothyroxine (SYNTHROID, LEVOTHROID) 75 MCG tablet Take 75 mcg by mouth daily.     Yes Historical Provider, MD  losartan (COZAAR) 100 MG tablet Take 100 mg by mouth daily.   Yes Historical Provider, MD  metoprolol tartrate (LOPRESSOR) 25 MG tablet Take 1/2 tab twice a day 12/07/11  Yes Ricard Dillon, MD  omeprazole (PRILOSEC) 20 MG capsule Take 20 mg by mouth daily.   Yes Historical Provider, MD  ondansetron (ZOFRAN ODT) 8 MG disintegrating tablet Take 1 tablet (8 mg total) by mouth every 8 (eight) hours as needed for nausea or vomiting. 06/11/14  Yes Lysbeth Penner, FNP  diclofenac (VOLTAREN) 75 MG EC tablet Take 75 mg by mouth every evening. Prn    Historical Provider, MD  montelukast (SINGULAIR) 10 MG tablet Take 1 tablet (10 mg total) by mouth at bedtime. 03/14/14   Lysbeth Penner, FNP   BP 177/123  Pulse 78  Temp(Src) 97.4 F (36.3 C) (Oral)  Resp 19  Ht 5\' 8"  (1.727 m)  Wt 145 lb (65.772 kg)  BMI 22.05 kg/m2  SpO2 100% Physical Exam  Nursing note and vitals reviewed. Constitutional: He is oriented to person, place, and time. He appears well-developed and well-nourished. No distress.  HENT:  Head: Normocephalic and atraumatic.  Mouth/Throat: Oropharynx is clear and moist. No oropharyngeal exudate.  Eyes: Conjunctivae and EOM are normal. Pupils are equal, round, and reactive to light. Right eye exhibits no discharge. Left eye exhibits no discharge.   Neck: Normal range of motion. Neck supple. No tracheal deviation present.  Cardiovascular: Normal rate, regular rhythm and normal heart sounds.  Exam reveals no friction rub.   No murmur heard. Pulses:      Radial pulses are 2+ on the right side, and 2+ on the left side.       Dorsalis pedis pulses are 2+ on the right side, and 2+ on the left side.  Cap refill < 3 seconds Negative swelling or pitting edema noted to the lower extremities bilaterally   Pulmonary/Chest: Effort normal and breath sounds normal. No respiratory distress. He has no wheezes. He has no rales.  Patient is able to speak in full sentences without difficulty Negative use of accessory muscles Negative stridor  Abdominal: Soft. Bowel sounds are normal. He exhibits no distension. There is tenderness. There is no rebound and no guarding.  Negative abdominal distension  BS normoactive in all 4 quadrants Abdomen soft upon palpation  Discomfort upon palpation to the suprapubic and RUQ Negative peritoneal signs Negative guarding or rigidity noted   Genitourinary:  Rectal exam: Negative swelling, erythema, inflammation, lesions, sores, deformities, external hemorrhoids identified to the anus. Negative palpation of masses or lesions noted to the rectum. Strong sphincter tone. Negative bright red blood on glove. Brown stools noted on glove. Exam chaperoned with tech.  Musculoskeletal: Normal range of motion.  Full ROM to upper and lower extremities without difficulty noted, negative ataxia noted.  Lymphadenopathy:    He has no cervical adenopathy.  Neurological: He is alert and oriented to person, place, and time. No cranial nerve deficit. He exhibits normal muscle tone. Coordination normal.  Cranial nerves III-XII grossly intact Strength 5+/5+ to upper and lower extremities bilaterally with resistance applied, equal distribution noted Equal grip strength  Negative facial drooping Negative slurred speech  Negative aphasia   Skin: Skin is warm and dry. No rash noted. He is not diaphoretic. No erythema.  Psychiatric: He has a normal mood and affect. His behavior is normal. Thought content normal.    ED Course  Procedures (including critical care time)  Results for orders placed during the hospital encounter of 06/19/14  CBC WITH DIFFERENTIAL      Result Value Ref Range   WBC 2.6 (*) 4.0 - 10.5 K/uL   RBC 4.04 (*) 4.22 - 5.81 MIL/uL   Hemoglobin 13.8  13.0 - 17.0 g/dL   HCT 39.9  39.0 - 52.0 %   MCV 98.8  78.0 - 100.0 fL   MCH 34.2 (*) 26.0 - 34.0 pg   MCHC 34.6  30.0 - 36.0 g/dL   RDW 12.4  11.5 - 15.5 %   Platelets 163  150 - 400 K/uL   Neutrophils Relative % 41 (*) 43 - 77 %   Neutro Abs 1.0 (*) 1.7 - 7.7 K/uL   Lymphocytes Relative 40  12 - 46 %   Lymphs Abs 1.1  0.7 - 4.0 K/uL   Monocytes Relative 17 (*) 3 - 12 %   Monocytes Absolute 0.4  0.1 - 1.0 K/uL   Eosinophils Relative 2  0 - 5 %   Eosinophils Absolute 0.0  0.0 - 0.7 K/uL   Basophils Relative 0  0 - 1 %   Basophils Absolute 0.0  0.0 - 0.1 K/uL  COMPREHENSIVE METABOLIC PANEL      Result Value Ref Range   Sodium 137  137 - 147 mEq/L   Potassium 4.2  3.7 - 5.3 mEq/L   Chloride 97  96 - 112 mEq/L   CO2 23  19 - 32 mEq/L   Glucose, Bld 97  70 - 99 mg/dL   BUN 8  6 - 23 mg/dL   Creatinine, Ser 0.90  0.50 - 1.35 mg/dL   Calcium 9.5  8.4 - 10.5 mg/dL   Total Protein 7.3  6.0 - 8.3 g/dL   Albumin 4.2  3.5 - 5.2 g/dL   AST 13  0 - 37 U/L   ALT 8  0 - 53 U/L   Alkaline Phosphatase 82  39 - 117 U/L   Total Bilirubin 0.7  0.3 - 1.2 mg/dL   GFR calc non Af Amer 73 (*) >90 mL/min   GFR calc Af Amer 85 (*) >90 mL/min   Anion gap 17 (*) 5 - 15  LIPASE, BLOOD      Result Value Ref Range   Lipase 55  11 - 59 U/L  URINALYSIS, ROUTINE W REFLEX MICROSCOPIC  Result Value Ref Range   Color, Urine YELLOW  YELLOW   APPearance CLEAR  CLEAR   Specific Gravity, Urine 1.011  1.005 - 1.030   pH 8.5 (*) 5.0 - 8.0   Glucose, UA NEGATIVE  NEGATIVE  mg/dL   Hgb urine dipstick SMALL (*) NEGATIVE   Bilirubin Urine NEGATIVE  NEGATIVE   Ketones, ur NEGATIVE  NEGATIVE mg/dL   Protein, ur NEGATIVE  NEGATIVE mg/dL   Urobilinogen, UA 0.2  0.0 - 1.0 mg/dL   Nitrite NEGATIVE  NEGATIVE   Leukocytes, UA NEGATIVE  NEGATIVE  TROPONIN I      Result Value Ref Range   Troponin I <0.30  <0.30 ng/mL  URINE MICROSCOPIC-ADD ON      Result Value Ref Range   WBC, UA 0-2  <3 WBC/hpf   RBC / HPF 0-2  <3 RBC/hpf  POC OCCULT BLOOD, ED      Result Value Ref Range   Fecal Occult Bld NEGATIVE  NEGATIVE    Labs Review Labs Reviewed  CBC WITH DIFFERENTIAL - Abnormal; Notable for the following:    WBC 2.6 (*)    RBC 4.04 (*)    MCH 34.2 (*)    Neutrophils Relative % 41 (*)    Neutro Abs 1.0 (*)    Monocytes Relative 17 (*)    All other components within normal limits  COMPREHENSIVE METABOLIC PANEL - Abnormal; Notable for the following:    GFR calc non Af Amer 73 (*)    GFR calc Af Amer 85 (*)    Anion gap 17 (*)    All other components within normal limits  URINALYSIS, ROUTINE W REFLEX MICROSCOPIC - Abnormal; Notable for the following:    pH 8.5 (*)    Hgb urine dipstick SMALL (*)    All other components within normal limits  LIPASE, BLOOD  TROPONIN I  URINE MICROSCOPIC-ADD ON  OCCULT BLOOD X 1 CARD TO LAB, STOOL  POC OCCULT BLOOD, ED    Imaging Review Dg Chest 2 View  06/19/2014   CLINICAL DATA:  Weakness and abdominal pain and nausea  EXAM: CHEST  2 VIEW  COMPARISON:  PA and lateral chest of May 15, 2013  FINDINGS: The lungs remain hyperinflated. There are coarse right infrahilar lung markings not significantly changed from the previous study. There is no pleural effusion. The heart and pulmonary vascularity are normal. There is no free subdiaphragmatic gas collection. There is stable mild anterior wedging of approximately T8.  IMPRESSION: COPD. There is no pneumonia nor CHF nor other acute cardiopulmonary abnormality.   Electronically Signed    By: David  Martinique   On: 06/19/2014 10:15   US Abdomen Complete  06/19/2014   CLINICAL DATA:  Right upper quadrant pain  EXAM: ULTRASOUND ABDOMEN COMPLETE  COMPARISON:  CT abdomen 04/21/2005 ; abdominal ultrasound 09/28/2008.  FINDINGS: Gallbladder:  No gallstones or wall thickening visualized. No sonographic Murphy sign noted.  Common bile duct:  Diameter: 6 mm  Liver:  There are 2 hyperechoic left hepatic masses measuring 2.1 x 2.4 x 2.3 cm and 2.1 x 2.3 x 2.1 cm respectively most consistent with a hemangioma are unchanged in size compared with prior CT of the abdomen dated 04/21/2005. Within normal limits in parenchymal echogenicity. There is no abdominal free fluid.  IVC:  No abnormality visualized.  Pancreas:  Visualized portion unremarkable.  Spleen:  Size and appearance within normal limits.  Right Kidney:  Length: 10 cm. Echogenicity within normal limits. No mass  or hydronephrosis visualized.  Left Kidney:  Length: 10.2 cm. Echogenicity within normal limits. No mass or hydronephrosis visualized.  Abdominal aorta:  No aneurysm visualized.  Other findings:  None.  IMPRESSION: 1. No cholelithiasis or sonographic evidence of acute cholecystitis.  2. No obstructive uropathy.  3. No abdominal free fluid.   Electronically Signed   By: Kathreen Devoid   On: 06/19/2014 10:36   Ct Abdomen Pelvis W Contrast  06/19/2014   CLINICAL DATA:  Diffuse abdominal pain, weight loss  EXAM: CT ABDOMEN AND PELVIS WITH CONTRAST  TECHNIQUE: Multidetector CT imaging of the abdomen and pelvis was performed using the standard protocol following bolus administration of intravenous contrast.  CONTRAST:  132mL OMNIPAQUE IOHEXOL 300 MG/ML  SOLN  COMPARISON:  None.  FINDINGS: Sagittal images of the spine shows diffuse osteopenia. Disc space flattening with mild posterior spurring and vacuum disc phenomenon noted at L3-L4 level. Significant disc space flattening with endplate sclerotic changes and vacuum disc phenomenon at L5-S1 level.  Mild compression deformity upper endplate of L1 vertebral body of indeterminate age. Emphysematous changes/bulla noted right base anteriorly.  There is moderate size hiatal hernia. A hemangioma in left hepatic dome measures 3.3 cm. Second hemangioma in lateral left hepatic lobe measures 3 cm. No calcified gallstones are noted within gallbladder.  Atherosclerotic calcifications and atherosclerotic plaques noted abdominal aorta and iliac arteries. A posterior wall somewhat exophytic atherosclerotic plaque noted in distal abdominal aorta axial image 34 and sagittal image 57. Measures about 1 cm. No evidence of aortic leak or obstruction.  There is a distal duodenal diverticulum just anterior to abdominal aorta measures about 2.4 cm.  The pancreas, spleen and adrenal glands are unremarkable. Kidneys are symmetrical in size and enhancement. No hydronephrosis or hydroureter.  Delayed renal images shows bilateral renal symmetrical excretion.  No small bowel obstruction.  No ascites or free air.  No adenopathy.  There are diverticula in right colon. Minimal thickening of colonic wall in the region of colonic diverticula probable due to colonic diverticulosis. No definite evidence of acute diverticulitis. A normal appendix is clearly visualized in axial image 46. The transverse colon was is empty partially collapsed. No colonic obstruction. Scattered diverticula are noted descending colon. No evidence of acute diverticulitis. There is some gas noted in sigmoid colon. Moderate stool noted within rectum. The rectum measures 5.9 cm in diameter suspicious for mild fecal impaction. Moderate distended urinary bladder. No bladder filling defects. Prostate gland measures 5.1 x 3.3 cm. Bilateral inguinal scrotal canal small hernia containing fat without evidence of acute complication. No destructive bony lesions are noted within pelvis.  IMPRESSION: 1. Moderate size hiatal hernia is noted. 2. Hemangiomas noted in left hepatic lobe  the largest measures 3.3 cm. 3. No hydronephrosis or hydroureter. 4. Extensive atherosclerotic plaques and calcifications of abdominal aorta and iliac arteries. Somewhat exophytic posterior wall atherosclerotic plaque distal abdominal aorta measures about 1 cm. No evidence of leak or aortic obstruction. 5. Normal appendix. 6. Right colon diverticula are noted. Mild thickening of right colonic wall probable due to chronic diverticulosis. No definite evidence of acute diverticulitis. 7. Moderate stool noted within rectum. Mild fecal impaction cannot be excluded. 8. Mild enlarged prostate gland. 9. Moderate distended urinary bladder. 10. A distal duodenal diverticulum measures about 2.4 Cm without evidence of acute complication.   Electronically Signed   By: Lahoma Crocker M.D.   On: 06/19/2014 13:42     EKG Interpretation   Date/Time:  Tuesday June 19 2014 07:53:19 EDT Ventricular  Rate:  63 PR Interval:  244 QRS Duration: 78 QT Interval:  383 QTC Calculation: 392 R Axis:   72 Text Interpretation:  Sinus rhythm Prolonged PR interval borderline t  abnormalities, anterior leads No significant change since last tracing 08 Jun 2014 Confirmed by KNAPP  MD-I, IVA ([id number]) on 06/19/2014 9:27:23 AM      MDM   Final diagnoses:  Abdominal pain, unspecified abdominal location  Chronic nausea    Medications  sodium chloride 0.9 % bolus 500 mL (0 mLs Intravenous Stopped 06/19/14 1210)  iohexol (OMNIPAQUE) 300 MG/ML solution 25 mL (25 mLs Oral Contrast Given 06/19/14 1140)  iohexol (OMNIPAQUE) 300 MG/ML solution 100 mL (100 mLs Intravenous Contrast Given 06/19/14 1245)   Filed Vitals:   06/19/14 1200 06/19/14 1300 06/19/14 1346 06/19/14 1400  BP: 185/91 198/89 162/69 177/123  Pulse: 68 62 62 78  Temp:      TempSrc:      Resp: 18 13 14 19   Height:      Weight:      SpO2: 97% 100% 100% 100%   EKG noted normal sinus rhythm with a prolonged PR interval borderline T abnormalities-no change since last  tracing with a heart rate of 63 beats per minute. Troponin negative elevation. White blood cell count 2.6. Hemoglobin 13.8, hematocrit 39.9. CMP electrolytes properly balanced. BUN 8, creatinine 0.90. AST, ALT, alkaline phosphatase negative elevation. Negative elevated bilirubin. Lipase negative elevation.Fecal occult negative. Urinalysis noted small hemoglobin with negative nitrites, leukocytes-negative findings of infection. Abdominal ultrasound identified a left hepatic mass measuring 2.1 x 2.4 x 2.3 cm and another mass measuring 2.1 x 2.3 x 2.1 cm respectively that is consistent with hemangioma and unchanged since previous CT performed in 2006. Negative cholelithiasis or sonographic evidence of cholecystitis. No obstructive uropathy. No abdominal free fluid. Chest x-ray noted COPD-no pneumonia or CHF identified-no acute cardiopulmonary disease noted. CT abdomen pelvis with contrast noted moderate size hiatal hernia. Hemangiomas in the left hepatic lobe greatest measuring 3.3 cm again identified. Negative hydronephrosis or hydroureters. Extensive atherosclerotic plaques and calcification of the abdominal aorta and iliac arteries are noted-somewhat exophytic posterior wall atherosclerotic plaque distal abdominal aorta measures proximally 1 cm with no evidence of leak were aortic obstruction. The appendix appears normal. Right colon diverticula identified no definite evidence of acute diverticulitis. Moderate stool noted within the rectum. Mild enlarged prostate. Moderate distended urinary bladder. Distal duodenal diverticulum measures approximately 2.4 cm without evidence of acute complication. Doubt colitis. Doubt appendicitis. Doubt pancreatitis. Doubt cholecystitis. Doubt cholangitis. Doubt acute abdominal processes. CT identified urinary bladder distension and constipation. Patient presenting to the ED with nausea that occurs in the morning, has been ongoing for the past 3 months - suspicion to be possible  hiatal hernia. Patient has been being followed by Urology, as well as has an appointment with GI tomorrow at 2:00PM. Patient currently is taking Bentyl at home and Zofran with relief of discomfort. Patient seen and assessed by attending physician, Dr. Eliane Decree - labs reviewed and attending agrees to plan of discharge. Patient stable, afebrile. Patient not septic appearing. Patient tolerated by mouth fluids without difficulty-no episodes of emesis while in ED setting. Discharged patient. Referred patient to primary care provider, GI, urology. Discussed with patient proper diet. Discussed with patient to keep appointment with gastroenterology tomorrow at 2:00 PM. Discussed with patient to closely monitor symptoms and if symptoms are to worsen or change to report back to the ED - strict return instructions given.  Patient agreed  to plan of care, understood, all questions answered.   Jamse Mead, PA-C 06/19/14 1818

## 2014-06-19 NOTE — ED Notes (Addendum)
In xray

## 2014-06-19 NOTE — ED Notes (Signed)
Dr. Knapp at the bedside.  

## 2014-06-20 ENCOUNTER — Ambulatory Visit (INDEPENDENT_AMBULATORY_CARE_PROVIDER_SITE_OTHER): Payer: Medicare Other | Admitting: Nurse Practitioner

## 2014-06-20 ENCOUNTER — Ambulatory Visit (HOSPITAL_COMMUNITY): Payer: Medicare Other

## 2014-06-20 ENCOUNTER — Encounter: Payer: Self-pay | Admitting: Nurse Practitioner

## 2014-06-20 VITALS — BP 130/70 | HR 74 | Ht 67.25 in | Wt 134.0 lb

## 2014-06-20 DIAGNOSIS — R110 Nausea: Secondary | ICD-10-CM | POA: Insufficient documentation

## 2014-06-20 DIAGNOSIS — R634 Abnormal weight loss: Secondary | ICD-10-CM

## 2014-06-20 NOTE — Progress Notes (Addendum)
HPI :  Patient is an 80 year old male known to Dr. Sharlett Iles for history of GERD, peptic stricture and colon polyps. Patient was last seen at time of colonoscopy 2012 which revealed diverticulosis   Patient is here today for evaluation of nausea. The nausea started about 3 months ago. Initially it was just in the morning, now lasting for most of the day. Eating does tend to help the nausea thoug patient reports a 7 pound weight loss in the last 3 months. He has not started any new medications. The only evening medication he takes his metoprolol and he's been on for 5 years. He has been on a daily PPI for years. Voltaren on home med list but he has not had any in over 1 month. He is on chronic daily aspirin therapy but has held that over the last 3 days. Patient has had two unremarkable ED evaluations in the last several days.  Labs 06/19/14 pertinent for white count of only 2.6.  Comprehensive metabolic profile and lipase both normal. Ultrasound revealed two hemangiomas. CT scan of the abdomen and pelvis with contrast revealed a moderate size hiatal hernia, two hemangiomas, a distal duodenal diverticulum, minimal thickening of the right colon, possibly secondary to diverticulosis. There was a question of a fecal impaction. No bowel problems, he takes prune juice as needed for constipation. His only other concern is that of urinary hesitancy. He is followed by urology.  Past Medical History  Diagnosis Date  . HYPOTHYROIDISM 05/31/2007  . HYPERLIPIDEMIA 03/27/2009  . GOUT 05/31/2007  . ANEMIA, B12 DEFICIENCY 03/29/2008  . Anxiety state, unspecified 11/19/2008  . ABUSE, ALCOHOL, IN REMISSION 05/31/2007  . CARPAL TUNNEL SYNDROME, RIGHT 09/05/2007  . Alcoholic polyneuropathy 6/78/9381  . HYPERTENSION 05/31/2007  . CAD, NATIVE VESSEL 04/04/2009  . LEFT BUNDLE BRANCH BLOCK 02/11/2009  . SINUSITIS, CHRONIC NOS 05/31/2007  . ASTHMA 05/31/2007  . GERD 05/31/2007  . Chronic prostatitis 03/29/2008  . LOC OSTEOARTHROS NOT  SPEC PRIM/SEC LOWER LEG 03/20/2010  . OSTEOARTHRITIS 05/31/2007  . HIP PAIN 03/20/2010  . DEGENERATIVE DISC DISEASE, CERVICAL SPINE 06/22/2007  . Adhesive capsulitis of shoulder 12/29/2007  . WEIGHT LOSS, RECENT 11/19/2008  . Hepatomegaly 09/25/2008  . PSA, INCREASED 03/20/2010  . ABNORMAL CV (STRESS) TEST 03/05/2009  . PERSONAL HX COLONIC POLYPS 03/05/1997    adenomatous   . Hiatal hernia   . Esophageal stricture    Family History  Problem Relation Age of Onset  . Cancer Mother     In back but unsure of what type of cancer  . COPD Father   . Colon cancer Neg Hx    History  Substance Use Topics  . Smoking status: Former Smoker    Types: Cigarettes    Quit date: 12/27/2005  . Smokeless tobacco: Not on file  . Alcohol Use: No   Current Outpatient Prescriptions  Medication Sig Dispense Refill  . albuterol (PROVENTIL HFA;VENTOLIN HFA) 108 (90 BASE) MCG/ACT inhaler Inhale 2 puffs into the lungs every 6 (six) hours as needed for wheezing or shortness of breath.      . ALPRAZolam (XANAX) 0.25 MG tablet Take 1 tablet (0.25 mg total) by mouth every 6 (six) hours as needed for sleep.  60 tablet  3  . aspirin EC 81 MG tablet Take 81 mg by mouth daily.      . diclofenac (VOLTAREN) 75 MG EC tablet Take 75 mg by mouth every evening. Prn      . dicyclomine (BENTYL) 10 MG  capsule Take 10 mg by mouth 4 (four) times daily -  before meals and at bedtime.      Marland Kitchen levothyroxine (SYNTHROID, LEVOTHROID) 75 MCG tablet Take 75 mcg by mouth daily.        Marland Kitchen losartan (COZAAR) 100 MG tablet Take 100 mg by mouth daily.      . metoprolol tartrate (LOPRESSOR) 25 MG tablet Take 1/2 tab twice a day      . omeprazole (PRILOSEC) 20 MG capsule Take 20 mg by mouth daily.      . ondansetron (ZOFRAN ODT) 8 MG disintegrating tablet Take 1 tablet (8 mg total) by mouth every 8 (eight) hours as needed for nausea or vomiting.  30 tablet  2   No current facility-administered medications for this visit.   Allergies  Allergen  Reactions  . Amoxicillin Other (See Comments)    REACTION: unspecified  . Ceclor [Cefaclor] Other (See Comments)    Reaction: unspecified  . Celebrex [Celecoxib] Other (See Comments)    Reaction: Unspecified  . Levaquin [Levofloxacin In D5w] Other (See Comments)    Pt does not remember  . Penicillins Swelling    LIPS   Review of Systems: All systems reviewed and negative except where noted in HPI.   Dg Abd 1 View  06/04/2014   CLINICAL DATA:  Generalized abdominal pain  EXAM: ABDOMEN - 1 VIEW  COMPARISON:  None.  FINDINGS: There small amounts of gas within small bowel loops in the left mid abdomen. There is gas and stool in the rectum. There are no abnormal soft tissue calcifications. There are degenerative changes of the SI joints and both hips.  IMPRESSION: The bowel gas pattern is nonspecific. There is no evidence of ileus or obstruction.   Electronically Signed   By: David  Martinique   On: 06/04/2014 11:12   US Abdomen Complete  06/19/2014   CLINICAL DATA:  Right upper quadrant pain  EXAM: ULTRASOUND ABDOMEN COMPLETE  COMPARISON:  CT abdomen 04/21/2005 ; abdominal ultrasound 09/28/2008.  FINDINGS: Gallbladder:  No gallstones or wall thickening visualized. No sonographic Murphy sign noted.  Common bile duct:  Diameter: 6 mm  Liver:  There are 2 hyperechoic left hepatic masses measuring 2.1 x 2.4 x 2.3 cm and 2.1 x 2.3 x 2.1 cm respectively most consistent with a hemangioma are unchanged in size compared with prior CT of the abdomen dated 04/21/2005. Within normal limits in parenchymal echogenicity. There is no abdominal free fluid.  IVC:  No abnormality visualized.  Pancreas:  Visualized portion unremarkable.  Spleen:  Size and appearance within normal limits.  Right Kidney:  Length: 10 cm. Echogenicity within normal limits. No mass or hydronephrosis visualized.  Left Kidney:  Length: 10.2 cm. Echogenicity within normal limits. No mass or hydronephrosis visualized.  Abdominal aorta:  No aneurysm  visualized.  Other findings:  None.  IMPRESSION: 1. No cholelithiasis or sonographic evidence of acute cholecystitis.  2. No obstructive uropathy.  3. No abdominal free fluid.   Electronically Signed   By: Kathreen Devoid   On: 06/19/2014 10:36   Ct Abdomen Pelvis W Contrast  06/19/2014   CLINICAL DATA:  Diffuse abdominal pain, weight loss  EXAM: CT ABDOMEN AND PELVIS WITH CONTRAST  TECHNIQUE: Multidetector CT imaging of the abdomen and pelvis was performed using the standard protocol following bolus administration of intravenous contrast.  CONTRAST:  16mL OMNIPAQUE IOHEXOL 300 MG/ML  SOLN  COMPARISON:  None.  FINDINGS: Sagittal images of the spine shows diffuse osteopenia. Disc  space flattening with mild posterior spurring and vacuum disc phenomenon noted at L3-L4 level. Significant disc space flattening with endplate sclerotic changes and vacuum disc phenomenon at L5-S1 level. Mild compression deformity upper endplate of L1 vertebral body of indeterminate age. Emphysematous changes/bulla noted right base anteriorly.  There is moderate size hiatal hernia. A hemangioma in left hepatic dome measures 3.3 cm. Second hemangioma in lateral left hepatic lobe measures 3 cm. No calcified gallstones are noted within gallbladder.  Atherosclerotic calcifications and atherosclerotic plaques noted abdominal aorta and iliac arteries. A posterior wall somewhat exophytic atherosclerotic plaque noted in distal abdominal aorta axial image 34 and sagittal image 57. Measures about 1 cm. No evidence of aortic leak or obstruction.  There is a distal duodenal diverticulum just anterior to abdominal aorta measures about 2.4 cm.  The pancreas, spleen and adrenal glands are unremarkable. Kidneys are symmetrical in size and enhancement. No hydronephrosis or hydroureter.  Delayed renal images shows bilateral renal symmetrical excretion.  No small bowel obstruction.  No ascites or free air.  No adenopathy.  There are diverticula in right  colon. Minimal thickening of colonic wall in the region of colonic diverticula probable due to colonic diverticulosis. No definite evidence of acute diverticulitis. A normal appendix is clearly visualized in axial image 46. The transverse colon was is empty partially collapsed. No colonic obstruction. Scattered diverticula are noted descending colon. No evidence of acute diverticulitis. There is some gas noted in sigmoid colon. Moderate stool noted within rectum. The rectum measures 5.9 cm in diameter suspicious for mild fecal impaction. Moderate distended urinary bladder. No bladder filling defects. Prostate gland measures 5.1 x 3.3 cm. Bilateral inguinal scrotal canal small hernia containing fat without evidence of acute complication. No destructive bony lesions are noted within pelvis.  IMPRESSION: 1. Moderate size hiatal hernia is noted. 2. Hemangiomas noted in left hepatic lobe the largest measures 3.3 cm. 3. No hydronephrosis or hydroureter. 4. Extensive atherosclerotic plaques and calcifications of abdominal aorta and iliac arteries. Somewhat exophytic posterior wall atherosclerotic plaque distal abdominal aorta measures about 1 cm. No evidence of leak or aortic obstruction. 5. Normal appendix. 6. Right colon diverticula are noted. Mild thickening of right colonic wall probable due to chronic diverticulosis. No definite evidence of acute diverticulitis. 7. Moderate stool noted within rectum. Mild fecal impaction cannot be excluded. 8. Mild enlarged prostate gland. 9. Moderate distended urinary bladder. 10. A distal duodenal diverticulum measures about 2.4 Cm without evidence of acute complication.   Electronically Signed   By: Lahoma Crocker M.D.   On: 06/19/2014 13:42    Physical Exam: BP 130/70  Pulse 74  Ht 5' 7.25" (1.708 m)  Wt 134 lb (60.782 kg)  BMI 20.84 kg/m2 Constitutional: Pleasant,well-developed, white male in no acute distress. HEENT: Normocephalic and atraumatic. Conjunctivae are normal.  No scleral icterus. Neck supple.  Cardiovascular: Normal rate, regular rhythm.  Pulmonary/chest: Effort normal and breath sounds normal. No wheezing, rales or rhonchi. Abdominal: Soft, nondistended, nontender. Bowel sounds active throughout. There are no masses palpable. No hepatomegaly. Extremities: no edema Lymphadenopathy: No cervical adenopathy noted. Neurological: Alert and oriented to person place and time. Skin: Skin is warm and dry. No rashes noted. Psychiatric: Normal mood and affect. Behavior is normal.   ASSESSMENT AND PLAN: 37. 80 year old male with three-month history of progressive nausea of unclear etiology. He reports a 7 pound weight loss since onset of nausea. Labs and imaging studies nondiagnostic. For further evaluation patient will be scheduled for EGD. The benefits, risks,  and potential complications of EGD with possible biopsies were discussed with the patient and he agrees to proceed.   2. Leukocytosis, chronic.   Addendum: Reviewed and agree with initial management. Jerene Bears, MD

## 2014-06-20 NOTE — Patient Instructions (Signed)
You have been scheduled for an endoscopy. Please follow written instructions given to you at your visit today. If you use inhalers (even only as needed), please bring them with you on the day of your procedure.   

## 2014-06-20 NOTE — ED Provider Notes (Signed)
See prior note   Janice Norrie, MD 06/20/14 [phone]

## 2014-06-25 ENCOUNTER — Ambulatory Visit: Payer: Medicare Other | Admitting: Family Medicine

## 2014-06-25 ENCOUNTER — Encounter: Payer: Self-pay | Admitting: Internal Medicine

## 2014-07-04 ENCOUNTER — Ambulatory Visit: Payer: Medicare Other | Admitting: Gastroenterology

## 2014-07-04 ENCOUNTER — Telehealth: Payer: Self-pay | Admitting: Family Medicine

## 2014-07-04 MED ORDER — ALBUTEROL SULFATE HFA 108 (90 BASE) MCG/ACT IN AERS: 2.0000 | INHALATION_SPRAY | Freq: Four times a day (QID) | RESPIRATORY_TRACT | Status: DC | PRN

## 2014-07-04 NOTE — Telephone Encounter (Signed)
Inh rx sent to [facility] Pt aware

## 2014-07-06 ENCOUNTER — Telehealth: Payer: Self-pay | Admitting: Family Medicine

## 2014-07-09 ENCOUNTER — Other Ambulatory Visit: Payer: Self-pay | Admitting: Family Medicine

## 2014-07-09 ENCOUNTER — Telehealth: Payer: Self-pay | Admitting: Internal Medicine

## 2014-07-09 NOTE — Telephone Encounter (Signed)
Patient has appointment with Velora Heckler on Friday.  His pain is terrible in his stomach and still has nausea.  He would like to have a better pain medication to carry him through till his appointment day.  Please send scripts to [facility].  He says he is having a very hard time dealing with it.

## 2014-07-09 NOTE — Telephone Encounter (Signed)
Spoke with pt and he has bentyl that was sent in by another physician. Discussed with pt that he can take this for abdominal discomfort. Pt instructed to refill his zofran odt for nausea, he states that it is not helping much, pt instructed to try taking 2 pills instead to see if that helps. Pt verbalized understanding.

## 2014-07-09 NOTE — Telephone Encounter (Signed)
I would like to see patient before rx pain medicine

## 2014-07-09 NOTE — Telephone Encounter (Signed)
Patient complains of abd pain and nausea. Asked patient to contact GI doctor. He saw them on 7/22 and has a follow up on 8/14. He will call them to discuss ongoing pain and nausea.

## 2014-07-09 NOTE — Telephone Encounter (Signed)
Left message for pt to call back. Pt has bentyl and zofran on his med list.

## 2014-07-13 ENCOUNTER — Ambulatory Visit (AMBULATORY_SURGERY_CENTER): Payer: Medicare Other | Admitting: Internal Medicine

## 2014-07-13 ENCOUNTER — Encounter: Payer: Self-pay | Admitting: Internal Medicine

## 2014-07-13 VITALS — BP 138/72 | HR 67 | Temp 96.2°F | Resp 26 | Ht 67.0 in | Wt 134.0 lb

## 2014-07-13 DIAGNOSIS — R110 Nausea: Secondary | ICD-10-CM

## 2014-07-13 DIAGNOSIS — K449 Diaphragmatic hernia without obstruction or gangrene: Secondary | ICD-10-CM

## 2014-07-13 MED ORDER — SODIUM CHLORIDE 0.9 % IV SOLN: 500.0000 mL | INTRAVENOUS | Status: DC

## 2014-07-13 MED ORDER — METOCLOPRAMIDE HCL 5 MG PO TABS: 5.0000 mg | ORAL_TABLET | Freq: Three times a day (TID) | ORAL | Status: DC

## 2014-07-13 NOTE — Op Note (Signed)
[facility]  [address] [place], [id number]   ENDOSCOPY PROCEDURE REPORT  PATIENT: Albert, Allen  MR#: [id number] BIRTHDATE: 09/16/1923 , 77  yrs. old GENDER: Male ENDOSCOPIST: Jerene Bears, MD PROCEDURE DATE:  07/13/2014 PROCEDURE:  EGD, diagnostic ASA CLASS:     Class III INDICATIONS:  Nausea.  Epigastric pressure MEDICATIONS: MAC sedation, administered by CRNA and propofol (Diprivan) 150mg  IV TOPICAL ANESTHETIC: none  DESCRIPTION OF PROCEDURE: After the risks benefits and alternatives of the procedure were thoroughly explained, informed consent was obtained.  The LB [id number] endoscope was introduced through the mouth and advanced to the second portion of the duodenum. Without limitations.  The instrument was slowly withdrawn as the mucosa was fully examined.  ESOPHAGUS: A large, 6 cm, sliding-type hiatal hernia was noted. There was a partial non-obstructing ring at the GE junction which was widely patent. The mucosa of the esophagus appeared normal.  STOMACH: The mucosa of the stomach appeared normal.  DUODENUM: The duodenal mucosa showed no abnormalities in the bulb and second portion of the duodenum.  Retroflexed views revealed a hiatal hernia.     The scope was then withdrawn from the patient and the procedure completed.  COMPLICATIONS: There were no complications.  ENDOSCOPIC IMPRESSION: 1.   6 cm hiatal hernia contributing to symptoms of chest pressure and nausea 2.   The mucosa of the esophagus appeared normal 3.   The mucosa of the stomach appeared normal 4.   The duodenal mucosa showed no abnormalities in the bulb and second portion of the duodenum  RECOMMENDATIONS: 1.  Continue taking your PPI (antiacid medicine) once daily.  It is best to be taken 20-30 minutes prior to breakfast meal. 2.  Trial of metoclopramide 5 mg before meals and at bedtime, and office follow-up next available  eSigned:  Jerene Bears, MD 07/13/2014  12:15 PM   CC:The Patient and Redge Gainer, MD

## 2014-07-13 NOTE — Patient Instructions (Signed)
Dr. Vena Rua office will call you with an appointment.    YOU HAD AN ENDOSCOPIC PROCEDURE TODAY AT [facility]: Refer to the procedure report that was given to you for any specific questions about what was found during the examination.  If the procedure report does not answer your questions, please call your gastroenterologist to clarify.  If you requested that your care partner not be given the details of your procedure findings, then the procedure report has been included in a sealed envelope for you to review at your convenience later.  YOU SHOULD EXPECT: Some feelings of bloating in the abdomen. Passage of more gas than usual.  Walking can help get rid of the air that was put into your GI tract during the procedure and reduce the bloating. If you had a lower endoscopy (such as a colonoscopy or flexible sigmoidoscopy) you may notice spotting of blood in your stool or on the toilet paper. If you underwent a bowel prep for your procedure, then you may not have a normal bowel movement for a few days.  DIET: Your first meal following the procedure should be a light meal and then it is ok to progress to your normal diet.  A half-sandwich or bowl of soup is an example of a good first meal.  Heavy or fried foods are harder to digest and may make you feel nauseous or bloated.  Likewise meals heavy in dairy and vegetables can cause extra gas to form and this can also increase the bloating.  Drink plenty of fluids but you should avoid alcoholic beverages for 24 hours.  ACTIVITY: Your care partner should take you home directly after the procedure.  You should plan to take it easy, moving slowly for the rest of the day.  You can resume normal activity the day after the procedure however you should NOT DRIVE or use heavy machinery for 24 hours (because of the sedation medicines used during the test).    SYMPTOMS TO REPORT IMMEDIATELY: A gastroenterologist can be reached at any hour.  During  normal business hours, 8:30 AM to 5:00 PM Monday through Friday, call [phone].  After hours and on weekends, please call the GI answering service at [phone] who will take a message and have the physician on call contact you.   Following upper endoscopy (EGD)  Vomiting of blood or coffee ground material  New chest pain or pain under the shoulder blades  Painful or persistently difficult swallowing  New shortness of breath  Fever of 100F or higher  Black, tarry-looking stools  FOLLOW UP: If any biopsies were taken you will be contacted by phone or by letter within the next 1-3 weeks.  Call your gastroenterologist if you have not heard about the biopsies in 3 weeks.  Our staff will call the home number listed on your records the next business day following your procedure to check on you and address any questions or concerns that you may have at that time regarding the information given to you following your procedure. This is a courtesy call and so if there is no answer at the home number and we have not heard from you through the emergency physician on call, we will assume that you have returned to your regular daily activities without incident.  SIGNATURES/CONFIDENTIALITY: You and/or your care partner have signed paperwork which will be entered into your electronic medical record.  These signatures attest to the fact that that the information above on your  After Visit Summary has been reviewed and is understood.  Full responsibility of the confidentiality of this discharge information lies with you and/or your care-partner.

## 2014-07-13 NOTE — Progress Notes (Signed)
Patient stating he has a hiatal hernia and is having difficulty with food,"feels like it does not go all the way down, especially donuts, some cereals.""i really think my hiatal hernia is acting up Patient also stating he has a fluttering feeling in his chest and a rush of dizziness when shaving, "i take a big deep breath and it resolves.

## 2014-07-13 NOTE — Progress Notes (Signed)
Procedure ends, to recovery, report given and VSS. 

## 2014-07-16 ENCOUNTER — Telehealth: Payer: Self-pay | Admitting: *Deleted

## 2014-07-16 ENCOUNTER — Encounter: Payer: Medicare Other | Admitting: Internal Medicine

## 2014-07-16 NOTE — Telephone Encounter (Signed)
  Follow up Call-  Call back number 07/13/2014  Post procedure Call Back phone  # [phone]  Permission to leave phone message Yes     Patient questions:  Do you have a fever, pain , or abdominal swelling? Yes.   Pain Score  0 *  Have you tolerated food without any problems? Yes.    Have you been able to return to your normal activities? Yes.    Do you have any questions about your discharge instructions: Diet   No. Medications  No. Follow up visit  No.  Do you have questions or concerns about your Care? No.  Actions: * If pain score is 4 or above: No action needed, pain <4.  Pt states he has had nausea since midnight until this morning.  He states the nausea is why he came in to see the MD and it is not any worse.  He states he is going to call his family MD because he thinks he has a urinary infection.  No fever, no pain, just nausea

## 2014-07-20 ENCOUNTER — Other Ambulatory Visit: Payer: Self-pay | Admitting: Internal Medicine

## 2014-07-23 NOTE — Telephone Encounter (Signed)
Please make sure this patient has had a recent thyroid profile

## 2014-07-25 ENCOUNTER — Telehealth: Payer: Self-pay | Admitting: Internal Medicine

## 2014-07-25 NOTE — Telephone Encounter (Signed)
Okay would discontinue Reglan Hiatal hernia is large and unfortunately no great medical fix, small more frequent meals Remain upright for at least 2 hours after eating Avoid eating and drinking within 2 hours of bedtime Can use Zofran if nausea still a big problem, this can even be scheduled 3 times a day if necessary

## 2014-07-25 NOTE — Telephone Encounter (Signed)
Pt states that when he took the Reglan it made him dizzy and slowed his thought processes. He held it for a couple of days and tried it again but the symptoms returned. Please advise.

## 2014-07-25 NOTE — Telephone Encounter (Signed)
Left message for pt to call back.  Spoke with pt and he is aware. 

## 2014-07-26 ENCOUNTER — Encounter: Payer: Self-pay | Admitting: Family Medicine

## 2014-07-26 ENCOUNTER — Ambulatory Visit (INDEPENDENT_AMBULATORY_CARE_PROVIDER_SITE_OTHER): Payer: Medicare Other | Admitting: Family Medicine

## 2014-07-26 VITALS — BP 180/66 | HR 61 | Temp 96.8°F | Ht 68.0 in | Wt 132.0 lb

## 2014-07-26 DIAGNOSIS — R634 Abnormal weight loss: Secondary | ICD-10-CM

## 2014-07-26 DIAGNOSIS — M542 Cervicalgia: Secondary | ICD-10-CM

## 2014-07-26 MED ORDER — TRAMADOL HCL 50 MG PO TABS: 50.0000 mg | ORAL_TABLET | Freq: Three times a day (TID) | ORAL | Status: DC | PRN

## 2014-07-26 MED ORDER — MEGESTROL ACETATE 400 MG/10ML PO SUSP: 800.0000 mg | Freq: Every day | ORAL | Status: DC

## 2014-07-26 NOTE — Progress Notes (Signed)
   Subjective:    Patient ID: Albert Allen, male    DOB: May 15, 1923, 80 y.o.   MRN: [id number]  HPI  This 80 y.o. male presents for evaluation of follow up on GI consult.  He was dx with GERD and hiatel hernia.  He has ben taking PPI and is doing better.  He has been having some elevated bp and neck pain.  He states his neck pain is severe.  He c/o low appetite and weight loss.  Review of Systems C/o weight loss and neck pain   No chest pain, SOB, HA, dizziness, vision change, N/V, diarrhea, constipation, dysuria, urinary urgency or frequency or rash.  Objective:   Physical Exam Vital signs noted  Cachetic elderly male in NAD.  HEENT - Head atraumatic Normocephalic                Eyes - PERRLA, Conjuctiva - clear Sclera- Clear EOMI                Ears - EAC's Wnl TM's Wnl Gross Hearing WNL Respiratory - Lungs CTA bilateral Cardiac - RRR S1 and S2 without murmur GI - Abdomen soft Nontender and bowel sounds active x 4 MS - TTP cervical spine paraspinous muscles     Assessment & Plan:  Cervicalgia - Plan: traMADol (ULTRAM) 50 MG tablet  Loss of weight - Plan: megestrol (MEGACE) 400 MG/10ML suspension   Lysbeth Penner FNP

## 2014-07-27 ENCOUNTER — Ambulatory Visit: Payer: Medicare Other | Admitting: Family Medicine

## 2014-07-27 ENCOUNTER — Telehealth: Payer: Self-pay | Admitting: *Deleted

## 2014-07-27 NOTE — Telephone Encounter (Signed)
Albert Allen, Albert Allen has denied his megestrol, it says drugs used for wt loss, wt gain or anorexia are excluded from coverage under medicare rules.  I didn't see where he has cancer or something similar in which case I think it will cover.

## 2014-08-17 ENCOUNTER — Encounter: Payer: Self-pay | Admitting: Cardiovascular Disease

## 2014-08-17 ENCOUNTER — Ambulatory Visit (INDEPENDENT_AMBULATORY_CARE_PROVIDER_SITE_OTHER): Payer: Medicare Other | Admitting: Cardiovascular Disease

## 2014-08-17 VITALS — BP 128/78 | HR 58 | Ht 68.0 in | Wt 131.0 lb

## 2014-08-17 DIAGNOSIS — I10 Essential (primary) hypertension: Secondary | ICD-10-CM

## 2014-08-17 DIAGNOSIS — R42 Dizziness and giddiness: Secondary | ICD-10-CM

## 2014-08-17 DIAGNOSIS — I2510 Atherosclerotic heart disease of native coronary artery without angina pectoris: Secondary | ICD-10-CM

## 2014-08-17 NOTE — Patient Instructions (Signed)
Your physician wants you to follow-up in:  6 months.  You will receive a reminder letter in the mail two months in advance. If you don't receive a letter, please call our office to schedule the follow-up appointment.  Your physician has recommended you make the following change in your medication:  Stop metoprolol

## 2014-08-17 NOTE — Progress Notes (Signed)
History of Present Illness: 80 yo WM with past medical history significant for CAD, HTN, anemia, asthma/COPD, GERD, OA, gout hypothyroidism, hiatal hernia and alcohol abuse seen as a new patient 03/05/09 for evaluation of atypical chest pain, new LBBB and abnormal Myoview. Cardiac cath was performed on 03/13/09 and we found a 70-80% heavily calcified mid LAD that was severely diseased throughout the mid section with heavy calcification. There was diffuse moderately obstructive disease in the other vessels. I discussed his disease with him in the hospital and we elected for medical management given that he was asymptomatic. No stents were placed. Recent vascular screening with mild bilateral carotid artery disease, ABI of 0.9 right ,0.87 left. I saw him May 2014 after an episode of dizziness in church while sitting. He stood up to walk out and lost consciousness.  He quickly woke up. No palpitations. EMS came to the scene and BP was 110/62. He also notes getting knocked down by the lawnmower several weeks before that and he hit his head. He also noted some chest pain after meals. Echo showed normal LV size and function with mild AI and mild MR. Head CT without acute changes. Carotid artery dopplers June 2014 with mild bilateral disease. He has refused stress testing.   He is here today for follow up. He has no exertional chest pains. His breathing has been ok. He has some dizziness. He is still taking Metoprolol. Loss of appetite. Also having issues with frequent urination.   Primary Care Physician: Benay Pillow  Last Lipid Profile:Lipid Panel     Component Value Date/Time   CHOL 164 09/22/2012 1051   TRIG 175* 06/04/2014 0927   TRIG 196* 05/01/2013 1059   HDL 44 06/04/2014 0927   HDL 41.90 09/22/2012 1051   CHOLHDL 4.3 06/04/2014 0927   CHOLHDL 4 09/22/2012 1051   VLDL 35.2 09/22/2012 1051   LDLCALC 112* 06/04/2014 0927   LDLCALC 93 05/01/2013 1059   LDLCALC 87 09/22/2012 1051     Past Medical  History  Diagnosis Date  . HYPOTHYROIDISM 05/31/2007  . HYPERLIPIDEMIA 03/27/2009  . GOUT 05/31/2007  . ANEMIA, B12 DEFICIENCY 03/29/2008  . Anxiety state, unspecified 11/19/2008  . ABUSE, ALCOHOL, IN REMISSION 05/31/2007  . CARPAL TUNNEL SYNDROME, RIGHT 09/05/2007  . Alcoholic polyneuropathy 5/00/9381  . HYPERTENSION 05/31/2007  . CAD, NATIVE VESSEL 04/04/2009  . LEFT BUNDLE BRANCH BLOCK 02/11/2009  . SINUSITIS, CHRONIC NOS 05/31/2007  . ASTHMA 05/31/2007  . GERD 05/31/2007  . Chronic prostatitis 03/29/2008  . LOC OSTEOARTHROS NOT SPEC PRIM/SEC LOWER LEG 03/20/2010  . OSTEOARTHRITIS 05/31/2007  . HIP PAIN 03/20/2010  . DEGENERATIVE DISC DISEASE, CERVICAL SPINE 06/22/2007  . Adhesive capsulitis of shoulder 12/29/2007  . SYNCOPE 11/19/2008  . WEIGHT LOSS, RECENT 11/19/2008  . DYSPHAGIA UNSPECIFIED 09/25/2008  . Hepatomegaly 09/25/2008  . PSA, INCREASED 03/20/2010  . ABNORMAL CV (STRESS) TEST 03/05/2009  . PERSONAL HX COLONIC POLYPS 03/05/1997    adenomatous   . Liver mass, left lobe   . Hiatal hernia   . Esophageal stricture     Past Surgical History  Procedure Laterality Date  . Pul. colapse w/chest tube    . Tonsillectomy    . Carpel tunnel release    . Orif ulnar fracture  11/03/2012    Procedure: OPEN REDUCTION INTERNAL FIXATION (ORIF) ULNAR FRACTURE;  Surgeon: Hessie Dibble, MD;  Location: [facility];  Service: Orthopedics;  Laterality: Right;  ORIF radius and ulnar fractures    Current  Outpatient Prescriptions  Medication Sig Dispense Refill  . albuterol (PROVENTIL HFA;VENTOLIN HFA) 108 (90 BASE) MCG/ACT inhaler Inhale 2 puffs into the lungs every 6 (six) hours as needed for wheezing or shortness of breath.  3.7 g  11  . alfuzosin (UROXATRAL) 10 MG 24 hr tablet Take 10 mg by mouth daily with breakfast. As Needed      . ALPRAZolam (XANAX) 0.25 MG tablet Take 1 tablet (0.25 mg total) by mouth every 6 (six) hours as needed for sleep.  60 tablet  3  . aspirin EC 81 MG tablet Take 81 mg by mouth daily.       . diclofenac (VOLTAREN) 75 MG EC tablet Take 75 mg by mouth every evening. Prn      . dicyclomine (BENTYL) 10 MG capsule TAKE 1 CAPSULE (10 MG TOTAL)  BY MOUTH 3 (THREE) TIMES DAILY BEFORE MEA LS. As needed      . levothyroxine (SYNTHROID, LEVOTHROID) 75 MCG tablet TAKE ONE TABLET BY MOUTH ONE TIME DAILY  90 tablet  2  . losartan (COZAAR) 100 MG tablet Take 100 mg by mouth daily.      . metoprolol tartrate (LOPRESSOR) 25 MG tablet Take 1/2 tab twice a day      . montelukast (SINGULAIR) 10 MG tablet Take 10 mg by mouth at bedtime.      Marland Kitchen omeprazole (PRILOSEC) 20 MG capsule Take 20 mg by mouth daily.      . ondansetron (ZOFRAN ODT) 8 MG disintegrating tablet Take 1 tablet (8 mg total) by mouth every 8 (eight) hours as needed for nausea or vomiting.  30 tablet  2   No current facility-administered medications for this visit.    Allergies  Allergen Reactions  . Penicillins Swelling    LIPS  . Amoxicillin Other (See Comments)    REACTION: unspecified  . Ceclor [Cefaclor] Other (See Comments)    Reaction: unspecified  . Celebrex [Celecoxib] Other (See Comments)    Reaction: Unspecified  . Levaquin [Levofloxacin In D5w] Other (See Comments)    Pt does not remember    History   Social History  . Marital Status: Married    Spouse Name: N/A    Number of Children: N/A  . Years of Education: N/A   Occupational History  . retired    Social History Main Topics  . Smoking status: Former Smoker    Types: Cigarettes    Quit date: 12/27/2005  . Smokeless tobacco: Not on file  . Alcohol Use: No  . Drug Use: No  . Sexual Activity: Not Currently   Other Topics Concern  . Not on file   Social History Narrative  . No narrative on file    Family History  Problem Relation Age of Onset  . Cancer Mother     In back but unsure of what type of cancer  . COPD Father   . Colon cancer Neg Hx     Review of Systems:  As stated in the HPI and otherwise negative.   BP 128/78  Pulse  58  Ht 5\' 8"  (1.727 m)  Wt 131 lb (59.421 kg)  BMI 19.92 kg/m2  Physical Examination: General: Well developed, well nourished, NAD HEENT: OP clear, mucus membranes moist SKIN: warm, dry. No rashes. Neuro: No focal deficits Musculoskeletal: Muscle strength 5/5 all ext Psychiatric: Mood and affect normal Neck: No JVD, no carotid bruits, no thyromegaly, no lymphadenopathy. Lungs:Clear bilaterally, no wheezes, rhonci, crackles Cardiovascular: Regular rate and rhythm. No  murmurs, gallops or rubs. Abdomen:Soft. Bowel sounds present. Non-tender.  Extremities: No lower extremity edema. Pulses are 2 + in the bilateral DP/PT.  Echo 04/25/13: Left ventricle: The cavity size was normal. Wall thickness was increased in a pattern of mild LVH. Systolic function was normal. The estimated ejection fraction was in the range of 55% to 65%. Wall motion was normal; there were no regional wall motion abnormalities. Doppler parameters are consistent with abnormal left ventricular relaxation (grade 1 diastolic dysfunction). - Aortic valve: Mild regurgitation. - Mitral valve: Calcified annulus. Mild regurgitation.  Assessment and Plan:   1. CAD: Stable. He has no chest pain or SOB. Continue ASA, statin. Will stop beta blocker due to bradycardia. I have discussed arranging a stress test to exclude ischemia with known LAD disease. He refuses stress testing.   2. Dizziness: No syncope. May be related to bradycardia. Will stop metoprolol.   3. HTN: Continue Cozaar 100 mg po Qaily.

## 2014-09-14 ENCOUNTER — Encounter: Payer: Self-pay | Admitting: Internal Medicine

## 2014-09-14 ENCOUNTER — Ambulatory Visit (INDEPENDENT_AMBULATORY_CARE_PROVIDER_SITE_OTHER): Payer: Medicare Other | Admitting: Internal Medicine

## 2014-09-14 ENCOUNTER — Other Ambulatory Visit (INDEPENDENT_AMBULATORY_CARE_PROVIDER_SITE_OTHER): Payer: Medicare Other

## 2014-09-14 VITALS — BP 132/60 | HR 76 | Ht 67.0 in | Wt 133.4 lb

## 2014-09-14 DIAGNOSIS — D508 Other iron deficiency anemias: Secondary | ICD-10-CM

## 2014-09-14 DIAGNOSIS — R634 Abnormal weight loss: Secondary | ICD-10-CM

## 2014-09-14 DIAGNOSIS — R110 Nausea: Secondary | ICD-10-CM

## 2014-09-14 DIAGNOSIS — K449 Diaphragmatic hernia without obstruction or gangrene: Secondary | ICD-10-CM

## 2014-09-14 DIAGNOSIS — K5900 Constipation, unspecified: Secondary | ICD-10-CM

## 2014-09-14 LAB — CBC WITH DIFFERENTIAL/PLATELET
BASOS ABS: 0 10*3/uL (ref 0.0–0.1)
Basophils Relative: 0.3 % (ref 0.0–3.0)
Eosinophils Absolute: 0.1 10*3/uL (ref 0.0–0.7)
Eosinophils Relative: 1.8 % (ref 0.0–5.0)
HCT: 40.8 % (ref 39.0–52.0)
Hemoglobin: 13.6 g/dL (ref 13.0–17.0)
LYMPHS PCT: 27.9 % (ref 12.0–46.0)
Lymphs Abs: 1.1 10*3/uL (ref 0.7–4.0)
MCHC: 33.2 g/dL (ref 30.0–36.0)
MCV: 102.2 fl — ABNORMAL HIGH (ref 78.0–100.0)
MONOS PCT: 18.6 % — AB (ref 3.0–12.0)
Monocytes Absolute: 0.7 10*3/uL (ref 0.1–1.0)
NEUTROS ABS: 1.9 10*3/uL (ref 1.4–7.7)
Neutrophils Relative %: 51.4 % (ref 43.0–77.0)
PLATELETS: 231 10*3/uL (ref 150.0–400.0)
RBC: 3.99 Mil/uL — ABNORMAL LOW (ref 4.22–5.81)
RDW: 13.5 % (ref 11.5–15.5)
WBC: 3.8 10*3/uL — ABNORMAL LOW (ref 4.0–10.5)

## 2014-09-14 LAB — FERRITIN: FERRITIN: 41.4 ng/mL (ref 22.0–322.0)

## 2014-09-14 NOTE — Progress Notes (Signed)
Subjective:    Patient ID: Albert Allen, male    DOB: [birth year], 80 y.o.   MRN: [id number]  HPI Albert Allen is an 80 yo male with PMH of GERD with hiatal hernia, hypothyroidism, hyperlipidemia, alcohol abuse in remission, colon polyps, diverticulosis who is seen for followup. He was seen by Tye Savoy in July 2015 to evaluate nausea. He came for upper endoscopy on 07/13/2014 which revealed a 6 cm hiatal hernia, with normal mucosa in the esophagus, stomach and duodenum. At that time he was given a trial of Reglan to see if this helped with gastric emptying and also chest pressure. He took this medication but noted dizziness and so discontinued it. Overall he states his nausea has improved and he is not vomiting. He is eating food with an improved appetite and is also drinking Ensure. He has gained a couple pounds over the last several months. He still has issues with constipation but produces help this tremendously. He has not seen black or tarry stools and had no rectal bleeding. He does occasionally get pain and pressure across his lower abdomen which improves after urination. He also occasionally has lower GI gas. He states he is seen by urology and told his bladder is emptying "okay". He does state that at times he has anxiety and he feels this is related to his wife. He notes his wife has had issues with memory, particularly short-term. He is now performing all of the transportation for her and she questions him a lot. He reports his urologist recommended he speak to his primary care provider about low-dose SSRI. He is open to this idea. Also since being seen here his metoprolol has been stopped by his cardiologist due to bradycardia. Finally he was told that his "red count" was 9. This is lower than our previous measurements. He has not seen any bleeding from any location. No melena  Review of Systems As per HPI, otherwise negative  Current Medications, Allergies, Past Medical History,  Past Surgical History, Family History and Social History were reviewed in [employer] record.     Objective:   Physical Exam BP 132/60  Pulse 76  Ht 5\' 7"  (1.702 m)  Wt 133 lb 6 oz (60.499 kg)  BMI 20.88 kg/m2 Constitutional: Well-developed and well-nourished. No distress. HEENT: Normocephalic and atraumatic. Oropharynx is clear and moist. No oropharyngeal exudate. Conjunctivae are normal.  No scleral icterus. Neck: Neck supple. Trachea midline. Cardiovascular: Normal rate, regular rhythm and intact distal pulses.  Pulmonary/chest: Effort normal and breath sounds normal. No wheezing, rales or rhonchi. Abdominal: Soft, nontender, nondistended. Bowel sounds active throughou Extremities: no clubbing, cyanosis, or edema Lymphadenopathy: No cervical adenopathy noted. Neurological: Alert and oriented to person place and time. Psychiatric: Normal mood and affect. Behavior is normal.  EGD - Aug 2015, reviewed and discussed with pt.  CT ABDOMEN AND PELVIS WITH CONTRAST   TECHNIQUE: Multidetector CT imaging of the abdomen and pelvis was performed using the standard protocol following bolus administration of intravenous contrast.   CONTRAST:  163mL OMNIPAQUE IOHEXOL 300 MG/ML  SOLN   COMPARISON:  None.   FINDINGS: Sagittal images of the spine shows diffuse osteopenia. Disc space flattening with mild posterior spurring and vacuum disc phenomenon noted at L3-L4 level. Significant disc space flattening with endplate sclerotic changes and vacuum disc phenomenon at L5-S1 level. Mild compression deformity upper endplate of L1 vertebral body of indeterminate age. Emphysematous changes/bulla noted right base anteriorly.   There is moderate size  hiatal hernia. A hemangioma in left hepatic dome measures 3.3 cm. Second hemangioma in lateral left hepatic lobe measures 3 cm. No calcified gallstones are noted within gallbladder.   Atherosclerotic calcifications and  atherosclerotic plaques noted abdominal aorta and iliac arteries. A posterior wall somewhat exophytic atherosclerotic plaque noted in distal abdominal aorta axial image 34 and sagittal image 57. Measures about 1 cm. No evidence of aortic leak or obstruction.   There is a distal duodenal diverticulum just anterior to abdominal aorta measures about 2.4 cm.   The pancreas, spleen and adrenal glands are unremarkable. Kidneys are symmetrical in size and enhancement. No hydronephrosis or hydroureter.   Delayed renal images shows bilateral renal symmetrical excretion.   No small bowel obstruction.  No ascites or free air.  No adenopathy.   There are diverticula in right colon. Minimal thickening of colonic wall in the region of colonic diverticula probable due to colonic diverticulosis. No definite evidence of acute diverticulitis. A normal appendix is clearly visualized in axial image 46. The transverse colon was is empty partially collapsed. No colonic obstruction. Scattered diverticula are noted descending colon. No evidence of acute diverticulitis. There is some gas noted in sigmoid colon. Moderate stool noted within rectum. The rectum measures 5.9 cm in diameter suspicious for mild fecal impaction. Moderate distended urinary bladder. No bladder filling defects. Prostate gland measures 5.1 x 3.3 cm. Bilateral inguinal scrotal canal small hernia containing fat without evidence of acute complication. No destructive bony lesions are noted within pelvis.   IMPRESSION: 1. Moderate size hiatal hernia is noted. 2. Hemangiomas noted in left hepatic lobe the largest measures 3.3 cm. 3. No hydronephrosis or hydroureter. 4. Extensive atherosclerotic plaques and calcifications of abdominal aorta and iliac arteries. Somewhat exophytic posterior wall atherosclerotic plaque distal abdominal aorta measures about 1 cm. No evidence of leak or aortic obstruction. 5. Normal appendix. 6. Right colon  diverticula are noted. Mild thickening of right colonic wall probable due to chronic diverticulosis. No definite evidence of acute diverticulitis. 7. Moderate stool noted within rectum. Mild fecal impaction cannot be excluded. 8. Mild enlarged prostate gland. 9. Moderate distended urinary bladder. 10. A distal duodenal diverticulum measures about 2.4 Cm without evidence of acute complication.     Electronically Signed   By: Lahoma Crocker M.D.   On: 06/19/2014 13:42       Assessment & Plan:  80 yo male with PMH of GERD with hiatal hernia, hypothyroidism, hyperlipidemia, alcohol abuse in remission, colon polyps, diverticulosis who is seen for followup.  1. Nausea/hiatal hernia -- his nausea overall has improved as has his chest pressure. He did not tolerate metoclopramide therapy. We will continue omeprazole 20 mg daily. We discussed his hiatal hernia at length and I do not think he is a good surgical candidate nor do I believe that surgery is worth the risk. I encouraged he continue reflux precautions. He has elevated the head of his bed and he feels this has helped. I encouraged smaller more frequent meals.  2. Questionable anemia -- repeat CBC and check ferritin level today. Last hemoglobin from July was normal  3. Constipation with history of diverticulosis -- no evidence for diverticulitis. He has responded well to produce. I encouraged him to use prune juice on a daily basis to help keep his bowels moving.  4. Weight loss -- no further weight loss and weight has stabilized. Continue Ensure daily as a supplement  Followup was offered and he prefers to call as needed.

## 2014-09-14 NOTE — Patient Instructions (Signed)
Continue Omeprazole daily Go to the basement for labs Drink Prune Juice daily as discussed Use Ensure daily

## 2014-12-05 ENCOUNTER — Other Ambulatory Visit: Payer: Self-pay

## 2014-12-05 MED ORDER — LOSARTAN POTASSIUM 100 MG PO TABS: 100.0000 mg | ORAL_TABLET | Freq: Every day | ORAL | Status: DC

## 2015-01-01 ENCOUNTER — Ambulatory Visit (INDEPENDENT_AMBULATORY_CARE_PROVIDER_SITE_OTHER): Payer: Medicare Other | Admitting: Family Medicine

## 2015-01-01 ENCOUNTER — Encounter: Payer: Self-pay | Admitting: Family Medicine

## 2015-01-01 VITALS — BP 185/74 | HR 95 | Temp 97.3°F | Ht 67.0 in | Wt 141.0 lb

## 2015-01-01 DIAGNOSIS — F329 Major depressive disorder, single episode, unspecified: Secondary | ICD-10-CM

## 2015-01-01 DIAGNOSIS — I10 Essential (primary) hypertension: Secondary | ICD-10-CM

## 2015-01-01 DIAGNOSIS — E785 Hyperlipidemia, unspecified: Secondary | ICD-10-CM | POA: Diagnosis not present

## 2015-01-01 DIAGNOSIS — K219 Gastro-esophageal reflux disease without esophagitis: Secondary | ICD-10-CM | POA: Diagnosis not present

## 2015-01-01 DIAGNOSIS — F32A Depression, unspecified: Secondary | ICD-10-CM

## 2015-01-01 MED ORDER — OMEPRAZOLE 20 MG PO CPDR: 20.0000 mg | DELAYED_RELEASE_CAPSULE | Freq: Every day | ORAL | Status: DC

## 2015-01-01 MED ORDER — CITALOPRAM HYDROBROMIDE 10 MG PO TABS: 10.0000 mg | ORAL_TABLET | Freq: Every day | ORAL | Status: DC

## 2015-01-01 MED ORDER — PRAVASTATIN SODIUM 40 MG PO TABS: 40.0000 mg | ORAL_TABLET | Freq: Every day | ORAL | Status: DC

## 2015-01-01 MED ORDER — HYDROCHLOROTHIAZIDE 12.5 MG PO CAPS: 12.5000 mg | ORAL_CAPSULE | Freq: Every day | ORAL | Status: DC

## 2015-01-01 MED ORDER — ALPRAZOLAM 0.25 MG PO TABS: 0.2500 mg | ORAL_TABLET | Freq: Four times a day (QID) | ORAL | Status: DC | PRN

## 2015-01-01 MED ORDER — LOSARTAN POTASSIUM 100 MG PO TABS: 100.0000 mg | ORAL_TABLET | Freq: Every day | ORAL | Status: DC

## 2015-01-01 NOTE — Progress Notes (Signed)
   Subjective:    Patient ID: MANN SKAGGS, male    DOB: 1923/08/01, 81 y.o.   MRN: C/o depression, his wife has alzheimers.   He sees urology for bph.  He has HTN. He had seen his urologist and was told to see his PCP to get on antidepressant. His cardiologist told him to cut his metroprolol to once a day.  His BP is elevated today.  He has gained weight back.  He cannot tolerate crestor due to arthralgias and stopped taking crestor.  He needs refill on xanax.  Review of Systems  Constitutional: Negative for fever.  HENT: Negative for ear pain.   Eyes: Negative for discharge.  Respiratory: Negative for cough.   Cardiovascular: Negative for chest pain.  Gastrointestinal: Negative for abdominal distention.  Endocrine: Negative for polyuria.  Genitourinary: Negative for difficulty urinating.  Musculoskeletal: Negative for gait problem and neck pain.  Skin: Negative for color change and rash.  Neurological: Negative for speech difficulty and headaches.  Psychiatric/Behavioral: Negative for agitation.       Objective:    BP 185/74 mmHg  Pulse 95  Temp(Src) 97.3 F (36.3 C) (Oral)  Ht 5\' 7"  (1.702 m)  Wt 141 lb (63.957 kg)  BMI 22.08 kg/m2 Physical Exam  Constitutional: He is oriented to person, place, and time. He appears well-developed and well-nourished.  HENT:  Head: Normocephalic and atraumatic.  Mouth/Throat: Oropharynx is clear and moist.  Eyes: Pupils are equal, round, and reactive to light.  Neck: Normal range of motion. Neck supple.  Cardiovascular: Normal rate and regular rhythm.   No murmur heard. Pulmonary/Chest: Effort normal and breath sounds normal.  Abdominal: Soft. Bowel sounds are normal. There is no tenderness.  Neurological: He is alert and oriented to person, place, and time.  Skin: Skin is warm and dry.  Psychiatric: He has a normal mood and affect.          Assessment & Plan:     ICD-9-CM ICD-10-CM   1. Depression 311 F32.9 citalopram  (CELEXA) 10 MG tablet     ALPRAZolam (XANAX) 0.25 MG tablet  2. Essential hypertension 401.9 I10 hydrochlorothiazide (MICROZIDE) 12.5 MG capsule     losartan (COZAAR) 100 MG tablet  3. Hyperlipidemia 272.4 E78.5 pravastatin (PRAVACHOL) 40 MG tablet  4. Gastroesophageal reflux disease without esophagitis 530.81 K21.9 omeprazole (PRILOSEC) 20 MG capsule   Follow up in 3 weeks.  He has HCTZ 12.5mg  po qd added.  He has been rx'd celexa for depression.  He is taking new statin pravachol since he cannot tolerate crestor.    No Follow-up on file.  Lysbeth Penner FNP

## 2015-01-08 ENCOUNTER — Ambulatory Visit (INDEPENDENT_AMBULATORY_CARE_PROVIDER_SITE_OTHER): Payer: Medicare Other | Admitting: Family Medicine

## 2015-01-08 VITALS — BP 170/84 | HR 92 | Temp 97.6°F | Ht 67.0 in | Wt 139.6 lb

## 2015-01-08 DIAGNOSIS — J206 Acute bronchitis due to rhinovirus: Secondary | ICD-10-CM

## 2015-01-08 MED ORDER — METHYLPREDNISOLONE ACETATE 80 MG/ML IJ SUSP
80.0000 mg | Freq: Once | INTRAMUSCULAR | Status: AC
  Administered 2015-01-08: 80 mg via INTRAMUSCULAR

## 2015-01-08 MED ORDER — IPRATROPIUM-ALBUTEROL 0.5-2.5 (3) MG/3ML IN SOLN
3.0000 mL | Freq: Once | RESPIRATORY_TRACT | Status: AC
  Administered 2015-01-08: 3 mL via RESPIRATORY_TRACT

## 2015-01-08 MED ORDER — AZITHROMYCIN 250 MG PO TABS: ORAL_TABLET | ORAL | Status: DC

## 2015-01-08 MED ORDER — IPRATROPIUM-ALBUTEROL 0.5-2.5 (3) MG/3ML IN SOLN: 3.0000 mL | RESPIRATORY_TRACT | Status: DC

## 2015-01-08 MED ORDER — LEVOTHYROXINE SODIUM 75 MCG PO TABS: 75.0000 ug | ORAL_TABLET | Freq: Every day | ORAL | Status: DC

## 2015-01-08 NOTE — Progress Notes (Signed)
   Subjective:    Patient ID: Albert Allen, male    DOB: 10-18-1923, 81 y.o.   MRN: [id number]  HPI C/o cough and uri sx's for over a week.  He is having wheezing.  Review of Systems  Constitutional: Negative for fever.  HENT: Negative for ear pain.   Eyes: Negative for discharge.  Respiratory: Negative for cough.   Cardiovascular: Negative for chest pain.  Gastrointestinal: Negative for abdominal distention.  Endocrine: Negative for polyuria.  Genitourinary: Negative for difficulty urinating.  Musculoskeletal: Negative for gait problem and neck pain.  Skin: Negative for color change and rash.  Neurological: Negative for speech difficulty and headaches.  Psychiatric/Behavioral: Negative for agitation.       Objective:    BP 170/84 mmHg  Pulse 92  Temp(Src) 97.6 F (36.4 C) (Oral)  Ht 5\' 7"  (1.702 m)  Wt 139 lb 9.6 oz (63.322 kg)  BMI 21.86 kg/m2 Physical Exam  Constitutional: He is oriented to person, place, and time. He appears well-developed and well-nourished.  HENT:  Head: Normocephalic and atraumatic.  Mouth/Throat: Oropharynx is clear and moist.  Eyes: Pupils are equal, round, and reactive to light.  Neck: Normal range of motion. Neck supple.  Cardiovascular: Normal rate and regular rhythm.   No murmur heard. Pulmonary/Chest: Effort normal. He has wheezes.  Abdominal: Soft. Bowel sounds are normal. There is no tenderness.  Neurological: He is alert and oriented to person, place, and time.  Skin: Skin is warm and dry.  Psychiatric: He has a normal mood and affect.          Assessment & Plan:     ICD-9-CM ICD-10-CM   1. Acute bronchitis due to Rhinovirus 466.0 J20.6 methylPREDNISolone acetate (DEPO-MEDROL) injection 80 mg   079.3  azithromycin (ZITHROMAX) 250 MG tablet     ipratropium-albuterol (DUONEB) 0.5-2.5 (3) MG/3ML nebulizer solution 3 mL     levothyroxine (SYNTHROID, LEVOTHROID) 75 MCG tablet     No Follow-up on file.  Lysbeth Penner  FNP

## 2015-01-22 ENCOUNTER — Encounter: Payer: Self-pay | Admitting: Family Medicine

## 2015-01-22 ENCOUNTER — Ambulatory Visit (INDEPENDENT_AMBULATORY_CARE_PROVIDER_SITE_OTHER): Payer: Medicare Other | Admitting: Family Medicine

## 2015-01-22 VITALS — BP 165/67 | HR 88 | Temp 97.0°F | Ht 67.0 in | Wt 135.8 lb

## 2015-01-22 DIAGNOSIS — K449 Diaphragmatic hernia without obstruction or gangrene: Secondary | ICD-10-CM | POA: Diagnosis not present

## 2015-01-22 DIAGNOSIS — R7989 Other specified abnormal findings of blood chemistry: Secondary | ICD-10-CM

## 2015-01-22 DIAGNOSIS — I10 Essential (primary) hypertension: Secondary | ICD-10-CM | POA: Diagnosis not present

## 2015-01-22 DIAGNOSIS — N401 Enlarged prostate with lower urinary tract symptoms: Secondary | ICD-10-CM

## 2015-01-22 DIAGNOSIS — R110 Nausea: Secondary | ICD-10-CM | POA: Diagnosis not present

## 2015-01-22 DIAGNOSIS — R748 Abnormal levels of other serum enzymes: Secondary | ICD-10-CM | POA: Diagnosis not present

## 2015-01-22 DIAGNOSIS — R0789 Other chest pain: Secondary | ICD-10-CM

## 2015-01-22 DIAGNOSIS — R55 Syncope and collapse: Secondary | ICD-10-CM | POA: Diagnosis not present

## 2015-01-22 DIAGNOSIS — K219 Gastro-esophageal reflux disease without esophagitis: Secondary | ICD-10-CM

## 2015-01-22 DIAGNOSIS — R7303 Prediabetes: Secondary | ICD-10-CM

## 2015-01-22 DIAGNOSIS — N138 Other obstructive and reflux uropathy: Secondary | ICD-10-CM

## 2015-01-22 DIAGNOSIS — R7309 Other abnormal glucose: Secondary | ICD-10-CM

## 2015-01-22 LAB — POCT GLYCOSYLATED HEMOGLOBIN (HGB A1C): Hemoglobin A1C: 6.1

## 2015-01-22 MED ORDER — ESCITALOPRAM OXALATE 5 MG PO TABS: 5.0000 mg | ORAL_TABLET | Freq: Every day | ORAL | Status: DC

## 2015-01-22 MED ORDER — VALSARTAN 160 MG PO TABS: 160.0000 mg | ORAL_TABLET | Freq: Every day | ORAL | Status: DC

## 2015-01-22 MED ORDER — LANSOPRAZOLE 30 MG PO CPDR: DELAYED_RELEASE_CAPSULE | ORAL | Status: DC

## 2015-01-22 NOTE — Progress Notes (Signed)
Subjective:  Patient ID: Albert Allen, male    DOB: 1923/02/22  Age: 81 y.o. MRN: [id number]  CC: Nausea and Gastrophageal Reflux   HPI Albert Allen presents for multiple spells of nausea. He says he gets a sensation in his mid chest with some lightheadedness and dysphoria as well as nausea. This occurred on several occasions one occurred just after he was here at the pharmacy when he went to pick up his new medicine last time. He said it was so severe that time he felt like he was going to go out but he sat down and was able to get it together after just a few minutes. This was not accompanied by chest pain other than the nausea and he does have heartburn which was not temporally related. There was no headache no diaphoresis. This has continued to happen 2 or 3 times a week since then. It seems to have gotten worse since starting the new medicine citalopram and HCTZ for his blood pressure. Additionally he notes that he is having to get up to go to the bathroom much more frequently.  He was recently started on Cytoxan because his urologist who has known him well for many years told him he seemed to be somewhat depressed. The patient concurred. Therefore at his last office visit here he was started on citalopram.  Patient presents for follow-up on  thyroid. She has a history of hypothyroidism for many years. It has been stable recently. Pt. denies any change in  voice, loss of hair, heat or cold intolerance. Energy level has been adequate to good. She denies constipation and diarrhea. No myxedema. Medication is as noted below. Verified that pt is taking it daily on an empty stomach. Well tolerated.  Patient in for follow-up of hypertension. Patient has no history of headache chest pain or shortness of breath or recent cough. Patient also denies symptoms of TIA such as numbness weakness lateralizing. Patient checks  blood pressure at home and has not had any elevated readings recently. Patient  denies side effects from his medication. States taking it regularly.   History of prediabetes with a 6.4 A1c noted several years ago and no recent follow-up.  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); HYPERTENSION (05/31/2007); CAD, NATIVE VESSEL (04/04/2009); LEFT BUNDLE BRANCH BLOCK (02/11/2009); SINUSITIS, CHRONIC NOS (05/31/2007); ASTHMA (05/31/2007); GERD (05/31/2007); Chronic prostatitis (03/29/2008); LOC OSTEOARTHROS NOT SPEC PRIM/SEC LOWER LEG (03/20/2010); OSTEOARTHRITIS (05/31/2007); HIP PAIN (03/20/2010); DEGENERATIVE DISC DISEASE, CERVICAL SPINE (06/22/2007); Adhesive capsulitis of shoulder (12/29/2007); SYNCOPE (11/19/2008); WEIGHT LOSS, RECENT (11/19/2008); DYSPHAGIA UNSPECIFIED (09/25/2008); Hepatomegaly (09/25/2008); PSA, INCREASED (03/20/2010); ABNORMAL CV (STRESS) TEST (03/05/2009); PERSONAL HX COLONIC POLYPS (03/05/1997); Liver mass, left lobe; Hiatal hernia; and Esophageal stricture.   He has past surgical history that includes pul. colapse w/chest tube; Tonsillectomy; carpel tunnel release; and ORIF ulnar fracture (11/03/2012).   His family history includes COPD in his father; Cancer in his mother. There is no history of Colon cancer.He reports that he quit smoking about 9 years ago. His smoking use included Cigarettes. He does not have any smokeless tobacco history on file. He reports that he does not drink alcohol or use illicit drugs.  Current Outpatient Prescriptions on File Prior to Visit  Medication Sig Dispense Refill  . albuterol (PROVENTIL HFA;VENTOLIN HFA) 108 (90 BASE) MCG/ACT inhaler Inhale 2 puffs  into the lungs every 6 (six) hours as needed for wheezing or shortness of breath. 3.7 g 11  . ALPRAZolam (XANAX) 0.25 MG tablet  Take 1 tablet (0.25 mg total) by mouth every 6 (six) hours as needed for sleep. 60 tablet 3  . aspirin EC 81 MG tablet Take 81 mg by mouth daily.    . diclofenac (VOLTAREN) 75 MG EC tablet Take 75 mg by mouth every evening. Prn    . levothyroxine (SYNTHROID, LEVOTHROID) 75 MCG tablet Take 1 tablet (75 mcg total) by mouth daily. 90 tablet 3  . montelukast (SINGULAIR) 10 MG tablet Take 10 mg by mouth at bedtime.    . ondansetron (ZOFRAN ODT) 8 MG disintegrating tablet Take 1 tablet (8 mg total) by mouth every 8 (eight) hours as needed for nausea or vomiting. 30 tablet 2  . pravastatin (PRAVACHOL) 40 MG tablet Take 1 tablet (40 mg total) by mouth daily. 90 tablet 3   No current facility-administered medications on file prior to visit.    ROS Review of Systems  Constitutional: Negative for fever, chills, diaphoresis and unexpected weight change.  HENT: Negative for congestion, hearing loss, rhinorrhea, sore throat and trouble swallowing.   Respiratory: Negative for cough, chest tightness, shortness of breath and wheezing.   Gastrointestinal: Negative for nausea, vomiting, abdominal pain, diarrhea, constipation and abdominal distention.  Endocrine: Negative for cold intolerance and heat intolerance.  Genitourinary: Positive for frequency (With nocturia). Negative for dysuria, hematuria and flank pain.  Musculoskeletal: Negative for joint swelling and arthralgias.  Skin: Negative for rash.  Neurological: Positive for syncope (actually near-syncope see history of present illness). Negative for headaches.  Psychiatric/Behavioral: Negative for dysphoric mood, decreased concentration and agitation. The patient is not nervous/anxious.     Objective:  BP 165/67 mmHg  Pulse 88  Temp(Src) 97 F (36.1 C) (Oral)  Ht $R'5\' 7"'AF$  (1.702 m)  Wt 135 lb 12.8 oz (61.598 kg)  BMI 21.26 kg/m2  BP Readings from Last 3 Encounters:  01/22/15 165/67  01/08/15 170/84  01/01/15 185/74    Wt Readings from Last 3  Encounters:  01/22/15 135 lb 12.8 oz (61.598 kg)  01/08/15 139 lb 9.6 oz (63.322 kg)  01/01/15 141 lb (63.957 kg)     Physical Exam  Constitutional: He is oriented to person, place, and time. He appears well-developed and well-nourished. No distress.  HENT:  Head: Normocephalic and atraumatic.  Right Ear: External ear normal.  Left Ear: External ear normal.  Nose: Nose normal.  Mouth/Throat: Oropharynx is clear and moist.  Eyes: Conjunctivae and EOM are normal. Pupils are equal, round, and reactive to light.  Neck: Normal range of motion. Neck supple. No thyromegaly present.  Cardiovascular: Normal rate, regular rhythm and normal heart sounds.   No murmur heard. Pulmonary/Chest: Effort normal and breath sounds normal. No respiratory distress. He has no wheezes. He has no rales.  Abdominal: Soft. Bowel sounds are normal. He exhibits no distension. There is no tenderness.  Lymphadenopathy:    He has no cervical adenopathy.  Neurological: He is alert and oriented to person, place, and time. He has normal reflexes.  Skin: Skin is warm and dry.  Psychiatric: He has a normal mood and affect. His behavior is normal. Judgment and thought content normal.    Lab Results  Component Value Date   HGBA1C 6.1% 01/22/2015   HGBA1C 6.4* 11/19/2008    Lab Results  Component Value Date   WBC 3.8* 09/14/2014   HGB 13.6 09/14/2014  HCT 40.8 09/14/2014   PLT 231.0 09/14/2014   GLUCOSE 97 06/19/2014   CHOL 164 09/22/2012   TRIG 175* 06/04/2014   HDL 44 06/04/2014   LDLDIRECT 67.7 01/12/2012   LDLCALC 112* 06/04/2014   ALT 8 06/19/2014   AST 13 06/19/2014   NA 137 06/19/2014   K 4.2 06/19/2014   CL 97 06/19/2014   CREATININE 0.90 06/19/2014   BUN 8 06/19/2014   CO2 23 06/19/2014   TSH 1.790 06/04/2014   PSA 1.42 01/12/2012   INR 0.97 11/03/2012   HGBA1C 6.1% 01/22/2015    Dg Chest 2 View  06/19/2014   CLINICAL DATA:  Weakness and abdominal pain and nausea  EXAM: CHEST  2 VIEW   COMPARISON:  PA and lateral chest of May 15, 2013  FINDINGS: The lungs remain hyperinflated. There are coarse right infrahilar lung markings not significantly changed from the previous study. There is no pleural effusion. The heart and pulmonary vascularity are normal. There is no free subdiaphragmatic gas collection. There is stable mild anterior wedging of approximately T8.  IMPRESSION: COPD. There is no pneumonia nor CHF nor other acute cardiopulmonary abnormality.   Electronically Signed   By: David  Martinique   On: 06/19/2014 10:15   US Abdomen Complete  06/19/2014   CLINICAL DATA:  Right upper quadrant pain  EXAM: ULTRASOUND ABDOMEN COMPLETE  COMPARISON:  CT abdomen 04/21/2005 ; abdominal ultrasound 09/28/2008.  FINDINGS: Gallbladder:  No gallstones or wall thickening visualized. No sonographic Murphy sign noted.  Common bile duct:  Diameter: 6 mm  Liver:  There are 2 hyperechoic left hepatic masses measuring 2.1 x 2.4 x 2.3 cm and 2.1 x 2.3 x 2.1 cm respectively most consistent with a hemangioma are unchanged in size compared with prior CT of the abdomen dated 04/21/2005. Within normal limits in parenchymal echogenicity. There is no abdominal free fluid.  IVC:  No abnormality visualized.  Pancreas:  Visualized portion unremarkable.  Spleen:  Size and appearance within normal limits.  Right Kidney:  Length: 10 cm. Echogenicity within normal limits. No mass or hydronephrosis visualized.  Left Kidney:  Length: 10.2 cm. Echogenicity within normal limits. No mass or hydronephrosis visualized.  Abdominal aorta:  No aneurysm visualized.  Other findings:  None.  IMPRESSION: 1. No cholelithiasis or sonographic evidence of acute cholecystitis.  2. No obstructive uropathy.  3. No abdominal free fluid.   Electronically Signed   By: Kathreen Devoid   On: 06/19/2014 10:36   Ct Abdomen Pelvis W Contrast  06/19/2014   CLINICAL DATA:  Diffuse abdominal pain, weight loss  EXAM: CT ABDOMEN AND PELVIS WITH CONTRAST  TECHNIQUE:  Multidetector CT imaging of the abdomen and pelvis was performed using the standard protocol following bolus administration of intravenous contrast.  CONTRAST:  166mL OMNIPAQUE IOHEXOL 300 MG/ML  SOLN  COMPARISON:  None.  FINDINGS: Sagittal images of the spine shows diffuse osteopenia. Disc space flattening with mild posterior spurring and vacuum disc phenomenon noted at L3-L4 level. Significant disc space flattening with endplate sclerotic changes and vacuum disc phenomenon at L5-S1 level. Mild compression deformity upper endplate of L1 vertebral body of indeterminate age. Emphysematous changes/bulla noted right base anteriorly.  There is moderate size hiatal hernia. A hemangioma in left hepatic dome measures 3.3 cm. Second hemangioma in lateral left hepatic lobe measures 3 cm. No calcified gallstones are noted within gallbladder.  Atherosclerotic calcifications and atherosclerotic plaques noted abdominal aorta and iliac arteries. A posterior wall somewhat exophytic atherosclerotic plaque noted in  distal abdominal aorta axial image 34 and sagittal image 57. Measures about 1 cm. No evidence of aortic leak or obstruction.  There is a distal duodenal diverticulum just anterior to abdominal aorta measures about 2.4 cm.  The pancreas, spleen and adrenal glands are unremarkable. Kidneys are symmetrical in size and enhancement. No hydronephrosis or hydroureter.  Delayed renal images shows bilateral renal symmetrical excretion.  No small bowel obstruction.  No ascites or free air.  No adenopathy.  There are diverticula in right colon. Minimal thickening of colonic wall in the region of colonic diverticula probable due to colonic diverticulosis. No definite evidence of acute diverticulitis. A normal appendix is clearly visualized in axial image 46. The transverse colon was is empty partially collapsed. No colonic obstruction. Scattered diverticula are noted descending colon. No evidence of acute diverticulitis. There is  some gas noted in sigmoid colon. Moderate stool noted within rectum. The rectum measures 5.9 cm in diameter suspicious for mild fecal impaction. Moderate distended urinary bladder. No bladder filling defects. Prostate gland measures 5.1 x 3.3 cm. Bilateral inguinal scrotal canal small hernia containing fat without evidence of acute complication. No destructive bony lesions are noted within pelvis.  IMPRESSION: 1. Moderate size hiatal hernia is noted. 2. Hemangiomas noted in left hepatic lobe the largest measures 3.3 cm. 3. No hydronephrosis or hydroureter. 4. Extensive atherosclerotic plaques and calcifications of abdominal aorta and iliac arteries. Somewhat exophytic posterior wall atherosclerotic plaque distal abdominal aorta measures about 1 cm. No evidence of leak or aortic obstruction. 5. Normal appendix. 6. Right colon diverticula are noted. Mild thickening of right colonic wall probable due to chronic diverticulosis. No definite evidence of acute diverticulitis. 7. Moderate stool noted within rectum. Mild fecal impaction cannot be excluded. 8. Mild enlarged prostate gland. 9. Moderate distended urinary bladder. 10. A distal duodenal diverticulum measures about 2.4 Cm without evidence of acute complication.   Electronically Signed   By: Lahoma Crocker M.D.   On: 06/19/2014 13:42    Assessment & Plan:   Meredith was seen today for nausea and gastrophageal reflux.  Diagnoses and all orders for this visit:  Other chest pain Orders: -     EKG 12-Lead  Nausea without vomiting Orders: -     CMP14+EGFR -     Lipid panel -     TSH -     T4, Free -     POCT glycosylated hemoglobin (Hb A1C) -     EKG 12-Lead  Pre-syncope Orders: -     CMP14+EGFR -     Lipid panel -     TSH -     T4, Free -     POCT glycosylated hemoglobin (Hb A1C) -     EKG 12-Lead  Prediabetes  Gastroesophageal reflux disease with hiatal hernia  Essential hypertension  BPH with obstruction/lower urinary tract  symptoms  Other orders -     valsartan (DIOVAN) 160 MG tablet; Take 1 tablet (160 mg total) by mouth daily. -     escitalopram (LEXAPRO) 5 MG tablet; Take 1 tablet (5 mg total) by mouth daily. -     lansoprazole (PREVACID) 30 MG capsule; For reflux   I have discontinued Mr. Martinek citalopram, hydrochlorothiazide, omeprazole, losartan, azithromycin, and alfuzosin. I am also having him start on valsartan, escitalopram, and lansoprazole. Additionally, I am having him maintain his diclofenac, aspirin EC, ondansetron, albuterol, montelukast, ALPRAZolam, pravastatin, and levothyroxine.  Meds ordered this encounter  Medications  . DISCONTD: alfuzosin (UROXATRAL) 10 MG 24  hr tablet    Sig: Take 10 mg by mouth daily with breakfast.  . valsartan (DIOVAN) 160 MG tablet    Sig: Take 1 tablet (160 mg total) by mouth daily.    Dispense:  30 tablet    Refill:  2  . escitalopram (LEXAPRO) 5 MG tablet    Sig: Take 1 tablet (5 mg total) by mouth daily.    Dispense:  30 tablet    Refill:  2  . lansoprazole (PREVACID) 30 MG capsule    Sig: For reflux    Dispense:  30 capsule    Refill:  5    Comments: I believe the spells that he is experiencing her combination of side effects of his HCTZ, citalopram, alfuzosin each of those medications has been replaced with choices less likely to cause side effects with the exception of the Uroxatral which will be replaced at his follow-up if needed in 2 weeks. Hopefully just discontinuing the HCTZ will relieve his urinary frequency so that he will not need an alpha blocker.  Follow-up: Return in about 2 weeks (around 02/05/2015) for hypertension, Depression, URinary retention.  [name], M.D.

## 2015-01-23 LAB — CMP14+EGFR
ALBUMIN: 4.4 g/dL (ref 3.5–4.7)
ALT: 12 IU/L (ref 0–44)
AST: 15 IU/L (ref 0–40)
Albumin/Globulin Ratio: 1.9 (ref 1.1–2.5)
Alkaline Phosphatase: 76 IU/L (ref 39–117)
BILIRUBIN TOTAL: 0.5 mg/dL (ref 0.0–1.2)
BUN/Creatinine Ratio: 13 (ref 10–22)
BUN: 25 mg/dL (ref 8–27)
CHLORIDE: 94 mmol/L — AB (ref 97–108)
CO2: 24 mmol/L (ref 18–29)
CREATININE: 1.94 mg/dL — AB (ref 0.76–1.27)
Calcium: 9.7 mg/dL (ref 8.6–10.2)
GFR calc non Af Amer: 30 mL/min/{1.73_m2} — ABNORMAL LOW (ref >59)
GFR, EST AFRICAN AMERICAN: 34 mL/min/{1.73_m2} — AB (ref >59)
GLOBULIN, TOTAL: 2.3 g/dL (ref 1.5–4.5)
Glucose: 94 mg/dL (ref 65–99)
Potassium: 5.1 mmol/L (ref 3.5–5.2)
Sodium: 133 mmol/L — ABNORMAL LOW (ref 134–144)
Total Protein: 6.7 g/dL (ref 6.0–8.5)

## 2015-01-23 LAB — LIPID PANEL
Chol/HDL Ratio: 3.3 ratio units (ref 0.0–5.0)
Cholesterol, Total: 180 mg/dL (ref 100–199)
HDL: 54 mg/dL (ref >39)
LDL Calculated: 87 mg/dL (ref 0–99)
Triglycerides: 194 mg/dL — ABNORMAL HIGH (ref 0–149)
VLDL Cholesterol Cal: 39 mg/dL (ref 5–40)

## 2015-01-23 LAB — TSH: TSH: 4.17 u[IU]/mL (ref 0.450–4.500)

## 2015-01-23 LAB — T4, FREE: FREE T4: 1.9 ng/dL — AB (ref 0.82–1.77)

## 2015-01-28 ENCOUNTER — Telehealth: Payer: Self-pay | Admitting: Family Medicine

## 2015-01-28 NOTE — Addendum Note (Signed)
Addended by: Thana Ates on: 01/28/2015 09:53 AM   Modules accepted: Orders

## 2015-01-29 ENCOUNTER — Other Ambulatory Visit (INDEPENDENT_AMBULATORY_CARE_PROVIDER_SITE_OTHER): Payer: Medicare Other

## 2015-01-29 DIAGNOSIS — R7989 Other specified abnormal findings of blood chemistry: Secondary | ICD-10-CM

## 2015-01-29 DIAGNOSIS — R748 Abnormal levels of other serum enzymes: Secondary | ICD-10-CM

## 2015-01-29 NOTE — Progress Notes (Signed)
Lab only 

## 2015-01-29 NOTE — Telephone Encounter (Signed)
Pt came in this morning for the lab and we reviewed lab results and discussed not taking diclofenac. Pt voiced understanding.

## 2015-01-30 ENCOUNTER — Telehealth: Payer: Self-pay | Admitting: *Deleted

## 2015-01-30 LAB — CMP14+EGFR
ALT: 13 IU/L (ref 0–44)
AST: 13 IU/L (ref 0–40)
Albumin/Globulin Ratio: 2.6 — ABNORMAL HIGH (ref 1.1–2.5)
Albumin: 4.4 g/dL (ref 3.5–4.7)
Alkaline Phosphatase: 69 IU/L (ref 39–117)
BILIRUBIN TOTAL: 0.4 mg/dL (ref 0.0–1.2)
BUN / CREAT RATIO: 16 (ref 10–22)
BUN: 13 mg/dL (ref 8–27)
CALCIUM: 9.2 mg/dL (ref 8.6–10.2)
CHLORIDE: 96 mmol/L — AB (ref 97–108)
CO2: 22 mmol/L (ref 18–29)
CREATININE: 0.83 mg/dL (ref 0.76–1.27)
GFR calc Af Amer: 90 mL/min/{1.73_m2} (ref >59)
GFR calc non Af Amer: 78 mL/min/{1.73_m2} (ref >59)
Globulin, Total: 1.7 g/dL (ref 1.5–4.5)
Glucose: 112 mg/dL — ABNORMAL HIGH (ref 65–99)
POTASSIUM: 4.6 mmol/L (ref 3.5–5.2)
SODIUM: 135 mmol/L (ref 134–144)
TOTAL PROTEIN: 6.1 g/dL (ref 6.0–8.5)

## 2015-01-30 MED ORDER — PANTOPRAZOLE SODIUM 40 MG PO TBEC: 40.0000 mg | DELAYED_RELEASE_TABLET | Freq: Every day | ORAL | Status: DC

## 2015-01-30 NOTE — Telephone Encounter (Signed)
Pt needed prior authorization for Prevacid Per Dr Livia Snellen change to Pantoprazole RX sent into pharmacy Pt notified

## 2015-02-05 ENCOUNTER — Ambulatory Visit (INDEPENDENT_AMBULATORY_CARE_PROVIDER_SITE_OTHER): Payer: Medicare Other | Admitting: Family Medicine

## 2015-02-05 ENCOUNTER — Encounter: Payer: Self-pay | Admitting: Family Medicine

## 2015-02-05 VITALS — BP 143/64 | HR 83 | Temp 97.0°F | Ht 67.0 in | Wt 136.8 lb

## 2015-02-05 DIAGNOSIS — K2100 Gastro-esophageal reflux disease with esophagitis, without bleeding: Secondary | ICD-10-CM

## 2015-02-05 DIAGNOSIS — I10 Essential (primary) hypertension: Secondary | ICD-10-CM | POA: Diagnosis not present

## 2015-02-05 MED ORDER — PANTOPRAZOLE SODIUM 40 MG PO TBEC: 80.0000 mg | DELAYED_RELEASE_TABLET | Freq: Every day | ORAL | Status: DC

## 2015-02-05 NOTE — Progress Notes (Signed)
Subjective:  Patient ID: Albert Allen, male    DOB: 1923/02/11  Age: 81 y.o. MRN: [id number]  CC: Hypertension and Gastrophageal Reflux   HPI Albert Allen presents for  follow-up of hypertension. Patient has no history of headache chest pain or shortness of breath or recent cough. Patient also denies symptoms of TIA such as numbness weakness lateralizing. Patient checks  blood pressure at home and has not had any elevated readings recently. Patient denies side effects from his medication. States taking it regularly.  Patient in for follow-up of GERD. Currently asymptomatic taking  PPI daily. There is no chest pain or heartburn. No hematemesis and no melena. No dysphagia or choking. Onset is remote. Progression is stable. Complicating factors, none. Patient states that the lansoprazole was nonformulary and he would like to switch back to omeprazole. However he continues to have some nausea.     History Albert Allen has a past medical history of HYPOTHYROIDISM (05/31/2007); HYPERLIPIDEMIA (03/27/2009); GOUT (05/31/2007); ANEMIA, B12 DEFICIENCY (03/29/2008); Anxiety state, unspecified (11/19/2008); ABUSE, ALCOHOL, IN REMISSION (05/31/2007); CARPAL TUNNEL SYNDROME, RIGHT (09/05/2007); Alcoholic polyneuropathy (04/07/3266); HYPERTENSION (05/31/2007); CAD, NATIVE VESSEL (04/04/2009); LEFT BUNDLE BRANCH BLOCK (02/11/2009); SINUSITIS, CHRONIC NOS (05/31/2007); ASTHMA (05/31/2007); GERD (05/31/2007); Chronic prostatitis (03/29/2008); LOC OSTEOARTHROS NOT SPEC PRIM/SEC LOWER LEG (03/20/2010); OSTEOARTHRITIS (05/31/2007); HIP PAIN (03/20/2010); DEGENERATIVE DISC DISEASE, CERVICAL SPINE (06/22/2007); Adhesive capsulitis of shoulder (12/29/2007); SYNCOPE (11/19/2008); WEIGHT LOSS, RECENT (11/19/2008); DYSPHAGIA UNSPECIFIED (09/25/2008); Hepatomegaly (09/25/2008); PSA, INCREASED (03/20/2010); ABNORMAL CV (STRESS) TEST (03/05/2009); PERSONAL HX COLONIC POLYPS (03/05/1997); Liver mass, left lobe; Hiatal hernia; and Esophageal stricture.   He  has past surgical history that includes pul. colapse w/chest tube; Tonsillectomy; carpel tunnel release; and ORIF ulnar fracture (11/03/2012).   His family history includes COPD in his father; Cancer in his mother. There is no history of Colon cancer.He reports that he quit smoking about 9 years ago. His smoking use included Cigarettes. He does not have any smokeless tobacco history on file. He reports that he does not drink alcohol or use illicit drugs.  Current Outpatient Prescriptions on File Prior to Visit  Medication Sig Dispense Refill  . albuterol (PROVENTIL HFA;VENTOLIN HFA) 108 (90 BASE) MCG/ACT inhaler Inhale 2 puffs into the lungs every 6 (six) hours as needed for wheezing or shortness of breath. 3.7 g 11  . ALPRAZolam (XANAX) 0.25 MG tablet Take 1 tablet (0.25 mg total) by mouth every 6 (six) hours as needed for sleep. 60 tablet 3  . aspirin EC 81 MG tablet Take 81 mg by mouth daily.    . diclofenac (VOLTAREN) 75 MG EC tablet Take 75 mg by mouth every evening. Prn    . escitalopram (LEXAPRO) 5 MG tablet Take 1 tablet (5 mg total) by mouth daily. 30 tablet 2  . levothyroxine (SYNTHROID, LEVOTHROID) 75 MCG tablet Take 1 tablet (75 mcg total) by mouth daily. 90 tablet 3  . montelukast (SINGULAIR) 10 MG tablet Take 10 mg by mouth at bedtime.    . ondansetron (ZOFRAN ODT) 8 MG disintegrating tablet Take 1 tablet (8 mg total) by mouth every 8 (eight) hours as needed for nausea or vomiting. 30 tablet 2  . pravastatin (PRAVACHOL) 40 MG tablet Take 1 tablet (40 mg total) by mouth daily. 90 tablet 3  . valsartan (DIOVAN) 160 MG tablet Take 1 tablet (160 mg total) by mouth daily. 30 tablet 2   No current facility-administered medications on file prior to visit.    ROS Review of Systems  Constitutional: Negative for fever,  chills, diaphoresis and unexpected weight change.  HENT: Negative for congestion, hearing loss, rhinorrhea, sore throat and trouble swallowing.   Respiratory: Negative for  cough, chest tightness, shortness of breath and wheezing.   Gastrointestinal: Negative for nausea, vomiting, abdominal pain, diarrhea, constipation and abdominal distention.  Endocrine: Negative for cold intolerance and heat intolerance.  Genitourinary: Negative for dysuria, hematuria and flank pain.  Musculoskeletal: Negative for joint swelling and arthralgias.  Skin: Negative for rash.  Neurological: Negative for dizziness and headaches.  Psychiatric/Behavioral: Negative for dysphoric mood, decreased concentration and agitation. The patient is not nervous/anxious.     Objective:  BP 143/64 mmHg  Pulse 83  Temp(Src) 97 F (36.1 C) (Oral)  Ht 5\' 7"  (1.702 m)  Wt 136 lb 12.8 oz (62.052 kg)  BMI 21.42 kg/m2  BP Readings from Last 3 Encounters:  02/05/15 143/64  01/22/15 165/67  01/08/15 170/84    Wt Readings from Last 3 Encounters:  02/05/15 136 lb 12.8 oz (62.052 kg)  01/22/15 135 lb 12.8 oz (61.598 kg)  01/08/15 139 lb 9.6 oz (63.322 kg)     Physical Exam  Constitutional: He is oriented to person, place, and time. He appears well-developed and well-nourished. No distress.  HENT:  Head: Normocephalic and atraumatic.  Right Ear: External ear normal.  Left Ear: External ear normal.  Nose: Nose normal.  Mouth/Throat: Oropharynx is clear and moist.  Eyes: Conjunctivae and EOM are normal. Pupils are equal, round, and reactive to light.  Neck: Normal range of motion. Neck supple. No thyromegaly present.  Cardiovascular: Normal rate, regular rhythm and normal heart sounds.   No murmur heard. Pulmonary/Chest: Effort normal and breath sounds normal. No respiratory distress. He has no wheezes. He has no rales.  Abdominal: Soft. Bowel sounds are normal. He exhibits no distension. There is no tenderness.  Lymphadenopathy:    He has no cervical adenopathy.  Neurological: He is alert and oriented to person, place, and time. He has normal reflexes.  Skin: Skin is warm and dry.    Psychiatric: He has a normal mood and affect. His behavior is normal. Judgment and thought content normal.    Lab Results  Component Value Date   HGBA1C 6.1% 01/22/2015   HGBA1C 6.4* 11/19/2008    Lab Results  Component Value Date   WBC 3.8* 09/14/2014   HGB 13.6 09/14/2014   HCT 40.8 09/14/2014   PLT 231.0 09/14/2014   GLUCOSE 112* 01/29/2015   CHOL 180 01/22/2015   TRIG 194* 01/22/2015   HDL 54 01/22/2015   LDLDIRECT 67.7 01/12/2012   LDLCALC 87 01/22/2015   ALT 13 01/29/2015   AST 13 01/29/2015   NA 135 01/29/2015   K 4.6 01/29/2015   CL 96* 01/29/2015   CREATININE 0.83 01/29/2015   BUN 13 01/29/2015   CO2 22 01/29/2015   TSH 4.170 01/22/2015   PSA 1.42 01/12/2012   INR 0.97 11/03/2012   HGBA1C 6.1% 01/22/2015    Dg Chest 2 View  06/19/2014   CLINICAL DATA:  Weakness and abdominal pain and nausea  EXAM: CHEST  2 VIEW  COMPARISON:  PA and lateral chest of May 15, 2013  FINDINGS: The lungs remain hyperinflated. There are coarse right infrahilar lung markings not significantly changed from the previous study. There is no pleural effusion. The heart and pulmonary vascularity are normal. There is no free subdiaphragmatic gas collection. There is stable mild anterior wedging of approximately T8.  IMPRESSION: COPD. There is no pneumonia nor CHF nor other  acute cardiopulmonary abnormality.   Electronically Signed   By: David  Martinique   On: 06/19/2014 10:15   US Abdomen Complete  06/19/2014   CLINICAL DATA:  Right upper quadrant pain  EXAM: ULTRASOUND ABDOMEN COMPLETE  COMPARISON:  CT abdomen 04/21/2005 ; abdominal ultrasound 09/28/2008.  FINDINGS: Gallbladder:  No gallstones or wall thickening visualized. No sonographic Murphy sign noted.  Common bile duct:  Diameter: 6 mm  Liver:  There are 2 hyperechoic left hepatic masses measuring 2.1 x 2.4 x 2.3 cm and 2.1 x 2.3 x 2.1 cm respectively most consistent with a hemangioma are unchanged in size compared with prior CT of the abdomen  dated 04/21/2005. Within normal limits in parenchymal echogenicity. There is no abdominal free fluid.  IVC:  No abnormality visualized.  Pancreas:  Visualized portion unremarkable.  Spleen:  Size and appearance within normal limits.  Right Kidney:  Length: 10 cm. Echogenicity within normal limits. No mass or hydronephrosis visualized.  Left Kidney:  Length: 10.2 cm. Echogenicity within normal limits. No mass or hydronephrosis visualized.  Abdominal aorta:  No aneurysm visualized.  Other findings:  None.  IMPRESSION: 1. No cholelithiasis or sonographic evidence of acute cholecystitis.  2. No obstructive uropathy.  3. No abdominal free fluid.   Electronically Signed   By: Kathreen Devoid   On: 06/19/2014 10:36   Ct Abdomen Pelvis W Contrast  06/19/2014   CLINICAL DATA:  Diffuse abdominal pain, weight loss  EXAM: CT ABDOMEN AND PELVIS WITH CONTRAST  TECHNIQUE: Multidetector CT imaging of the abdomen and pelvis was performed using the standard protocol following bolus administration of intravenous contrast.  CONTRAST:  152mL OMNIPAQUE IOHEXOL 300 MG/ML  SOLN  COMPARISON:  None.  FINDINGS: Sagittal images of the spine shows diffuse osteopenia. Disc space flattening with mild posterior spurring and vacuum disc phenomenon noted at L3-L4 level. Significant disc space flattening with endplate sclerotic changes and vacuum disc phenomenon at L5-S1 level. Mild compression deformity upper endplate of L1 vertebral body of indeterminate age. Emphysematous changes/bulla noted right base anteriorly.  There is moderate size hiatal hernia. A hemangioma in left hepatic dome measures 3.3 cm. Second hemangioma in lateral left hepatic lobe measures 3 cm. No calcified gallstones are noted within gallbladder.  Atherosclerotic calcifications and atherosclerotic plaques noted abdominal aorta and iliac arteries. A posterior wall somewhat exophytic atherosclerotic plaque noted in distal abdominal aorta axial image 34 and sagittal image 57.  Measures about 1 cm. No evidence of aortic leak or obstruction.  There is a distal duodenal diverticulum just anterior to abdominal aorta measures about 2.4 cm.  The pancreas, spleen and adrenal glands are unremarkable. Kidneys are symmetrical in size and enhancement. No hydronephrosis or hydroureter.  Delayed renal images shows bilateral renal symmetrical excretion.  No small bowel obstruction.  No ascites or free air.  No adenopathy.  There are diverticula in right colon. Minimal thickening of colonic wall in the region of colonic diverticula probable due to colonic diverticulosis. No definite evidence of acute diverticulitis. A normal appendix is clearly visualized in axial image 46. The transverse colon was is empty partially collapsed. No colonic obstruction. Scattered diverticula are noted descending colon. No evidence of acute diverticulitis. There is some gas noted in sigmoid colon. Moderate stool noted within rectum. The rectum measures 5.9 cm in diameter suspicious for mild fecal impaction. Moderate distended urinary bladder. No bladder filling defects. Prostate gland measures 5.1 x 3.3 cm. Bilateral inguinal scrotal canal small hernia containing fat without evidence of acute complication.  No destructive bony lesions are noted within pelvis.  IMPRESSION: 1. Moderate size hiatal hernia is noted. 2. Hemangiomas noted in left hepatic lobe the largest measures 3.3 cm. 3. No hydronephrosis or hydroureter. 4. Extensive atherosclerotic plaques and calcifications of abdominal aorta and iliac arteries. Somewhat exophytic posterior wall atherosclerotic plaque distal abdominal aorta measures about 1 cm. No evidence of leak or aortic obstruction. 5. Normal appendix. 6. Right colon diverticula are noted. Mild thickening of right colonic wall probable due to chronic diverticulosis. No definite evidence of acute diverticulitis. 7. Moderate stool noted within rectum. Mild fecal impaction cannot be excluded. 8. Mild  enlarged prostate gland. 9. Moderate distended urinary bladder. 10. A distal duodenal diverticulum measures about 2.4 Cm without evidence of acute complication.   Electronically Signed   By: Lahoma Crocker M.D.   On: 06/19/2014 13:42    Assessment & Plan:   Jacquelyn was seen today for hypertension and gastrophageal reflux.  Diagnoses and all orders for this visit:  Essential hypertension  Gastroesophageal reflux disease with esophagitis  Other orders -     pantoprazole (PROTONIX) 40 MG tablet; Take 2 tablets (80 mg total) by mouth daily.  I have discontinued Mr. Zaremba lansoprazole. I have also changed his pantoprazole. Additionally, I am having him maintain his diclofenac, aspirin EC, ondansetron, albuterol, montelukast, ALPRAZolam, pravastatin, levothyroxine, valsartan, and escitalopram.  Meds ordered this encounter  Medications  . pantoprazole (PROTONIX) 40 MG tablet    Sig: Take 2 tablets (80 mg total) by mouth daily.    Dispense:  60 tablet    Refill:  11     Follow-up: Return in about 6 weeks (around 03/19/2015).  [name], M.D.

## 2015-02-15 ENCOUNTER — Other Ambulatory Visit: Payer: Self-pay | Admitting: *Deleted

## 2015-02-15 MED ORDER — VALSARTAN 160 MG PO TABS: 160.0000 mg | ORAL_TABLET | Freq: Every day | ORAL | Status: DC

## 2015-02-15 NOTE — Progress Notes (Signed)
[facility] requests 90 day refill on Valsartan New RX sent into [facility] per Dr Livia Snellen

## 2015-02-19 ENCOUNTER — Encounter: Payer: Self-pay | Admitting: Cardiovascular Disease

## 2015-02-19 ENCOUNTER — Ambulatory Visit (INDEPENDENT_AMBULATORY_CARE_PROVIDER_SITE_OTHER): Payer: Medicare Other | Admitting: Cardiovascular Disease

## 2015-02-19 VITALS — BP 150/64 | HR 84 | Ht 67.0 in | Wt 138.0 lb

## 2015-02-19 DIAGNOSIS — I2510 Atherosclerotic heart disease of native coronary artery without angina pectoris: Secondary | ICD-10-CM

## 2015-02-19 DIAGNOSIS — R42 Dizziness and giddiness: Secondary | ICD-10-CM

## 2015-02-19 DIAGNOSIS — I10 Essential (primary) hypertension: Secondary | ICD-10-CM

## 2015-02-19 NOTE — Patient Instructions (Signed)
Your physician wants you to follow-up in:  6 months. You will receive a reminder letter in the mail two months in advance. If you don't receive a letter, please call our office to schedule the follow-up appointment.   

## 2015-02-19 NOTE — Progress Notes (Signed)
CC: follow up CAD, fatigue, sweating, dizziness  History of Present Illness: 81 yo WM with past medical history significant for CAD, HTN, anemia, asthma/COPD, GERD, OA, gout hypothyroidism, hiatal hernia and alcohol abuse seen as a new patient 03/05/09 for evaluation of atypical chest pain, new LBBB and abnormal Myoview. Cardiac cath was performed on 03/13/09 and we found a 70-80% heavily calcified mid LAD that was severely diseased throughout the mid section with heavy calcification. There was diffuse moderately obstructive disease in the other vessels. I discussed his disease with him in the hospital and we elected for medical management given that he was asymptomatic. No stents were placed. Vascular screening at [facility] October 2015 with mild bilateral carotid artery disease, normal ABI.  Echo 2014 showed normal LV size and function with mild AI and mild MR. Head CT without acute changes. Carotid artery dopplers June 2014 with mild bilateral disease. He has refused stress testing.   He is here today for follow up. He has no exertional chest pains. His breathing has been ok. He continues to have some dizziness and fatigue but much improved off of Metoprolol.  Primary Care Physician: Stacks  Last Lipid Profile:Lipid Panel     Component Value Date/Time   CHOL 180 01/22/2015 0900             TRIG 194* 01/22/2015 0900        HDL 54 01/22/2015 0900                            LDLCALC 87 01/22/2015 0900               Past Medical History  Diagnosis Date  . HYPOTHYROIDISM 05/31/2007  . HYPERLIPIDEMIA 03/27/2009  . GOUT 05/31/2007  . ANEMIA, B12 DEFICIENCY 03/29/2008  . Anxiety state, unspecified 11/19/2008  . ABUSE, ALCOHOL, IN REMISSION 05/31/2007  . CARPAL TUNNEL SYNDROME, RIGHT 09/05/2007  . Alcoholic polyneuropathy 4/97/0263  . HYPERTENSION 05/31/2007  . CAD, NATIVE VESSEL 04/04/2009  . LEFT BUNDLE BRANCH BLOCK 02/11/2009  . SINUSITIS, CHRONIC NOS 05/31/2007  . ASTHMA 05/31/2007  .  GERD 05/31/2007  . Chronic prostatitis 03/29/2008  . LOC OSTEOARTHROS NOT SPEC PRIM/SEC LOWER LEG 03/20/2010  . OSTEOARTHRITIS 05/31/2007  . HIP PAIN 03/20/2010  . DEGENERATIVE DISC DISEASE, CERVICAL SPINE 06/22/2007  . Adhesive capsulitis of shoulder 12/29/2007  . SYNCOPE 11/19/2008  . WEIGHT LOSS, RECENT 11/19/2008  . DYSPHAGIA UNSPECIFIED 09/25/2008  . Hepatomegaly 09/25/2008  . PSA, INCREASED 03/20/2010  . ABNORMAL CV (STRESS) TEST 03/05/2009  . PERSONAL HX COLONIC POLYPS 03/05/1997    adenomatous   . Liver mass, left lobe   . Hiatal hernia   . Esophageal stricture     Past Surgical History  Procedure Laterality Date  . Pul. colapse w/chest tube    . Tonsillectomy    . Carpel tunnel release    . Orif ulnar fracture  11/03/2012    Procedure: OPEN REDUCTION INTERNAL FIXATION (ORIF) ULNAR FRACTURE;  Surgeon: Hessie Dibble, MD;  Location: [facility];  Service: Orthopedics;  Laterality: Right;  ORIF radius and ulnar fractures    Current Outpatient Prescriptions  Medication Sig Dispense Refill  . albuterol (PROVENTIL HFA;VENTOLIN HFA) 108 (90 BASE) MCG/ACT inhaler Inhale 2 puffs into the lungs every 6 (six) hours as needed for wheezing or shortness of breath. 3.7 g 11  . ALPRAZolam (XANAX) 0.25 MG tablet Take 1 tablet (0.25 mg total) by  mouth every 6 (six) hours as needed for sleep. 60 tablet 3  . aspirin EC 81 MG tablet Take 81 mg by mouth daily.    . diclofenac (VOLTAREN) 75 MG EC tablet Take 75 mg by mouth every evening. Prn    . escitalopram (LEXAPRO) 5 MG tablet Take 1 tablet (5 mg total) by mouth daily. 30 tablet 2  . levothyroxine (SYNTHROID, LEVOTHROID) 75 MCG tablet Take 1 tablet (75 mcg total) by mouth daily. 90 tablet 3  . montelukast (SINGULAIR) 10 MG tablet Take 10 mg by mouth at bedtime.    . ondansetron (ZOFRAN ODT) 8 MG disintegrating tablet Take 1 tablet (8 mg total) by mouth every 8 (eight) hours as needed for nausea or vomiting. 30 tablet 2  . pantoprazole (PROTONIX) 40 MG  tablet Take 2 tablets (80 mg total) by mouth daily. 60 tablet 11  . pravastatin (PRAVACHOL) 40 MG tablet Take 1 tablet (40 mg total) by mouth daily. 90 tablet 3  . valsartan (DIOVAN) 160 MG tablet Take 1 tablet (160 mg total) by mouth daily. 90 tablet 2   No current facility-administered medications for this visit.    Allergies  Allergen Reactions  . Penicillins Swelling    LIPS  . Amoxicillin Other (See Comments)    REACTION: unspecified  . Ceclor [Cefaclor] Other (See Comments)    Reaction: unspecified  . Celebrex [Celecoxib] Other (See Comments)    Reaction: Unspecified  . Levaquin [Levofloxacin In D5w] Other (See Comments)    Pt does not remember    History   Social History  . Marital Status: Married    Spouse Name: N/A  . Number of Children: N/A  . Years of Education: N/A   Occupational History  . retired    Social History Main Topics  . Smoking status: Former Smoker    Types: Cigarettes    Quit date: 12/27/2005  . Smokeless tobacco: Not on file  . Alcohol Use: No  . Drug Use: No  . Sexual Activity: Not Currently   Other Topics Concern  . Not on file   Social History Narrative    Family History  Problem Relation Age of Onset  . Cancer Mother     In back but unsure of what type of cancer  . COPD Father   . Colon cancer Neg Hx     Review of Systems:  As stated in the HPI and otherwise negative.   BP 150/64 mmHg  Pulse 84  Ht 5\' 7"  (1.702 m)  Wt 138 lb (62.596 kg)  BMI 21.61 kg/m2  Physical Examination: General: Well developed, well nourished, NAD HEENT: OP clear, mucus membranes moist SKIN: warm, dry. No rashes. Neuro: No focal deficits Musculoskeletal: Muscle strength 5/5 all ext Psychiatric: Mood and affect normal Neck: No JVD, no carotid bruits, no thyromegaly, no lymphadenopathy. Lungs:Clear bilaterally, no wheezes, rhonci, crackles Cardiovascular: Regular rate and rhythm. No murmurs, gallops or rubs. Abdomen:Soft. Bowel sounds present.  Non-tender.  Extremities: No lower extremity edema. Pulses are 2 + in the bilateral DP/PT.  Echo 04/25/13: Left ventricle: The cavity size was normal. Wall thickness was increased in a pattern of mild LVH. Systolic function was normal. The estimated ejection fraction was in the range of 55% to 65%. Wall motion was normal; there were no regional wall motion abnormalities. Doppler parameters are consistent with abnormal left ventricular relaxation (grade 1 diastolic dysfunction). - Aortic valve: Mild regurgitation. - Mitral valve: Calcified annulus. Mild regurgitation.  EKG from primary care  February 2015 reviewed by me today shows sinus with PVCs, 1st degree AV block  Assessment and Plan:   1. CAD: Stable. He has no chest pain or SOB. Continue ASA, statin. No beta blocker due to bradycardia. I have discussed arranging a stress test to exclude ischemia with known LAD disease. He continues to refuse stress testing and at his age I do not disagree with this.    2. Dizziness: Improved off of metoprolol.   3. HTN: Slight elevation today but controlled at home. Continue Diovan 160 mg daily.

## 2015-03-15 ENCOUNTER — Other Ambulatory Visit: Payer: Self-pay | Admitting: *Deleted

## 2015-03-15 MED ORDER — MONTELUKAST SODIUM 10 MG PO TABS: 10.0000 mg | ORAL_TABLET | Freq: Every day | ORAL | Status: DC

## 2015-03-19 ENCOUNTER — Ambulatory Visit (INDEPENDENT_AMBULATORY_CARE_PROVIDER_SITE_OTHER): Payer: Medicare Other | Admitting: Family Medicine

## 2015-03-19 ENCOUNTER — Encounter: Payer: Self-pay | Admitting: Family Medicine

## 2015-03-19 VITALS — BP 159/73 | HR 72 | Temp 96.9°F | Ht 67.0 in | Wt 141.0 lb

## 2015-03-19 DIAGNOSIS — I10 Essential (primary) hypertension: Secondary | ICD-10-CM | POA: Diagnosis not present

## 2015-03-19 DIAGNOSIS — K2100 Gastro-esophageal reflux disease with esophagitis, without bleeding: Secondary | ICD-10-CM

## 2015-03-19 DIAGNOSIS — G4700 Insomnia, unspecified: Secondary | ICD-10-CM | POA: Diagnosis not present

## 2015-03-19 NOTE — Patient Instructions (Signed)
Use the diclofenac as rarely as possible.

## 2015-03-19 NOTE — Progress Notes (Signed)
Subjective:    Patient ID: Albert Allen, male    DOB: 1923-11-07, 81 y.o.   MRN: [id number]  HPI  Sleeping 2-5 hours at a time. Sleeping in afternoon too. Increasing dysphagia and reflux but nausea is gone. Forgetting the evening dose of his Protonix a lot.  Chief Complaint  Patient presents with  . Gastrophageal Reflux    follow up on GERD and nausea. Patient reports improvement. He has stopped taking Zofran. He does feel that omeprazole worked better than Protonix.     Patient Active Problem List   Diagnosis Date Noted  . Seasonal allergic rhinitis 04/17/2013  . BPH (benign prostatic hyperplasia) 04/17/2013  . Diverticulosis of colon (without mention of hemorrhage) 06/15/2011  . Hernia, hiatal 06/15/2011  . Diverticulum of duodenum 06/15/2011  . PERSONAL HX COLONIC POLYPS 03/25/2010  . PSA, INCREASED 03/20/2010  . CAD, NATIVE VESSEL 04/04/2009  . HYPERLIPIDEMIA 03/27/2009  . LEFT BUNDLE BRANCH BLOCK 02/11/2009  . HEPATOMEGALY 09/25/2008  . ANEMIA, B12 DEFICIENCY 03/29/2008  . ALCOHOLIC POLYNEUROPATHY 73/71/0626  . HYPOTHYROIDISM 05/31/2007  . ABUSE, ALCOHOL, IN REMISSION 05/31/2007  . Essential hypertension 05/31/2007  . ASTHMA 05/31/2007  . GERD 05/31/2007  . OSTEOARTHRITIS 05/31/2007   Outpatient Encounter Prescriptions as of 03/19/2015  Medication Sig  . albuterol (PROVENTIL HFA;VENTOLIN HFA) 108 (90 BASE) MCG/ACT inhaler Inhale 2 puffs into the lungs every 6 (six) hours as needed for wheezing or shortness of breath.  . ALPRAZolam (XANAX) 0.25 MG tablet Take 1 tablet (0.25 mg total) by mouth every 6 (six) hours as needed for sleep.  Marland Kitchen aspirin EC 81 MG tablet Take 81 mg by mouth daily.  . diclofenac (VOLTAREN) 75 MG EC tablet Take 75 mg by mouth every evening. Prn  . escitalopram (LEXAPRO) 5 MG tablet Take 1 tablet (5 mg total) by mouth daily.  Marland Kitchen levothyroxine (SYNTHROID, LEVOTHROID) 75 MCG tablet Take 1 tablet (75 mcg total) by mouth daily.  . montelukast  (SINGULAIR) 10 MG tablet Take 1 tablet (10 mg total) by mouth at bedtime.  . pantoprazole (PROTONIX) 40 MG tablet Take 2 tablets (80 mg total) by mouth daily. (Patient taking differently: Take 40 mg by mouth 2 (two) times daily. )  . pravastatin (PRAVACHOL) 40 MG tablet Take 1 tablet (40 mg total) by mouth daily.  . valsartan (DIOVAN) 160 MG tablet Take 1 tablet (160 mg total) by mouth daily.  . [DISCONTINUED] ondansetron (ZOFRAN ODT) 8 MG disintegrating tablet Take 1 tablet (8 mg total) by mouth every 8 (eight) hours as needed for nausea or vomiting.     Review of Systems  Constitutional: Negative for fever, chills, diaphoresis and unexpected weight change.  HENT: Negative for congestion, hearing loss, rhinorrhea, sore throat and trouble swallowing.   Respiratory: Negative for cough, chest tightness, shortness of breath and wheezing.   Gastrointestinal: Negative for nausea, vomiting, abdominal pain, diarrhea, constipation and abdominal distention.  Endocrine: Negative for cold intolerance and heat intolerance.  Genitourinary: Negative for dysuria, hematuria and flank pain.  Musculoskeletal: Negative for joint swelling and arthralgias.  Skin: Negative for rash.  Neurological: Negative for dizziness and headaches.  Psychiatric/Behavioral: Negative for dysphoric mood, decreased concentration and agitation. The patient is not nervous/anxious.        Objective:   Physical Exam  Constitutional: He is oriented to person, place, and time. He appears well-developed and well-nourished. No distress.  HENT:  Head: Normocephalic and atraumatic.  Right Ear: External ear normal.  Left Ear: External ear normal.  Nose: Nose normal.  Mouth/Throat: Oropharynx is clear and moist.  Eyes: Conjunctivae and EOM are normal. Pupils are equal, round, and reactive to light.  Neck: Normal range of motion. Neck supple. No thyromegaly present.  Cardiovascular: Normal rate, regular rhythm and normal heart sounds.    No murmur heard. Pulmonary/Chest: Effort normal and breath sounds normal. No respiratory distress. He has no wheezes. He has no rales.  Abdominal: Soft. Bowel sounds are normal. He exhibits no distension. There is no tenderness.  Lymphadenopathy:    He has no cervical adenopathy.  Neurological: He is alert and oriented to person, place, and time. He has normal reflexes.  Skin: Skin is warm and dry.  Psychiatric: He has a normal mood and affect. His behavior is normal. Judgment and thought content normal.    BP 159/73 mmHg  Pulse 72  Temp(Src) 96.9 F (36.1 C) (Oral)  Ht 5\' 7"  (1.702 m)  Wt 141 lb (63.957 kg)  BMI 22.08 kg/m2       Assessment & Plan:   1. Gastroesophageal reflux disease with esophagitis   2. Insomnia   3. Essential hypertension     No orders of the defined types were placed in this encounter.    No orders of the defined types were placed in this encounter.    Labs pending Health Maintenance reviewed Diet and exercise encouraged Continue all meds as discussed Follow up in 3 months  [name], MD

## 2015-04-11 ENCOUNTER — Ambulatory Visit (INDEPENDENT_AMBULATORY_CARE_PROVIDER_SITE_OTHER): Payer: Medicare Other

## 2015-04-11 ENCOUNTER — Other Ambulatory Visit: Payer: Self-pay | Admitting: Physician Assistant

## 2015-04-11 ENCOUNTER — Ambulatory Visit (INDEPENDENT_AMBULATORY_CARE_PROVIDER_SITE_OTHER): Payer: Medicare Other | Admitting: Physician Assistant

## 2015-04-11 ENCOUNTER — Encounter: Payer: Self-pay | Admitting: Physician Assistant

## 2015-04-11 VITALS — BP 149/79 | HR 79 | Ht 67.0 in | Wt 142.0 lb

## 2015-04-11 DIAGNOSIS — S61402A Unspecified open wound of left hand, initial encounter: Secondary | ICD-10-CM | POA: Diagnosis not present

## 2015-04-11 DIAGNOSIS — S60222A Contusion of left hand, initial encounter: Secondary | ICD-10-CM

## 2015-04-11 DIAGNOSIS — S6992XA Unspecified injury of left wrist, hand and finger(s), initial encounter: Secondary | ICD-10-CM | POA: Diagnosis not present

## 2015-04-11 DIAGNOSIS — S61412A Laceration without foreign body of left hand, initial encounter: Secondary | ICD-10-CM

## 2015-04-11 DIAGNOSIS — R9389 Abnormal findings on diagnostic imaging of other specified body structures: Secondary | ICD-10-CM

## 2015-04-11 MED ORDER — MUPIROCIN 2 % EX OINT: 1.0000 "application " | TOPICAL_OINTMENT | Freq: Two times a day (BID) | CUTANEOUS | Status: DC

## 2015-04-11 NOTE — Progress Notes (Signed)
   Subjective:    Patient ID: Albert Allen, male    DOB: 02-10-1923, 81 y.o.   MRN: [id number]  HPI 81 y/o male presents with c/o cut on left hand and inflammation s/p accident with weed eater. He was trying to change the cord on the weedeater and the other end slipped back and hit the dorsum of his hand. He put triple antibiotic ointment on it and put gauze around it. Denies pain.     Review of Systems  Skin: Positive for color change (bruising on dorsum of right hand ) and wound (skin tear on left hand, no bleeding. ).       Edema on right hand  All other systems reviewed and are negative.      Objective:   Physical Exam  Skin:  Approximately 5-6 cm tear of epidermis layer of skin on dorsal surface of left hand. 3-4 cm hematoma with surrounding ecchymosis.  Nontender to palpation. Soft mass. Wound is clean with no visible contamination           Assessment & Plan:  1. Hand injury, left, initial encounter  - DG Hand Complete Left; Future  2. Skin tear of left hand without complication, initial encounter  - mupirocin ointment (BACTROBAN) 2 %; Place 1 application into the nose 2 (two) times daily.  Dispense: 22 g; Refill: 0 - clean with saline wash and dress twice daily   3. Traumatic hematoma of hand, left, initial encounter - Apply ice 20 min x 3-5 times dailyx 2 days, then use heat compresses - instructions given.  - Return to clinic if numbness, tingling occur     RTO 4 days   Shalaine Payson A. Benjamin Stain PA-C

## 2015-04-11 NOTE — Patient Instructions (Signed)
Skin Tear Care A skin tear is when the top layer of skin peels off. To repair the skin, your doctor may use:   Tape.  Skin adhesive strips. HOME CARE  Change bandages (dressings) once a day or as told by your doctor.  Gently clean the area with salt (saline) solution or with a mild soap and water.  Do not rub the injured skin dry. Let the area air dry.  Put petroleum jelly or antibiotic cream on the tear. Do not allow a scab to form.  If the bandage sticks, moisten it with warm soapy water and remove it.  Protect the injured skin until it has healed.  Only take medicine as told by your doctor.  Take showers or baths using warm soapy water. Apply a new bandage after the shower or bath.  Keep all doctor visits as told. GET HELP RIGHT AWAY IF:   You have redness, puffiness (swelling), or more pain in the tear.  You have ayellowish-white fluid (pus) coming from the tear.  You have chills.  You have a red streak that goes away from the tear.  You have a bad smell coming from the tear or bandage.  You have a fever or lasting symptoms for more than 2-3 days.  You have a fever and your symptoms suddenly get worse. MAKE SURE YOU:   Understand these instructions.  Will watch this condition.  Will get help right away if you are not doing well or get worse. Document Released: 08/25/2008 Document Revised: 08/10/2012 Document Reviewed: 05/30/2012 [facility] Patient Information 2015 [place], [place]. This information is not intended to replace advice given to you by your health care provider. Make sure you discuss any questions you have with your health care provider. Hematoma A hematoma is a collection of blood. The collection of blood can turn into a hard, painful lump under the skin. Your skin may turn blue or yellow if the hematoma is close to the surface of the skin. Most hematomas get better in a few days to weeks. Some hematomas are serious and need medical care. Hematomas can  be very small or very big. HOME CARE  Apply ice to the injured area:  Put ice in a plastic bag.  Place a towel between your skin and the bag.  Leave the ice on for 20 minutes, 2-3 times a day for the first 1 to 2 days.  After the first 2 days, switch to using warm packs on the injured area.  Raise (elevate) the injured area to lessen pain and puffiness (swelling). You may also wrap the area with an elastic bandage. Make sure the bandage is not wrapped too tight.  If you have a painful hematoma on your leg or foot, you may use crutches for a couple days.  Only take medicines as told by your doctor. GET HELP RIGHT AWAY IF:   Your pain gets worse.  Your pain is not controlled with medicine.  You have a fever.  Your puffiness gets worse.  Your skin turns more blue or yellow.  Your skin over the hematoma breaks or starts bleeding.  Your hematoma is in your chest or belly (abdomen) and you are short of breath, feel weak, or have a change in consciousness.  Your hematoma is on your scalp and you have a headache that gets worse or a change in alertness or consciousness. MAKE SURE YOU:   Understand these instructions.  Will watch your condition.  Will get help right away if  you are not doing well or get worse. Document Released: 12/24/2004 Document Revised: 07/19/2013 Document Reviewed: 04/26/2013 [facility] Patient Information 2015 [place], [place]. This information is not intended to replace advice given to you by your health care provider. Make sure you discuss any questions you have with your health care provider.

## 2015-04-12 DIAGNOSIS — S61412A Laceration without foreign body of left hand, initial encounter: Secondary | ICD-10-CM | POA: Diagnosis not present

## 2015-04-15 ENCOUNTER — Ambulatory Visit: Payer: Medicare Other | Admitting: Physician Assistant

## 2015-04-19 DIAGNOSIS — S61412D Laceration without foreign body of left hand, subsequent encounter: Secondary | ICD-10-CM | POA: Diagnosis not present

## 2015-04-25 ENCOUNTER — Ambulatory Visit (INDEPENDENT_AMBULATORY_CARE_PROVIDER_SITE_OTHER): Payer: Medicare Other | Admitting: *Deleted

## 2015-04-25 ENCOUNTER — Encounter: Payer: Self-pay | Admitting: *Deleted

## 2015-04-25 VITALS — BP 148/71 | HR 77 | Ht 67.0 in | Wt 138.0 lb

## 2015-04-25 DIAGNOSIS — Z23 Encounter for immunization: Secondary | ICD-10-CM | POA: Diagnosis not present

## 2015-04-25 DIAGNOSIS — H9193 Unspecified hearing loss, bilateral: Secondary | ICD-10-CM

## 2015-04-25 DIAGNOSIS — Z0000 Encounter for general adult medical examination without abnormal findings: Secondary | ICD-10-CM | POA: Diagnosis not present

## 2015-04-25 NOTE — Progress Notes (Signed)
Patient ID: Albert Allen, male   DOB: September 26, 1923, 81 y.o.   MRN: [id number]   Subjective:   Albert Allen is a 81 y.o. male who presents for an Initial Medicare Annual Wellness Visit. Mr Manley is married and lives at home with his wife. He has 2 adult sons. 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 He does most of the housework and cares for his wife who has dementia. He is active in his church and he and his wife go to lunch through a program for senior citizens at the local recreation department 3-4 times per week.   Review of Systems   Cardiac Risk Factors include: advanced age (>52men, >86 women);dyslipidemia;hypertension;male gender   Psychological: He does not sleep well. States that it is difficult for him to fall asleep at night even though he is tired. He has a prescription for Xanax .25mg . He takes 1/2 to 1 tablet of this and it helps some but it wears off during the night. He has not tried any other sleeping medications. He does have to get up to urinate some as well.   Musculoskeletal: Back, neck, and leg pain due to multiple injuries over the years. He takes diclofenac and the pain is pretty well managed.   Other systems negative.     Objective:    Today's Vitals   04/25/15 0955  BP: 148/71  Pulse: 77  Height: 5\' 7"  (1.702 m)  Weight: 138 lb (62.596 kg)    Current Medications (verified) Outpatient Encounter Prescriptions as of 04/25/2015  Medication Sig  . albuterol (PROVENTIL HFA;VENTOLIN HFA) 108 (90 BASE) MCG/ACT inhaler Inhale 2 puffs into the lungs every 6 (six) hours as needed for wheezing or shortness of breath.  . ALPRAZolam (XANAX) 0.25 MG tablet Take 1 tablet (0.25 mg total) by mouth every 6 (six) hours as needed for sleep.  Marland Kitchen aspirin EC 81 MG tablet Take 81 mg by mouth daily.  Marland Kitchen escitalopram (LEXAPRO) 5 MG tablet Take 1 tablet (5 mg total) by mouth daily.  Marland Kitchen levothyroxine (SYNTHROID, LEVOTHROID) 75 MCG tablet Take 1 tablet (75 mcg total) by mouth  daily.  . mupirocin ointment (BACTROBAN) 2 % Place 1 application into the nose 2 (two) times daily.  . pantoprazole (PROTONIX) 40 MG tablet Take 2 tablets (80 mg total) by mouth daily. (Patient taking differently: Take 40 mg by mouth 2 (two) times daily. )  . pravastatin (PRAVACHOL) 40 MG tablet Take 1 tablet (40 mg total) by mouth daily.  . valsartan (DIOVAN) 160 MG tablet Take 1 tablet (160 mg total) by mouth daily.  . diclofenac (VOLTAREN) 75 MG EC tablet Take 75 mg by mouth every evening. Prn  . montelukast (SINGULAIR) 10 MG tablet Take 1 tablet (10 mg total) by mouth at bedtime. (Patient not taking: Reported on 04/25/2015)   No facility-administered encounter medications on file as of 04/25/2015.    Allergies (verified) Penicillins; Amoxicillin; Ceclor; Celebrex; and Levaquin   History: Past Medical History  Diagnosis Date  . HYPOTHYROIDISM 05/31/2007  . HYPERLIPIDEMIA 03/27/2009  . GOUT 05/31/2007  . ANEMIA, B12 DEFICIENCY 03/29/2008  . Anxiety state, unspecified 11/19/2008  . ABUSE, ALCOHOL, IN REMISSION 05/31/2007  . CARPAL TUNNEL SYNDROME, RIGHT 09/05/2007  . Alcoholic polyneuropathy 9/89/2119  . HYPERTENSION 05/31/2007  . CAD, NATIVE VESSEL 04/04/2009  . LEFT BUNDLE BRANCH BLOCK 02/11/2009  . SINUSITIS, CHRONIC NOS 05/31/2007  . ASTHMA 05/31/2007  . GERD 05/31/2007  . Chronic prostatitis 03/29/2008  . LOC OSTEOARTHROS NOT SPEC  PRIM/SEC LOWER LEG 03/20/2010  . OSTEOARTHRITIS 05/31/2007  . HIP PAIN 03/20/2010  . DEGENERATIVE DISC DISEASE, CERVICAL SPINE 06/22/2007  . Adhesive capsulitis of shoulder 12/29/2007  . SYNCOPE 11/19/2008  . WEIGHT LOSS, RECENT 11/19/2008  . DYSPHAGIA UNSPECIFIED 09/25/2008  . Hepatomegaly 09/25/2008  . PSA, INCREASED 03/20/2010  . ABNORMAL CV (STRESS) TEST 03/05/2009  . PERSONAL HX COLONIC POLYPS 03/05/1997    adenomatous   . Liver mass, left lobe   . Hiatal hernia   . Esophageal stricture    Past Surgical History  Procedure Laterality Date  . Pul. colapse w/chest  tube    . Tonsillectomy    . Carpel tunnel release    . Orif ulnar fracture  11/03/2012    Procedure: OPEN REDUCTION INTERNAL FIXATION (ORIF) ULNAR FRACTURE;  Surgeon: Hessie Dibble, MD;  Location: [facility];  Service: Orthopedics;  Laterality: Right;  ORIF radius and ulnar fractures   Family History  Problem Relation Age of Onset  . Cancer Mother     In back but unsure of what type of cancer  . Alcohol abuse Father   . Lung disease Father     abscess  . Colon cancer Neg Hx   . Diabetes Sister   . Cancer Brother     throat  . Diabetes Brother   . Diabetes Sister    Social History   Occupational History  . retired     Oncologist   Social History Main Topics  . Smoking status: Former Smoker    Types: Cigarettes    Quit date: 04/24/1998  . Smokeless tobacco: Never Used  . Alcohol Use: No  . Drug Use: No  . Sexual Activity: Not Currently   Activities of Daily Living In your present state of health, do you have any difficulty performing the following activities: 04/25/2015 04/25/2015  Hearing? - Y  Vision? - N  Difficulty concentrating or making decisions? - N  Walking or climbing stairs? - N  Dressing or bathing? - N  Doing errands, shopping? - N  Conservation officer, nature and eating ? - N  Using the Toilet? - N  In the past six months, have you accidently leaked urine? - N  Do you have problems with loss of bowel control? N -  Managing your Medications? N -  Managing your Finances? N -  Housekeeping or managing your Housekeeping? N -   Difficulty hearing. He has tinnitus in the right ear and bilateral hearing loss. Willing to see audiologist. Referral placed. May reschedule if the appointment time doesn't work for him.  Immunizations and Health Maintenance Immunization History  Administered Date(s) Administered  . Influenza Split 09/24/2011, 09/22/2012  . Influenza Whole 09/05/2007, 08/21/2008, 09/25/2009, 09/25/2010  . Influenza, High Dose Seasonal PF 10/02/2013  .  Influenza-Unspecified 08/22/2014  . Pneumococcal Conjugate-13 04/25/2015  . Pneumococcal Polysaccharide-23 02/11/2009  . Td 11/30/2005   Health Maintenance Due  Topic Date Due  . ZOSTAVAX  04/28/1985  Declined due to $91 cost. Will check cost at next visit.   Patient Care Team: [name], MD as PCP - General (Family Medicine) Jerene Bears, MD as Consulting Physician (Gastroenterology) Burnell Blanks, MD as Consulting Physician (Cardiology) Melissa Montane, MD as Consulting Physician (Otolaryngology) Myrlene Broker, MD as Attending Physician (Urology)  Indicate any recent Medical Services you may have received from other than [facility] providers in the past year (date may be approximate).    Assessment:   This is a routine wellness  examination for [facility].   Hearing/Vision screen No vision deficit noted but obvious hearing deficit noted during visit.  Dietary issues and exercise activities discussed:  Patient eats 3 balanced meals per day. He does most of the cooking and eats out on occasion. He does take his wife to eat at the [employer]creation [employer] that is catered by [employer] 3-4 times per week. He states that he doesn't usually eat this because his cooking tastes better.   He is active daily either doing housework, gardening, or yardwork. Up until recently he had hound dogs that he tended to as well and has thought of getting more. He isn't doing any formal exercise at present but is willing to start walking again on a routine basis. Suggested 30 minutes at least 3 times per week. In the [place] 1 mile in approximately 30 minutes.   Goals    . Exercise 3x per week (30 min per time)    . Increase water intake      Depression Screen PHQ 2/9 Scores 04/25/2015 02/05/2015 01/22/2015 01/01/2015  PHQ - 2 Score 0 0 0 0  Despite having a lot of stress he doesn't seem to be depressed. Screening was negative and during visit  his affect was appropriate and he didn't appear depressed.    Fall Risk Fall Risk  04/25/2015 02/05/2015 01/22/2015 06/11/2014 01/22/2014  Falls in the past year? Yes No No Yes Yes  Number falls in past yr: 1 - - 2 or more 1  Injury with Fall? No - - - No  Risk for fall due to : - - - Impaired balance/gait -  Risk for fall due to (comments): - - - - -  Patient tripped in his garden shed. There was no injury and he didn't fall to the ground. Asked him to move slowly to avoid falls and to be aware of his surroundings and tripping hazards.  Cognitive Function: MMSE - Mini Mental State Exam 04/25/2015  Orientation to time 5  Orientation to Place 3  Registration 3  Attention/ Calculation 4  Recall 3  Language- name 2 objects 2  Language- repeat 1  Language- follow 3 step command 3  Language- read & follow direction 1  Write a sentence 1  Copy design 1  Total score 27    Screening Tests Health Maintenance  Topic Date Due  . ZOSTAVAX  04/28/1985  . PNA vac Low Risk Adult (2 of 2 - PCV13) 05/08/2015 (Originally 02/11/2010)  . INFLUENZA VACCINE  07/01/2015  . TETANUS/TDAP  12/01/2015        Plan:  -Increase water intake. -Walk for at least 30 minutes 3 days per week. -Remain active.  -Keep scheduled appointment with Dr Livia Snellen in July 2016. Discuss insomnia at that visit.  -Schedule eye exam with [facility] when they notify you that it is time. If you decide to switch doctors we can arrange a new appointment for you.  During the course of the visit Ishmael was educated and counseled about the following appropriate screening and preventive services:   Vaccines to include Prevnar-Given today, Influenza-suggested yearly, Td-up to date, Zostavax-too expensive. Check cost at next visit.  Electrocardiogram-Done 01/22/15  Colorectal cancer screening-FOBT neg 05/2014. Colonoscopy done 2012.  Diabetes screening-Hgb A1C elevated at last visit. We will continue to monitor blood  sugar.   Glaucoma screening-Yearly eye exams suggested. Was seen last year and eye doctor sends  reminder letters when appointment is due. He will schedule this.  Nutrition counseling-Continue to eat a balanced diet with lean proteins, fruits, and vegetables. Watch ripe fruits because they are higher in sugar.   Prostate cancer screening-Sees urologist yearly. Last exam 07/2014.   Patient Instructions (the written plan) were given to the patient.   Chong Sicilian, RN   04/25/2015       I have reviewed and agree with the above AWV documentation.  [name], M.D.

## 2015-04-25 NOTE — Patient Instructions (Addendum)
Continue to stay active. Increase water intake. Walk for 30 minutes at least 3 times per week. Keep f/u appointment with Dr Livia Snellen in July 2016. Schedule eye exam. If you decide to change eye doctors call our office and we can arrange that appointment.    Health Maintenance A healthy lifestyle and preventative care can promote health and wellness.  Maintain regular health, dental, and eye exams.  Eat a healthy diet. Foods like vegetables, fruits, whole grains, low-fat dairy products, and lean protein foods contain the nutrients you need and are low in calories. Decrease your intake of foods high in solid fats, added sugars, and salt. Get information about a proper diet from your health care provider, if necessary.  Regular physical exercise is one of the most important things you can do for your health. Most adults should get at least 150 minutes of moderate-intensity exercise (any activity that increases your heart rate and causes you to sweat) each week. In addition, most adults need muscle-strengthening exercises on 2 or more days a week.   Maintain a healthy weight. The body mass index (BMI) is a screening tool to identify possible weight problems. It provides an estimate of body fat based on height and weight. Your health care provider can find your BMI and can help you achieve or maintain a healthy weight. For males 20 years and older:  A BMI below 18.5 is considered underweight.  A BMI of 18.5 to 24.9 is normal.  A BMI of 25 to 29.9 is considered overweight.  A BMI of 30 and above is considered obese.  Maintain normal blood lipids and cholesterol by exercising and minimizing your intake of saturated fat. Eat a balanced diet with plenty of fruits and vegetables. Blood tests for lipids and cholesterol should begin at age 18 and be repeated every 5 years. If your lipid or cholesterol levels are high, you are over age 78, or you are at high risk for heart disease, you may need your  cholesterol levels checked more frequently.Ongoing high lipid and cholesterol levels should be treated with medicines if diet and exercise are not working.  If you smoke, find out from your health care provider how to quit. If you do not use tobacco, do not start.  Lung cancer screening is recommended for adults aged 19-80 years who are at high risk for developing lung cancer because of a history of smoking. A yearly low-dose CT scan of the lungs is recommended for people who have at least a 30-pack-year history of smoking and are current smokers or have quit within the past 15 years. A pack year of smoking is smoking an average of 1 pack of cigarettes a day for 1 year (for example, a 30-pack-year history of smoking could mean smoking 1 pack a day for 30 years or 2 packs a day for 15 years). Yearly screening should continue until the smoker has stopped smoking for at least 15 years. Yearly screening should be stopped for people who develop a health problem that would prevent them from having lung cancer treatment.  If you choose to drink alcohol, do not have more than 2 drinks per day. One drink is considered to be 12 oz (360 mL) of beer, 5 oz (150 mL) of wine, or 1.5 oz (45 mL) of liquor.  Avoid the use of street drugs. Do not share needles with anyone. Ask for help if you need support or instructions about stopping the use of drugs.  High blood pressure causes  heart disease and increases the risk of stroke. Blood pressure should be checked at least every 1-2 years. Ongoing high blood pressure should be treated with medicines if weight loss and exercise are not effective.  If you are 67-30 years old, ask your health care provider if you should take aspirin to prevent heart disease.  Diabetes screening involves taking a blood sample to check your fasting blood sugar level. This should be done once every 3 years after age 63 if you are at a normal weight and without risk factors for diabetes. Testing  should be considered at a younger age or be carried out more frequently if you are overweight and have at least 1 risk factor for diabetes.  Colorectal cancer can be detected and often prevented. Most routine colorectal cancer screening begins at the age of 86 and continues through age 36. However, your health care provider may recommend screening at an earlier age if you have risk factors for colon cancer. On a yearly basis, your health care provider may provide home test kits to check for hidden blood in the stool. A small camera at the end of a tube may be used to directly examine the colon (sigmoidoscopy or colonoscopy) to detect the earliest forms of colorectal cancer. Talk to your health care provider about this at age 44 when routine screening begins. A direct exam of the colon should be repeated every 5-10 years through age 60, unless early forms of precancerous polyps or small growths are found.  People who are at an increased risk for hepatitis B should be screened for this virus. You are considered at high risk for hepatitis B if:  You were born in a country where hepatitis B occurs often. Talk with your health care provider about which countries are considered high risk.  Your parents were born in a high-risk country and you have not received a shot to protect against hepatitis B (hepatitis B vaccine).  You have HIV or AIDS.  You use needles to inject street drugs.  You live with, or have sex with, someone who has hepatitis B.  You are a man who has sex with other men (MSM).  You get hemodialysis treatment.  You take certain medicines for conditions like cancer, organ transplantation, and autoimmune conditions.  Hepatitis C blood testing is recommended for all people born from [birth year] through 1963 and any individual with known risk factors for hepatitis C.  Healthy men should no longer receive prostate-specific antigen (PSA) blood tests as part of routine cancer screening. Talk to  your health care provider about prostate cancer screening.  Testicular cancer screening is not recommended for adolescents or adult males who have no symptoms. Screening includes self-exam, a health care provider exam, and other screening tests. Consult with your health care provider about any symptoms you have or any concerns you have about testicular cancer.  Practice safe sex. Use condoms and avoid high-risk sexual practices to reduce the spread of sexually transmitted infections (STIs).  You should be screened for STIs, including gonorrhea and chlamydia if:  You are sexually active and are younger than 24 years.  You are older than 24 years, and your health care provider tells you that you are at risk for this type of infection.  Your sexual activity has changed since you were last screened, and you are at an increased risk for chlamydia or gonorrhea. Ask your health care provider if you are at risk.  If you are at risk of being  infected with HIV, it is recommended that you take a prescription medicine daily to prevent HIV infection. This is called pre-exposure prophylaxis (PrEP). You are considered at risk if:  You are a man who has sex with other men (MSM).  You are a heterosexual man who is sexually active with multiple partners.  You take drugs by injection.  You are sexually active with a partner who has HIV.  Talk with your health care provider about whether you are at high risk of being infected with HIV. If you choose to begin PrEP, you should first be tested for HIV. You should then be tested every 3 months for as long as you are taking PrEP.  Use sunscreen. Apply sunscreen liberally and repeatedly throughout the day. You should seek shade when your shadow is shorter than you. Protect yourself by wearing long sleeves, pants, a wide-brimmed hat, and sunglasses year round whenever you are outdoors.  Tell your health care provider of new moles or changes in moles, especially if  there is a change in shape or color. Also, tell your health care provider if a mole is larger than the size of a pencil eraser.  A one-time screening for abdominal aortic aneurysm (AAA) and surgical repair of large AAAs by ultrasound is recommended for men aged 72-75 years who are current or former smokers.  Stay current with your vaccines (immunizations). Document Released: 05/14/2008 Document Revised: 11/21/2013 Document Reviewed: 04/13/2011 [facility] Patient Information 2015 [place], [place]. This information is not intended to replace advice given to you by your health care provider. Make sure you discuss any questions you have with your health care provider.   Pneumococcal Vaccine, Polyvalent suspension for injection What is this medicine? PNEUMOCOCCAL VACCINE, POLYVALENT (NEU mo KOK al vak SEEN, pol ee VEY luhnt) is a vaccine to prevent pneumococcus bacteria infection. These bacteria are a major cause of ear infections, 'Strep throat' infections, and serious pneumonia, meningitis, or blood infections worldwide. These vaccines help the body to produce antibodies (protective substances) that help your body defend against these bacteria. This vaccine is recommended for infants and young children. This vaccine will not treat an infection. This medicine may be used for other purposes; ask your health care provider or pharmacist if you have questions. COMMON BRAND NAME(S): Prevnar 13 What should I tell my health care provider before I take this medicine? They need to know if you have any of these conditions: -bleeding problems -fever -immune system problems -low platelet count in the blood -seizures -an unusual or allergic reaction to pneumococcal vaccine, diphtheria toxoid, other vaccines, latex, other medicines, foods, dyes, or preservatives -pregnant or trying to get pregnant -breast-feeding How should I use this medicine? This vaccine is for injection into a muscle. It is given by a health  care professional. A copy of Vaccine Information Statements will be given before each vaccination. Read this sheet carefully each time. The sheet may change frequently. Talk to your pediatrician regarding the use of this medicine in children. While this drug may be prescribed for children as young as 34 weeks old for selected conditions, precautions do apply. Overdosage: If you think you have taken too much of this medicine contact a poison control center or emergency room at once. NOTE: This medicine is only for you. Do not share this medicine with others. What if I miss a dose? It is important not to miss your dose. Call your doctor or health care professional if you are unable to keep an appointment. What  may interact with this medicine? -medicines for cancer chemotherapy -medicines that suppress your immune function -medicines that treat or prevent blood clots like warfarin, enoxaparin, and dalteparin -steroid medicines like prednisone or cortisone This list may not describe all possible interactions. Give your health care provider a list of all the medicines, herbs, non-prescription drugs, or dietary supplements you use. Also tell them if you smoke, drink alcohol, or use illegal drugs. Some items may interact with your medicine. What should I watch for while using this medicine? Mild fever and pain should go away in 3 days or less. Report any unusual symptoms to your doctor or health care professional. What side effects may I notice from receiving this medicine? Side effects that you should report to your doctor or health care professional as soon as possible: -allergic reactions like skin rash, itching or hives, swelling of the face, lips, or tongue -breathing problems -confused -fever over 102 degrees F -pain, tingling, numbness in the hands or feet -seizures -unusual bleeding or bruising -unusual muscle weakness Side effects that usually do not require medical attention (report to  your doctor or health care professional if they continue or are bothersome): -aches and pains -diarrhea -fever of 102 degrees F or less -headache -irritable -loss of appetite -pain, tender at site where injected -trouble sleeping This list may not describe all possible side effects. Call your doctor for medical advice about side effects. You may report side effects to FDA at 1-800-FDA-1088. Where should I keep my medicine? This does not apply. This vaccine is given in a clinic, pharmacy, doctor's office, or other health care setting and will not be stored at home. NOTE: This sheet is a summary. It may not cover all possible information. If you have questions about this medicine, talk to your doctor, pharmacist, or health care provider.  2015, [employer]. (2009-01-29 10:17:22)

## 2015-06-12 DIAGNOSIS — J387 Other diseases of larynx: Secondary | ICD-10-CM | POA: Diagnosis not present

## 2015-06-12 DIAGNOSIS — K219 Gastro-esophageal reflux disease without esophagitis: Secondary | ICD-10-CM | POA: Diagnosis not present

## 2015-06-18 ENCOUNTER — Encounter: Payer: Self-pay | Admitting: Gastroenterology

## 2015-06-20 ENCOUNTER — Ambulatory Visit: Payer: Medicare Other | Admitting: Family Medicine

## 2015-07-11 ENCOUNTER — Telehealth: Payer: Self-pay | Admitting: Family Medicine

## 2015-07-11 ENCOUNTER — Other Ambulatory Visit: Payer: Self-pay

## 2015-07-11 ENCOUNTER — Other Ambulatory Visit: Payer: Self-pay | Admitting: *Deleted

## 2015-07-11 DIAGNOSIS — F329 Major depressive disorder, single episode, unspecified: Secondary | ICD-10-CM

## 2015-07-11 DIAGNOSIS — F32A Depression, unspecified: Secondary | ICD-10-CM

## 2015-07-11 MED ORDER — ALBUTEROL SULFATE HFA 108 (90 BASE) MCG/ACT IN AERS: 2.0000 | INHALATION_SPRAY | Freq: Four times a day (QID) | RESPIRATORY_TRACT | Status: DC | PRN

## 2015-07-11 MED ORDER — ALPRAZOLAM 0.25 MG PO TABS: 0.2500 mg | ORAL_TABLET | Freq: Four times a day (QID) | ORAL | Status: DC | PRN

## 2015-07-11 MED ORDER — ALPRAZOLAM 0.25 MG PO TABS: 0.2500 mg | ORAL_TABLET | Freq: Every day | ORAL | Status: DC

## 2015-07-11 NOTE — Telephone Encounter (Signed)
Last seen 04/11/15  Tiffany  If approved route to nurse to call into [facility]

## 2015-07-11 NOTE — Telephone Encounter (Signed)
If patient is using this for sleep, as on original prescription, he only needs 1 qhs. I will refill for 1 month with 1 refill . I am concerned about his age and possibility of falling with xanax.   Tiffany A. Benjamin Stain PA-C

## 2015-07-11 NOTE — Telephone Encounter (Signed)
Please review and advise.

## 2015-07-11 NOTE — Telephone Encounter (Signed)
done

## 2015-07-11 NOTE — Telephone Encounter (Signed)
Last filled 05/19/15, last seen 03/19/15. Call in at [facility]

## 2015-07-11 NOTE — Telephone Encounter (Signed)
RX called into [facility] per Dr Livia Snellen

## 2015-07-12 NOTE — Telephone Encounter (Signed)
Patient says he has already picked up script at [facility].  He will discuss quantity amount change at his next visit with Dr. Livia Snellen.

## 2015-07-29 ENCOUNTER — Encounter: Payer: Self-pay | Admitting: Family Medicine

## 2015-07-29 ENCOUNTER — Ambulatory Visit (INDEPENDENT_AMBULATORY_CARE_PROVIDER_SITE_OTHER): Payer: Medicare Other | Admitting: Family Medicine

## 2015-07-29 VITALS — BP 153/66 | HR 86 | Temp 96.9°F | Ht 67.0 in | Wt 141.2 lb

## 2015-07-29 DIAGNOSIS — E039 Hypothyroidism, unspecified: Secondary | ICD-10-CM | POA: Diagnosis not present

## 2015-07-29 DIAGNOSIS — K4090 Unilateral inguinal hernia, without obstruction or gangrene, not specified as recurrent: Secondary | ICD-10-CM

## 2015-07-29 DIAGNOSIS — F329 Major depressive disorder, single episode, unspecified: Secondary | ICD-10-CM

## 2015-07-29 DIAGNOSIS — F1010 Alcohol abuse, uncomplicated: Secondary | ICD-10-CM

## 2015-07-29 DIAGNOSIS — I10 Essential (primary) hypertension: Secondary | ICD-10-CM

## 2015-07-29 DIAGNOSIS — F32A Depression, unspecified: Secondary | ICD-10-CM

## 2015-07-29 DIAGNOSIS — F1011 Alcohol abuse, in remission: Secondary | ICD-10-CM

## 2015-07-29 MED ORDER — VALSARTAN 320 MG PO TABS: 320.0000 mg | ORAL_TABLET | Freq: Every day | ORAL | Status: DC

## 2015-07-29 MED ORDER — ALPRAZOLAM 0.25 MG PO TABS: 0.2500 mg | ORAL_TABLET | Freq: Every day | ORAL | Status: DC

## 2015-07-29 NOTE — Progress Notes (Signed)
Subjective:  Patient ID: Albert Allen, male    DOB: 1923/08/26  Age: 81 y.o. MRN: [id number]  CC: Hypertension; Hyperlipidemia; Hypothyroidism; Gastrophageal Reflux; and Insomnia   HPI Albert Allen presents for  follow-up of hypertension. Patient has no history of headache chest pain or shortness of breath or recent cough. Patient also denies symptoms of TIA such as numbness weakness lateralizing. Patient checks  blood pressure at home and has not had any elevated readings recently. Patient denies side effects from his medication. States taking it regularly. Patient presents for follow-up on  thyroid. She has a history of hypothyroidism for many years. It has been stable recently. Pt. denies any change in  voice, loss of hair, heat or cold intolerance. Energy level has been adequate to good. She denies constipation and diarrhea. No myxedema. Medication is as noted below. Verified that pt is taking it daily on an empty stomach. Well tolerated.  Patient in for follow-up of elevated cholesterol. Doing well without complaints on current medication. Denies side effects of statin including myalgia and arthralgia and nausea. Also in today for liver function testing. Currently no chest pain, shortness of breath or other cardiovascular related symptoms noted.  Patient in for follow-up of GERD. Currently asymptomatic taking  PPI daily. There is no chest pain or heartburn. No hematemesis and no melena. No dysphagia or choking. Onset is remote. Progression is stable. Complicating factors, none.   Patient states that his insomnia is doing well as long as takes the xanax at bedtime.  Bulging at right groin. Would like surgical repair.  History Albert has a past medical history of HYPOTHYROIDISM (05/31/2007); HYPERLIPIDEMIA (03/27/2009); GOUT (05/31/2007); ANEMIA, B12 DEFICIENCY (03/29/2008); Anxiety state, unspecified (11/19/2008); ABUSE, ALCOHOL, IN REMISSION (05/31/2007); CARPAL TUNNEL SYNDROME, RIGHT  (09/05/2007); Alcoholic polyneuropathy (03/29/2008); HYPERTENSION (05/31/2007); CAD, NATIVE VESSEL (04/04/2009); LEFT BUNDLE BRANCH BLOCK (02/11/2009); SINUSITIS, CHRONIC NOS (05/31/2007); ASTHMA (05/31/2007); GERD (05/31/2007); Chronic prostatitis (03/29/2008); LOC OSTEOARTHROS NOT SPEC PRIM/SEC LOWER LEG (03/20/2010); OSTEOARTHRITIS (05/31/2007); HIP PAIN (03/20/2010); DEGENERATIVE DISC DISEASE, CERVICAL SPINE (06/22/2007); Adhesive capsulitis of shoulder (12/29/2007); SYNCOPE (11/19/2008); WEIGHT LOSS, RECENT (11/19/2008); DYSPHAGIA UNSPECIFIED (09/25/2008); Hepatomegaly (09/25/2008); PSA, INCREASED (03/20/2010); ABNORMAL CV (STRESS) TEST (03/05/2009); PERSONAL HX COLONIC POLYPS (03/05/1997); Liver mass, left lobe; Hiatal hernia; and Esophageal stricture.   He has past surgical history that includes pul. colapse w/chest tube; Tonsillectomy; carpel tunnel release; and ORIF ulnar fracture (11/03/2012).   His family history includes Alcohol abuse in his father; Cancer in his brother and mother; Diabetes in his brother, sister, and sister; Lung disease in his father. There is no history of Colon cancer.He reports that he quit smoking about 17 years ago. His smoking use included Cigarettes. He has never used smokeless tobacco. He reports that he does not drink alcohol or use illicit drugs.  Outpatient Prescriptions Prior to Visit  Medication Sig Dispense Refill  . albuterol (PROVENTIL HFA;VENTOLIN HFA) 108 (90 BASE) MCG/ACT inhaler Inhale 2 puffs into the lungs every 6 (six) hours as needed for wheezing or shortness of breath. 3.7 g 2  . aspirin EC 81 MG tablet Take 81 mg by mouth daily.    Marland Kitchen levothyroxine (SYNTHROID, LEVOTHROID) 75 MCG tablet Take 1 tablet (75 mcg total) by mouth daily. 90 tablet 3  . pantoprazole (PROTONIX) 40 MG tablet Take 2 tablets (80 mg total) by mouth daily. (Patient taking differently: Take 40 mg by mouth 2 (two) times daily. ) 60 tablet 11  . pravastatin (PRAVACHOL) 40 MG tablet Take 1 tablet (40 mg  total) by mouth daily. 90 tablet 3  . ALPRAZolam (XANAX) 0.25 MG tablet Take 1 tablet (0.25 mg total) by mouth at bedtime. 30 tablet 1  . valsartan (DIOVAN) 160 MG tablet Take 1 tablet (160 mg total) by mouth daily. 90 tablet 2  . diclofenac (VOLTAREN) 75 MG EC tablet Take 75 mg by mouth every evening. Prn    . escitalopram (LEXAPRO) 5 MG tablet Take 1 tablet (5 mg total) by mouth daily. (Patient not taking: Reported on 07/29/2015) 30 tablet 2  . montelukast (SINGULAIR) 10 MG tablet Take 1 tablet (10 mg total) by mouth at bedtime. (Patient not taking: Reported on 07/29/2015) 30 tablet 5  . mupirocin ointment (BACTROBAN) 2 % Place 1 application into the nose 2 (two) times daily. (Patient not taking: Reported on 07/29/2015) 22 g 0   No facility-administered medications prior to visit.    ROS Review of Systems  Constitutional: Negative for fever, chills and diaphoresis.  HENT: Negative for congestion, rhinorrhea and sore throat.   Respiratory: Negative for cough, shortness of breath and wheezing.   Cardiovascular: Negative for chest pain.  Gastrointestinal: Negative for nausea, vomiting, abdominal pain, diarrhea, constipation and abdominal distention.  Genitourinary: Negative for dysuria and frequency.  Musculoskeletal: Negative for joint swelling and arthralgias.  Skin: Negative for rash.  Neurological: Negative for headaches.    Objective:  BP 153/66 mmHg  Pulse 86  Temp(Src) 96.9 F (36.1 C) (Oral)  Ht 5\' 7"  (1.702 m)  Wt 141 lb 3.2 oz (64.048 kg)  BMI 22.11 kg/m2  BP Readings from Last 3 Encounters:  07/29/15 153/66  04/25/15 148/71  04/11/15 149/79    Wt Readings from Last 3 Encounters:  07/29/15 141 lb 3.2 oz (64.048 kg)  04/25/15 138 lb (62.596 kg)  04/11/15 142 lb (64.411 kg)     Physical Exam  Constitutional: He is oriented to person, place, and time. He appears well-developed and well-nourished. No distress.  HENT:  Head: Normocephalic and atraumatic.  Right Ear:  External ear normal.  Left Ear: External ear normal.  Nose: Nose normal.  Mouth/Throat: Oropharynx is clear and moist.  Eyes: Conjunctivae and EOM are normal. Pupils are equal, round, and reactive to light.  Neck: Normal range of motion. Neck supple. No thyromegaly present.  Cardiovascular: Normal rate, regular rhythm and normal heart sounds.   No murmur heard. Pulmonary/Chest: Effort normal and breath sounds normal. No respiratory distress. He has no wheezes. He has no rales.  Abdominal: Soft. Bowel sounds are normal. He exhibits no distension. There is no tenderness. A hernia is present. Hernia confirmed positive in the right inguinal area.  Genitourinary:     Lymphadenopathy:    He has no cervical adenopathy.  Neurological: He is alert and oriented to person, place, and time. He has normal reflexes.  Skin: Skin is warm and dry.  Psychiatric: He has a normal mood and affect. His behavior is normal. Judgment and thought content normal.    Lab Results  Component Value Date   HGBA1C 6.1% 01/22/2015   HGBA1C 6.4* 11/19/2008    Lab Results  Component Value Date   WBC 3.8* 09/14/2014   HGB 13.6 09/14/2014   HCT 40.8 09/14/2014   PLT 231.0 09/14/2014   GLUCOSE 112* 01/29/2015   CHOL 180 01/22/2015   TRIG 194* 01/22/2015   HDL 54 01/22/2015   LDLDIRECT 67.7 01/12/2012   LDLCALC 87 01/22/2015   ALT 13 01/29/2015   AST 13 01/29/2015   NA 135 01/29/2015  K 4.6 01/29/2015   CL 96* 01/29/2015   CREATININE 0.83 01/29/2015   BUN 13 01/29/2015   CO2 22 01/29/2015   TSH 4.170 01/22/2015   PSA 1.42 01/12/2012   INR 0.97 11/03/2012   HGBA1C 6.1% 01/22/2015    Dg Chest 2 View  06/19/2014   CLINICAL DATA:  Weakness and abdominal pain and nausea  EXAM: CHEST  2 VIEW  COMPARISON:  PA and lateral chest of May 15, 2013  FINDINGS: The lungs remain hyperinflated. There are coarse right infrahilar lung markings not significantly changed from the previous study. There is no pleural  effusion. The heart and pulmonary vascularity are normal. There is no free subdiaphragmatic gas collection. There is stable mild anterior wedging of approximately T8.  IMPRESSION: COPD. There is no pneumonia nor CHF nor other acute cardiopulmonary abnormality.   Electronically Signed   By: David  Martinique   On: 06/19/2014 10:15   US Abdomen Complete  06/19/2014   CLINICAL DATA:  Right upper quadrant pain  EXAM: ULTRASOUND ABDOMEN COMPLETE  COMPARISON:  CT abdomen 04/21/2005 ; abdominal ultrasound 09/28/2008.  FINDINGS: Gallbladder:  No gallstones or wall thickening visualized. No sonographic Murphy sign noted.  Common bile duct:  Diameter: 6 mm  Liver:  There are 2 hyperechoic left hepatic masses measuring 2.1 x 2.4 x 2.3 cm and 2.1 x 2.3 x 2.1 cm respectively most consistent with a hemangioma are unchanged in size compared with prior CT of the abdomen dated 04/21/2005. Within normal limits in parenchymal echogenicity. There is no abdominal free fluid.  IVC:  No abnormality visualized.  Pancreas:  Visualized portion unremarkable.  Spleen:  Size and appearance within normal limits.  Right Kidney:  Length: 10 cm. Echogenicity within normal limits. No mass or hydronephrosis visualized.  Left Kidney:  Length: 10.2 cm. Echogenicity within normal limits. No mass or hydronephrosis visualized.  Abdominal aorta:  No aneurysm visualized.  Other findings:  None.  IMPRESSION: 1. No cholelithiasis or sonographic evidence of acute cholecystitis.  2. No obstructive uropathy.  3. No abdominal free fluid.   Electronically Signed   By: Kathreen Devoid   On: 06/19/2014 10:36   Ct Abdomen Pelvis W Contrast  06/19/2014   CLINICAL DATA:  Diffuse abdominal pain, weight loss  EXAM: CT ABDOMEN AND PELVIS WITH CONTRAST  TECHNIQUE: Multidetector CT imaging of the abdomen and pelvis was performed using the standard protocol following bolus administration of intravenous contrast.  CONTRAST:  162mL OMNIPAQUE IOHEXOL 300 MG/ML  SOLN  COMPARISON:   None.  FINDINGS: Sagittal images of the spine shows diffuse osteopenia. Disc space flattening with mild posterior spurring and vacuum disc phenomenon noted at L3-L4 level. Significant disc space flattening with endplate sclerotic changes and vacuum disc phenomenon at L5-S1 level. Mild compression deformity upper endplate of L1 vertebral body of indeterminate age. Emphysematous changes/bulla noted right base anteriorly.  There is moderate size hiatal hernia. A hemangioma in left hepatic dome measures 3.3 cm. Second hemangioma in lateral left hepatic lobe measures 3 cm. No calcified gallstones are noted within gallbladder.  Atherosclerotic calcifications and atherosclerotic plaques noted abdominal aorta and iliac arteries. A posterior wall somewhat exophytic atherosclerotic plaque noted in distal abdominal aorta axial image 34 and sagittal image 57. Measures about 1 cm. No evidence of aortic leak or obstruction.  There is a distal duodenal diverticulum just anterior to abdominal aorta measures about 2.4 cm.  The pancreas, spleen and adrenal glands are unremarkable. Kidneys are symmetrical in size and enhancement. No hydronephrosis  or hydroureter.  Delayed renal images shows bilateral renal symmetrical excretion.  No small bowel obstruction.  No ascites or free air.  No adenopathy.  There are diverticula in right colon. Minimal thickening of colonic wall in the region of colonic diverticula probable due to colonic diverticulosis. No definite evidence of acute diverticulitis. A normal appendix is clearly visualized in axial image 46. The transverse colon was is empty partially collapsed. No colonic obstruction. Scattered diverticula are noted descending colon. No evidence of acute diverticulitis. There is some gas noted in sigmoid colon. Moderate stool noted within rectum. The rectum measures 5.9 cm in diameter suspicious for mild fecal impaction. Moderate distended urinary bladder. No bladder filling defects. Prostate  gland measures 5.1 x 3.3 cm. Bilateral inguinal scrotal canal small hernia containing fat without evidence of acute complication. No destructive bony lesions are noted within pelvis.  IMPRESSION: 1. Moderate size hiatal hernia is noted. 2. Hemangiomas noted in left hepatic lobe the largest measures 3.3 cm. 3. No hydronephrosis or hydroureter. 4. Extensive atherosclerotic plaques and calcifications of abdominal aorta and iliac arteries. Somewhat exophytic posterior wall atherosclerotic plaque distal abdominal aorta measures about 1 cm. No evidence of leak or aortic obstruction. 5. Normal appendix. 6. Right colon diverticula are noted. Mild thickening of right colonic wall probable due to chronic diverticulosis. No definite evidence of acute diverticulitis. 7. Moderate stool noted within rectum. Mild fecal impaction cannot be excluded. 8. Mild enlarged prostate gland. 9. Moderate distended urinary bladder. 10. A distal duodenal diverticulum measures about 2.4 Cm without evidence of acute complication.   Electronically Signed   By: Lahoma Crocker M.D.   On: 06/19/2014 13:42    Assessment & Plan:   Linken was seen today for hypertension, hyperlipidemia, hypothyroidism, gastrophageal reflux and insomnia.  Diagnoses and all orders for this visit:  Unilateral inguinal hernia without obstruction or gangrene, recurrence not specified -     Ambulatory referral to General Surgery -     Cancel: CBC with Differential/Platelet -     CMP14+EGFR -     CBC with Differential/Platelet  Hypothyroidism, unspecified hypothyroidism type -     Cancel: CBC with Differential/Platelet -     CMP14+EGFR -     Lipid panel -     TSH -     T4, Free -     CBC with Differential/Platelet  Essential hypertension -     Cancel: CBC with Differential/Platelet -     CMP14+EGFR -     CBC with Differential/Platelet  ABUSE, ALCOHOL, IN REMISSION -     Cancel: CBC with Differential/Platelet -     CMP14+EGFR -     CBC with  Differential/Platelet  Depression -     ALPRAZolam (XANAX) 0.25 MG tablet; Take 1 tablet (0.25 mg total) by mouth at bedtime. -     Cancel: CBC with Differential/Platelet -     CMP14+EGFR -     CBC with Differential/Platelet  Other orders -     valsartan (DIOVAN) 320 MG tablet; Take 1 tablet (320 mg total) by mouth daily.   I have discontinued Mr. Allen escitalopram, montelukast, and mupirocin ointment. I have also changed his valsartan. Additionally, I am having him maintain his diclofenac, aspirin EC, pravastatin, levothyroxine, pantoprazole, albuterol, and ALPRAZolam.  Meds ordered this encounter  Medications  . valsartan (DIOVAN) 320 MG tablet    Sig: Take 1 tablet (320 mg total) by mouth daily.    Dispense:  30 tablet    Refill:  5  .  ALPRAZolam (XANAX) 0.25 MG tablet    Sig: Take 1 tablet (0.25 mg total) by mouth at bedtime.    Dispense:  30 tablet    Refill:  5     Follow-up: Return in about 6 months (around 01/28/2016).  [name], M.D.

## 2015-07-30 LAB — CMP14+EGFR
A/G RATIO: 2.1 (ref 1.1–2.5)
ALBUMIN: 4.5 g/dL (ref 3.2–4.6)
ALT: 9 IU/L (ref 0–44)
AST: 14 IU/L (ref 0–40)
Alkaline Phosphatase: 76 IU/L (ref 39–117)
BUN / CREAT RATIO: 19 (ref 10–22)
BUN: 14 mg/dL (ref 10–36)
Bilirubin Total: 0.3 mg/dL (ref 0.0–1.2)
CALCIUM: 9 mg/dL (ref 8.6–10.2)
CO2: 19 mmol/L (ref 18–29)
CREATININE: 0.75 mg/dL — AB (ref 0.76–1.27)
Chloride: 98 mmol/L (ref 97–108)
GFR, EST AFRICAN AMERICAN: 93 mL/min/{1.73_m2} (ref >59)
GFR, EST NON AFRICAN AMERICAN: 81 mL/min/{1.73_m2} (ref >59)
GLOBULIN, TOTAL: 2.1 g/dL (ref 1.5–4.5)
Glucose: 104 mg/dL — ABNORMAL HIGH (ref 65–99)
POTASSIUM: 4.5 mmol/L (ref 3.5–5.2)
SODIUM: 138 mmol/L (ref 134–144)
Total Protein: 6.6 g/dL (ref 6.0–8.5)

## 2015-07-30 LAB — CBC WITH DIFFERENTIAL/PLATELET
BASOS ABS: 0 10*3/uL (ref 0.0–0.2)
Basos: 0 %
EOS (ABSOLUTE): 0.1 10*3/uL (ref 0.0–0.4)
EOS: 3 %
HEMOGLOBIN: 12.7 g/dL (ref 12.6–17.7)
Hematocrit: 36.7 % — ABNORMAL LOW (ref 37.5–51.0)
Immature Grans (Abs): 0 10*3/uL (ref 0.0–0.1)
Immature Granulocytes: 1 %
LYMPHS ABS: 1.2 10*3/uL (ref 0.7–3.1)
Lymphs: 41 %
MCH: 34.3 pg — AB (ref 26.6–33.0)
MCHC: 34.6 g/dL (ref 31.5–35.7)
MCV: 99 fL — ABNORMAL HIGH (ref 79–97)
MONOS ABS: 0.7 10*3/uL (ref 0.1–0.9)
Monocytes: 24 %
NEUTROS PCT: 31 %
Neutrophils Absolute: 0.9 10*3/uL — ABNORMAL LOW (ref 1.4–7.0)
Platelets: 195 10*3/uL (ref 150–379)
RBC: 3.7 x10E6/uL — AB (ref 4.14–5.80)
RDW: 13.9 % (ref 12.3–15.4)
WBC: 3 10*3/uL — AB (ref 3.4–10.8)

## 2015-07-30 LAB — TSH: TSH: 1.25 u[IU]/mL (ref 0.450–4.500)

## 2015-07-30 LAB — LIPID PANEL
CHOL/HDL RATIO: 4 ratio (ref 0.0–5.0)
Cholesterol, Total: 164 mg/dL (ref 100–199)
HDL: 41 mg/dL (ref >39)
Triglycerides: 452 mg/dL — ABNORMAL HIGH (ref 0–149)

## 2015-07-30 LAB — T4, FREE: FREE T4: 1.65 ng/dL (ref 0.82–1.77)

## 2015-07-31 ENCOUNTER — Telehealth: Payer: Self-pay | Admitting: Family Medicine

## 2015-07-31 NOTE — Telephone Encounter (Signed)
Spoke with pt regarding Diovan 320mg  He is worried about doubling the medication He states BP readings at home are normal Please advise

## 2015-08-01 NOTE — Telephone Encounter (Signed)
Go back to 160 for now. Have him bring all of his readings to his next visit. (Please send in scrip)

## 2015-08-16 DIAGNOSIS — K4090 Unilateral inguinal hernia, without obstruction or gangrene, not specified as recurrent: Secondary | ICD-10-CM | POA: Diagnosis not present

## 2015-08-22 ENCOUNTER — Encounter: Payer: Self-pay | Admitting: *Deleted

## 2015-08-23 ENCOUNTER — Ambulatory Visit (INDEPENDENT_AMBULATORY_CARE_PROVIDER_SITE_OTHER): Payer: Medicare Other | Admitting: Cardiovascular Disease

## 2015-08-23 ENCOUNTER — Encounter: Payer: Self-pay | Admitting: Cardiovascular Disease

## 2015-08-23 VITALS — BP 140/60 | HR 82 | Ht 67.0 in | Wt 137.2 lb

## 2015-08-23 DIAGNOSIS — I10 Essential (primary) hypertension: Secondary | ICD-10-CM | POA: Diagnosis not present

## 2015-08-23 DIAGNOSIS — Z01810 Encounter for preprocedural cardiovascular examination: Secondary | ICD-10-CM | POA: Diagnosis not present

## 2015-08-23 DIAGNOSIS — I2510 Atherosclerotic heart disease of native coronary artery without angina pectoris: Secondary | ICD-10-CM

## 2015-08-23 NOTE — Patient Instructions (Signed)

## 2015-08-23 NOTE — Progress Notes (Signed)
Chief Complaint  Patient presents with  . Dizziness    History of Present Illness: 81 yo WM with past medical history significant for CAD, HTN, anemia, asthma/COPD, GERD, OA, gout hypothyroidism, hiatal hernia and alcohol abuse seen as a new patient 03/05/09 for evaluation of atypical chest pain, new LBBB and abnormal Myoview. Cardiac cath was performed on 03/13/09 and we found a 70-80% heavily calcified mid LAD that was severely diseased throughout the mid section with heavy calcification. The LAD was overall just moderate in size and not a good vessel for PCI. There was diffuse moderately obstructive disease in the other vessels. I discussed his disease with him in the hospital and we elected for medical management given that he was asymptomatic. No stents were placed. Vascular screening at [facility] October 2015 with mild bilateral carotid artery disease, normal ABI.  Echo 2014 showed normal LV size and function with mild AI and mild MR. Head CT without acute changes. Carotid artery dopplers June 2014 with mild bilateral disease. He has refused stress testing.   He is here today for follow up. He has had some bad days lately. His head feel like it is "full of sawdust". His eyes are puffy. He has no LE edema. He has no exertional chest pains. His breathing has been ok. He rarely notices his hernia. No change in bowel movements. No fever, chills. No abdominal pain.   Primary Care Physician: Stacks  Last Lipid Profile:Lipid Panel     Component Value Date/Time   CHOL 180 01/22/2015 0900             TRIG 194* 01/22/2015 0900        HDL 54 01/22/2015 0900                            LDLCALC 87 01/22/2015 0900               Past Medical History  Diagnosis Date  . HYPOTHYROIDISM 05/31/2007  . HYPERLIPIDEMIA 03/27/2009  . GOUT 05/31/2007  . ANEMIA, B12 DEFICIENCY 03/29/2008  . Anxiety state, unspecified 11/19/2008  . ABUSE, ALCOHOL, IN REMISSION 05/31/2007  . CARPAL TUNNEL SYNDROME,  RIGHT 09/05/2007  . Alcoholic polyneuropathy 6/31/4970  . HYPERTENSION 05/31/2007  . CAD, NATIVE VESSEL 04/04/2009  . LEFT BUNDLE BRANCH BLOCK 02/11/2009  . SINUSITIS, CHRONIC NOS 05/31/2007  . ASTHMA 05/31/2007  . GERD 05/31/2007  . Chronic prostatitis 03/29/2008  . LOC OSTEOARTHROS NOT SPEC PRIM/SEC LOWER LEG 03/20/2010  . OSTEOARTHRITIS 05/31/2007  . HIP PAIN 03/20/2010  . DEGENERATIVE DISC DISEASE, CERVICAL SPINE 06/22/2007  . Adhesive capsulitis of shoulder 12/29/2007  . SYNCOPE 11/19/2008  . WEIGHT LOSS, RECENT 11/19/2008  . DYSPHAGIA UNSPECIFIED 09/25/2008  . Hepatomegaly 09/25/2008  . PSA, INCREASED 03/20/2010  . ABNORMAL CV (STRESS) TEST 03/05/2009  . PERSONAL HX COLONIC POLYPS 03/05/1997    adenomatous   . Liver mass, left lobe   . Hiatal hernia   . Esophageal stricture     Past Surgical History  Procedure Laterality Date  . Pul. colapse w/chest tube    . Tonsillectomy    . Carpel tunnel release    . Orif ulnar fracture  11/03/2012    Procedure: OPEN REDUCTION INTERNAL FIXATION (ORIF) ULNAR FRACTURE;  Surgeon: Hessie Dibble, MD;  Location: [place];  Service: Orthopedics;  Laterality: Right;  ORIF radius and ulnar fractures    Current Outpatient Prescriptions  Medication Sig Dispense  Refill  . albuterol (PROVENTIL HFA;VENTOLIN HFA) 108 (90 BASE) MCG/ACT inhaler Inhale 2 puffs into the lungs every 6 (six) hours as needed for wheezing or shortness of breath. 3.7 g 2  . ALPRAZolam (XANAX) 0.25 MG tablet Take 0.25 mg by mouth every 6 (six) hours as needed for anxiety or sleep.    Marland Kitchen aspirin EC 81 MG tablet Take 81 mg by mouth daily.    . diclofenac (VOLTAREN) 75 MG EC tablet Take 75 mg by mouth daily.    Marland Kitchen levothyroxine (SYNTHROID, LEVOTHROID) 75 MCG tablet Take 1 tablet (75 mcg total) by mouth daily. 90 tablet 3  . pantoprazole (PROTONIX) 40 MG tablet Take 40 mg by mouth 2 (two) times daily.    . pravastatin (PRAVACHOL) 40 MG tablet Take 1 tablet (40 mg total) by mouth daily. 90 tablet 3  .  valsartan (DIOVAN) 320 MG tablet Take 1 tablet (320 mg total) by mouth daily. (Patient taking differently: Take 160 mg by mouth daily. ) 30 tablet 5   No current facility-administered medications for this visit.    Allergies  Allergen Reactions  . Penicillins Swelling    LIPS  . Amoxicillin Other (See Comments)    REACTION: unspecified  . Ceclor [Cefaclor] Other (See Comments)    Reaction: unspecified  . Celebrex [Celecoxib] Other (See Comments)    Reaction: Unspecified  . Levaquin [Levofloxacin In D5w] Other (See Comments)    Pt does not remember    Social History   Social History  . Marital Status: Married    Spouse Name: N/A  . Number of Children: N/A  . Years of Education: N/A   Occupational History  . retired     Oncologist   Social History Main Topics  . Smoking status: Former Smoker    Types: Cigarettes    Quit date: 04/24/1998  . Smokeless tobacco: Never Used  . Alcohol Use: No  . Drug Use: No  . Sexual Activity: Not Currently   Other Topics Concern  . Not on file   Social History Narrative    Family History  Problem Relation Age of Onset  . Cancer Mother     In back but unsure of what type of cancer  . Alcohol abuse Father   . Lung disease Father     abscess  . Colon cancer Neg Hx   . Diabetes Sister   . Cancer Brother     throat  . Diabetes Brother   . Diabetes Sister     Review of Systems:  As stated in the HPI and otherwise negative.   BP 140/60 mmHg  Pulse 82  Ht 5\' 7"  (1.702 m)  Wt 137 lb 3.2 oz (62.234 kg)  BMI 21.48 kg/m2  Physical Examination: General: Well developed, well nourished, NAD HEENT: OP clear, mucus membranes moist SKIN: warm, dry. No rashes. Neuro: No focal deficits Musculoskeletal: Muscle strength 5/5 all ext Psychiatric: Mood and affect normal Neck: No JVD, no carotid bruits, no thyromegaly, no lymphadenopathy. Lungs:Clear bilaterally, no wheezes, rhonci, crackles Cardiovascular: Regular rate and rhythm.  No murmurs, gallops or rubs. Abdomen:Soft. Bowel sounds present. Non-tender.  Extremities: No lower extremity edema. Pulses are 2 + in the bilateral DP/PT.  Echo 04/25/13: Left ventricle: The cavity size was normal. Wall thickness was increased in a pattern of mild LVH. Systolic function was normal. The estimated ejection fraction was in the range of 55% to 65%. Wall motion was normal; there were no regional wall motion abnormalities. Doppler  parameters are consistent with abnormal left ventricular relaxation (grade 1 diastolic dysfunction). - Aortic valve: Mild regurgitation. - Mitral valve: Calcified annulus. Mild regurgitation.  EKG:  EKG is ordered today. The ekg ordered today demonstrates Sinus, 1st degree AV block. LBBB  Recent Labs: 09/14/2014: Hemoglobin 13.6; Platelets 231.0 07/29/2015: ALT 9; BUN 14; Creatinine, Ser 0.75*; Potassium 4.5; Sodium 138; TSH 1.250   Lipid Panel    Component Value Date/Time   CHOL 164 07/29/2015 1357   CHOL 177 05/01/2013 1059   CHOL 164 09/22/2012 1051   TRIG 452* 07/29/2015 1357   TRIG 196* 05/01/2013 1059   HDL 41 07/29/2015 1357   HDL 45 05/01/2013 1059   HDL 41.90 09/22/2012 1051   CHOLHDL 4.0 07/29/2015 1357   CHOLHDL 4 09/22/2012 1051   VLDL 35.2 09/22/2012 1051   LDLCALC Comment 07/29/2015 1357   LDLCALC 93 05/01/2013 1059   LDLCALC 87 09/22/2012 1051   LDLDIRECT 67.7 01/12/2012 0823     Wt Readings from Last 3 Encounters:  08/23/15 137 lb 3.2 oz (62.234 kg)  07/29/15 141 lb 3.2 oz (64.048 kg)  04/25/15 138 lb (62.596 kg)     Other studies Reviewed: Additional studies/ records that were reviewed today include: . Review of the above records demonstrates:    Assessment and Plan:   1. CAD/pre-operative risk assessment: His CAD has been stable. He had a stress myoview in 2009 that showed anterior wall ischemic and was found by cath to have diffuse, calcific moderately severe stenosis in the moderate caliber left anterior  descending artery. The vessel was borderline in size for PCI and we elected at that time for medical management. He has done well with medical management of his CAD for the last 6 years. He has had no chest pain or dyspnea. He is now presenting for risk assessment before elective hernia repair. I am not sure there are any good options for PCI of his LAD as this vessel is only moderate in size, is diffusely diseased and heavily calcified. A repeat stress test would likely show ischemia in this anterior distribution. He would be at increased cardiac risk with any procedure requiring general anesthesia, although this risk is not prohibitive. I would favor conservative approach to his CAD given advanced age and knowing his coronary anatomy. I have reviewed Dr. Dois Davenport note and it seems that his hernia can also be followed for now. I would favor holding off on the hernia surgery for now. The patient is in agreement. Continue ASA, statin. No beta blocker due to bradycardia.   2. Dizziness: Improved off of metoprolol.   3. HTN: BP controlled. Continue Diovan.   4. LBBB: This has been noted on various EKGs over the last 6 years. Will follow. This is not a new problem  Current medicines are reviewed at length with the patient today.  The patient does not have concerns regarding medicines.  The following changes have been made:  no change  Labs/ tests ordered today include:  No orders of the defined types were placed in this encounter.    Disposition:   FU with me in 6 months  Signed, Lauree Chandler, MD 08/23/2015 3:47 PM    [facility] [place], [place], [place]  [address] Phone: [phone]; Fax: [phone]

## 2015-08-29 ENCOUNTER — Encounter: Payer: Self-pay | Admitting: Family Medicine

## 2015-08-29 ENCOUNTER — Ambulatory Visit (INDEPENDENT_AMBULATORY_CARE_PROVIDER_SITE_OTHER): Payer: Medicare Other | Admitting: Family Medicine

## 2015-08-29 VITALS — BP 157/81 | HR 79 | Temp 96.8°F | Ht 67.0 in | Wt 137.0 lb

## 2015-08-29 DIAGNOSIS — R3915 Urgency of urination: Secondary | ICD-10-CM

## 2015-08-29 LAB — POCT UA - MICROSCOPIC ONLY
CASTS, UR, LPF, POC: NEGATIVE
CRYSTALS, UR, HPF, POC: NEGATIVE
Mucus, UA: NEGATIVE
WBC, Ur, HPF, POC: NEGATIVE
Yeast, UA: NEGATIVE

## 2015-08-29 LAB — POCT URINALYSIS DIPSTICK
BILIRUBIN UA: NEGATIVE
GLUCOSE UA: NEGATIVE
KETONES UA: NEGATIVE
Leukocytes, UA: NEGATIVE
Nitrite, UA: NEGATIVE
Protein, UA: NEGATIVE
SPEC GRAV UA: 1.015
Urobilinogen, UA: NEGATIVE
pH, UA: 6.5

## 2015-08-29 MED ORDER — TAMSULOSIN HCL 0.4 MG PO CAPS: 0.4000 mg | ORAL_CAPSULE | Freq: Every day | ORAL | Status: DC

## 2015-08-29 MED ORDER — CIPROFLOXACIN HCL 250 MG PO TABS: 250.0000 mg | ORAL_TABLET | Freq: Two times a day (BID) | ORAL | Status: DC

## 2015-08-29 NOTE — Progress Notes (Signed)
Subjective:    Patient ID: Albert Allen, male    DOB: 01-14-1923, 81 y.o.   MRN: [id number]  HPI Patient here today for possible UTI. He states that he has the urge to urinate often, but only a small amount comes out. He states he woke up 6 times during the night with urgency. He also has nocturia usually, probably the result of BPH. He sees urologist he has had some nausea and headache but denies back pain.      Patient Active Problem List   Diagnosis Date Noted  . Inguinal hernia 07/29/2015  . Seasonal allergic rhinitis 04/17/2013  . BPH (benign prostatic hyperplasia) 04/17/2013  . Diverticulosis of colon (without mention of hemorrhage) 06/15/2011  . Hernia, hiatal 06/15/2011  . Diverticulum of duodenum 06/15/2011  . PERSONAL HX COLONIC POLYPS 03/25/2010  . PSA, INCREASED 03/20/2010  . CAD, NATIVE VESSEL 04/04/2009  . HYPERLIPIDEMIA 03/27/2009  . LEFT BUNDLE BRANCH BLOCK 02/11/2009  . HEPATOMEGALY 09/25/2008  . ANEMIA, B12 DEFICIENCY 03/29/2008  . ALCOHOLIC POLYNEUROPATHY 84/66/5993  . Hypothyroidism 05/31/2007  . ABUSE, ALCOHOL, IN REMISSION 05/31/2007  . Essential hypertension 05/31/2007  . ASTHMA 05/31/2007  . GERD 05/31/2007  . OSTEOARTHRITIS 05/31/2007   Outpatient Encounter Prescriptions as of 08/29/2015  Medication Sig  . albuterol (PROVENTIL HFA;VENTOLIN HFA) 108 (90 BASE) MCG/ACT inhaler Inhale 2 puffs into the lungs every 6 (six) hours as needed for wheezing or shortness of breath.  . ALPRAZolam (XANAX) 0.25 MG tablet Take 0.25 mg by mouth every 6 (six) hours as needed for anxiety or sleep.  Marland Kitchen aspirin EC 81 MG tablet Take 81 mg by mouth daily.  . diclofenac (VOLTAREN) 75 MG EC tablet Take 75 mg by mouth daily.  Marland Kitchen levothyroxine (SYNTHROID, LEVOTHROID) 75 MCG tablet Take 1 tablet (75 mcg total) by mouth daily.  . pantoprazole (PROTONIX) 40 MG tablet Take 40 mg by mouth 2 (two) times daily.  . pravastatin (PRAVACHOL) 40 MG tablet Take 1 tablet (40 mg total)  by mouth daily.  . valsartan (DIOVAN) 320 MG tablet Take 1 tablet (320 mg total) by mouth daily. (Patient taking differently: Take 160 mg by mouth daily. )   No facility-administered encounter medications on file as of 08/29/2015.       Review of Systems  Gastrointestinal: Positive for nausea and abdominal pain (lower pelvic pain at times).  Genitourinary: Positive for urgency.       Objective:   Physical Exam  Constitutional: He is oriented to person, place, and time. He appears well-developed and well-nourished.  Cardiovascular: Normal rate and regular rhythm.   Pulmonary/Chest: Effort normal and breath sounds normal.  Neurological: He is alert and oriented to person, place, and time.     BP 157/81 mmHg  Pulse 79  Temp(Src) 96.8 F (36 C) (Oral)  Ht 5\' 7"  (1.702 m)  Wt 137 lb (62.143 kg)  BMI 21.45 kg/m2      Assessment & Plan:  1. Urinary urgency Urinalysis blood and bacteria. I suspect he may have some prostatitis. I did not do a prostate exam today as he sees a urologist who does that. We'll begin Cipro 250 mg for 10 days with food. Also begin Flomax hoping this will help empty the bladder more completely and thus cut down on the frequency and urgency that he is having - POCT UA - Microscopic Only - POCT urinalysis dipstick - Urine culture - tamsulosin (FLOMAX) 0.4 MG CAPS capsule; Take 1 capsule (0.4 mg total) by mouth  daily after supper.  Dispense: 30 capsule; Refill: 1  Wardell Honour MD - ciprofloxacin (CIPRO) 250 MG tablet; Take 1 tablet (250 mg total) by mouth 2 (two) times daily.  Dispense: 20 tablet; Refill: 0

## 2015-08-31 LAB — URINE CULTURE

## 2015-09-10 ENCOUNTER — Ambulatory Visit: Payer: Medicare Other | Admitting: Cardiovascular Disease

## 2015-09-12 ENCOUNTER — Ambulatory Visit (INDEPENDENT_AMBULATORY_CARE_PROVIDER_SITE_OTHER): Payer: Medicare Other

## 2015-09-12 DIAGNOSIS — Z23 Encounter for immunization: Secondary | ICD-10-CM

## 2015-10-01 ENCOUNTER — Telehealth: Payer: Self-pay | Admitting: *Deleted

## 2015-10-01 ENCOUNTER — Telehealth: Payer: Self-pay | Admitting: Cardiovascular Disease

## 2015-10-01 NOTE — Telephone Encounter (Signed)
Walk in pt form-questions about surgery-gave to [facility]

## 2015-10-01 NOTE — Telephone Encounter (Signed)
Received walk in pt form from pt requesting a phone call regarding surgery.  I placed call to pt and left message to call back

## 2015-10-01 NOTE — Telephone Encounter (Signed)
Spoke with pt. He saw Dr. [name] recently and plans for hernia repair were placed on hold.  Pt now reports he is having a lot of pain, burning and stinging from hernia.  He would like to proceed with surgery.  Pt is scheduled to see Dr. Redmond Pulling again on 10/10/15.  Will forward to Dr. [name] for review

## 2015-10-02 NOTE — Telephone Encounter (Signed)
Follow up   Pt is calling to speak to rn

## 2015-10-02 NOTE — Telephone Encounter (Signed)
Left message to call back  

## 2015-10-02 NOTE — Telephone Encounter (Signed)
He has stenosis in a heavily calcified mid LAD, not a large vessel. He has done well with medical management over the years. He has done well with medical therapy. If he is having no chest pain, he can proceed with the planned elective procedure, however, I have advised him in the past that any surgery would put him at moderate risk given his known CAD. If his surgeon needs this in writing, they can contact us. Gerald Stabs

## 2015-10-02 NOTE — Telephone Encounter (Signed)
Spoke with pt and gave him information from Dr. [name].  Pt reports he is not having any chest pain.  He push mows his yard without any problems.  He is going to see surgeon next week and will have his office contact us regarding clearance.

## 2015-10-10 DIAGNOSIS — K4090 Unilateral inguinal hernia, without obstruction or gangrene, not specified as recurrent: Secondary | ICD-10-CM | POA: Diagnosis not present

## 2015-10-14 DIAGNOSIS — R3989 Other symptoms and signs involving the genitourinary system: Secondary | ICD-10-CM | POA: Diagnosis not present

## 2015-10-21 DIAGNOSIS — R8290 Unspecified abnormal findings in urine: Secondary | ICD-10-CM | POA: Diagnosis not present

## 2015-10-21 DIAGNOSIS — R339 Retention of urine, unspecified: Secondary | ICD-10-CM | POA: Diagnosis not present

## 2015-10-21 DIAGNOSIS — N401 Enlarged prostate with lower urinary tract symptoms: Secondary | ICD-10-CM | POA: Diagnosis not present

## 2015-10-28 ENCOUNTER — Other Ambulatory Visit: Payer: Self-pay | Admitting: Family Medicine

## 2015-10-30 ENCOUNTER — Ambulatory Visit: Payer: Self-pay | Admitting: General Surgery

## 2015-11-21 ENCOUNTER — Encounter: Payer: Self-pay | Admitting: Family Medicine

## 2015-11-21 ENCOUNTER — Ambulatory Visit (INDEPENDENT_AMBULATORY_CARE_PROVIDER_SITE_OTHER): Payer: Medicare Other | Admitting: Family Medicine

## 2015-11-21 VITALS — BP 169/74 | HR 72 | Temp 97.3°F | Ht 67.0 in | Wt 139.0 lb

## 2015-11-21 DIAGNOSIS — J0100 Acute maxillary sinusitis, unspecified: Secondary | ICD-10-CM

## 2015-11-21 NOTE — Progress Notes (Signed)
   Subjective:    Patient ID: Albert Allen, male    DOB: [birth year], 81 y.o.   MRN: [id number]  HPI   81 year old gentleman with some head congestion , minimal cough. There is been no fever. His head feels like it is enlarged. He complains of some postnasal drainage.    Review of Systems  Constitutional: Negative.   HENT: Positive for congestion, postnasal drip and rhinorrhea.   Respiratory: Negative.   Cardiovascular: Negative.   Neurological: Negative.   Psychiatric/Behavioral: Negative.       BP 169/74 mmHg  Pulse 72  Temp(Src) 97.3 F (36.3 C) (Oral)  Ht 5\' 7"  (1.702 m)  Wt 139 lb (63.05 kg)  BMI 21.77 kg/m2  Objective:   Physical Exam  Constitutional: He is oriented to person, place, and time. He appears well-developed and well-nourished.  HENT:  There is thick drainage in the back of his throat  Cardiovascular: Normal rate and regular rhythm.   Pulmonary/Chest: Effort normal and breath sounds normal.  Neurological: He is alert and oriented to person, place, and time.          Assessment & Plan:  1. Acute maxillary sinusitis, recurrence not specified With thick drainage will treat his sinus infection patient is convinced he needs a Z-Pak to get better. He does have impending surgery and couple weeks with preop next week. Rx Z-Pak. Take Mucinex along with this and drink plenty of fluids  Wardell Honour MD

## 2015-11-27 ENCOUNTER — Other Ambulatory Visit: Payer: Self-pay | Admitting: Family Medicine

## 2015-11-28 NOTE — Pre-Procedure Instructions (Signed)
    DUJUAN AGRELLA  11/28/2015      [id number] - [place], [place] - [address] [place] [address] Phone: [phone] Fax: [phone]  [facility] [address] [place] [address] Phone: [phone] Fax: [phone]    Your procedure is scheduled on 12/06/15.  Report to [facility] Admitting at 530 A.M.  Call this number if you have problems the morning of surgery:  [phone]   Remember:  Do not eat food or drink liquids after midnight.  Take these medicines the morning of surgery with A SIP OF WATER --all inhales,xanax,synthroid,protonix,flomax   Do not wear jewelry, make-up or nail polish.  Do not wear lotions, powders, or perfumes.  You may wear deodorant.  Do not shave 48 hours prior to surgery.  Men may shave face and neck.  Do not bring valuables to the hospital.  [facility] is not responsible for any belongings or valuables.  Contacts, dentures or bridgework may not be worn into surgery.  Leave your suitcase in the car.  After surgery it may be brought to your room.  For patients admitted to the hospital, discharge time will be determined by your treatment team.  Patients discharged the day of surgery will not be allowed to drive home.   Name and phone number of your driver:   Special instructions:   Please read over the following fact sheets that you were given. Pain Booklet, Coughing and Deep Breathing and Surgical Site Infection Prevention

## 2015-11-29 ENCOUNTER — Encounter (HOSPITAL_COMMUNITY)
Admission: RE | Admit: 2015-11-29 | Discharge: 2015-11-29 | Disposition: A | Discharge status: Home / Self Care | Payer: Medicare Other | Source: Ambulatory Visit | Attending: General Surgery | Admitting: General Surgery

## 2015-11-29 ENCOUNTER — Encounter (HOSPITAL_COMMUNITY): Payer: Self-pay

## 2015-11-29 ENCOUNTER — Ambulatory Visit (INDEPENDENT_AMBULATORY_CARE_PROVIDER_SITE_OTHER): Payer: Medicare Other | Admitting: Pediatrics

## 2015-11-29 VITALS — BP 162/76 | HR 88 | Temp 97.3°F | Ht 67.0 in | Wt 136.2 lb

## 2015-11-29 DIAGNOSIS — I10 Essential (primary) hypertension: Secondary | ICD-10-CM | POA: Diagnosis not present

## 2015-11-29 DIAGNOSIS — J019 Acute sinusitis, unspecified: Secondary | ICD-10-CM

## 2015-11-29 DIAGNOSIS — J45909 Unspecified asthma, uncomplicated: Secondary | ICD-10-CM | POA: Insufficient documentation

## 2015-11-29 DIAGNOSIS — Z01812 Encounter for preprocedural laboratory examination: Secondary | ICD-10-CM | POA: Diagnosis not present

## 2015-11-29 DIAGNOSIS — Z01818 Encounter for other preprocedural examination: Secondary | ICD-10-CM | POA: Insufficient documentation

## 2015-11-29 DIAGNOSIS — Z87891 Personal history of nicotine dependence: Secondary | ICD-10-CM | POA: Insufficient documentation

## 2015-11-29 DIAGNOSIS — E538 Deficiency of other specified B group vitamins: Secondary | ICD-10-CM | POA: Insufficient documentation

## 2015-11-29 DIAGNOSIS — I2510 Atherosclerotic heart disease of native coronary artery without angina pectoris: Secondary | ICD-10-CM | POA: Insufficient documentation

## 2015-11-29 DIAGNOSIS — E039 Hypothyroidism, unspecified: Secondary | ICD-10-CM | POA: Diagnosis not present

## 2015-11-29 DIAGNOSIS — K4090 Unilateral inguinal hernia, without obstruction or gangrene, not specified as recurrent: Secondary | ICD-10-CM | POA: Diagnosis not present

## 2015-11-29 DIAGNOSIS — E785 Hyperlipidemia, unspecified: Secondary | ICD-10-CM | POA: Insufficient documentation

## 2015-11-29 HISTORY — DX: Acute pharyngitis, unspecified: J02.9

## 2015-11-29 LAB — BASIC METABOLIC PANEL
ANION GAP: 9 (ref 5–15)
BUN: 10 mg/dL (ref 6–20)
CHLORIDE: 103 mmol/L (ref 101–111)
CO2: 23 mmol/L (ref 22–32)
Calcium: 9.2 mg/dL (ref 8.9–10.3)
Creatinine, Ser: 1.04 mg/dL (ref 0.61–1.24)
GFR calc non Af Amer: >60 mL/min (ref >60)
Glucose, Bld: 135 mg/dL — ABNORMAL HIGH (ref 65–99)
Potassium: 4.1 mmol/L (ref 3.5–5.1)
Sodium: 135 mmol/L (ref 135–145)

## 2015-11-29 LAB — CBC
HEMATOCRIT: 37.2 % — AB (ref 39.0–52.0)
HEMOGLOBIN: 12.6 g/dL — AB (ref 13.0–17.0)
MCH: 34.1 pg — ABNORMAL HIGH (ref 26.0–34.0)
MCHC: 33.9 g/dL (ref 30.0–36.0)
MCV: 100.5 fL — ABNORMAL HIGH (ref 78.0–100.0)
Platelets: 154 10*3/uL (ref 150–400)
RBC: 3.7 MIL/uL — ABNORMAL LOW (ref 4.22–5.81)
RDW: 12.8 % (ref 11.5–15.5)
WBC: 3.8 10*3/uL — AB (ref 4.0–10.5)

## 2015-11-29 MED ORDER — AZITHROMYCIN 250 MG PO TABS: ORAL_TABLET | ORAL | Status: DC

## 2015-11-29 NOTE — Progress Notes (Signed)
Will f/u with Albert Allen to review ekg.Also pt is currently c/o of sore throat.

## 2015-11-29 NOTE — Progress Notes (Signed)
Pt stated he did not have a Z pk

## 2015-11-29 NOTE — Progress Notes (Addendum)
Subjective:    Patient ID: Albert Allen, male    DOB: 03/13/1923, 81 y.o.   MRN: [id number]  CC: Cough; Sore Throat; and Nasal Congestion   HPI: Albert Allen is a 81 y.o. male presenting for Cough; Sore Throat; and Nasal Congestion  Sick for past 1.5 wks Trying mucinex, helped some Still with congestion, cough, feeling more tired than usual No fevers at home   Depression screen [facility] 2/9 11/29/2015 08/29/2015 07/29/2015 04/25/2015 02/05/2015  Decreased Interest 0 0 0 0 0  Down, Depressed, Hopeless 0 0 0 0 0  PHQ - 2 Score 0 0 0 0 0     Relevant past medical, surgical, family and social history reviewed and updated as indicated. Interim medical history since our last visit reviewed. Allergies and medications reviewed and updated.    ROS: Per HPI unless specifically indicated above  History  Smoking status  . Former Smoker  . Types: Cigarettes  . Quit date: 04/24/1998  Smokeless tobacco  . Never Used    Past Medical History Patient Active Problem List   Diagnosis Date Noted  . Inguinal hernia 07/29/2015  . Seasonal allergic rhinitis 04/17/2013  . BPH (benign prostatic hyperplasia) 04/17/2013  . Diverticulosis of colon (without mention of hemorrhage) 06/15/2011  . Hernia, hiatal 06/15/2011  . Diverticulum of duodenum 06/15/2011  . PERSONAL HX COLONIC POLYPS 03/25/2010  . PSA, INCREASED 03/20/2010  . CAD, NATIVE VESSEL 04/04/2009  . HYPERLIPIDEMIA 03/27/2009  . LEFT BUNDLE BRANCH BLOCK 02/11/2009  . HEPATOMEGALY 09/25/2008  . ANEMIA, B12 DEFICIENCY 03/29/2008  . ALCOHOLIC POLYNEUROPATHY [id number]  . Hypothyroidism 05/31/2007  . ABUSE, ALCOHOL, IN REMISSION 05/31/2007  . Essential hypertension 05/31/2007  . ASTHMA 05/31/2007  . GERD 05/31/2007  . OSTEOARTHRITIS 05/31/2007       Objective:    BP 162/76 mmHg  Pulse 88  Temp(Src) 97.3 F (36.3 C) (Oral)  Ht 5\' 7"  (1.702 m)  Wt 136 lb 3.2 oz (61.78 kg)  BMI 21.33 kg/m2  Wt Readings from Last 3  Encounters:  11/29/15 136 lb 3.2 oz (61.78 kg)  11/29/15 136 lb 2 oz (61.746 kg)  11/21/15 139 lb (63.05 kg)   Gen: NAD, alert, cooperative with exam, NCAT, congested EYES: EOMI, no scleral injection or icterus ENT:  TMs dull gray b/l, OP without erythema LYMPH: no cervical LAD CV: NRRR, normal S1/S2, no murmur, distal pulses 2+ b/l Resp: CTABL, no wheezes, normal WOB Abd: +BS, soft, NTND. no guarding or organomegaly Ext: No edema, warm Neuro: Alert and oriented, strength equal b/l UE and LE, coordination grossly normal MSK: normal muscle bulk     Assessment & Plan:    Ta was seen today for cough, sore throat and nasal congestion, ongoing for past 1.5 weeks, will treat with azithromycin as below. Pt allergic to PCN.  Diagnoses and all orders for this visit:  Acute sinusitis, recurrence not specified, unspecified location -     azithromycin (ZITHROMAX) 250 MG tablet; Take 2 the first day and then one each day after.  Essential HTN Slightly elevated today, per pt is not usually this high at home, taking 160mg  valsartan daily. He has been taking some OTC cough and cold medicines. No headaches, vision changes, no lightheadedness. Will get him to continue to check at home, let me or PCP know if continues to be elevated at home.  Follow up plan: Return if symptoms worsen or fail to improve.  [name], MD [facility] 11/29/2015, 5:31  PM   

## 2015-12-02 NOTE — Addendum Note (Signed)
Addended by: Eustaquio Maize on: 12/02/2015 11:11 AM   Modules accepted: SmartSet

## 2015-12-03 ENCOUNTER — Other Ambulatory Visit: Payer: Self-pay | Admitting: General Surgery

## 2015-12-03 ENCOUNTER — Telehealth: Payer: Self-pay | Admitting: General Surgery

## 2015-12-03 ENCOUNTER — Other Ambulatory Visit: Payer: Self-pay | Admitting: Family Medicine

## 2015-12-03 ENCOUNTER — Encounter (HOSPITAL_COMMUNITY): Payer: Self-pay | Admitting: Emergency Medicine

## 2015-12-03 NOTE — Telephone Encounter (Signed)
Called pt to check on. Feeling a little better. But has been in bed all weekend. Just now has less sore throat and now drinking more fluids. Advised him i don't think it is safe to proceed with surgery this Friday and that we will cancel his surgery for this Friday and reschedule him for about 2 weeks from now. He is in agreement and requests an early AM or time. Told him our office would contact him to reschedule surgery.

## 2015-12-03 NOTE — Progress Notes (Signed)
Anesthesia Chart Review:  Pt is 82 year old male scheduled for open repair R inguinal hernia, insertion of mesh on 12/06/15 with Dr. Redmond Pulling.   Cardiologist is [name], last office visit 08/23/15. PCP is Dr. [name].   PMH includes:  LBBB, CAD, HTN, hyperlipidemia, hx alcohol abuse, hypothyroidism, syncope, liver mass, B12 deficiency, asthma. Former smoker. BMI 21. S/p ORIF R ulnar fracture 11/03/12.   Pt complained of sore throat at PAT. Was seen at PCP's office 12/02/14, rx zithromax for a sinus infection.  Notified Tandie in Dr. Dois Davenport office.   Medications includes: albuterol, ASA, azithromycin, levothyroxine, prontonix, pravastatin, valsartan.   Preoperative labs reviewed.    EKG 08/23/15: sinus rhythm with sinus arrhythmia with 1st degree AV block. LBBB.   Carotid duplex US 05/19/13: 1-39% B ICA stenoses  Echo 04/25/13:  - Left ventricle: The cavity size was normal. Wall thickness was increased in a pattern of mild LVH. Systolic function was normal. The estimated ejection fraction was in the range of 55% to 65%. Wall motion was normal; there were no regional wall motion abnormalities. Doppler parameters are consistent with abnormal left ventricular relaxation (grade 1 diastolic dysfunction). - Aortic valve: Mild regurgitation. - Mitral valve: Calcified annulus. Mild regurgitation.  Cardiac cath 03/13/09:  1. Single-vessel coronary artery disease with a heavily calcified proximal and mid LAD and an area of stenosis that is just prior to the take off of a large diagonal branch. 2. Normal left ventricular systolic function. Recommendations: The LAD was overall just moderate in size and not a good vessel for PCI. There was diffuse moderately obstructive disease in the other vessels. I discussed his disease with him in the hospital and we elected for medical management given that he was asymptomatic. No stents were placed.   Pt has cardiac clearance at moderate risk from  Dr. [name] (see Epic telephone encounter dated 10/02/15).   If pt has sufficiently recovered from sinus infection by DOS, I anticipate he can proceed as scheduled.   Willeen Cass, FNP-BC [facility]iology Phone: [phone] 12/03/2015 1:26 PM

## 2015-12-03 NOTE — Telephone Encounter (Signed)
-----   Message from Racheal Patches sent at 12/03/2015  1:40 PM EST ----- Regarding: [place] [facility] scheduled on 12/09/15 for right inguinal hernia repair with mesh   Pre-op called to notify office that patient  went to PCP office today and has been diagnosed with sinus infection.    Thanks   [employer]

## 2015-12-06 ENCOUNTER — Encounter (HOSPITAL_COMMUNITY): Admission: RE | Payer: Self-pay | Source: Ambulatory Visit

## 2015-12-06 ENCOUNTER — Ambulatory Visit (HOSPITAL_COMMUNITY): Admission: RE | Admit: 2015-12-06 | Payer: Medicare Other | Source: Ambulatory Visit | Admitting: General Surgery

## 2015-12-06 SURGERY — REPAIR, HERNIA, INGUINAL, ADULT
Anesthesia: Choice | Laterality: Right

## 2015-12-16 ENCOUNTER — Ambulatory Visit: Payer: Self-pay | Admitting: General Surgery

## 2015-12-17 NOTE — Anesthesia Preprocedure Evaluation (Addendum)
Anesthesia Evaluation  Patient identified by MRN, date of birth, ID band Patient awake    Reviewed: Allergy & Precautions, H&P , NPO status , Patient's Chart, lab work & pertinent test results  Airway Mallampati: II  TM Distance: >3 FB Neck ROM: Full    Dental no notable dental hx. (+) Teeth Intact, Dental Advisory Given   Pulmonary asthma , former smoker,    Pulmonary exam normal breath sounds clear to auscultation       Cardiovascular hypertension, Pt. on medications + CAD  + dysrhythmias  Rhythm:Regular Rate:Normal     Neuro/Psych Anxiety negative neurological ROS  negative psych ROS   GI/Hepatic Neg liver ROS, GERD  Medicated and Controlled,  Endo/Other  Hypothyroidism   Renal/GU negative Renal ROS  negative genitourinary   Musculoskeletal  (+) Arthritis , Osteoarthritis,    Abdominal   Peds  Hematology negative hematology ROS (+)   Anesthesia Other Findings   Reproductive/Obstetrics negative OB ROS                           Anesthesia Physical Anesthesia Plan  ASA: III  Anesthesia Plan: General and Regional   Post-op Pain Management: GA combined w/ Regional for post-op pain   Induction: Intravenous  Airway Management Planned: Oral ETT and Video Laryngoscope Planned  Additional Equipment:   Intra-op Plan:   Post-operative Plan: Extubation in OR  Informed Consent: I have reviewed the patients History and Physical, chart, labs and discussed the procedure including the risks, benefits and alternatives for the proposed anesthesia with the patient or authorized representative who has indicated his/her understanding and acceptance.   Dental advisory given  Plan Discussed with: CRNA  Anesthesia Plan Comments:        Anesthesia Quick Evaluation

## 2015-12-18 ENCOUNTER — Encounter (HOSPITAL_COMMUNITY)
Admission: RE | Disposition: A | Discharge status: Home / Self Care | Payer: Self-pay | Source: Ambulatory Visit | Attending: General Surgery

## 2015-12-18 ENCOUNTER — Encounter (HOSPITAL_COMMUNITY): Payer: Self-pay | Admitting: Certified Registered Nurse Anesthetist

## 2015-12-18 ENCOUNTER — Ambulatory Visit (HOSPITAL_COMMUNITY): Payer: Medicare Other | Admitting: Anesthesiology

## 2015-12-18 ENCOUNTER — Observation Stay (HOSPITAL_COMMUNITY)
Admission: RE | Admit: 2015-12-18 | Discharge: 2015-12-19 | Disposition: A | Discharge status: Home / Self Care | Payer: Medicare Other | Source: Ambulatory Visit | Attending: General Surgery | Admitting: General Surgery

## 2015-12-18 DIAGNOSIS — Z880 Allergy status to penicillin: Secondary | ICD-10-CM | POA: Diagnosis not present

## 2015-12-18 DIAGNOSIS — I10 Essential (primary) hypertension: Secondary | ICD-10-CM | POA: Diagnosis not present

## 2015-12-18 DIAGNOSIS — E785 Hyperlipidemia, unspecified: Secondary | ICD-10-CM | POA: Insufficient documentation

## 2015-12-18 DIAGNOSIS — I2510 Atherosclerotic heart disease of native coronary artery without angina pectoris: Secondary | ICD-10-CM | POA: Diagnosis present

## 2015-12-18 DIAGNOSIS — Z87891 Personal history of nicotine dependence: Secondary | ICD-10-CM | POA: Insufficient documentation

## 2015-12-18 DIAGNOSIS — E039 Hypothyroidism, unspecified: Secondary | ICD-10-CM | POA: Diagnosis not present

## 2015-12-18 DIAGNOSIS — Z7982 Long term (current) use of aspirin: Secondary | ICD-10-CM | POA: Diagnosis not present

## 2015-12-18 DIAGNOSIS — J45909 Unspecified asthma, uncomplicated: Secondary | ICD-10-CM | POA: Diagnosis not present

## 2015-12-18 DIAGNOSIS — Z79899 Other long term (current) drug therapy: Secondary | ICD-10-CM | POA: Diagnosis not present

## 2015-12-18 DIAGNOSIS — N400 Enlarged prostate without lower urinary tract symptoms: Secondary | ICD-10-CM | POA: Insufficient documentation

## 2015-12-18 DIAGNOSIS — K4090 Unilateral inguinal hernia, without obstruction or gangrene, not specified as recurrent: Principal | ICD-10-CM

## 2015-12-18 DIAGNOSIS — G8918 Other acute postprocedural pain: Secondary | ICD-10-CM | POA: Diagnosis not present

## 2015-12-18 DIAGNOSIS — K219 Gastro-esophageal reflux disease without esophagitis: Secondary | ICD-10-CM | POA: Diagnosis not present

## 2015-12-18 HISTORY — PX: INGUINAL HERNIA REPAIR: SHX194

## 2015-12-18 HISTORY — PX: INGUINAL HERNIA REPAIR: SUR1180

## 2015-12-18 HISTORY — PX: INSERTION OF MESH: SHX5868

## 2015-12-18 LAB — CBC
HEMATOCRIT: 36.5 % — AB (ref 39.0–52.0)
Hemoglobin: 12.4 g/dL — ABNORMAL LOW (ref 13.0–17.0)
MCH: 33.8 pg (ref 26.0–34.0)
MCHC: 34 g/dL (ref 30.0–36.0)
MCV: 99.5 fL (ref 78.0–100.0)
PLATELETS: 205 10*3/uL (ref 150–400)
RBC: 3.67 MIL/uL — ABNORMAL LOW (ref 4.22–5.81)
RDW: 13.4 % (ref 11.5–15.5)
WBC: 3.5 10*3/uL — ABNORMAL LOW (ref 4.0–10.5)

## 2015-12-18 LAB — BASIC METABOLIC PANEL
ANION GAP: 10 (ref 5–15)
BUN: 14 mg/dL (ref 6–20)
CALCIUM: 9.6 mg/dL (ref 8.9–10.3)
CO2: 27 mmol/L (ref 22–32)
Chloride: 99 mmol/L — ABNORMAL LOW (ref 101–111)
Creatinine, Ser: 0.94 mg/dL (ref 0.61–1.24)
GFR calc Af Amer: >60 mL/min (ref >60)
GLUCOSE: 116 mg/dL — AB (ref 65–99)
Potassium: 4.6 mmol/L (ref 3.5–5.1)
Sodium: 136 mmol/L (ref 135–145)

## 2015-12-18 SURGERY — REPAIR, HERNIA, INGUINAL, ADULT
Anesthesia: Regional | Site: Groin | Laterality: Right

## 2015-12-18 MED ORDER — BUPIVACAINE-EPINEPHRINE (PF) 0.25% -1:200000 IJ SOLN
INTRAMUSCULAR | Status: AC
  Filled 2015-12-18: qty 30

## 2015-12-18 MED ORDER — MORPHINE SULFATE (PF) 2 MG/ML IV SOLN: 1.0000 mg | INTRAVENOUS | Status: DC | PRN

## 2015-12-18 MED ORDER — SUCCINYLCHOLINE CHLORIDE 20 MG/ML IJ SOLN
INTRAMUSCULAR | Status: AC
  Filled 2015-12-18: qty 1

## 2015-12-18 MED ORDER — BUPIVACAINE-EPINEPHRINE 0.25% -1:200000 IJ SOLN
INTRAMUSCULAR | Status: DC | PRN
  Administered 2015-12-18: 4 mL

## 2015-12-18 MED ORDER — FENTANYL CITRATE (PF) 250 MCG/5ML IJ SOLN
INTRAMUSCULAR | Status: AC
  Filled 2015-12-18: qty 5

## 2015-12-18 MED ORDER — PROPOFOL 10 MG/ML IV BOLUS
INTRAVENOUS | Status: DC | PRN
  Administered 2015-12-18: 90 mg via INTRAVENOUS

## 2015-12-18 MED ORDER — ACETAMINOPHEN 500 MG PO TABS
1000.0000 mg | ORAL_TABLET | Freq: Four times a day (QID) | ORAL | Status: DC
  Administered 2015-12-19: 1000 mg via ORAL
  Filled 2015-12-18 (×3): qty 2

## 2015-12-18 MED ORDER — ROCURONIUM BROMIDE 50 MG/5ML IV SOLN
INTRAVENOUS | Status: AC
  Filled 2015-12-18: qty 1

## 2015-12-18 MED ORDER — FENTANYL CITRATE (PF) 100 MCG/2ML IJ SOLN: 25.0000 ug | INTRAMUSCULAR | Status: DC | PRN

## 2015-12-18 MED ORDER — VANCOMYCIN HCL IN DEXTROSE 1-5 GM/200ML-% IV SOLN
INTRAVENOUS | Status: AC
  Administered 2015-12-19: 07:00:00
  Filled 2015-12-18: qty 200

## 2015-12-18 MED ORDER — CHLORHEXIDINE GLUCONATE 4 % EX LIQD: 1.0000 "application " | Freq: Once | CUTANEOUS | Status: DC

## 2015-12-18 MED ORDER — ALPRAZOLAM 0.25 MG PO TABS: 0.1250 mg | ORAL_TABLET | Freq: Every evening | ORAL | Status: DC | PRN

## 2015-12-18 MED ORDER — IBUPROFEN 600 MG PO TABS: 600.0000 mg | ORAL_TABLET | Freq: Four times a day (QID) | ORAL | Status: DC | PRN

## 2015-12-18 MED ORDER — LIDOCAINE HCL (CARDIAC) 20 MG/ML IV SOLN
INTRAVENOUS | Status: DC | PRN
  Administered 2015-12-18: 40 mg via INTRAVENOUS

## 2015-12-18 MED ORDER — TAMSULOSIN HCL 0.4 MG PO CAPS
0.4000 mg | ORAL_CAPSULE | Freq: Every day | ORAL | Status: DC
  Administered 2015-12-18 – 2015-12-19 (×2): 0.4 mg via ORAL
  Filled 2015-12-18 (×2): qty 1

## 2015-12-18 MED ORDER — EPHEDRINE SULFATE 50 MG/ML IJ SOLN
INTRAMUSCULAR | Status: AC
  Filled 2015-12-18: qty 1

## 2015-12-18 MED ORDER — HEPARIN SODIUM (PORCINE) 5000 UNIT/ML IJ SOLN
5000.0000 [IU] | Freq: Three times a day (TID) | INTRAMUSCULAR | Status: DC
  Administered 2015-12-19: 5000 [IU] via SUBCUTANEOUS
  Filled 2015-12-18: qty 1

## 2015-12-18 MED ORDER — ROCURONIUM BROMIDE 100 MG/10ML IV SOLN
INTRAVENOUS | Status: DC | PRN
  Administered 2015-12-18: 40 mg via INTRAVENOUS

## 2015-12-18 MED ORDER — PHENYLEPHRINE HCL 10 MG/ML IJ SOLN
10.0000 mg | INTRAVENOUS | Status: DC | PRN
  Administered 2015-12-18: 40 ug/min via INTRAVENOUS

## 2015-12-18 MED ORDER — OXYCODONE HCL 5 MG PO TABS: 2.5000 mg | ORAL_TABLET | ORAL | Status: DC | PRN

## 2015-12-18 MED ORDER — LACTATED RINGERS IV SOLN
INTRAVENOUS | Status: DC | PRN
  Administered 2015-12-18: 08:00:00 via INTRAVENOUS

## 2015-12-18 MED ORDER — ONDANSETRON 4 MG PO TBDP: 4.0000 mg | ORAL_TABLET | Freq: Four times a day (QID) | ORAL | Status: DC | PRN

## 2015-12-18 MED ORDER — IRBESARTAN 150 MG PO TABS
150.0000 mg | ORAL_TABLET | Freq: Every day | ORAL | Status: DC
  Administered 2015-12-19: 150 mg via ORAL
  Filled 2015-12-18: qty 1

## 2015-12-18 MED ORDER — POTASSIUM CHLORIDE IN NACL 20-0.9 MEQ/L-% IV SOLN
INTRAVENOUS | Status: DC
  Administered 2015-12-18: 16:00:00 via INTRAVENOUS
  Filled 2015-12-18 (×2): qty 1000

## 2015-12-18 MED ORDER — SODIUM CHLORIDE 0.9 % IJ SOLN
INTRAMUSCULAR | Status: AC
  Filled 2015-12-18: qty 10

## 2015-12-18 MED ORDER — SIMETHICONE 80 MG PO CHEW: 40.0000 mg | CHEWABLE_TABLET | Freq: Four times a day (QID) | ORAL | Status: DC | PRN

## 2015-12-18 MED ORDER — PROPOFOL 10 MG/ML IV BOLUS
INTRAVENOUS | Status: AC
  Filled 2015-12-18: qty 40

## 2015-12-18 MED ORDER — BUPIVACAINE-EPINEPHRINE (PF) 0.5% -1:200000 IJ SOLN
INTRAMUSCULAR | Status: DC | PRN
  Administered 2015-12-18: 30 mL

## 2015-12-18 MED ORDER — FENTANYL CITRATE (PF) 100 MCG/2ML IJ SOLN
INTRAMUSCULAR | Status: DC | PRN
  Administered 2015-12-18: 100 ug via INTRAVENOUS
  Administered 2015-12-18: 25 ug via INTRAVENOUS
  Administered 2015-12-18: 50 ug via INTRAVENOUS

## 2015-12-18 MED ORDER — PANTOPRAZOLE SODIUM 40 MG PO TBEC
40.0000 mg | DELAYED_RELEASE_TABLET | Freq: Two times a day (BID) | ORAL | Status: DC
Start: 2015-12-18 — End: 2015-12-19
  Administered 2015-12-18 – 2015-12-19 (×2): 40 mg via ORAL
  Filled 2015-12-18 (×2): qty 1

## 2015-12-18 MED ORDER — LIDOCAINE HCL (CARDIAC) 20 MG/ML IV SOLN
INTRAVENOUS | Status: AC
  Filled 2015-12-18: qty 5

## 2015-12-18 MED ORDER — LEVOTHYROXINE SODIUM 75 MCG PO TABS
75.0000 ug | ORAL_TABLET | Freq: Every day | ORAL | Status: DC
  Administered 2015-12-19: 75 ug via ORAL
  Filled 2015-12-18: qty 1

## 2015-12-18 MED ORDER — VANCOMYCIN HCL IN DEXTROSE 1-5 GM/200ML-% IV SOLN
1000.0000 mg | INTRAVENOUS | Status: AC
  Administered 2015-12-18: 1000 mg via INTRAVENOUS

## 2015-12-18 MED ORDER — ZOLPIDEM TARTRATE 5 MG PO TABS
5.0000 mg | ORAL_TABLET | Freq: Every evening | ORAL | Status: DC | PRN
  Administered 2015-12-18: 5 mg via ORAL
  Filled 2015-12-18: qty 1

## 2015-12-18 MED ORDER — PHENYLEPHRINE HCL 10 MG/ML IJ SOLN
INTRAMUSCULAR | Status: AC
  Filled 2015-12-18: qty 1

## 2015-12-18 MED ORDER — 0.9 % SODIUM CHLORIDE (POUR BTL) OPTIME
TOPICAL | Status: DC | PRN
  Administered 2015-12-18: 1000 mL

## 2015-12-18 MED ORDER — ONDANSETRON HCL 4 MG/2ML IJ SOLN
INTRAMUSCULAR | Status: DC | PRN
  Administered 2015-12-18: 4 mg via INTRAVENOUS

## 2015-12-18 MED ORDER — PHENYLEPHRINE HCL 10 MG/ML IJ SOLN
INTRAMUSCULAR | Status: DC | PRN
  Administered 2015-12-18 (×2): 80 ug via INTRAVENOUS
  Administered 2015-12-18: 120 ug via INTRAVENOUS

## 2015-12-18 MED ORDER — SUGAMMADEX SODIUM 200 MG/2ML IV SOLN
INTRAVENOUS | Status: AC
  Filled 2015-12-18: qty 2

## 2015-12-18 MED ORDER — ONDANSETRON HCL 4 MG/2ML IJ SOLN: 4.0000 mg | Freq: Four times a day (QID) | INTRAMUSCULAR | Status: DC | PRN

## 2015-12-18 MED ORDER — ALBUTEROL SULFATE (2.5 MG/3ML) 0.083% IN NEBU
2.5000 mg | INHALATION_SOLUTION | Freq: Four times a day (QID) | RESPIRATORY_TRACT | Status: DC | PRN
Start: 2015-12-18 — End: 2015-12-19

## 2015-12-18 MED ORDER — SUGAMMADEX SODIUM 200 MG/2ML IV SOLN
INTRAVENOUS | Status: DC | PRN
  Administered 2015-12-18: 120 mg via INTRAVENOUS

## 2015-12-18 SURGICAL SUPPLY — 57 items
APL SKNCLS STERI-STRIP NONHPOA (GAUZE/BANDAGES/DRESSINGS) ×1
BENZOIN TINCTURE PRP APPL 2/3 (GAUZE/BANDAGES/DRESSINGS) ×3 IMPLANT
BLADE SURG 10 STRL SS (BLADE) ×3 IMPLANT
BLADE SURG 15 STRL LF DISP TIS (BLADE) ×1 IMPLANT
BLADE SURG 15 STRL SS (BLADE) ×3
BLADE SURG ROTATE 9660 (MISCELLANEOUS) IMPLANT
CANISTER SUCTION 2500CC (MISCELLANEOUS) IMPLANT
CHLORAPREP W/TINT 26ML (MISCELLANEOUS) ×3 IMPLANT
CLOSURE WOUND 1/2 X4 (GAUZE/BANDAGES/DRESSINGS) ×1
COVER SURGICAL LIGHT HANDLE (MISCELLANEOUS) ×3 IMPLANT
DRAIN PENROSE 1/2X12 LTX STRL (WOUND CARE) ×2 IMPLANT
DRAPE LAPAROTOMY T 98X78 PEDS (DRAPES) ×3 IMPLANT
DRAPE LAPAROTOMY TRNSV 102X78 (DRAPE) IMPLANT
DRAPE UTILITY XL STRL (DRAPES) ×6 IMPLANT
DRSG TEGADERM 2-3/8X2-3/4 SM (GAUZE/BANDAGES/DRESSINGS) ×2 IMPLANT
DRSG TEGADERM 4X4.75 (GAUZE/BANDAGES/DRESSINGS) ×3 IMPLANT
ELECT CAUTERY BLADE 6.4 (BLADE) ×3 IMPLANT
ELECT REM PT RETURN 9FT ADLT (ELECTROSURGICAL) ×3
ELECTRODE REM PT RTRN 9FT ADLT (ELECTROSURGICAL) ×1 IMPLANT
GAUZE SPONGE 4X4 12PLY STRL (GAUZE/BANDAGES/DRESSINGS) ×3 IMPLANT
GLOVE BIO SURGEON STRL SZ7 (GLOVE) ×2 IMPLANT
GLOVE BIOGEL M STRL SZ7.5 (GLOVE) ×3 IMPLANT
GLOVE BIOGEL PI IND STRL 7.0 (GLOVE) IMPLANT
GLOVE BIOGEL PI IND STRL 8 (GLOVE) ×1 IMPLANT
GLOVE BIOGEL PI INDICATOR 7.0 (GLOVE) ×4
GLOVE BIOGEL PI INDICATOR 8 (GLOVE) ×2
GLOVE SURG SS PI 6.5 STRL IVOR (GLOVE) ×2 IMPLANT
GLOVE SURG SS PI 7.0 STRL IVOR (GLOVE) ×2 IMPLANT
GOWN STRL REUS W/ TWL LRG LVL3 (GOWN DISPOSABLE) ×1 IMPLANT
GOWN STRL REUS W/ TWL XL LVL3 (GOWN DISPOSABLE) ×1 IMPLANT
GOWN STRL REUS W/TWL LRG LVL3 (GOWN DISPOSABLE) ×6
GOWN STRL REUS W/TWL XL LVL3 (GOWN DISPOSABLE) ×3
KIT BASIN OR (CUSTOM PROCEDURE TRAY) ×3 IMPLANT
KIT ROOM TURNOVER OR (KITS) ×3 IMPLANT
MESH ULTRAPRO 3X6 7.6X15CM (Mesh General) ×2 IMPLANT
NDL HYPO 25GX1X1/2 BEV (NEEDLE) ×1 IMPLANT
NEEDLE HYPO 25GX1X1/2 BEV (NEEDLE) ×3 IMPLANT
NS IRRIG 1000ML POUR BTL (IV SOLUTION) ×3 IMPLANT
PACK SURGICAL SETUP 50X90 (CUSTOM PROCEDURE TRAY) ×3 IMPLANT
PAD ARMBOARD 7.5X6 YLW CONV (MISCELLANEOUS) ×3 IMPLANT
PENCIL BUTTON HOLSTER BLD 10FT (ELECTRODE) ×3 IMPLANT
SPECIMEN JAR SMALL (MISCELLANEOUS) IMPLANT
SPONGE INTESTINAL PEANUT (DISPOSABLE) ×2 IMPLANT
SPONGE LAP 18X18 X RAY DECT (DISPOSABLE) ×3 IMPLANT
STRIP CLOSURE SKIN 1/2X4 (GAUZE/BANDAGES/DRESSINGS) ×2 IMPLANT
SUT MNCRL AB 4-0 PS2 18 (SUTURE) ×3 IMPLANT
SUT PROLENE 2 0 CT2 30 (SUTURE) ×6 IMPLANT
SUT VIC AB 2-0 CT1 36 (SUTURE) ×3 IMPLANT
SUT VIC AB 3-0 SH 18 (SUTURE) ×3 IMPLANT
SUT VICRYL AB 3 0 TIES (SUTURE) ×3 IMPLANT
SYR BULB 3OZ (MISCELLANEOUS) ×3 IMPLANT
SYR CONTROL 10ML LL (SYRINGE) ×3 IMPLANT
TOWEL OR 17X24 6PK STRL BLUE (TOWEL DISPOSABLE) ×6 IMPLANT
TOWEL OR 17X26 10 PK STRL BLUE (TOWEL DISPOSABLE) ×1 IMPLANT
TUBE CONNECTING 12'X1/4 (SUCTIONS) ×1
TUBE CONNECTING 12X1/4 (SUCTIONS) ×1 IMPLANT
YANKAUER SUCT BULB TIP NO VENT (SUCTIONS) ×2 IMPLANT

## 2015-12-18 NOTE — Anesthesia Procedure Notes (Addendum)
Anesthesia Regional Block:  TAP block  Pre-Anesthetic Checklist: ,, timeout performed, Correct Patient, Correct Site, Correct Laterality, Correct Procedure, Correct Position, site marked, Risks and benefits discussed, pre-op evaluation, post-op pain management  Laterality: Right  Prep: Maximum Sterile Barrier Precautions used and chloraprep       Needles:  Injection technique: Single-shot  Needle Type: Echogenic Stimulator Needle     Needle Length: 9cm 9 cm Needle Gauge: 21 and 21 G    Additional Needles:  Procedures: ultrasound guided (picture in chart) TAP block Narrative:  Start time: 12/18/2015 8:10 AM End time: 12/18/2015 8:20 AM Injection made incrementally with aspirations every 5 mL. Anesthesiologist: Roderic Palau  Additional Notes: 2% Lidocaine skin wheel.   Procedure Name: Intubation Date/Time: 12/18/2015 8:38 AM Performed by: Salli Quarry Celicia Minahan Pre-anesthesia Checklist: Patient identified, Emergency Drugs available, Suction available and Patient being monitored Patient Re-evaluated:Patient Re-evaluated prior to inductionOxygen Delivery Method: Circle system utilized Preoxygenation: Pre-oxygenation with 100% oxygen Intubation Type: IV induction Ventilation: Mask ventilation without difficulty Laryngoscope Size: Glidescope and 4 Grade View: Grade I Tube type: Oral Tube size: 8.0 mm Number of attempts: 1 Airway Equipment and Method: Stylet Placement Confirmation: ETT inserted through vocal cords under direct vision,  positive ETCO2 and breath sounds checked- equal and bilateral Secured at: 23 cm Tube secured with: Tape Dental Injury: Teeth and Oropharynx as per pre-operative assessment

## 2015-12-18 NOTE — Transfer of Care (Signed)
Immediate Anesthesia Transfer of Care Note  Patient: Albert Allen  Procedure(s) Performed: Procedure(s): OPEN REPAIR RIGHT INGUINAL HERNIA  (Right) INSERTION OF MESH (Right)  Patient Location: PACU  Anesthesia Type:General  Level of Consciousness: awake, alert , oriented and patient cooperative  Airway & Oxygen Therapy: Patient Spontanous Breathing and Patient connected to nasal cannula oxygen  Post-op Assessment: Report given to RN and Post -op Vital signs reviewed and stable  Post vital signs: Reviewed and stable  Last Vitals:  Filed Vitals:   12/18/15 0659  BP: 159/63  Pulse: 80  Temp: 36.7 C  Resp: 16    Complications: No apparent anesthesia complications

## 2015-12-18 NOTE — Anesthesia Postprocedure Evaluation (Signed)
Anesthesia Post Note  Patient: Albert Allen  Procedure(s) Performed: Procedure(s) (LRB): OPEN REPAIR RIGHT INGUINAL HERNIA  (Right) INSERTION OF MESH (Right)  Patient location during evaluation: PACU Anesthesia Type: General and Regional Level of consciousness: awake and alert Pain management: pain level controlled Vital Signs Assessment: post-procedure vital signs reviewed and stable Respiratory status: spontaneous breathing, nonlabored ventilation, respiratory function stable and patient connected to nasal cannula oxygen Cardiovascular status: blood pressure returned to baseline and stable Postop Assessment: no signs of nausea or vomiting Anesthetic complications: no    Last Vitals:  Filed Vitals:   12/18/15 1115 12/18/15 1130  BP: 127/63 181/161  Pulse: 76 90  Temp:    Resp: 13 17    Last Pain:  Filed Vitals:   12/18/15 1140  PainSc: 1                  Madelyn Tlatelpa,W. EDMOND

## 2015-12-18 NOTE — Care Management Obs Status (Signed)
[facility] NOTIFICATION   Patient Details  Name: Albert Allen MRN: [id number] Date of Birth: [birth year]   Medicare Observation Status Notification Given:  Yes    Marilu Favre, RN 12/18/2015, 4:39 PM

## 2015-12-18 NOTE — Progress Notes (Signed)
Patient transferred from PACU. Patient stable, alert, and oriented. No complication at site. Family at bedside.

## 2015-12-18 NOTE — Op Note (Signed)
Albert Allen [id number] 02-20-1923 12/18/2015   Open Right Inguinal Hernia Repair with Mesh Procedure Note  Indications: The patient presented with a history of a right, reducible hernia.    Pre-operative Diagnosis: right reducible inguinal hernia  Post-operative Diagnosis: right indirect inguinal hernia  Surgeon: Gayland Curry   Assistants: none  Anesthesia: General endotracheal anesthesia and R TAP block  ASA Class: 3   Surgeon: Leighton Ruff. Redmond Pulling, MD, FACS  Procedure Details  The patient was seen again in the Holding Room. The risks, benefits, complications, treatment options, and expected outcomes were discussed with the patient. The possibilities of reaction to medication, pulmonary aspiration, perforation of viscus, bleeding, recurrent infection, the need for additional procedures, and development of a complication requiring transfusion or further operation were discussed with the patient and/or family. The likelihood of success in repairing the hernia and returning the patient to their previous functional status is good.  There was concurrence with the proposed plan, and informed consent was obtained. The site of surgery was properly noted/marked. The patient was taken to the Operating Room, identified as Albert Allen, and the procedure verified as right inguinal hernia repair. A Time Out was held and the above information confirmed.  The patient was placed in the supine position and underwent induction of anesthesia. The lower abdomen and groin was prepped with Chloraprep and draped in the standard fashion, and 0.25% Marcaine with epinephrine was used to anesthetize the skin over the mid-portion of the inguinal canal. An oblique incision was made. Dissection was carried down through the subcutaneous tissue with cautery to the external oblique fascia.  We opened the external oblique fascia along the direction of its fibers to the external ring. The external oblique was very  attenuated. The spermatic cord was circumferentially dissected bluntly and retracted with a Penrose drain.  The floor of the inguinal canal was inspected and there was no direct defect.  We skeletonized the spermatic cord and isolated the hernia sac from the cord structures.  We used a 3 x 6 inch piece of Ultrapro mesh, which was cut into a keyhole shape.  This was secured with 2-0 Prolene, beginning at the pubic tubercle, running this along the shelving edge inferiorly. Superiorly, the mesh was secured to the internal oblique fascia with interrupted 2-0 Prolene sutures.  The tails of the mesh were sutured together behind the spermatic cord.  The mesh was tucked underneath the external oblique fascia laterally.  The external oblique fascia was reapproximated with 2-0 Vicryl.  3-0 Vicryl was used to close the subcutaneous tissues and 4-0 Monocryl was used to close the skin in subcuticular fashion.  Benzoin and steri-strips were used to seal the incision.  A clean dressing was applied.  The patient was then extubated and brought to the recovery room in stable condition.  All sponge, instrument, and needle counts were correct prior to closure and at the conclusion of the case.   Estimated Blood Loss: Minimal                 Complications: None; patient tolerated the procedure well.         Disposition: PACU - hemodynamically stable.         Condition: stable  Leighton Ruff. Redmond Pulling, MD, FACS General, Bariatric, & Minimally Invasive Surgery [facility], [place]

## 2015-12-18 NOTE — H&P (Signed)
Albert Allen is an 82 y.o. male.   Chief Complaint: here for surgery HPI: 82yo WM comes in for planned repair of symptomatic right inguinal hernia. i have seen him on 2 occassions in the office. His hernia was getting more and more symptomatic. He has a heart history and cardiology saw him and felt he was moderate risk but given how symptomatic his hernia was (see office notes) pt and I felt surgery was best option. His sinusitis has resolved. O/w denies any changes since I last saw him in November in office  Past Medical History  Diagnosis Date  . HYPOTHYROIDISM 05/31/2007  . HYPERLIPIDEMIA 03/27/2009  . GOUT 05/31/2007  . ANEMIA, B12 DEFICIENCY 03/29/2008  . Anxiety state, unspecified 11/19/2008  . ABUSE, ALCOHOL, IN REMISSION 05/31/2007  . CARPAL TUNNEL SYNDROME, RIGHT 09/05/2007  . 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 HYPERTENSION 05/31/2007  . CAD, NATIVE VESSEL 04/04/2009  . LEFT BUNDLE BRANCH BLOCK 02/11/2009  . SINUSITIS, CHRONIC NOS 05/31/2007  . ASTHMA 05/31/2007  . GERD 05/31/2007  . Chronic prostatitis 03/29/2008  . LOC OSTEOARTHROS NOT SPEC PRIM/SEC LOWER LEG 03/20/2010  . OSTEOARTHRITIS 05/31/2007  . HIP PAIN 03/20/2010  . DEGENERATIVE DISC DISEASE, CERVICAL SPINE 06/22/2007  . Adhesive capsulitis of shoulder 12/29/2007  . SYNCOPE 11/19/2008  . WEIGHT LOSS, RECENT 11/19/2008  . DYSPHAGIA UNSPECIFIED 09/25/2008  . Hepatomegaly 09/25/2008  . PSA, INCREASED 03/20/2010  . ABNORMAL CV (STRESS) TEST 03/05/2009  . PERSONAL HX COLONIC POLYPS 03/05/1997    adenomatous   . Liver mass, left lobe   . Hiatal hernia   . Esophageal stricture   . Sore throat     CURRENTLY    Past Surgical History  Procedure Laterality Date  . Pul. colapse w/chest tube    . Tonsillectomy    . Carpel tunnel release    . Orif ulnar fracture  11/03/2012    Procedure: OPEN REDUCTION INTERNAL FIXATION (ORIF) ULNAR FRACTURE;  Surgeon: Hessie Dibble, MD;  Location: [facility];  Service: Orthopedics;  Laterality: Right;   ORIF radius and ulnar fractures  . Eye surgery      Family History  Problem Relation Age of Onset  . Cancer Mother     In back but unsure of what type of cancer  . Alcohol abuse Father   . Lung disease Father     abscess  . Colon cancer Neg Hx   . Diabetes Sister   . Cancer Brother     throat  . Diabetes Brother   . Diabetes Sister    Social History:  reports that he quit smoking about 17 years ago. His smoking use included Cigarettes. He has never used smokeless tobacco. He reports that he does not drink alcohol or use illicit drugs.  Allergies:  Allergies  Allergen Reactions  . Amoxicillin Swelling and Other (See Comments)    Pt has swelling with PCN's  . Penicillins Swelling    Lips swelling Has patient had a PCN reaction causing immediate rash, facial/tongue/throat swelling, SOB or lightheadedness with hypotension: No Has patient had a PCN reaction causing severe rash involving mucus membranes or skin necrosis: No Has patient had a PCN reaction that required hospitalization No Has patient had a PCN reaction occurring within the last 10 years: No If all of the above answers are "NO", then may proceed with Cephalosporin use.  Blair Dolphin [Cefaclor] Other (See Comments)    Pt does not remember reaction  . Celebrex [Celecoxib] Other (See Comments)  Pt does not remember reaction  . Levaquin [Levofloxacin In D5w] Other (See Comments)    Pt does not remember reaction    Medications Prior to Admission  Medication Sig Dispense Refill  . ALPRAZolam (XANAX) 0.25 MG tablet Take 0.125 mg by mouth at bedtime as needed for sleep.     Marland Kitchen aspirin EC 81 MG tablet Take 81 mg by mouth daily.    Marland Kitchen azithromycin (ZITHROMAX) 250 MG tablet Take 2 the first day and then one each day after. 6 tablet 0  . cholecalciferol (VITAMIN D) 1000 units tablet Take 1,000 Units by mouth daily.    . Cyanocobalamin (VITAMIN B-12 PO) Take by mouth.    . levothyroxine (SYNTHROID, LEVOTHROID) 75 MCG tablet  Take 1 tablet (75 mcg total) by mouth daily. (Patient taking differently: Take 75 mcg by mouth daily before breakfast. ) 90 tablet 3  . naproxen sodium (ALEVE) 220 MG tablet Take 440 mg by mouth daily as needed (pain).    . pantoprazole (PROTONIX) 40 MG tablet Take 40 mg by mouth 2 (two) times daily.    . pravastatin (PRAVACHOL) 40 MG tablet Take 1 tablet (40 mg total) by mouth daily. 90 tablet 3  . PROAIR HFA 108 (90 Base) MCG/ACT inhaler INHALE 2 PUFFS INTO THE LUNGS EVERY 6 (SIX) HOURS AS  NEEDED FOR WHEEZING OR SHORTNESS OF BREATH. (Patient taking differently: INHALE 1 PUFFS INTO THE LUNGS EVERY 6 (SIX) HOURS AS  NEEDED FOR WHEEZING OR SHORTNESS OF BREATH.) 9 g 2  . tamsulosin (FLOMAX) 0.4 MG CAPS capsule TAKE 1 CAPSULE (0.4 MG TOTAL) BY MOUTH DAILY AFTER SUPPER. 30 capsule 2  . Tetrahydrozoline HCl (VISINE OP) Place 1 drop into both eyes daily as needed (dry eyes).    . valsartan (DIOVAN) 160 MG tablet TAKE ONE TABLET BY MOUTH ONE TIME DAILY 90 tablet 0  . GuaiFENesin (MUCINEX PO) Take 1 tablet by mouth 2 (two) times daily.      Results for orders placed or performed during the hospital encounter of 12/18/15 (from the past 48 hour(s))  CBC     Status: Abnormal   Collection Time: 12/18/15  6:47 AM  Result Value Ref Range   WBC 3.5 (L) 4.0 - 10.5 K/uL   RBC 3.67 (L) 4.22 - 5.81 MIL/uL   Hemoglobin 12.4 (L) 13.0 - 17.0 g/dL   HCT 36.5 (L) 39.0 - 52.0 %   MCV 99.5 78.0 - 100.0 fL   MCH 33.8 26.0 - 34.0 pg   MCHC 34.0 30.0 - 36.0 g/dL   RDW 13.4 11.5 - 15.5 %   Platelets 205 150 - 400 K/uL  Basic metabolic panel     Status: Abnormal   Collection Time: 12/18/15  6:47 AM  Result Value Ref Range   Sodium 136 135 - 145 mmol/L   Potassium 4.6 3.5 - 5.1 mmol/L   Chloride 99 (L) 101 - 111 mmol/L   CO2 27 22 - 32 mmol/L   Glucose, Bld 116 (H) 65 - 99 mg/dL   BUN 14 6 - 20 mg/dL   Creatinine, Ser 0.94 0.61 - 1.24 mg/dL   Calcium 9.6 8.9 - 10.3 mg/dL   GFR calc non Af Amer >60 >60 mL/min    GFR calc Af Amer >60 >60 mL/min    Comment: (NOTE) The eGFR has been calculated using the CKD EPI equation. This calculation has not been validated in all clinical situations. eGFR's persistently <60 mL/min signify possible Chronic Kidney Disease.  Anion gap 10 5 - 15   No results found.  Review of Systems  HENT: Positive for congestion (surgery had been cancelled from earlier in the month due to sinusitis; now asymptomatic, symptoms resovled).   Respiratory: Negative for shortness of breath.   Cardiovascular: Negative for chest pain, palpitations and leg swelling.  Gastrointestinal: Positive for nausea. Negative for vomiting, abdominal pain and diarrhea.  Genitourinary: Negative for dysuria.       See hpi  Neurological: Negative for dizziness, seizures and loss of consciousness.  Psychiatric/Behavioral: Negative for substance abuse.  All other systems reviewed and are negative.   Blood pressure 159/63, pulse 80, temperature 98 F (36.7 C), temperature source Oral, resp. rate 16, height '5\' 7"'$  (1.702 m), weight 61.78 kg (136 lb 3.2 oz), SpO2 99 %. Physical Exam  Vitals reviewed. Constitutional: He is oriented to person, place, and time. He appears well-developed and well-nourished. No distress.  HENT:  Head: Normocephalic and atraumatic.  Right Ear: External ear normal.  Left Ear: External ear normal.  Eyes: Conjunctivae are normal. No scleral icterus.  Neck: Normal range of motion. Neck supple. No tracheal deviation present.  Cardiovascular: Normal rate and normal heart sounds.   Respiratory: Effort normal and breath sounds normal. No stridor. No respiratory distress.  GI: Soft. He exhibits no distension. There is no tenderness.  Genitourinary:  Prominent Right groin bulge, reducible; both testicles down  Musculoskeletal: He exhibits no edema or tenderness.  Neurological: He is alert and oriented to person, place, and time. He exhibits normal muscle tone.  Skin: Skin is  warm and dry. No rash noted. He is not diaphoretic. No erythema. No pallor.  Psychiatric: He has a normal mood and affect. His behavior is normal. Judgment and thought content normal.     Assessment/Plan Right inguinal hernia CAD BPH Asthma HPL  To OR for open repair of right inguinal hernia with mesh All questions asked and answered Anesthesia to place TAP block scds abx on call Plan overnight stay given age and comorbidities  Leighton Ruff. Redmond Pulling, MD, FACS General, Bariatric, & Minimally Invasive Surgery [facility], [place]   [facility] M 12/18/2015, 7:51 AM

## 2015-12-19 ENCOUNTER — Encounter (HOSPITAL_COMMUNITY): Payer: Self-pay | Admitting: General Surgery

## 2015-12-19 DIAGNOSIS — K219 Gastro-esophageal reflux disease without esophagitis: Secondary | ICD-10-CM | POA: Diagnosis not present

## 2015-12-19 DIAGNOSIS — Z87891 Personal history of nicotine dependence: Secondary | ICD-10-CM | POA: Diagnosis not present

## 2015-12-19 DIAGNOSIS — J45909 Unspecified asthma, uncomplicated: Secondary | ICD-10-CM | POA: Diagnosis not present

## 2015-12-19 DIAGNOSIS — N400 Enlarged prostate without lower urinary tract symptoms: Secondary | ICD-10-CM | POA: Diagnosis not present

## 2015-12-19 DIAGNOSIS — K4090 Unilateral inguinal hernia, without obstruction or gangrene, not specified as recurrent: Secondary | ICD-10-CM | POA: Diagnosis not present

## 2015-12-19 DIAGNOSIS — E039 Hypothyroidism, unspecified: Secondary | ICD-10-CM | POA: Diagnosis not present

## 2015-12-19 DIAGNOSIS — I2510 Atherosclerotic heart disease of native coronary artery without angina pectoris: Secondary | ICD-10-CM | POA: Diagnosis not present

## 2015-12-19 DIAGNOSIS — Z880 Allergy status to penicillin: Secondary | ICD-10-CM | POA: Diagnosis not present

## 2015-12-19 DIAGNOSIS — I10 Essential (primary) hypertension: Secondary | ICD-10-CM | POA: Diagnosis not present

## 2015-12-19 DIAGNOSIS — E785 Hyperlipidemia, unspecified: Secondary | ICD-10-CM | POA: Diagnosis not present

## 2015-12-19 DIAGNOSIS — Z79899 Other long term (current) drug therapy: Secondary | ICD-10-CM | POA: Diagnosis not present

## 2015-12-19 DIAGNOSIS — Z7982 Long term (current) use of aspirin: Secondary | ICD-10-CM | POA: Diagnosis not present

## 2015-12-19 LAB — BASIC METABOLIC PANEL
ANION GAP: 5 (ref 5–15)
BUN: 10 mg/dL (ref 6–20)
CO2: 26 mmol/L (ref 22–32)
Calcium: 8.9 mg/dL (ref 8.9–10.3)
Chloride: 104 mmol/L (ref 101–111)
Creatinine, Ser: 0.97 mg/dL (ref 0.61–1.24)
GFR calc Af Amer: >60 mL/min (ref >60)
GFR calc non Af Amer: >60 mL/min (ref >60)
GLUCOSE: 105 mg/dL — AB (ref 65–99)
Potassium: 4.7 mmol/L (ref 3.5–5.1)
Sodium: 135 mmol/L (ref 135–145)

## 2015-12-19 LAB — CBC
HEMATOCRIT: 32.5 % — AB (ref 39.0–52.0)
Hemoglobin: 11.1 g/dL — ABNORMAL LOW (ref 13.0–17.0)
MCH: 33.6 pg (ref 26.0–34.0)
MCHC: 34.2 g/dL (ref 30.0–36.0)
MCV: 98.5 fL (ref 78.0–100.0)
Platelets: 174 10*3/uL (ref 150–400)
RBC: 3.3 MIL/uL — ABNORMAL LOW (ref 4.22–5.81)
RDW: 13.5 % (ref 11.5–15.5)
WBC: 5.2 10*3/uL (ref 4.0–10.5)

## 2015-12-19 MED ORDER — OXYCODONE HCL 5 MG PO TABS: 2.5000 mg | ORAL_TABLET | ORAL | Status: DC | PRN

## 2015-12-19 NOTE — Discharge Summary (Signed)
Physician Discharge Summary  Albert Allen [id number] DOB: 12/30/1922 DOA: 12/18/2015  PCP: [name], MD  Admit date: 12/18/2015 Discharge date: 12/19/2015  Recommendations for Outpatient Follow-up:   Follow-up Information    Follow up with Gayland Curry, MD On 01/09/2016.   Specialty:  General Surgery   Why:  11:15 AM (arrive at 11 AM please)   Contact information:   [address] [place] [address] [phone]      Discharge Diagnoses:  Principal Problem:   Right inguinal hernia Active Problems:   Essential hypertension   CAD, NATIVE VESSEL   Asthma  Surgical Procedure: open repair of right indirect inguinal hernia with mesh 1/18  Discharge Condition: good Disposition: home  Diet recommendation: cardiac  Filed Weights   12/18/15 0647 12/18/15 1442  Weight: 61.78 kg (136 lb 3.2 oz) 62.143 kg (137 lb)    Hospital Course:  He was brought in for planned open repair of RIH. Please see op note for additional details. Anesthesia placed a right TAP block preoperatively for pain control. He did well. He voided after surgery without difficulty. On POD 1 he was doing well. His pain was well controlled. His vitals were stable. He had no chest pain/sob. He had some mild swelling in right groin as expected. He was tolerating a diet and was deemed stable for dc. We discussed dc instructions.  BP 99/61 mmHg  Pulse 86  Temp(Src) 98.4 F (36.9 C) (Oral)  Resp 18  Ht 5\' 7"  (1.702 m)  Wt 62.143 kg (137 lb)  BMI 21.45 kg/m2  SpO2 99%  Gen: alert, NAD, non-toxic appearing Pupils: equal, no scleral icterus Pulm: Lungs clear to auscultation, symmetric chest rise CV: regular rate and rhythm Abd: soft, nontender, nondistended.  GU: Rt groin - dressing c/d/i; mild swelling. Mild to nontender Ext: no edema, no calf tenderness Skin: no rash, no jaundice    Discharge Instructions  Discharge Instructions    Call MD for:  difficulty breathing, headache or  visual disturbances    Complete by:  As directed      Call MD for:  persistant dizziness or light-headedness    Complete by:  As directed      Call MD for:  persistant nausea and vomiting    Complete by:  As directed      Call MD for:  redness, tenderness, or signs of infection (pain, swelling, redness, odor or green/yellow discharge around incision site)    Complete by:  As directed      Call MD for:  severe uncontrolled pain    Complete by:  As directed      Call MD for:    Complete by:  As directed   Temp >101     Diet - low sodium heart healthy    Complete by:  As directed      Discharge instructions    Complete by:  As directed   See [facility] discharge instructions     Increase activity slowly    Complete by:  As directed      Lifting restrictions    Complete by:  As directed   DO NOT LIFT, PUSH, OR PULL ANYTHING GREATER THAN 10 POUNDS FOR 6 WEEKS            Medication List    STOP taking these medications        azithromycin 250 MG tablet  Commonly known as:  ZITHROMAX      TAKE these medications  ALEVE 220 MG tablet  Generic drug:  naproxen sodium  Take 440 mg by mouth daily as needed (pain).     ALPRAZolam 0.25 MG tablet  Commonly known as:  XANAX  Take 0.125 mg by mouth at bedtime as needed for sleep.     aspirin EC 81 MG tablet  Take 81 mg by mouth daily.     cholecalciferol 1000 units tablet  Commonly known as:  VITAMIN D  Take 1,000 Units by mouth daily.     levothyroxine 75 MCG tablet  Commonly known as:  SYNTHROID, LEVOTHROID  Take 1 tablet (75 mcg total) by mouth daily.     MUCINEX PO  Take 1 tablet by mouth 2 (two) times daily.     oxyCODONE 5 MG immediate release tablet  Commonly known as:  Oxy IR/ROXICODONE  Take 0.5-1 tablets (2.5-5 mg total) by mouth every 4 (four) hours as needed for moderate pain.     pantoprazole 40 MG tablet  Commonly known as:  PROTONIX  Take 40 mg by mouth 2 (two) times daily.     pravastatin 40 MG tablet   Commonly known as:  PRAVACHOL  Take 1 tablet (40 mg total) by mouth daily.     PROAIR HFA 108 (90 Base) MCG/ACT inhaler  Generic drug:  albuterol  INHALE 2 PUFFS INTO THE LUNGS EVERY 6 (SIX) HOURS AS  NEEDED FOR WHEEZING OR SHORTNESS OF BREATH.     tamsulosin 0.4 MG Caps capsule  Commonly known as:  FLOMAX  TAKE 1 CAPSULE (0.4 MG TOTAL) BY MOUTH DAILY AFTER SUPPER.     valsartan 160 MG tablet  Commonly known as:  DIOVAN  TAKE ONE TABLET BY MOUTH ONE TIME DAILY     VISINE OP  Place 1 drop into both eyes daily as needed (dry eyes).     VITAMIN B-12 PO  Take by mouth.           Follow-up Information    Follow up with Gayland Curry, MD On 01/09/2016.   Specialty:  General Surgery   Why:  11:15 AM (arrive at 11 AM please)   Contact information:   [address]  [place] [address] [phone]        The results of significant diagnostics from this hospitalization (including imaging, microbiology, ancillary and laboratory) are listed below for reference.    Significant Diagnostic Studies: No results found.  Microbiology: No results found for this or any previous visit (from the past 240 hour(s)).   Labs: Basic Metabolic Panel:  Recent Labs Lab 12/18/15 0647 12/19/15 0435  NA 136 135  K 4.6 4.7  CL 99* 104  CO2 27 26  GLUCOSE 116* 105*  BUN 14 10  CREATININE 0.94 0.97  CALCIUM 9.6 8.9   Liver Function Tests: No results for input(s): AST, ALT, ALKPHOS, BILITOT, PROT, ALBUMIN in the last 168 hours. No results for input(s): LIPASE, AMYLASE in the last 168 hours. No results for input(s): AMMONIA in the last 168 hours. CBC:  Recent Labs Lab 12/18/15 0647 12/19/15 0435  WBC 3.5* 5.2  HGB 12.4* 11.1*  HCT 36.5* 32.5*  MCV 99.5 98.5  PLT 205 174   Cardiac Enzymes: No results for input(s): CKTOTAL, CKMB, CKMBINDEX, TROPONINI in the last 168 hours. BNP: BNP (last 3 results) No results for input(s): BNP in the last 8760 hours.  ProBNP  (last 3 results) No results for input(s): PROBNP in the last 8760 hours.  CBG: No results for  input(s): GLUCAP in the last 168 hours.  Principal Problem:   Right inguinal hernia Active Problems:   Essential hypertension   CAD, NATIVE VESSEL   Asthma   Time coordinating discharge: 15 minutes  Signed:  Gayland Curry, MD [facility], [place] [phone] 12/19/2015, 9:28 AM

## 2015-12-19 NOTE — Progress Notes (Signed)
IV removed. AVS given to patient, wife, and son.  Understanding demonstrated. Belongings packed. Transportation arranged with family.

## 2015-12-19 NOTE — Discharge Instructions (Signed)
[facility] ° °UMBILICAL OR INGUINAL HERNIA REPAIR: POST OP INSTRUCTIONS ° °Always review your discharge instruction sheet given to you by the facility where your surgery was performed. °IF YOU HAVE DISABILITY OR FAMILY LEAVE FORMS, YOU MUST BRING THEM TO THE OFFICE FOR PROCESSING.   °DO NOT GIVE THEM TO YOUR DOCTOR. ° °1. A  prescription for pain medication may be given to you upon discharge.  Take your pain medication as prescribed, if needed.  If narcotic pain medicine is not needed, then you may take acetaminophen (Tylenol) or ibuprofen (Advil) as needed. °2. Take your usually prescribed medications unless otherwise directed. °3. If you need a refill on your pain medication, please contact your pharmacy.  They will contact our office to request authorization. Prescriptions will not be filled after 5 pm or on week-ends. °4. You should follow a light diet the first 24 hours after arrival home, such as soup and crackers, etc.  Be sure to include lots of fluids daily.  Resume your normal diet the day after surgery. °5. Most patients will experience some swelling and bruising around the umbilicus or in the groin and scrotum.  Ice packs and reclining will help.  Swelling and bruising can take several days to resolve.  °6. It is common to experience some constipation if taking pain medication after surgery.  Increasing fluid intake and taking a stool softener (such as Colace) will usually help or prevent this problem from occurring.  A mild laxative (Milk of Magnesia or Miralax) should be taken according to package directions if there are no bowel movements after 48 hours. °7. Unless discharge instructions indicate otherwise, you may remove your bandages 48 hours after surgery, and you may shower at that time.  You  have steri-strips (small skin tapes) in place directly over the incision.  These strips should be left on the skin for 7-10 days. . °8. ACTIVITIES:  You may resume regular (light) daily  activities beginning the next day--such as daily self-care, walking, climbing stairs--gradually increasing activities as tolerated.  You may have sexual intercourse when it is comfortable.  Refrain from any heavy lifting or straining until approved by your doctor. °a. You may drive when you are no longer taking prescription pain medication, you can comfortably wear a seatbelt, and you can safely maneuver your car and apply brakes. °b. RETURN TO WORK:  °9. You should see your doctor in the office for a follow-up appointment approximately 2-3 weeks after your surgery.  Make sure that you call for this appointment within a day or two after you arrive home to insure a convenient appointment time. °10. OTHER INSTRUCTIONS: DO NOT LIFT, PUSH, OR PULL ANYTHING GREATER THAN 10 POUNDS FOR 6 WEEKS  ° ° °WHEN TO CALL YOUR DOCTOR: °1. Fever over 101.0 °2. Inability to urinate °3. Nausea and/or vomiting °4. Extreme swelling or bruising °5. Continued bleeding from incision. °6. Increased pain, redness, or drainage from the incision ° °The clinic staff is available to answer your questions during regular business hours.  Please don’t hesitate to call and ask to speak to one of the nurses for clinical concerns.  If you have a medical emergency, go to the nearest emergency room or call 911.  A surgeon from [facility] is always on call at the hospital ° ° °[address], [address], [place], [place]  [address] ? ° [address] [address], , [place]   [address] °[phone] ? [phone] ? FAX [phone] °  [phone] Web site: [email or web address]

## 2015-12-31 ENCOUNTER — Other Ambulatory Visit: Payer: Self-pay | Admitting: *Deleted

## 2015-12-31 DIAGNOSIS — J206 Acute bronchitis due to rhinovirus: Secondary | ICD-10-CM

## 2015-12-31 MED ORDER — LEVOTHYROXINE SODIUM 75 MCG PO TABS: 75.0000 ug | ORAL_TABLET | Freq: Every day | ORAL | Status: DC

## 2016-01-07 ENCOUNTER — Other Ambulatory Visit: Payer: Self-pay | Admitting: *Deleted

## 2016-01-07 DIAGNOSIS — E785 Hyperlipidemia, unspecified: Secondary | ICD-10-CM

## 2016-01-07 MED ORDER — PRAVASTATIN SODIUM 40 MG PO TABS: 40.0000 mg | ORAL_TABLET | Freq: Every day | ORAL | Status: DC

## 2016-02-06 ENCOUNTER — Encounter (HOSPITAL_COMMUNITY): Payer: Self-pay | Admitting: Emergency Medicine

## 2016-02-06 ENCOUNTER — Emergency Department (HOSPITAL_COMMUNITY): Payer: Medicare Other

## 2016-02-06 DIAGNOSIS — R079 Chest pain, unspecified: Secondary | ICD-10-CM | POA: Diagnosis not present

## 2016-02-06 DIAGNOSIS — I2510 Atherosclerotic heart disease of native coronary artery without angina pectoris: Secondary | ICD-10-CM | POA: Diagnosis not present

## 2016-02-06 DIAGNOSIS — I10 Essential (primary) hypertension: Secondary | ICD-10-CM | POA: Insufficient documentation

## 2016-02-06 DIAGNOSIS — Z8669 Personal history of other diseases of the nervous system and sense organs: Secondary | ICD-10-CM | POA: Diagnosis not present

## 2016-02-06 DIAGNOSIS — Z87891 Personal history of nicotine dependence: Secondary | ICD-10-CM | POA: Insufficient documentation

## 2016-02-06 DIAGNOSIS — Z880 Allergy status to penicillin: Secondary | ICD-10-CM | POA: Diagnosis not present

## 2016-02-06 DIAGNOSIS — Z86010 Personal history of colonic polyps: Secondary | ICD-10-CM | POA: Insufficient documentation

## 2016-02-06 DIAGNOSIS — Z79891 Long term (current) use of opiate analgesic: Secondary | ICD-10-CM | POA: Insufficient documentation

## 2016-02-06 DIAGNOSIS — R0602 Shortness of breath: Secondary | ICD-10-CM | POA: Diagnosis not present

## 2016-02-06 DIAGNOSIS — K219 Gastro-esophageal reflux disease without esophagitis: Secondary | ICD-10-CM | POA: Insufficient documentation

## 2016-02-06 DIAGNOSIS — J441 Chronic obstructive pulmonary disease with (acute) exacerbation: Secondary | ICD-10-CM | POA: Diagnosis not present

## 2016-02-06 DIAGNOSIS — Z8659 Personal history of other mental and behavioral disorders: Secondary | ICD-10-CM | POA: Insufficient documentation

## 2016-02-06 DIAGNOSIS — E039 Hypothyroidism, unspecified: Secondary | ICD-10-CM | POA: Insufficient documentation

## 2016-02-06 DIAGNOSIS — Z7982 Long term (current) use of aspirin: Secondary | ICD-10-CM | POA: Insufficient documentation

## 2016-02-06 DIAGNOSIS — N411 Chronic prostatitis: Secondary | ICD-10-CM | POA: Diagnosis not present

## 2016-02-06 DIAGNOSIS — D519 Vitamin B12 deficiency anemia, unspecified: Secondary | ICD-10-CM | POA: Insufficient documentation

## 2016-02-06 DIAGNOSIS — Z79899 Other long term (current) drug therapy: Secondary | ICD-10-CM | POA: Insufficient documentation

## 2016-02-06 DIAGNOSIS — M1990 Unspecified osteoarthritis, unspecified site: Secondary | ICD-10-CM | POA: Diagnosis not present

## 2016-02-06 DIAGNOSIS — R0789 Other chest pain: Secondary | ICD-10-CM | POA: Diagnosis not present

## 2016-02-06 DIAGNOSIS — E785 Hyperlipidemia, unspecified: Secondary | ICD-10-CM | POA: Diagnosis not present

## 2016-02-06 LAB — BASIC METABOLIC PANEL
ANION GAP: 10 (ref 5–15)
BUN: 15 mg/dL (ref 6–20)
CALCIUM: 9.7 mg/dL (ref 8.9–10.3)
CO2: 25 mmol/L (ref 22–32)
CREATININE: 1.04 mg/dL (ref 0.61–1.24)
Chloride: 96 mmol/L — ABNORMAL LOW (ref 101–111)
Glucose, Bld: 159 mg/dL — ABNORMAL HIGH (ref 65–99)
Potassium: 4.5 mmol/L (ref 3.5–5.1)
SODIUM: 131 mmol/L — AB (ref 135–145)

## 2016-02-06 LAB — CBC
HCT: 35.8 % — ABNORMAL LOW (ref 39.0–52.0)
HEMOGLOBIN: 12.2 g/dL — AB (ref 13.0–17.0)
MCH: 33 pg (ref 26.0–34.0)
MCHC: 34.1 g/dL (ref 30.0–36.0)
MCV: 96.8 fL (ref 78.0–100.0)
PLATELETS: 181 10*3/uL (ref 150–400)
RBC: 3.7 MIL/uL — AB (ref 4.22–5.81)
RDW: 13.4 % (ref 11.5–15.5)
WBC: 3.9 10*3/uL — ABNORMAL LOW (ref 4.0–10.5)

## 2016-02-06 LAB — I-STAT TROPONIN, ED: TROPONIN I, POC: 0 ng/mL (ref 0.00–0.08)

## 2016-02-06 NOTE — ED Notes (Signed)
Pt sent here from [facility] with sob and chest tightness over past several days worse today. Denies fevers.

## 2016-02-07 ENCOUNTER — Telehealth: Payer: Self-pay | Admitting: Cardiovascular Disease

## 2016-02-07 ENCOUNTER — Emergency Department (HOSPITAL_COMMUNITY)
Admission: EM | Admit: 2016-02-07 | Discharge: 2016-02-07 | Disposition: A | Discharge status: Home / Self Care | Payer: Medicare Other | Attending: Emergency Medicine | Admitting: Emergency Medicine

## 2016-02-07 DIAGNOSIS — R0789 Other chest pain: Secondary | ICD-10-CM

## 2016-02-07 NOTE — ED Notes (Signed)
Patient states he was working in his tractor shed today, push a potato plow (weighs over 100lbs)  - but states he did not pick it up, used the blower to move leaves.  Family member states he really hasn't done this much work since he had hernia surgery about 7 weeks ago.

## 2016-02-07 NOTE — Telephone Encounter (Signed)
New message      Pt was seen in the ER last night because of chest tightness.  He was instructed to call our office this am and let someone know.  Please look at ER notes and call pt.  Should pt be seen sooner than appt.

## 2016-02-07 NOTE — Discharge Instructions (Signed)
Call Dr. Reatha Armour office in the morning to arrange a follow up appointment in the next 3 days.  Return to the emergency department if your symptoms significantly worsen or change.   Nonspecific Chest Pain  Chest pain can be caused by many different conditions. There is always a chance that your pain could be related to something serious, such as a heart attack or a blood clot in your lungs. Chest pain can also be caused by conditions that are not life-threatening. If you have chest pain, it is very important to follow up with your health care provider. CAUSES  Chest pain can be caused by:  Heartburn.  Pneumonia or bronchitis.  Anxiety or stress.  Inflammation around your heart (pericarditis) or lung (pleuritis or pleurisy).  A blood clot in your lung.  A collapsed lung (pneumothorax). It can develop suddenly on its own (spontaneous pneumothorax) or from trauma to the chest.  Shingles infection (varicella-zoster virus).  Heart attack.  Damage to the bones, muscles, and cartilage that make up your chest wall. This can include:  Bruised bones due to injury.  Strained muscles or cartilage due to frequent or repeated coughing or overwork.  Fracture to one or more ribs.  Sore cartilage due to inflammation (costochondritis). RISK FACTORS  Risk factors for chest pain may include:  Activities that increase your risk for trauma or injury to your chest.  Respiratory infections or conditions that cause frequent coughing.  Medical conditions or overeating that can cause heartburn.  Heart disease or family history of heart disease.  Conditions or health behaviors that increase your risk of developing a blood clot.  Having had chicken pox (varicella zoster). SIGNS AND SYMPTOMS Chest pain can feel like:  Burning or tingling on the surface of your chest or deep in your chest.  Crushing, pressure, aching, or squeezing pain.  Dull or sharp pain that is worse when you move,  cough, or take a deep breath.  Pain that is also felt in your back, neck, shoulder, or arm, or pain that spreads to any of these areas. Your chest pain may come and go, or it may stay constant. DIAGNOSIS Lab tests or other studies may be needed to find the cause of your pain. Your health care provider may have you take a test called an ambulatory ECG (electrocardiogram). An ECG records your heartbeat patterns at the time the test is performed. You may also have other tests, such as:  Transthoracic echocardiogram (TTE). During echocardiography, sound waves are used to create a picture of all of the heart structures and to look at how blood flows through your heart.  Transesophageal echocardiogram (TEE).This is a more advanced imaging test that obtains images from inside your body. It allows your health care provider to see your heart in finer detail.  Cardiac monitoring. This allows your health care provider to monitor your heart rate and rhythm in real time.  Holter monitor. This is a portable device that records your heartbeat and can help to diagnose abnormal heartbeats. It allows your health care provider to track your heart activity for several days, if needed.  Stress tests. These can be done through exercise or by taking medicine that makes your heart beat more quickly.  Blood tests.  Imaging tests. TREATMENT  Your treatment depends on what is causing your chest pain. Treatment may include:  Medicines. These may include:  Acid blockers for heartburn.  Anti-inflammatory medicine.  Pain medicine for inflammatory conditions.  Antibiotic medicine, if an infection  is present.  Medicines to dissolve blood clots.  Medicines to treat coronary artery disease.  Supportive care for conditions that do not require medicines. This may include:  Resting.  Applying heat or cold packs to injured areas.  Limiting activities until pain decreases. HOME CARE INSTRUCTIONS  If you were  prescribed an antibiotic medicine, finish it all even if you start to feel better.  Avoid any activities that bring on chest pain.  Do not use any tobacco products, including cigarettes, chewing tobacco, or electronic cigarettes. If you need help quitting, ask your health care provider.  Do not drink alcohol.  Take medicines only as directed by your health care provider.  Keep all follow-up visits as directed by your health care provider. This is important. This includes any further testing if your chest pain does not go away.  If heartburn is the cause for your chest pain, you may be told to keep your head raised (elevated) while sleeping. This reduces the chance that acid will go from your stomach into your esophagus.  Make lifestyle changes as directed by your health care provider. These may include:  Getting regular exercise. Ask your health care provider to suggest some activities that are safe for you.  Eating a heart-healthy diet. A registered dietitian can help you to learn healthy eating options.  Maintaining a healthy weight.  Managing diabetes, if necessary.  Reducing stress. SEEK MEDICAL CARE IF:  Your chest pain does not go away after treatment.  You have a rash with blisters on your chest.  You have a fever. SEEK IMMEDIATE MEDICAL CARE IF:   Your chest pain is worse.  You have an increasing cough, or you cough up blood.  You have severe abdominal pain.  You have severe weakness.  You faint.  You have chills.  You have sudden, unexplained chest discomfort.  You have sudden, unexplained discomfort in your arms, back, neck, or jaw.  You have shortness of breath at any time.  You suddenly start to sweat, or your skin gets clammy.  You feel nauseous or you vomit.  You suddenly feel light-headed or dizzy.  Your heart begins to beat quickly, or it feels like it is skipping beats. These symptoms may represent a serious problem that is an emergency. Do  not wait to see if the symptoms will go away. Get medical help right away. Call your local emergency services (911 in the [place] Do not drive yourself to the hospital.   This information is not intended to replace advice given to you by your health care provider. Make sure you discuss any questions you have with your health care provider.   Document Released: 08/26/2005 Document Revised: 12/07/2014 Document Reviewed: 06/22/2014 Elsevier Interactive Patient Education [employer].

## 2016-02-07 NOTE — ED Provider Notes (Signed)
CSN: [id number]     Arrival date & time 02/06/16  2153 History  By signing my name below, I, Terrance Branch, attest that this documentation has been prepared under the direction and in the presence of Veryl Speak, MD. Electronically Signed: Randa Evens, ED Scribe. 02/07/2016. 2:03 AM.      Chief Complaint  Patient presents with  . Shortness of Breath   The history is provided by the patient. No language interpreter was used.   HPI Comments: Albert Allen is a 82 y.o. male PMHx of COPD, HTN, who presents to the Emergency Department complaining of intermittent chest tightness onset 2-3 days prior that has recently worsened today. He states that he feels as if he is not getting enough air when taking a deep breath. Pt reports that he has used his Proair inhaler with no relief. Pt reports some improvement after drinking some water in the ED. Denies CP    Past Medical History  Diagnosis Date  . HYPOTHYROIDISM 05/31/2007  . HYPERLIPIDEMIA 03/27/2009  . GOUT 05/31/2007  . ANEMIA, B12 DEFICIENCY 03/29/2008  . Anxiety state, unspecified 11/19/2008  . ABUSE, ALCOHOL, IN REMISSION 05/31/2007  . CARPAL TUNNEL SYNDROME, RIGHT 09/05/2007  . Alcoholic polyneuropathy ([place]) 03/29/2008  . HYPERTENSION 05/31/2007  . CAD, NATIVE VESSEL 04/04/2009  . LEFT BUNDLE BRANCH BLOCK 02/11/2009  . SINUSITIS, CHRONIC NOS 05/31/2007  . ASTHMA 05/31/2007  . GERD 05/31/2007  . Chronic prostatitis 03/29/2008  . LOC OSTEOARTHROS NOT SPEC PRIM/SEC LOWER LEG 03/20/2010  . OSTEOARTHRITIS 05/31/2007  . HIP PAIN 03/20/2010  . DEGENERATIVE DISC DISEASE, CERVICAL SPINE 06/22/2007  . Adhesive capsulitis of shoulder 12/29/2007  . SYNCOPE 11/19/2008  . WEIGHT LOSS, RECENT 11/19/2008  . DYSPHAGIA UNSPECIFIED 09/25/2008  . Hepatomegaly 09/25/2008  . PSA, INCREASED 03/20/2010  . ABNORMAL CV (STRESS) TEST 03/05/2009  . PERSONAL HX COLONIC POLYPS 03/05/1997    adenomatous   . Liver mass, left lobe   . Hiatal hernia   . Esophageal stricture   .  Sore throat     CURRENTLY   Past Surgical History  Procedure Laterality Date  . Pul. colapse w/chest tube    . Tonsillectomy    . Carpel tunnel release    . Orif ulnar fracture  11/03/2012    Procedure: OPEN REDUCTION INTERNAL FIXATION (ORIF) ULNAR FRACTURE;  Surgeon: Hessie Dibble, MD;  Location: [facility];  Service: Orthopedics;  Laterality: Right;  ORIF radius and ulnar fractures  . Eye surgery    . Inguinal hernia repair  12/18/2015  . Inguinal hernia repair Right 12/18/2015    Procedure: OPEN REPAIR RIGHT INGUINAL HERNIA ;  Surgeon: Greer Pickerel, MD;  Location: [facility];  Service: General;  Laterality: Right;  . Insertion of mesh Right 12/18/2015    Procedure: INSERTION OF MESH;  Surgeon: Greer Pickerel, MD;  Location: [facility];  Service: General;  Laterality: Right;   Family History  Problem Relation Age of Onset  . Cancer Mother     In back but unsure of what type of cancer  . Alcohol abuse Father   . Lung disease Father     abscess  . Colon cancer Neg Hx   . Diabetes Sister   . Cancer Brother     throat  . Diabetes Brother   . Diabetes Sister    Social History  Substance Use Topics  . Smoking status: Former Smoker    Types: Cigarettes    Quit date: 04/24/1998  . Smokeless tobacco: Never Used  .  Alcohol Use: No     Comment: 3 mos    Review of Systems  Respiratory: Positive for chest tightness.   All other systems reviewed and are negative.     Allergies  Amoxicillin; Penicillins; Ceclor; Celebrex; and Levaquin  Home Medications   Prior to Admission medications   Medication Sig Start Date End Date Taking? Authorizing Provider  ALPRAZolam (XANAX) 0.25 MG tablet Take 0.125 mg by mouth at bedtime as needed for sleep.     Historical Provider, MD  aspirin EC 81 MG tablet Take 81 mg by mouth daily.    Historical Provider, MD  cholecalciferol (VITAMIN D) 1000 units tablet Take 1,000 Units by mouth daily.    Historical Provider, MD  Cyanocobalamin (VITAMIN B-12 PO) Take by  mouth.    Historical Provider, MD  GuaiFENesin (MUCINEX PO) Take 1 tablet by mouth 2 (two) times daily.    Historical Provider, MD  levothyroxine (SYNTHROID, LEVOTHROID) 75 MCG tablet Take 1 tablet (75 mcg total) by mouth daily before breakfast. 12/31/15   [name], MD  naproxen sodium (ALEVE) 220 MG tablet Take 440 mg by mouth daily as needed (pain).    Historical Provider, MD  oxyCODONE (OXY IR/ROXICODONE) 5 MG immediate release tablet Take 0.5-1 tablets (2.5-5 mg total) by mouth every 4 (four) hours as needed for moderate pain. 12/19/15   Greer Pickerel, MD  pantoprazole (PROTONIX) 40 MG tablet Take 40 mg by mouth 2 (two) times daily.    Historical Provider, MD  pravastatin (PRAVACHOL) 40 MG tablet Take 1 tablet (40 mg total) by mouth daily. 01/07/16   Wardell Honour, MD  PROAIR HFA 108 [phone] Base) MCG/ACT inhaler INHALE 2 PUFFS INTO THE LUNGS EVERY 6 (SIX) HOURS AS  NEEDED FOR WHEEZING OR SHORTNESS OF BREATH. Patient taking differently: INHALE 1 PUFFS INTO THE LUNGS EVERY 6 (SIX) HOURS AS  NEEDED FOR WHEEZING OR SHORTNESS OF BREATH. 12/03/15   Eustaquio Maize, MD  tamsulosin (FLOMAX) 0.4 MG CAPS capsule TAKE 1 CAPSULE (0.4 MG TOTAL) BY MOUTH DAILY AFTER SUPPER. 10/28/15   [name], MD  Tetrahydrozoline HCl (VISINE OP) Place 1 drop into both eyes daily as needed (dry eyes).    Historical Provider, MD  valsartan (DIOVAN) 160 MG tablet TAKE ONE TABLET BY MOUTH ONE TIME DAILY 11/27/15   [name], MD   BP 157/65 mmHg  Pulse 78  Temp(Src) 97.8 F (36.6 C) (Oral)  Resp 18  SpO2 98% Physical Exam  Constitutional: He is oriented to person, place, and time. He appears well-developed and well-nourished. No distress.  HENT:  Head: Normocephalic and atraumatic.  Mouth/Throat: Oropharynx is clear and moist. No oropharyngeal exudate.  Eyes: Conjunctivae and EOM are normal. Pupils are equal, round, and reactive to light.  Neck: Normal range of motion. Neck supple.  No meningismus.   Cardiovascular: Normal rate, regular rhythm, normal heart sounds and intact distal pulses.   No murmur heard. Pulmonary/Chest: Effort normal and breath sounds normal. No respiratory distress.  Abdominal: Soft. There is no tenderness. There is no rebound and no guarding.  Musculoskeletal: Normal range of motion. He exhibits no edema or tenderness.  Neurological: He is alert and oriented to person, place, and time. No cranial nerve deficit. He exhibits normal muscle tone. Coordination normal.  No ataxia on finger to nose bilaterally. No pronator drift. 5/5 strength throughout. CN 2-12 intact.Equal grip strength. Sensation intact.   Skin: Skin is warm.  Psychiatric: He has a normal mood and affect. His behavior  is normal.  Nursing note and vitals reviewed.   ED Course  Procedures (including critical care time) DIAGNOSTIC STUDIES: Oxygen Saturation is 98% on RA, normal by my interpretation.    COORDINATION OF CARE: 2:05 AM-Discussed treatment plan with pt at bedside and pt agreed to plan.     Labs Review Labs Reviewed  BASIC METABOLIC PANEL - Abnormal; Notable for the following:    Sodium 131 (*)    Chloride 96 (*)    Glucose, Bld 159 (*)    All other components within normal limits  CBC - Abnormal; Notable for the following:    WBC 3.9 (*)    RBC 3.70 (*)    Hemoglobin 12.2 (*)    HCT 35.8 (*)    All other components within normal limits  I-STAT TROPOININ, ED    Imaging Review Dg Chest 2 View  02/06/2016  CLINICAL DATA:  Shortness of breath and chest tightness, progressive over the last few days. EXAM: CHEST  2 VIEW COMPARISON:  06/19/2014 FINDINGS: The lungs remain hyperinflated with central bronchitic markings. The cardiomediastinal contours are normal. Atherosclerosis of thoracic aorta. Pulmonary vasculature is normal. No consolidation, pleural effusion, or pneumothorax. No acute osseous abnormalities are seen. IMPRESSION: Chronic hyperinflation and central bronchitic  markings. No superimposed acute process. Electronically Signed   By: Jeb Levering M.D.   On: 02/06/2016 22:52   I have personally reviewed and evaluated these images and lab results as part of my medical decision-making.   EKG Interpretation   Date/Time:  Thursday February 06 2016 21:59:13 EST Ventricular Rate:  78 PR Interval:  262 QRS Duration: 84 QT Interval:  354 QTC Calculation: 403 R Axis:   61 Text Interpretation:  Sinus rhythm with 1st degree A-V block Otherwise  normal ECG Confirmed by Joseh Sjogren  MD, Darshana Curnutt ([id number]) on 02/07/2016 4:00:05 AM      MDM   Final diagnoses:  Feeling of chest tightness      Patient presents with complaints of chest discomfort. He reports a tightness in the front of his chest which is worse with inspiration. He reports working on a tractor today when his symptoms began. He denies any shortness of breath, nausea, or diaphoresis. He denies any exertional symptoms. His workup reveals an unchanged EKG and negative troponin. His symptoms are reproduced with breathing and palpation. I highly doubt a cardiac etiology. I have discussed the disposition with the patient and his son. He was offered admission, however the patient does not want to stay. He understands that there are risks both with admission and discharge and is willing to accept them. He has a cardiologist, Dr. Julianne Handler with whom he is to follow-up in the near future. He understands to return if symptoms worsen or change.   I personally performed the services described in this documentation, which was scribed in my presence. The recorded information has been reviewed and is accurate.       Veryl Speak, MD 02/07/16 [phone]

## 2016-02-07 NOTE — ED Notes (Signed)
Patient states he had tightness in his chest that was painful earlier  Denies that painful tightness at this time.

## 2016-02-07 NOTE — ED Notes (Signed)
Discharge instructions reviewed and reminded to call MD in the AM  Voiced understanding

## 2016-02-07 NOTE — Telephone Encounter (Signed)
Informed patient that Dr. [name] and his nurse are not in the office today.  Explained that I would forward to them to address when he should be seen (if want him seen before scheduled appt on 5/5). Informed patient that [facility] schedule is full until end of June - so he may be advised to f/u w/ APP. Patient verbalized understanding and agreeable to plan. He understands it will be next week before calling him back w/ recommendation/s.

## 2016-02-10 NOTE — Telephone Encounter (Signed)
Spoke with pt and appointment made for him to see Richardson Dopp, PA on 02/13/16 at 8:30

## 2016-02-10 NOTE — Telephone Encounter (Signed)
Can we get him in to see an APP this week? Albert Allen

## 2016-02-11 ENCOUNTER — Ambulatory Visit: Payer: Medicare Other | Admitting: Family Medicine

## 2016-02-12 NOTE — Progress Notes (Signed)
Cardiology Office Note:    Date:  02/13/2016   ID:  Albert Allen, DOB 12-21-1922, MRN [id number]  PCP:  [name], MD  Cardiologist:  [name]   Electrophysiologist:  n/a  Chief Complaint  Patient presents with  . Hospitalization Follow-up    ED with Chest Pain 02/07/16    History of Present Illness:     Albert Allen is a 82 y.o. male with a hx of CAD, HTN, anemia, asthma/COPD, GERD, osteoarthritis, gout, hypothyroidism, hiatal hernia, alcohol abuse. Initially seen in 4/10 for the evaluation of chest pain. He was noted to have LBBB and an abnormal Myoview. LHC in 4/10 demonstrated 70-80% heavily calcified mid LAD lesion with severe disease throughout the mid section with heavy calcification. This vessel was not optimal for PCI. Medical management was arranged. Echo in 2/14 with normal LV size, mild AI, mild MR. Carotid US in 6/14 with mild bilateral disease. Last seen by Dr. [name] 9/16.   Patient seen in the ED 02/07/16 with chest pain. Cardiac markers were negative. Chest x-ray is unremarkable. ECG was unremarkable. He returns for FU.  The patient notes a long history of chest tightness and chronic dyspnea. This is related to his COPD. It usually improves with using his inhaler. He also has a lot of stomach issues. He is currently on Protonix once a day. He developed increasing tightness in his chest after getting off of his tractor and doing a lot of exertional activity. This lasted about 2 hours and prompted his visit to the emergency room. He denies any associated symptoms. His inhaler did help some. He feels like he has noted some more tightness in his chest than usual since this episode. He has a lot of nausea. Denies vomiting or diarrhea. He denies syncope, orthopnea, PND or edema.   Past Medical History  Diagnosis Date  . HYPOTHYROIDISM 05/31/2007  . HYPERLIPIDEMIA 03/27/2009  . GOUT 05/31/2007  . ANEMIA, B12 DEFICIENCY 03/29/2008  . Anxiety state,  unspecified 11/19/2008  . ABUSE, ALCOHOL, IN REMISSION 05/31/2007  . CARPAL TUNNEL SYNDROME, RIGHT 09/05/2007  . Alcoholic polyneuropathy ([place]) 03/29/2008  . HYPERTENSION 05/31/2007  . CAD, NATIVE VESSEL 04/04/2009  . LEFT BUNDLE BRANCH BLOCK 02/11/2009  . SINUSITIS, CHRONIC NOS 05/31/2007  . ASTHMA 05/31/2007  . GERD 05/31/2007  . Chronic prostatitis 03/29/2008  . LOC OSTEOARTHROS NOT SPEC PRIM/SEC LOWER LEG 03/20/2010  . OSTEOARTHRITIS 05/31/2007  . HIP PAIN 03/20/2010  . DEGENERATIVE DISC DISEASE, CERVICAL SPINE 06/22/2007  . Adhesive capsulitis of shoulder 12/29/2007  . SYNCOPE 11/19/2008  . WEIGHT LOSS, RECENT 11/19/2008  . DYSPHAGIA UNSPECIFIED 09/25/2008  . Hepatomegaly 09/25/2008  . PSA, INCREASED 03/20/2010  . ABNORMAL CV (STRESS) TEST 03/05/2009  . PERSONAL HX COLONIC POLYPS 03/05/1997    adenomatous   . Liver mass, left lobe   . Hiatal hernia   . Esophageal stricture   . Sore throat     CURRENTLY    Past Surgical History  Procedure Laterality Date  . Pul. colapse w/chest tube    . Tonsillectomy    . Carpel tunnel release    . Orif ulnar fracture  11/03/2012    Procedure: OPEN REDUCTION INTERNAL FIXATION (ORIF) ULNAR FRACTURE;  Surgeon: Hessie Dibble, MD;  Location: [facility];  Service: Orthopedics;  Laterality: Right;  ORIF radius and ulnar fractures  . Eye surgery    . Inguinal hernia repair  12/18/2015  . Inguinal hernia repair Right 12/18/2015    Procedure: OPEN REPAIR RIGHT  INGUINAL HERNIA ;  Surgeon: Greer Pickerel, MD;  Location: [facility];  Service: General;  Laterality: Right;  . Insertion of mesh Right 12/18/2015    Procedure: INSERTION OF MESH;  Surgeon: Greer Pickerel, MD;  Location: [facility];  Service: General;  Laterality: Right;    Current Medications: Outpatient Prescriptions Prior to Visit  Medication Sig Dispense Refill  . ALPRAZolam (XANAX) 0.25 MG tablet Take 0.125 mg by mouth at bedtime as needed for sleep.     Marland Kitchen aspirin EC 81 MG tablet Take 81 mg by mouth daily.    .  cholecalciferol (VITAMIN D) 1000 units tablet Take 1,000 Units by mouth daily.    . Cyanocobalamin (VITAMIN B-12 PO) Take by mouth.    . GuaiFENesin (MUCINEX PO) Take 1 tablet by mouth 2 (two) times daily.    Marland Kitchen levothyroxine (SYNTHROID, LEVOTHROID) 75 MCG tablet Take 1 tablet (75 mcg total) by mouth daily before breakfast. 90 tablet 0  . oxyCODONE (OXY IR/ROXICODONE) 5 MG immediate release tablet Take 0.5-1 tablets (2.5-5 mg total) by mouth every 4 (four) hours as needed for moderate pain. 30 tablet 0  . pantoprazole (PROTONIX) 40 MG tablet Take 40 mg by mouth 2 (two) times daily.    . pravastatin (PRAVACHOL) 40 MG tablet Take 1 tablet (40 mg total) by mouth daily. 90 tablet 0  . Tetrahydrozoline HCl (VISINE OP) Place 1 drop into both eyes daily as needed (dry eyes).    . valsartan (DIOVAN) 160 MG tablet TAKE ONE TABLET BY MOUTH ONE TIME DAILY 90 tablet 0  . naproxen sodium (ALEVE) 220 MG tablet Take 440 mg by mouth daily as needed (pain).    Marland Kitchen PROAIR HFA 108 (90 Base) MCG/ACT inhaler INHALE 2 PUFFS INTO THE LUNGS EVERY 6 (SIX) HOURS AS  NEEDED FOR WHEEZING OR SHORTNESS OF BREATH. (Patient taking differently: INHALE 1 PUFFS INTO THE LUNGS EVERY 6 (SIX) HOURS AS  NEEDED FOR WHEEZING OR SHORTNESS OF BREATH.) 9 g 2  . tamsulosin (FLOMAX) 0.4 MG CAPS capsule TAKE 1 CAPSULE (0.4 MG TOTAL) BY MOUTH DAILY AFTER SUPPER. 30 capsule 2   No facility-administered medications prior to visit.     Allergies:   Amoxicillin; Penicillins; Ceclor; Celebrex; and Levaquin   Social History   Social History  . Marital Status: Married    Spouse Name: N/A  . Number of Children: N/A  . Years of Education: N/A   Occupational History  . retired     Oncologist   Social History Main Topics  . Smoking status: Former Smoker    Types: Cigarettes    Quit date: 04/24/1998  . Smokeless tobacco: Never Used  . Alcohol Use: No     Comment: 3 mos  . Drug Use: No  . Sexual Activity: Not Currently   Other Topics  Concern  . None   Social History Narrative     Family History:  The patient's family history includes Alcohol abuse in his father; Cancer in his brother and mother; Diabetes in his brother, sister, and sister; Lung disease in his father. There is no history of Colon cancer.   ROS:   Please see the history of present illness.    Review of Systems  Constitution: Positive for decreased appetite and malaise/fatigue.  HENT: Positive for hearing loss.   Gastrointestinal: Positive for abdominal pain.  Genitourinary: Positive for incomplete emptying.  Neurological: Positive for dizziness.  Psychiatric/Behavioral: The patient is nervous/anxious.   All other systems reviewed and are negative.  Physical Exam:    VS:  BP 140/60 mmHg  Pulse 84  Ht 5\' 7"  (1.702 m)  Wt 135 lb 6.4 oz (61.417 kg)  BMI 21.20 kg/m2   GEN: Well nourished, well developed, in no acute distress HEENT: normal Neck: no JVD, no masses Cardiac: Normal S1/S2, RRR; no murmurs, rubs, or gallops, no edema   Respiratory:  clear to auscultation bilaterally; no wheezing, rhonchi or rales GI: soft, nontender, nondistended MS: no deformity or atrophy Skin: warm and dry Neuro: No focal deficits  Psych: Alert and oriented x 3, normal affect  Wt Readings from Last 3 Encounters:  02/13/16 135 lb 6.4 oz (61.417 kg)  12/18/15 137 lb (62.143 kg)  11/29/15 136 lb 3.2 oz (61.78 kg)      Studies/Labs Reviewed:     EKG:  EKG is not ordered today.  The ekg ordered today demonstrates n/a  Recent Labs: 07/29/2015: ALT 9; TSH 1.250 02/06/2016: BUN 15; Creatinine, Ser 1.04; Hemoglobin 12.2*; Platelets 181; Potassium 4.5; Sodium 131*   Recent Lipid Panel    Component Value Date/Time   CHOL 164 07/29/2015 1357   CHOL 177 05/01/2013 1059   CHOL 164 09/22/2012 1051   TRIG 452* 07/29/2015 1357   TRIG 196* 05/01/2013 1059   HDL 41 07/29/2015 1357   HDL 45 05/01/2013 1059   HDL 41.90 09/22/2012 1051   CHOLHDL 4.0 07/29/2015 1357     CHOLHDL 4 09/22/2012 1051   VLDL 35.2 09/22/2012 1051   [place] Comment 07/29/2015 1357   LDLCALC 93 05/01/2013 1059   LDLCALC 87 09/22/2012 1051   LDLDIRECT 67.7 01/12/2012 0823    Additional studies/ records that were reviewed today include:    Carotid US 6/14 Bilat ICA 1-39%  Echo 5/14 Mild LVH, EF 55-65%, normal wall motion, grade 1 diastolic dysfunction, mild AI, MAC, mild MR  LHC 4/10 LM okay LAD proximal heavily calcified 50%, heavily calcified 70-80%, mid heavily calcified 50-60%, ostial D1 50% LCx OM3 ostial 20% RCA proximal serial 30% EF 60% LAD not amenable to PCI >> medical therapy   ASSESSMENT:     1. Other chest pain   2. Coronary artery disease involving native coronary artery of native heart without angina pectoris   3. Essential hypertension   4. Hyperlipidemia   5. Chronic obstructive pulmonary disease, unspecified COPD type ([place])     PLAN:     In order of problems listed above:  1. Chest pain - Chest pain somewhat atypical. His symptoms may be related to anxiety versus acid reflux versus angina. He does have a history of a heavily calcified moderate-severe LAD lesion. This could not be treated with PCI. He has been taken off of metoprolol the past secondary to dizziness. I think his blood pressure could tolerate the addition of long-acting nitrates. He does not take PDE-5 inhibitors.  -  Increase Protonix to 40 mg twice a day 2 weeks  -  Add isosorbide mononitrate 15 mg daily  -  Obtain echocardiogram  -  Early follow-up with GI if nausea continues  -  Schedule follow-up with PCP to address his issues of anxiety  -  Keep follow-up in May with Dr. [name]. Return sooner if symptoms progress.  2. CAD - As noted, long-acting nitrates will be added to see if this helps his chest discomfort. I will obtain echocardiogram. Nuclear stress test would not likely be helpful given his known anatomy. I will review further with Dr. [name]. Continue aspirin,  statin.  3.  HTN - Controlled.  4. HL - Continue statin.  5. COPD - Continue current therapy. He denies increased coughing or sputum production. Recent chest x-ray normal.   Medication Adjustments/Labs and Tests Ordered: Current medicines are reviewed at length with the patient today.  Concerns regarding medicines are outlined above.  Medication changes, Labs and Tests ordered today are outlined in the Patient Instructions noted below. Patient Instructions  Medication Instructions:  1. START IMDUR 30 MG TABLET WITH THE DIRECTIONS: TAKE 1/2 TABLET DAILY = 15 MG DAILY 2. INCREASE PROTONIX TO 40 MG TWICE DAILY FOR 2 WEEKS THEN DECREASE TO 40 MG DAILY  Labwork: NONE  Testing/Procedures: Your physician has requested that you have an echocardiogram. Echocardiography is a painless test that uses sound waves to create images of your heart. It provides your doctor with information about the size and shape of your heart and how well your heart's chambers and valves are working. This procedure takes approximately one hour. There are no restrictions for this procedure.  Follow-Up: KEEP YOUR APPT WITH DR. [name] 03/2016; CALL [phone] IS YOUR SYMPTOMS CHANGE OR WORSEN TO BE SEEN SOONER  Any Other Special Instructions Will Be Listed Below (If Applicable).  If you need a refill on your cardiac medications before your next appointment, please call your pharmacy.   Signed, Richardson Dopp, PA-C  02/13/2016 4:56 PM    [facility] [address], [place], [place]  [address] Phone: [phone]; Fax: [phone]

## 2016-02-13 ENCOUNTER — Encounter: Payer: Self-pay | Admitting: Physician Assistant

## 2016-02-13 ENCOUNTER — Ambulatory Visit (INDEPENDENT_AMBULATORY_CARE_PROVIDER_SITE_OTHER): Payer: Medicare Other | Admitting: Physician Assistant

## 2016-02-13 VITALS — BP 140/60 | HR 84 | Ht 67.0 in | Wt 135.4 lb

## 2016-02-13 DIAGNOSIS — E785 Hyperlipidemia, unspecified: Secondary | ICD-10-CM

## 2016-02-13 DIAGNOSIS — R0789 Other chest pain: Secondary | ICD-10-CM

## 2016-02-13 DIAGNOSIS — I2510 Atherosclerotic heart disease of native coronary artery without angina pectoris: Secondary | ICD-10-CM

## 2016-02-13 DIAGNOSIS — I10 Essential (primary) hypertension: Secondary | ICD-10-CM | POA: Diagnosis not present

## 2016-02-13 DIAGNOSIS — J449 Chronic obstructive pulmonary disease, unspecified: Secondary | ICD-10-CM

## 2016-02-13 MED ORDER — ISOSORBIDE MONONITRATE ER 30 MG PO TB24: 15.0000 mg | ORAL_TABLET | Freq: Every day | ORAL | Status: DC

## 2016-02-13 NOTE — Patient Instructions (Addendum)
Medication Instructions:  1. START IMDUR 30 MG TABLET WITH THE DIRECTIONS: TAKE 1/2 TABLET DAILY = 15 MG DAILY 2. INCREASE PROTONIX TO 40 MG TWICE DAILY FOR 2 WEEKS THEN DECREASE TO 40 MG DAILY  Labwork: NONE  Testing/Procedures: Your physician has requested that you have an echocardiogram. Echocardiography is a painless test that uses sound waves to create images of your heart. It provides your doctor with information about the size and shape of your heart and how well your heart's chambers and valves are working. This procedure takes approximately one hour. There are no restrictions for this procedure.  Follow-Up: KEEP YOUR APPT WITH DR. [name] 03/2016; CALL [phone] IS YOUR SYMPTOMS CHANGE OR WORSEN TO BE SEEN SOONER  Any Other Special Instructions Will Be Listed Below (If Applicable).  If you need a refill on your cardiac medications before your next appointment, please call your pharmacy.

## 2016-02-18 ENCOUNTER — Other Ambulatory Visit: Payer: Self-pay | Admitting: Family Medicine

## 2016-02-19 NOTE — Progress Notes (Signed)
Reviewed with [name]. He agreed that a nuclear stress test would not yield useful information. Continue current treatment plan. Richardson Dopp, PA-C   02/19/2016 2:15 PM

## 2016-03-02 ENCOUNTER — Other Ambulatory Visit: Payer: Self-pay | Admitting: Family Medicine

## 2016-03-02 NOTE — Telephone Encounter (Signed)
ihave never seen this patient- you have seen him in the past

## 2016-03-02 NOTE — Telephone Encounter (Signed)
Last seen 11/29/15  Dr Evette Doffing   If approved route to nurse to call into [facility]

## 2016-03-04 ENCOUNTER — Other Ambulatory Visit: Payer: Self-pay

## 2016-03-04 ENCOUNTER — Other Ambulatory Visit (HOSPITAL_COMMUNITY): Payer: Medicare Other

## 2016-03-04 DIAGNOSIS — J206 Acute bronchitis due to rhinovirus: Secondary | ICD-10-CM

## 2016-03-04 MED ORDER — LEVOTHYROXINE SODIUM 75 MCG PO TABS: 75.0000 ug | ORAL_TABLET | Freq: Every day | ORAL | Status: DC

## 2016-03-12 ENCOUNTER — Telehealth: Payer: Self-pay | Admitting: Physician Assistant

## 2016-03-12 NOTE — Telephone Encounter (Signed)
Pt c/o of Chest Pain: STAT if CP now or developed within 24 hours  PT is requesting Richardson Dopp know about his issues, he said scott told him to tell him when he feel any changes  1. Are you having CP right now? NO  2. Are you experiencing any other symptoms (ex. SOB, nausea, vomiting, sweating)? SOB 3. How long have you been experiencing CP? WEEKS 4. Is your CP continuous or coming and going? coming and going  5. Have you taken Nitroglycerin? NO ?

## 2016-03-12 NOTE — Telephone Encounter (Signed)
I returned pt's call and lmtcb. Pt was calling in per [employer]. PA recommendation from last ov to call us and let us know he has been feeling .

## 2016-03-13 NOTE — Telephone Encounter (Signed)
Follow up   Pt called and expressed that he and his wife dont hear very well please speak louder when leaving a vm   i called spoke to Harvey who took message to have Arbie Cookey call pt back

## 2016-03-13 NOTE — Telephone Encounter (Signed)
I s/w pt today who states when he gets up in the night to go to the bathroom he feels his flutter for a moment or so; he does state the flutter feeling does not linger though. Pt ask to be seen sooner. I scheduled pt w/PA 4/21 9:30 same day Dr. [name] is here in the office. Pt also had his echo appt moved as well. I did not cancel Dr. [name] appt 04/03/16, will wait to see what PA says for f/u needed at 4/21 appt.

## 2016-03-18 ENCOUNTER — Other Ambulatory Visit: Payer: Self-pay

## 2016-03-18 ENCOUNTER — Ambulatory Visit (HOSPITAL_COMMUNITY): Payer: Medicare Other | Attending: Cardiovascular Disease

## 2016-03-18 DIAGNOSIS — I351 Nonrheumatic aortic (valve) insufficiency: Secondary | ICD-10-CM | POA: Insufficient documentation

## 2016-03-18 DIAGNOSIS — R0789 Other chest pain: Secondary | ICD-10-CM | POA: Insufficient documentation

## 2016-03-18 DIAGNOSIS — R079 Chest pain, unspecified: Secondary | ICD-10-CM | POA: Diagnosis present

## 2016-03-18 DIAGNOSIS — I2510 Atherosclerotic heart disease of native coronary artery without angina pectoris: Secondary | ICD-10-CM | POA: Insufficient documentation

## 2016-03-18 DIAGNOSIS — I517 Cardiomegaly: Secondary | ICD-10-CM | POA: Insufficient documentation

## 2016-03-18 DIAGNOSIS — I340 Nonrheumatic mitral (valve) insufficiency: Secondary | ICD-10-CM | POA: Insufficient documentation

## 2016-03-19 ENCOUNTER — Encounter: Payer: Self-pay | Admitting: Physician Assistant

## 2016-03-19 NOTE — Progress Notes (Signed)
Cardiology Office Note:    Date:  03/20/2016   ID:  Albert Allen, DOB 1923/06/08, MRN [id number]  PCP:  [name], MD  Cardiologist:  [name]   Electrophysiologist:  n/a  Chief Complaint  Patient presents with  . Palpitations    History of Present Illness:     Albert Allen is a 82 y.o. male with a hx of CAD, HTN, anemia, asthma/COPD, GERD, osteoarthritis, gout, hypothyroidism, hiatal hernia, alcohol abuse. Initially seen in 4/10 for the evaluation of chest pain. He was noted to have LBBB and an abnormal Myoview. LHC in 4/10 demonstrated 70-80% heavily calcified mid LAD lesion with severe disease throughout the mid section with heavy calcification. This vessel was not optimal for PCI. Medical management was arranged. Echo in 2/14 with normal LV size, mild AI, mild MR. Carotid US in 6/14 with mild bilateral disease.   I saw him 02/13/16 after a visit to the emergency room chest pain.  Cardiac markers were negative. Chest x-ray was unremarkable. ECG was unremarkable. He reported a long history of chest tightness and dyspnea related to COPD that would typically improve with his inhaler. Given his history of a heavily calcified moderate to severe LAD lesion, it was felt that nuclear stress testing would not be helpful. The lesion noted previously could not be treated with PCI. I elected to treat him medically. He was placed on nitrates. I also increased his PPI. Echocardiogram was obtained. This demonstrated normal LV function. He's due for follow-up with Dr. [name] soon but called in recently with palpitations.  He returns for evaluation.  Here alone.  He really is not having any palpitations.  He continues to have chest tightness at times.  He denies exertional symptoms.  He uses his inhaler with improved symptoms.  Denies significant dyspnea.  Denies syncope.  Denies orthopnea, PND, edema.  He has a lot of urinary issues with assoc nausea.  Denies assoc symptoms  with his chest pain.  He admits to a lot of issues with anxiety. He takes care of his wife suffering from progressive dementia. He also has a lot of issues with acid reflux.   Past Medical History  Diagnosis Date  . HYPOTHYROIDISM 05/31/2007  . HYPERLIPIDEMIA 03/27/2009  . GOUT 05/31/2007  . ANEMIA, B12 DEFICIENCY 03/29/2008  . Anxiety state, unspecified 11/19/2008  . ABUSE, ALCOHOL, IN REMISSION 05/31/2007  . CARPAL TUNNEL SYNDROME, RIGHT 09/05/2007  . Alcoholic polyneuropathy ([place]) 03/29/2008  . HYPERTENSION 05/31/2007  . CAD, NATIVE VESSEL 04/04/2009  . LEFT BUNDLE BRANCH BLOCK 02/11/2009  . SINUSITIS, CHRONIC NOS 05/31/2007  . ASTHMA 05/31/2007  . GERD 05/31/2007  . Chronic prostatitis 03/29/2008  . LOC OSTEOARTHROS NOT SPEC PRIM/SEC LOWER LEG 03/20/2010  . OSTEOARTHRITIS 05/31/2007  . HIP PAIN 03/20/2010  . DEGENERATIVE DISC DISEASE, CERVICAL SPINE 06/22/2007  . Adhesive capsulitis of shoulder 12/29/2007  . SYNCOPE 11/19/2008  . WEIGHT LOSS, RECENT 11/19/2008  . DYSPHAGIA UNSPECIFIED 09/25/2008  . Hepatomegaly 09/25/2008  . PSA, INCREASED 03/20/2010  . ABNORMAL CV (STRESS) TEST 03/05/2009  . PERSONAL HX COLONIC POLYPS 03/05/1997    adenomatous   . Liver mass, left lobe   . Hiatal hernia   . Esophageal stricture   . Sore throat     CURRENTLY  . History of echocardiogram     Echo 4/17 - mild LVH, EF 50-55%, Gr 1 DD, trivial AI, MAC, mild MR    Past Surgical History  Procedure Laterality Date  . Pul. colapse w/chest tube    .  Tonsillectomy    . Carpel tunnel release    . Orif ulnar fracture  11/03/2012    Procedure: OPEN REDUCTION INTERNAL FIXATION (ORIF) ULNAR FRACTURE;  Surgeon: Hessie Dibble, MD;  Location: [facility];  Service: Orthopedics;  Laterality: Right;  ORIF radius and ulnar fractures  . Eye surgery    . Inguinal hernia repair  12/18/2015  . Inguinal hernia repair Right 12/18/2015    Procedure: OPEN REPAIR RIGHT INGUINAL HERNIA ;  Surgeon: Greer Pickerel, MD;  Location: [facility];  Service:  General;  Laterality: Right;  . Insertion of mesh Right 12/18/2015    Procedure: INSERTION OF MESH;  Surgeon: Greer Pickerel, MD;  Location: [facility];  Service: General;  Laterality: Right;    Current Medications: Outpatient Prescriptions Prior to Visit  Medication Sig Dispense Refill  . ALPRAZolam (XANAX) 0.25 MG tablet TAKE ONE TABLET AT BEDTIME 30 tablet 0  . aspirin EC 81 MG tablet Take 81 mg by mouth daily.    Marland Kitchen levothyroxine (SYNTHROID, LEVOTHROID) 75 MCG tablet Take 1 tablet (75 mcg total) by mouth daily before breakfast. 90 tablet 0  . pantoprazole (PROTONIX) 40 MG tablet TAKE ONE TABLET DAILY 30 tablet 5  . pravastatin (PRAVACHOL) 40 MG tablet Take 1 tablet (40 mg total) by mouth daily. 90 tablet 0  . Tetrahydrozoline HCl (VISINE OP) Place 1 drop into both eyes daily as needed (dry eyes).    . isosorbide mononitrate (IMDUR) 30 MG 24 hr tablet Take 0.5 tablets (15 mg total) by mouth daily. 30 tablet 11  . albuterol (PROVENTIL HFA;VENTOLIN HFA) 108 (90 Base) MCG/ACT inhaler Inhale into the lungs every 6 (six) hours as needed for wheezing or shortness of breath.    . cholecalciferol (VITAMIN D) 1000 units tablet Take 1,000 Units by mouth daily.    . Cyanocobalamin (VITAMIN B-12 PO) Take by mouth.    . GuaiFENesin (MUCINEX PO) Take 1 tablet by mouth 2 (two) times daily.    Marland Kitchen oxyCODONE (OXY IR/ROXICODONE) 5 MG immediate release tablet Take 0.5-1 tablets (2.5-5 mg total) by mouth every 4 (four) hours as needed for moderate pain. 30 tablet 0  . valsartan (DIOVAN) 160 MG tablet TAKE ONE TABLET BY MOUTH ONE TIME DAILY 90 tablet 0   No facility-administered medications prior to visit.     Allergies:   Amoxicillin; Penicillins; Ceclor; Celebrex; and Levaquin   Social History   Social History  . Marital Status: Married    Spouse Name: N/A  . Number of Children: N/A  . Years of Education: N/A   Occupational History  . retired     Oncologist   Social History Main Topics  . Smoking  status: Former Smoker    Types: Cigarettes    Quit date: 04/24/1998  . Smokeless tobacco: Never Used  . Alcohol Use: No     Comment: 3 mos  . Drug Use: No  . Sexual Activity: Not Currently   Other Topics Concern  . None   Social History Narrative     Family History:  The patient's family history includes Alcohol abuse in his father; Cancer in his brother and mother; Diabetes in his brother, sister, and sister; Lung disease in his father. There is no history of Colon cancer.   ROS:   Please see the history of present illness.    Review of Systems  Constitution: Positive for malaise/fatigue.  Cardiovascular: Positive for chest pain and irregular heartbeat.  Respiratory: Positive for wheezing.   Musculoskeletal: Positive  for back pain.  Gastrointestinal: Positive for nausea.  Genitourinary: Positive for incomplete emptying.  Neurological: Positive for dizziness and loss of balance.  All other systems reviewed and are negative.   Physical Exam:    VS:  BP 160/62 mmHg  Pulse 78  Ht 5\' 7"  (1.702 m)  Wt 134 lb 1.9 oz (60.836 kg)  BMI 21.00 kg/m2   GEN: Well nourished, well developed, in no acute distress HEENT: normal Neck: no JVD, no masses Cardiac: Normal S1/S2, RRR; no murmurs, rubs, or gallops, no edema   Respiratory:  clear to auscultation bilaterally; no wheezing, rhonchi or rales GI: soft, nontender, nondistended MS: no deformity or atrophy Skin: warm and dry Neuro: No focal deficits  Psych: Alert and oriented x 3, normal affect  Wt Readings from Last 3 Encounters:  03/20/16 134 lb 1.9 oz (60.836 kg)  02/13/16 135 lb 6.4 oz (61.417 kg)  12/18/15 137 lb (62.143 kg)      Studies/Labs Reviewed:     EKG:  EKG is not ordered today.  The ekg ordered today demonstrates n/a  Recent Labs: 07/29/2015: ALT 9; TSH 1.250 02/06/2016: BUN 15; Creatinine, Ser 1.04; Hemoglobin 12.2*; Platelets 181; Potassium 4.5; Sodium 131*   Recent Lipid Panel    Component Value  Date/Time   CHOL 164 07/29/2015 1357   CHOL 177 05/01/2013 1059   CHOL 164 09/22/2012 1051   TRIG 452* 07/29/2015 1357   TRIG 196* 05/01/2013 1059   HDL 41 07/29/2015 1357   HDL 45 05/01/2013 1059   HDL 41.90 09/22/2012 1051   CHOLHDL 4.0 07/29/2015 1357   CHOLHDL 4 09/22/2012 1051   VLDL 35.2 09/22/2012 1051   [place] Comment 07/29/2015 1357   LDLCALC 93 05/01/2013 1059   LDLCALC 87 09/22/2012 1051   LDLDIRECT 67.7 01/12/2012 0823    Additional studies/ records that were reviewed today include:   Echo 4/17 -  mild LVH, EF 50-55%, Gr 1 DD, trivial AI, MAC, mild MR  Carotid US 6/14 Bilat ICA 1-39%  Echo 5/14 Mild LVH, EF 55-65%, normal wall motion, grade 1 diastolic dysfunction, mild AI, MAC, mild MR  LHC 4/10 LM okay LAD proximal heavily calcified 50%, heavily calcified 70-80%, mid heavily calcified 50-60%, ostial D1 50% LCx OM3 ostial 20% RCA proximal serial 30% EF 60% LAD not amenable to PCI >> medical therapy   ASSESSMENT:     1. Other chest pain   2. Coronary artery disease involving native coronary artery of native heart without angina pectoris   3. Essential hypertension   4. Hyperlipidemia     PLAN:     In order of problems listed above:  1. Chest pain - Chest pain somewhat atypical.  I suspect his symptoms are secondary to COPD, acid reflux and anxiety.  He takes care of his wife who has significant dementia.  Recent echocardiogram without significant abnormalities. I have recommended he follow-up with his primary care doctor for treatment of COPD, acid reflux and anxiety.  2. CAD - Patient also seen by Dr. [name] today.  Symptoms overall stable. No exertional symptoms to suggest worsening CAD. Continue medical therapy. Continue aspirin, statin, nitrates.  3. HTN - Uncontrolled. I will adjust isosorbide to 30 mg daily.  Consider adding Amlodipine if BP remains high.   4. HL - Continue statin.    Medication Adjustments/Labs and Tests  Ordered: Current medicines are reviewed at length with the patient today.  Concerns regarding medicines are outlined above.  Medication changes, Labs and Tests  ordered today are outlined in the Patient Instructions noted below. Patient Instructions  Medication Instructions:  1. INCREASE IMDUR 30 MG DAILY; NEW RX SENT IN Labwork: NONE Testing/Procedures: NONE Follow-Up: DR. [name] 4 MONTHS WE WILL SEND OUT A REMINDER LETTER IN ABOUT 2 MONTHS TO MAKE APPT.  Any Other Special Instructions Will Be Listed Below (If Applicable). MAKE SUR E TO FOLLOW UP WITH PRIMARY CARE ABOUT GERD, COPD, AND ANXIETY If you need a refill on your cardiac medications before your next appointment, please call your pharmacy.   Signed, Richardson Dopp, PA-C  03/20/2016 1:24 PM    [facility] [address], [place], [place]  [address] Phone: [phone]; Fax: [phone]

## 2016-03-20 ENCOUNTER — Telehealth: Payer: Self-pay | Admitting: *Deleted

## 2016-03-20 ENCOUNTER — Encounter: Payer: Self-pay | Admitting: Physician Assistant

## 2016-03-20 ENCOUNTER — Ambulatory Visit (INDEPENDENT_AMBULATORY_CARE_PROVIDER_SITE_OTHER): Payer: Medicare Other | Admitting: Physician Assistant

## 2016-03-20 VITALS — BP 160/62 | HR 78 | Ht 67.0 in | Wt 134.1 lb

## 2016-03-20 DIAGNOSIS — R0789 Other chest pain: Secondary | ICD-10-CM

## 2016-03-20 DIAGNOSIS — I10 Essential (primary) hypertension: Secondary | ICD-10-CM | POA: Diagnosis not present

## 2016-03-20 DIAGNOSIS — E785 Hyperlipidemia, unspecified: Secondary | ICD-10-CM | POA: Diagnosis not present

## 2016-03-20 DIAGNOSIS — I2510 Atherosclerotic heart disease of native coronary artery without angina pectoris: Secondary | ICD-10-CM | POA: Diagnosis not present

## 2016-03-20 MED ORDER — ISOSORBIDE MONONITRATE ER 30 MG PO TB24: 30.0000 mg | ORAL_TABLET | Freq: Every day | ORAL | Status: DC

## 2016-03-20 NOTE — Patient Instructions (Addendum)
Medication Instructions:  1. INCREASE IMDUR 30 MG DAILY; NEW RX SENT IN Labwork: NONE Testing/Procedures: NONE Follow-Up: DR. [name] 4 MONTHS WE WILL SEND OUT A REMINDER LETTER IN ABOUT 2 MONTHS TO MAKE APPT.  Any Other Special Instructions Will Be Listed Below (If Applicable). MAKE SUR E TO FOLLOW UP WITH PRIMARY CARE ABOUT GERD, COPD, AND ANXIETY If you need a refill on your cardiac medications before your next appointment, please call your pharmacy.

## 2016-03-20 NOTE — Telephone Encounter (Signed)
Lmtcb to go over echo results.  

## 2016-03-26 ENCOUNTER — Other Ambulatory Visit: Payer: Self-pay | Admitting: *Deleted

## 2016-03-31 ENCOUNTER — Other Ambulatory Visit: Payer: Self-pay | Admitting: Nurse Practitioner

## 2016-03-31 ENCOUNTER — Telehealth: Payer: Self-pay | Admitting: Cardiovascular Disease

## 2016-03-31 DIAGNOSIS — R0789 Other chest pain: Secondary | ICD-10-CM

## 2016-03-31 NOTE — Telephone Encounter (Signed)
New message:   Please call,concerning his Valsartan and Isosorbide.

## 2016-03-31 NOTE — Telephone Encounter (Signed)
I called and spoke with Tressie Ellis at the pharmacy. I advised we are waiting on clarification from the PA to what dose of medications he should be on for imdur/ diovan. We will call back once a decision is made. Tressie Ellis is appreciative for the call back.

## 2016-03-31 NOTE — Telephone Encounter (Signed)
Please review and advise.

## 2016-03-31 NOTE — Telephone Encounter (Signed)
Ok to take Imdur 15 mg QD Last time seen his Valsartan was listed as 160 mg QD I would stay on this dose and monitor BP over 2 weeks.  Then send readings to me and I can review. Richardson Dopp, PA-C   03/31/2016 5:26 PM

## 2016-03-31 NOTE — Telephone Encounter (Signed)
Albert Allen is calling in to see if the pt is suppose to still be taking a BP and if so what is the strength and dosage. Please f/u with him .

## 2016-03-31 NOTE — Telephone Encounter (Signed)
Spoke with pt, his isosorbide was increased to 30 mg at last ov. After the increased he noticed blurred vision and his head did not feel right. He has decreased the isosorbide back to 15 mg daily and the vision has cleared up. He also is confused regarding the valsartan, his bottle is 320 mg once daily but someone told him he was not supposed to be taking it at all. According to last ov, the pt should be taking 160 mg of valsartan. Pt reports bp of 120/67 when last checked. He needs to know if okay to continue 15 mg of isosorbide and 160 or 320 mg of valsartan. Will forward to [facility]ott [facility] pa for review

## 2016-03-31 NOTE — Telephone Encounter (Signed)
Last seen 11/29/15  Dr Evette Doffing  Dr Livia Snellen PCP   If approved route to nurse to call into [facility]

## 2016-04-01 ENCOUNTER — Telehealth: Payer: Self-pay | Admitting: Cardiovascular Disease

## 2016-04-01 MED ORDER — VALSARTAN 160 MG PO TABS: 160.0000 mg | ORAL_TABLET | Freq: Every day | ORAL | Status: DC

## 2016-04-01 MED ORDER — ISOSORBIDE MONONITRATE ER 30 MG PO TB24: 15.0000 mg | ORAL_TABLET | Freq: Every day | ORAL | Status: DC

## 2016-04-01 NOTE — Telephone Encounter (Signed)
Script called to pharmacy

## 2016-04-01 NOTE — Telephone Encounter (Signed)
I do not see documentation that we can speak with pt's son.  I placed call to pt but there was no answer.

## 2016-04-01 NOTE — Telephone Encounter (Signed)
Follow up     Patient son returning call back to nurse from this am

## 2016-04-01 NOTE — Telephone Encounter (Signed)
I tried number listed for pt again and no answer. I returned call to pt's son. He is with pt.  I spoke with pt who states we may speak with his son. I gave pt information from [place], [place].  Will send updated prescriptions to pharmacy.

## 2016-04-01 NOTE — Telephone Encounter (Deleted)
New Message   Pt c/o medication issue:  1. Name of Medication: patient is out with son - does not know the name of medication   2. How are you currently taking this medication (dosage and times per day)? Patient is not at home with son   3. Are you having a reaction (difficulty breathing--STAT)? Blurred eye - not now   4. What is your medication issue? Making b/p drop

## 2016-04-01 NOTE — Telephone Encounter (Signed)
Attempted to reach patient. No answer.

## 2016-04-03 ENCOUNTER — Ambulatory Visit: Payer: Medicare Other | Admitting: Cardiovascular Disease

## 2016-04-03 ENCOUNTER — Other Ambulatory Visit (HOSPITAL_COMMUNITY): Payer: Medicare Other

## 2016-04-09 ENCOUNTER — Encounter: Payer: Self-pay | Admitting: Cardiovascular Disease

## 2016-04-20 ENCOUNTER — Encounter: Payer: Self-pay | Admitting: Family Medicine

## 2016-04-20 ENCOUNTER — Ambulatory Visit (INDEPENDENT_AMBULATORY_CARE_PROVIDER_SITE_OTHER): Payer: Medicare Other | Admitting: Family Medicine

## 2016-04-20 ENCOUNTER — Ambulatory Visit (INDEPENDENT_AMBULATORY_CARE_PROVIDER_SITE_OTHER): Payer: Medicare Other

## 2016-04-20 VITALS — BP 132/53 | HR 75 | Temp 96.8°F | Ht 67.0 in | Wt 135.2 lb

## 2016-04-20 DIAGNOSIS — F411 Generalized anxiety disorder: Secondary | ICD-10-CM

## 2016-04-20 DIAGNOSIS — G621 Alcoholic polyneuropathy: Secondary | ICD-10-CM

## 2016-04-20 DIAGNOSIS — I10 Essential (primary) hypertension: Secondary | ICD-10-CM | POA: Diagnosis not present

## 2016-04-20 DIAGNOSIS — N400 Enlarged prostate without lower urinary tract symptoms: Secondary | ICD-10-CM | POA: Diagnosis not present

## 2016-04-20 DIAGNOSIS — K2100 Gastro-esophageal reflux disease with esophagitis, without bleeding: Secondary | ICD-10-CM

## 2016-04-20 DIAGNOSIS — D518 Other vitamin B12 deficiency anemias: Secondary | ICD-10-CM | POA: Diagnosis not present

## 2016-04-20 DIAGNOSIS — M545 Low back pain, unspecified: Secondary | ICD-10-CM

## 2016-04-20 DIAGNOSIS — J45909 Unspecified asthma, uncomplicated: Secondary | ICD-10-CM

## 2016-04-20 DIAGNOSIS — E785 Hyperlipidemia, unspecified: Secondary | ICD-10-CM | POA: Diagnosis not present

## 2016-04-20 DIAGNOSIS — E039 Hypothyroidism, unspecified: Secondary | ICD-10-CM

## 2016-04-20 DIAGNOSIS — R300 Dysuria: Secondary | ICD-10-CM

## 2016-04-20 DIAGNOSIS — J206 Acute bronchitis due to rhinovirus: Secondary | ICD-10-CM

## 2016-04-20 DIAGNOSIS — R0789 Other chest pain: Secondary | ICD-10-CM

## 2016-04-20 LAB — URINALYSIS, COMPLETE
BILIRUBIN UA: NEGATIVE
Glucose, UA: NEGATIVE
Ketones, UA: NEGATIVE
LEUKOCYTES UA: NEGATIVE
Nitrite, UA: NEGATIVE
PH UA: 7 (ref 5.0–7.5)
Protein, UA: NEGATIVE
Specific Gravity, UA: 1.015 (ref 1.005–1.030)
UUROB: 0.2 mg/dL (ref 0.2–1.0)

## 2016-04-20 LAB — MICROSCOPIC EXAMINATION: BACTERIA UA: NONE SEEN

## 2016-04-20 MED ORDER — DULOXETINE HCL 30 MG PO CPEP: 30.0000 mg | ORAL_CAPSULE | Freq: Every day | ORAL | Status: DC

## 2016-04-20 NOTE — Addendum Note (Signed)
Addended by: Marin Olp on: 04/20/2016 11:12 AM   Modules accepted: Orders

## 2016-04-20 NOTE — Patient Instructions (Signed)
Consider Rapaflo for your prostate. Talk to Dr Rosana Hoes about it.

## 2016-04-20 NOTE — Addendum Note (Signed)
Addended by: [name] on: 04/20/2016 11:18 AM   Modules accepted: Orders

## 2016-04-20 NOTE — Progress Notes (Addendum)
Subjective:  Patient ID: Albert Allen, male    DOB: November 03, 1923  Age: 82 y.o. MRN: [id number]  CC: Hypertension; Hyperlipidemia; Hypothyroidism; Gastroesophageal Reflux; and GAD   HPI Albert Allen presents for  follow-up of hypertension. Patient has no history of headache chest pain or shortness of breath or recent cough. Patient also denies symptoms of TIA such as numbness weakness lateralizing. Patient checks  blood pressure at home and has not had any elevated readings recently. Patient denies side effects from his medication. States taking it regularly. Patient in for follow-up of GERD. Currently asymptomatic taking  PPI daily. There is no chest pain or heartburn. No hematemesis and no melena. No dysphagia or choking. Onset is remote. Progression is stable. Complicating factors, none. Patient presents for follow-up on  thyroid. The patient has a history of hypothyroidism for many years. It has been stable recently. Pt. denies any change in  voice, loss of hair, heat or cold intolerance. Energy level has been adequate to good. Patient denies constipation and diarrhea. No myxedema. Medication is as noted below. Verified that pt is taking it daily on an empty stomach. Well tolerated.Patient in for follow-up of elevated cholesterol. Doing well without complaints on current medication. Denies side effects of statin including myalgia and arthralgia and nausea. Also in today for liver function testing. Currently no chest pain, shortness of breath or other cardiovascular related symptoms noted.  Flomax made him to nervous. Having to get up 4X a night. Has been going on a long time - years.   Getting some low back pain intermittently. Still active - mowing, mopping, vacuuming.   GAD 7 : Generalized Anxiety Score 04/20/2016  Nervous, Anxious, on Edge 3  Control/stop worrying 3  Worry too much - different things 3  Trouble relaxing 1  Restless 2  Easily annoyed or irritable 2  Afraid -  awful might happen 0  Total GAD 7 Score 14  Anxiety Difficulty Not difficult at all   Nerve problems treated for years with xanax - 1/2 at hs, 1/2 if ense in the AM. Sx worse because his wife's condition with Alzheimer's is worsening and she might have cancer.  Pt. Declines tx for prostate, back, taking too many pills    History Albert Allen has a past medical history of HYPOTHYROIDISM (05/31/2007); HYPERLIPIDEMIA (03/27/2009); GOUT (05/31/2007); ANEMIA, B12 DEFICIENCY (03/29/2008); Anxiety state, unspecified (11/19/2008); ABUSE, ALCOHOL, IN REMISSION (05/31/2007); CARPAL TUNNEL SYNDROME, RIGHT (09/05/2007); Alcoholic polyneuropathy ([place]) (03/29/2008); HYPERTENSION (05/31/2007); CAD, NATIVE VESSEL (04/04/2009); LEFT BUNDLE BRANCH BLOCK (02/11/2009); SINUSITIS, CHRONIC NOS (05/31/2007); ASTHMA (05/31/2007); GERD (05/31/2007); Chronic prostatitis (03/29/2008); LOC OSTEOARTHROS NOT SPEC PRIM/SEC LOWER LEG (03/20/2010); OSTEOARTHRITIS (05/31/2007); HIP PAIN (03/20/2010); DEGENERATIVE DISC DISEASE, CERVICAL SPINE (06/22/2007); Adhesive capsulitis of shoulder (12/29/2007); SYNCOPE (11/19/2008); WEIGHT LOSS, RECENT (11/19/2008); DYSPHAGIA UNSPECIFIED (09/25/2008); Hepatomegaly (09/25/2008); PSA, INCREASED (03/20/2010); ABNORMAL CV (STRESS) TEST (03/05/2009); PERSONAL HX COLONIC POLYPS (03/05/1997); Liver mass, left lobe; Hiatal hernia; Esophageal stricture; Sore throat; and History of echocardiogram.   He has past surgical history that includes pul. colapse w/chest tube; Tonsillectomy; carpel tunnel release; ORIF ulnar fracture (11/03/2012); Eye surgery; Inguinal hernia repair (12/18/2015); Inguinal hernia repair (Right, 12/18/2015); and Insertion of mesh (Right, 12/18/2015).   His family history includes Alcohol abuse in his father; Cancer in his brother and mother; Diabetes in his brother, sister, and sister; Lung disease in his father. There is no history of Colon cancer.He reports that he quit smoking about 18 years ago. His smoking use included  Cigarettes. He has never used  smokeless tobacco. He reports that he does not drink alcohol or use illicit drugs.    ROS Review of Systems  Constitutional: Negative for fever, chills, diaphoresis and unexpected weight change.  HENT: Negative for congestion, hearing loss, rhinorrhea and sore throat.   Eyes: Negative for visual disturbance.  Respiratory: Negative for cough and shortness of breath.   Cardiovascular: Negative for chest pain.  Gastrointestinal: Negative for abdominal pain, diarrhea and constipation.  Genitourinary: Positive for frequency. Negative for dysuria, hematuria and flank pain.  Musculoskeletal: Positive for back pain. Negative for joint swelling.  Skin: Negative for rash.  Neurological: Negative for dizziness and headaches.  Psychiatric/Behavioral: Positive for sleep disturbance. Negative for dysphoric mood.    Objective:  BP 132/53 mmHg  Pulse 75  Temp(Src) 96.8 F (36 C) (Oral)  Ht _0  (1.702 m)  Wt 135 lb 3.2 oz (61.326 kg)  BMI 21.17 kg/m2  SpO2 98%  BP Readings from Last 3 Encounters:  04/20/16 132/53  03/20/16 160/62  02/13/16 140/60    Wt Readings from Last 3 Encounters:  04/20/16 135 lb 3.2 oz (61.326 kg)  03/20/16 134 lb 1.9 oz (60.836 kg)  02/13/16 135 lb 6.4 oz (61.417 kg)     Physical Exam  Constitutional: He is oriented to person, place, and time. He appears well-developed and well-nourished. No distress.  HENT:  Head: Normocephalic and atraumatic.  Right Ear: External ear normal.  Left Ear: External ear normal.  Nose: Nose normal.  Mouth/Throat: Oropharynx is clear and moist.  Eyes: Conjunctivae and EOM are normal. Pupils are equal, round, and reactive to light.  Neck: Normal range of motion. Neck supple. No thyromegaly present.  Cardiovascular: Normal rate, regular rhythm and normal heart sounds.   No murmur heard. Pulmonary/Chest: Effort normal and breath sounds normal. No respiratory distress. He has no wheezes. He has no  rales.  Abdominal: Soft. Bowel sounds are normal. He exhibits no distension. There is no tenderness.  Lymphadenopathy:    He has no cervical adenopathy.  Neurological: He is alert and oriented to person, place, and time. He has normal reflexes.  Skin: Skin is warm and dry.  Psychiatric: He has a normal mood and affect. His behavior is normal. Judgment and thought content normal.     Lab Results  Component Value Date   WBC 3.9* 02/06/2016   HGB 12.2* 02/06/2016   HCT 35.8* 02/06/2016   PLT 181 02/06/2016   GLUCOSE 159* 02/06/2016   CHOL 164 07/29/2015   TRIG 452* 07/29/2015   HDL 41 07/29/2015   LDLDIRECT 67.7 01/12/2012   LDLCALC Comment 07/29/2015   ALT 9 07/29/2015   AST 14 07/29/2015   NA 131* 02/06/2016   K 4.5 02/06/2016   CL 96* 02/06/2016   CREATININE 1.04 02/06/2016   BUN 15 02/06/2016   CO2 25 02/06/2016   TSH 1.250 07/29/2015   PSA 1.42 01/12/2012   INR 0.97 11/03/2012   HGBA1C 6.1% 01/22/2015    No results found.  Assessment & Plan:   Davell was seen today for hypertension, hyperlipidemia, hypothyroidism, gastroesophageal reflux and gad.  Diagnoses and all orders for this visit:  ANEMIA, B12 DEFICIENCY -     Vitamin B12 -     CBC with Differential/Platelet -     ZLD35+TSVX  ALCOHOLIC POLYNEUROPATHY -     CMP14+EGFR  Asthma, unspecified asthma severity, uncomplicated -     BLT90+ZESP  BPH (benign prostatic hyperplasia) -     CMP14+EGFR  Essential hypertension -  CMP14+EGFR  Gastroesophageal reflux disease with esophagitis -     CMP14+EGFR  Hypothyroidism, unspecified hypothyroidism type -     CMP14+EGFR -     TSH + free T4  Hyperlipidemia -     CMP14+EGFR -     Lipid panel  Midline low back pain without sciatica -     DG Lumbar Spine 2-3 Views; Future  Dysuria -     Urinalysis, Complete  GAD (generalized anxiety disorder)  Other orders -     DULoxetine (CYMBALTA) 30 MG capsule; Take 1 capsule (30 mg total) by mouth daily.  For nerves    I am having Mr. Doswell start on DULoxetine. I am also having him maintain his aspirin EC, Tetrahydrozoline HCl (VISINE OP), pravastatin, pantoprazole, levothyroxine, albuterol, ALPRAZolam, isosorbide mononitrate, and valsartan.  Meds ordered this encounter  Medications  . DULoxetine (CYMBALTA) 30 MG capsule    Sig: Take 1 capsule (30 mg total) by mouth daily. For nerves    Dispense:  30 capsule    Refill:  3     Follow-up: Return in about 3 months (around 07/21/2016) for CPE.  [name], M.D.

## 2016-04-21 LAB — CMP14+EGFR
A/G RATIO: 1.7 (ref 1.2–2.2)
ALBUMIN: 4.3 g/dL (ref 3.2–4.6)
ALT: 12 IU/L (ref 0–44)
AST: 17 IU/L (ref 0–40)
Alkaline Phosphatase: 73 IU/L (ref 39–117)
BILIRUBIN TOTAL: 0.5 mg/dL (ref 0.0–1.2)
BUN / CREAT RATIO: 14 (ref 10–24)
BUN: 13 mg/dL (ref 10–36)
CALCIUM: 9.4 mg/dL (ref 8.6–10.2)
CHLORIDE: 96 mmol/L (ref 96–106)
CO2: 24 mmol/L (ref 18–29)
Creatinine, Ser: 0.94 mg/dL (ref 0.76–1.27)
GFR, EST AFRICAN AMERICAN: 82 mL/min/{1.73_m2} (ref >59)
GFR, EST NON AFRICAN AMERICAN: 71 mL/min/{1.73_m2} (ref >59)
Globulin, Total: 2.5 g/dL (ref 1.5–4.5)
Glucose: 87 mg/dL (ref 65–99)
POTASSIUM: 4.8 mmol/L (ref 3.5–5.2)
SODIUM: 137 mmol/L (ref 134–144)
TOTAL PROTEIN: 6.8 g/dL (ref 6.0–8.5)

## 2016-04-21 LAB — CBC WITH DIFFERENTIAL/PLATELET
BASOS: 0 %
Basophils Absolute: 0 10*3/uL (ref 0.0–0.2)
EOS (ABSOLUTE): 0 10*3/uL (ref 0.0–0.4)
EOS: 1 %
HEMATOCRIT: 36.7 % — AB (ref 37.5–51.0)
Hemoglobin: 12.4 g/dL — ABNORMAL LOW (ref 12.6–17.7)
IMMATURE GRANULOCYTES: 2 %
Immature Grans (Abs): 0.1 10*3/uL (ref 0.0–0.1)
LYMPHS ABS: 1.1 10*3/uL (ref 0.7–3.1)
Lymphs: 36 %
MCH: 33.5 pg — AB (ref 26.6–33.0)
MCHC: 33.8 g/dL (ref 31.5–35.7)
MCV: 99 fL — AB (ref 79–97)
MONOS ABS: 0.8 10*3/uL (ref 0.1–0.9)
Monocytes: 29 %
NEUTROS PCT: 32 %
Neutrophils Absolute: 0.9 10*3/uL — ABNORMAL LOW (ref 1.4–7.0)
Platelets: 203 10*3/uL (ref 150–379)
RBC: 3.7 x10E6/uL — ABNORMAL LOW (ref 4.14–5.80)
RDW: 13.9 % (ref 12.3–15.4)
WBC: 2.9 10*3/uL — AB (ref 3.4–10.8)

## 2016-04-21 LAB — TSH+FREE T4
Free T4: 1.7 ng/dL (ref 0.82–1.77)
TSH: 2.11 u[IU]/mL (ref 0.450–4.500)

## 2016-04-21 LAB — LIPID PANEL
CHOL/HDL RATIO: 3.7 ratio (ref 0.0–5.0)
Cholesterol, Total: 174 mg/dL (ref 100–199)
HDL: 47 mg/dL (ref >39)
LDL CALC: 74 mg/dL (ref 0–99)
Triglycerides: 263 mg/dL — ABNORMAL HIGH (ref 0–149)
VLDL Cholesterol Cal: 53 mg/dL — ABNORMAL HIGH (ref 5–40)

## 2016-04-21 LAB — VITAMIN B12: Vitamin B-12: 543 pg/mL (ref 211–946)

## 2016-04-21 MED ORDER — ALBUTEROL SULFATE HFA 108 (90 BASE) MCG/ACT IN AERS: 2.0000 | INHALATION_SPRAY | Freq: Four times a day (QID) | RESPIRATORY_TRACT | Status: DC | PRN

## 2016-04-21 MED ORDER — LEVOTHYROXINE SODIUM 75 MCG PO TABS: 75.0000 ug | ORAL_TABLET | Freq: Every day | ORAL | Status: DC

## 2016-04-21 MED ORDER — VALSARTAN 160 MG PO TABS: 160.0000 mg | ORAL_TABLET | Freq: Every day | ORAL | Status: DC

## 2016-04-21 MED ORDER — PANTOPRAZOLE SODIUM 40 MG PO TBEC: 40.0000 mg | DELAYED_RELEASE_TABLET | Freq: Every day | ORAL | Status: DC

## 2016-04-21 MED ORDER — ISOSORBIDE MONONITRATE ER 30 MG PO TB24: 15.0000 mg | ORAL_TABLET | Freq: Every day | ORAL | Status: DC

## 2016-04-21 NOTE — Addendum Note (Signed)
Addended by: Marin Olp on: 04/21/2016 05:47 PM   Modules accepted: Orders, Medications

## 2016-04-24 DIAGNOSIS — N401 Enlarged prostate with lower urinary tract symptoms: Secondary | ICD-10-CM | POA: Diagnosis not present

## 2016-04-28 ENCOUNTER — Other Ambulatory Visit: Payer: Self-pay | Admitting: Family Medicine

## 2016-04-28 NOTE — Telephone Encounter (Signed)
Patient seen 04/20/16 for anxiety

## 2016-05-25 ENCOUNTER — Ambulatory Visit (HOSPITAL_COMMUNITY)
Admission: RE | Admit: 2016-05-25 | Discharge: 2016-05-25 | Disposition: A | Discharge status: Home / Self Care | Payer: Medicare Other | Source: Ambulatory Visit | Attending: Family Medicine | Admitting: Family Medicine

## 2016-05-25 ENCOUNTER — Ambulatory Visit (INDEPENDENT_AMBULATORY_CARE_PROVIDER_SITE_OTHER): Payer: Medicare Other | Admitting: Family Medicine

## 2016-05-25 VITALS — BP 139/73 | HR 84 | Temp 96.5°F | Wt 135.0 lb

## 2016-05-25 DIAGNOSIS — X58XXXA Exposure to other specified factors, initial encounter: Secondary | ICD-10-CM | POA: Diagnosis not present

## 2016-05-25 DIAGNOSIS — S0990XA Unspecified injury of head, initial encounter: Secondary | ICD-10-CM | POA: Diagnosis not present

## 2016-05-25 DIAGNOSIS — R220 Localized swelling, mass and lump, head: Secondary | ICD-10-CM | POA: Diagnosis not present

## 2016-05-25 DIAGNOSIS — R51 Headache: Secondary | ICD-10-CM | POA: Diagnosis not present

## 2016-05-25 NOTE — Progress Notes (Signed)
   HPI  Patient presents today here after hitting his head.  Patient explains that earlier today he was cleaning some mats on the ground when he slipped and hit his forehead firmly on the ground in the grass. The patient states that he landed first on his head. He had severe head pain at the time but did not lose consciousness. He states that "I thought I was going to die".  He stated that it "Jotted him good" he did not lose consciousness.  He denies any dizziness, weakness or numbness, difficulty talking, or change in vision currently. His wife states that he drove them here, he is acting normally.  This occurred about 45 minutes ago his head wound has stopped bleeding.   PMH: Smoking status noted ROS: Per HPI  Objective: BP 139/73 mmHg  Pulse 84  Temp(Src) 96.5 F (35.8 C) (Oral)  Wt 135 lb (61.236 kg) Gen: NAD, alert, cooperative with exam HEENT: NCAT, EOMI, PERRL CV: RRR, good S1/S2, no murmur Resp: CTABL, no wheezes, non-labored Ext: No edema, warm Neuro: Alert and oriented, No gross deficits  Skin:  Laceration to forehead measuring 0.4 cm X 2.5 cm Abrasion to th enose and up[per lip Bleeding stopped before my exam  Assessment and plan:  # Head laceration, head trauma Given age and daily aspirin, along with severe injury I have ordered a stat CT scan of the head No signs of intracranial bleeding Bleeding has easily been controlled, will dress wound with a clean bandage.      Orders Placed This Encounter  Procedures  . CT HEAD WO CONTRAST    Standing Status: Future     Number of Occurrences:      Standing Expiration Date: 08/25/2017    Order Specific Question:  Reason for Exam (SYMPTOM  OR DIAGNOSIS REQUIRED)    Answer:  head injury    Order Specific Question:  Preferred imaging location?    Answer:  [place] 05/25/2016, 3:00 PM  Addendum CT of the head shows only tissue  swelling Nursing has called and discussed with family.  Laroy Apple, MD [facility] 05/25/2016, 7:21 PM

## 2016-06-03 ENCOUNTER — Other Ambulatory Visit: Payer: Self-pay | Admitting: Family Medicine

## 2016-06-03 NOTE — Telephone Encounter (Signed)
Dr.Stacks pt, last given 04/28/16 #30 no refills, if approved, please route to Pool B for nurse to call in.

## 2016-06-29 ENCOUNTER — Other Ambulatory Visit: Payer: Self-pay | Admitting: Family Medicine

## 2016-06-29 DIAGNOSIS — N401 Enlarged prostate with lower urinary tract symptoms: Secondary | ICD-10-CM | POA: Diagnosis not present

## 2016-06-29 DIAGNOSIS — R3911 Hesitancy of micturition: Secondary | ICD-10-CM | POA: Diagnosis not present

## 2016-06-29 DIAGNOSIS — J206 Acute bronchitis due to rhinovirus: Secondary | ICD-10-CM

## 2016-07-01 ENCOUNTER — Emergency Department (HOSPITAL_COMMUNITY)
Admission: EM | Admit: 2016-07-01 | Discharge: 2016-07-01 | Disposition: A | Discharge status: Home / Self Care | Payer: Medicare Other | Attending: Emergency Medicine | Admitting: Emergency Medicine

## 2016-07-01 ENCOUNTER — Encounter (HOSPITAL_COMMUNITY): Payer: Self-pay | Admitting: Emergency Medicine

## 2016-07-01 DIAGNOSIS — R112 Nausea with vomiting, unspecified: Secondary | ICD-10-CM | POA: Diagnosis not present

## 2016-07-01 DIAGNOSIS — I2510 Atherosclerotic heart disease of native coronary artery without angina pectoris: Secondary | ICD-10-CM | POA: Insufficient documentation

## 2016-07-01 DIAGNOSIS — R110 Nausea: Secondary | ICD-10-CM | POA: Diagnosis not present

## 2016-07-01 DIAGNOSIS — R079 Chest pain, unspecified: Secondary | ICD-10-CM | POA: Insufficient documentation

## 2016-07-01 DIAGNOSIS — Z79899 Other long term (current) drug therapy: Secondary | ICD-10-CM | POA: Insufficient documentation

## 2016-07-01 DIAGNOSIS — Z7982 Long term (current) use of aspirin: Secondary | ICD-10-CM | POA: Insufficient documentation

## 2016-07-01 DIAGNOSIS — Z87891 Personal history of nicotine dependence: Secondary | ICD-10-CM | POA: Insufficient documentation

## 2016-07-01 DIAGNOSIS — I10 Essential (primary) hypertension: Secondary | ICD-10-CM | POA: Insufficient documentation

## 2016-07-01 DIAGNOSIS — R39198 Other difficulties with micturition: Secondary | ICD-10-CM | POA: Diagnosis not present

## 2016-07-01 DIAGNOSIS — E039 Hypothyroidism, unspecified: Secondary | ICD-10-CM | POA: Insufficient documentation

## 2016-07-01 DIAGNOSIS — E871 Hypo-osmolality and hyponatremia: Secondary | ICD-10-CM

## 2016-07-01 DIAGNOSIS — J45909 Unspecified asthma, uncomplicated: Secondary | ICD-10-CM | POA: Diagnosis not present

## 2016-07-01 DIAGNOSIS — R109 Unspecified abdominal pain: Secondary | ICD-10-CM | POA: Diagnosis present

## 2016-07-01 HISTORY — DX: Other cervical disc degeneration, unspecified cervical region: M50.30

## 2016-07-01 LAB — COMPREHENSIVE METABOLIC PANEL
ALBUMIN: 4.3 g/dL (ref 3.5–5.0)
ALK PHOS: 61 U/L (ref 38–126)
ALT: 13 U/L — AB (ref 17–63)
AST: 21 U/L (ref 15–41)
Anion gap: 7 (ref 5–15)
BILIRUBIN TOTAL: 0.9 mg/dL (ref 0.3–1.2)
BUN: 14 mg/dL (ref 6–20)
CALCIUM: 8.8 mg/dL — AB (ref 8.9–10.3)
CO2: 24 mmol/L (ref 22–32)
CREATININE: 0.85 mg/dL (ref 0.61–1.24)
Chloride: 98 mmol/L — ABNORMAL LOW (ref 101–111)
GFR calc non Af Amer: >60 mL/min (ref >60)
GLUCOSE: 150 mg/dL — AB (ref 65–99)
Potassium: 3.8 mmol/L (ref 3.5–5.1)
SODIUM: 129 mmol/L — AB (ref 135–145)
TOTAL PROTEIN: 7.1 g/dL (ref 6.5–8.1)

## 2016-07-01 LAB — CBC
HCT: 37.2 % — ABNORMAL LOW (ref 39.0–52.0)
Hemoglobin: 13 g/dL (ref 13.0–17.0)
MCH: 34.2 pg — ABNORMAL HIGH (ref 26.0–34.0)
MCHC: 34.9 g/dL (ref 30.0–36.0)
MCV: 97.9 fL (ref 78.0–100.0)
PLATELETS: 179 10*3/uL (ref 150–400)
RBC: 3.8 MIL/uL — ABNORMAL LOW (ref 4.22–5.81)
RDW: 12.7 % (ref 11.5–15.5)
WBC: 2.5 10*3/uL — AB (ref 4.0–10.5)

## 2016-07-01 LAB — URINALYSIS, ROUTINE W REFLEX MICROSCOPIC
BILIRUBIN URINE: NEGATIVE
GLUCOSE, UA: NEGATIVE mg/dL
KETONES UR: NEGATIVE mg/dL
LEUKOCYTES UA: NEGATIVE
Nitrite: NEGATIVE
PH: 6 (ref 5.0–8.0)
PROTEIN: NEGATIVE mg/dL
Specific Gravity, Urine: <1.005 — ABNORMAL LOW (ref 1.005–1.030)

## 2016-07-01 LAB — URINE MICROSCOPIC-ADD ON
Bacteria, UA: NONE SEEN
Squamous Epithelial / LPF: NONE SEEN
WBC, UA: NONE SEEN WBC/hpf (ref 0–5)

## 2016-07-01 NOTE — ED Triage Notes (Signed)
Pt reports continued abd pain, nausea, and intermittent urinary retention. Pt reports was seen at urologist office and was started on medication but reports urologist said "cbladder was fine." pt reports last void prior to arrival. Pt denies pain,v/d.

## 2016-07-01 NOTE — ED Provider Notes (Signed)
[name] Provider Note   CSN: [id number] Arrival date & time: 07/01/16  1016  First Provider Contact:  First MD Initiated Contact with Patient 07/01/16 1047     By signing my name below, I, [name], attest that this documentation has been prepared under the direction and in the presence of Albert Blank, MD . Electronically Signed: [name], Scribe. 07/01/2016. 11:33 AM.  History   Chief Complaint Chief Complaint  Patient presents with  . Abdominal Pain   The history is provided by the patient. No language interpreter was used.  Abdominal Pain   This is a recurrent problem. The current episode started 3 to 5 hours ago. The problem has been gradually worsening. The quality of the pain is dull. Associated symptoms include nausea. Pertinent negatives include fever, diarrhea, vomiting, constipation, dysuria, frequency, hematuria and headaches. Nothing aggravates the symptoms. Nothing relieves the symptoms. Past workup includes CT scan. His past medical history is significant for GERD.   HPI Comments: Albert Allen is a 82 y.o. male with PMHx of HTN, HLD, GERD, hypothyroidism and CAD, who presents to the Emergency Department complaining of gradually worsening, "dull," abdominal distention, chronic nausea and urinary retention that worsened this morning. He notes his urinary retention began "a while ago;" he states he usually urinates ~6 times a night but only urinated twice last night. He states he has the urge to urinate more but his flow will spontaneously stop. He also notes associated hot flashes, fatigue and chest pressure due to an enlarged hiatal hernia. He reports PSHX of right inguinal hernia repair on 12/08/15.   He reports he visited his urologist recently in which he was told he had a bladder neck obstruction; he had a CT of his bladder and told it was normal. Pt also notes he had his prostate checked which was boggy but normal. Pt denies vomiting, fever,  shortness of breath and constipation. He reports hx of alcohol abuse but notes his last drink was ~6 months ago.   Past Medical History:  Diagnosis Date  . ABNORMAL CV (STRESS) TEST 03/05/2009  . ABUSE, ALCOHOL, IN REMISSION 05/31/2007  . Adhesive capsulitis of shoulder 12/29/2007  . Alcoholic polyneuropathy ([place]) 03/29/2008  . ANEMIA, B12 DEFICIENCY 03/29/2008  . Anxiety state, unspecified 11/19/2008  . ASTHMA 05/31/2007  . CAD, NATIVE VESSEL 04/04/2009  . CARPAL TUNNEL SYNDROME, RIGHT 09/05/2007  . Chronic prostatitis 03/29/2008  . Degenerative disc disease, cervical   . DEGENERATIVE DISC DISEASE, CERVICAL SPINE 06/22/2007  . DYSPHAGIA UNSPECIFIED 09/25/2008  . Esophageal stricture   . GERD 05/31/2007  . GOUT 05/31/2007  . Hepatomegaly 09/25/2008  . Hiatal hernia   . HIP PAIN 03/20/2010  . History of echocardiogram    Echo 4/17 - mild LVH, EF 50-55%, Gr 1 DD, trivial AI, MAC, mild MR  . HYPERLIPIDEMIA 03/27/2009  . HYPERTENSION 05/31/2007  . HYPOTHYROIDISM 05/31/2007  . LEFT BUNDLE BRANCH BLOCK 02/11/2009  . Liver mass, left lobe   . LOC OSTEOARTHROS NOT SPEC PRIM/SEC LOWER LEG 03/20/2010  . OSTEOARTHRITIS 05/31/2007  . PERSONAL HX COLONIC POLYPS 03/05/1997   adenomatous   . PSA, INCREASED 03/20/2010  . SINUSITIS, CHRONIC NOS 05/31/2007  . Sore throat    CURRENTLY  . SYNCOPE 11/19/2008  . WEIGHT LOSS, RECENT 11/19/2008    Patient Active Problem List   Diagnosis Date Noted  . Midline low back pain without sciatica 04/20/2016  . Right inguinal hernia 12/18/2015  . Seasonal allergic rhinitis 04/17/2013  . BPH (  benign prostatic hyperplasia) 04/17/2013  . Diverticulosis of colon (without mention of hemorrhage) 06/15/2011  . Hernia, hiatal 06/15/2011  . Diverticulum of duodenum 06/15/2011  . PERSONAL HX COLONIC POLYPS 03/25/2010  . PSA, INCREASED 03/20/2010  . CAD, NATIVE VESSEL 04/04/2009  . Hyperlipidemia 03/27/2009  . LEFT BUNDLE BRANCH BLOCK 02/11/2009  . HEPATOMEGALY 09/25/2008  . ANEMIA,  B12 DEFICIENCY 03/29/2008  . ALCOHOLIC POLYNEUROPATHY [id number]  . Hypothyroidism 05/31/2007  . ABUSE, ALCOHOL, IN REMISSION 05/31/2007  . Essential hypertension 05/31/2007  . Asthma 05/31/2007  . GERD 05/31/2007  . OSTEOARTHRITIS 05/31/2007    Past Surgical History:  Procedure Laterality Date  . carpel tunnel release    . EYE SURGERY    . INGUINAL HERNIA REPAIR  12/18/2015  . INGUINAL HERNIA REPAIR Right 12/18/2015   Procedure: OPEN REPAIR RIGHT INGUINAL HERNIA ;  Surgeon: Greer Pickerel, MD;  Location: [facility];  Service: General;  Laterality: Right;  . INSERTION OF MESH Right 12/18/2015   Procedure: INSERTION OF MESH;  Surgeon: Greer Pickerel, MD;  Location: [facility];  Service: General;  Laterality: Right;  . ORIF ULNAR FRACTURE  11/03/2012   Procedure: OPEN REDUCTION INTERNAL FIXATION (ORIF) ULNAR FRACTURE;  Surgeon: Hessie Dibble, MD;  Location: [facility];  Service: Orthopedics;  Laterality: Right;  ORIF radius and ulnar fractures  . pul. colapse w/chest tube    . TONSILLECTOMY      Home Medications    Prior to Admission medications   Medication Sig Start Date End Date Taking? Authorizing Provider  albuterol (PROAIR HFA) 108 (90 Base) MCG/ACT inhaler Inhale 2 puffs into the lungs every 6 (six) hours as needed for wheezing or shortness of breath. 04/21/16  Yes [name], MD  ALPRAZolam Duanne Moron) 0.25 MG tablet TAKE ONE TABLET AT BEDTIME 06/05/16  Yes Eustaquio Maize, MD  aspirin EC 81 MG tablet Take 81 mg by mouth daily.   Yes Historical Provider, MD  B Complex Vitamins (VITAMIN B-COMPLEX PO) Take 1 tablet by mouth daily.   Yes Historical Provider, MD  bismuth subsalicylate (PEPTO BISMOL) 262 MG/15ML suspension Take 30 mLs by mouth every 6 (six) hours as needed for indigestion.   Yes Historical Provider, MD  Cholecalciferol (VITAMIN D PO) Take 1 tablet by mouth daily.   Yes Historical Provider, MD  isosorbide mononitrate (IMDUR) 30 MG 24 hr tablet Take 0.5 tablets (15 mg total) by mouth  daily. Patient taking differently: Take 15 mg by mouth every evening.  04/21/16  Yes [name], MD  levothyroxine (SYNTHROID, LEVOTHROID) 75 MCG tablet Take 1 tablet (75 mcg total) by mouth daily before breakfast. 06/29/16  Yes [name], MD  pantoprazole (PROTONIX) 40 MG tablet Take 1 tablet (40 mg total) by mouth daily. 04/21/16  Yes [name], MD  pravastatin (PRAVACHOL) 40 MG tablet Take 1 tablet (40 mg total) by mouth daily. Patient taking differently: Take 40 mg by mouth every evening.  01/07/16  Yes Wardell Honour, MD  silodosin (RAPAFLO) 4 MG CAPS capsule Take 4 mg by mouth every evening.   Yes Historical Provider, MD  Tetrahydrozoline HCl (VISINE OP) Place 1 drop into both eyes daily as needed (dry eyes).   Yes Historical Provider, MD  DULoxetine (CYMBALTA) 30 MG capsule Take 1 capsule (30 mg total) by mouth daily. For nerves Patient not taking: Reported on 07/01/2016 04/20/16   [name], MD  valsartan (DIOVAN) 160 MG tablet Take 1 tablet (160 mg total) by mouth daily. Patient not taking: Reported on 07/01/2016  04/21/16   [name], MD    Family History Family History  Problem Relation Age of Onset  . Cancer Mother     In back but unsure of what type of cancer  . Alcohol abuse Father   . Lung disease Father     abscess  . Diabetes Sister   . Cancer Brother     throat  . Diabetes Brother   . Diabetes Sister   . Colon cancer Neg Hx     Social History Social History  Substance Use Topics  . Smoking status: Former Smoker    Types: Cigarettes    Quit date: 04/24/1998  . Smokeless tobacco: Never Used  . Alcohol use No     Comment: 3 mos   Allergies   Amoxicillin; Penicillins; Ceclor [cefaclor]; Celebrex [celecoxib]; and Levaquin [levofloxacin in d5w]  Review of Systems Review of Systems  Constitutional: Negative for appetite change, chills, fatigue and fever.  HENT: Negative for congestion, ear pain, facial swelling, mouth sores and sore throat.   Eyes:  Negative for visual disturbance.  Respiratory: Negative for cough, chest tightness and shortness of breath.   Cardiovascular: Positive for chest pain. Negative for palpitations.  Gastrointestinal: Positive for abdominal pain and nausea. Negative for blood in stool, constipation, diarrhea and vomiting.  Endocrine: Negative for cold intolerance and heat intolerance.  Genitourinary: Positive for difficulty urinating. Negative for decreased urine volume, dysuria, flank pain, frequency, hematuria and penile pain.  Musculoskeletal: Negative for back pain and neck stiffness.  Skin: Negative for rash.  Neurological: Negative for dizziness, weakness, light-headedness and headaches.  All other systems reviewed and are negative.  Physical Exam Updated Vital Signs BP 175/71 (BP Location: Right Arm)   Pulse 85   Temp 97.7 F (36.5 C) (Oral)   Resp 18   Ht 5\' 7"  (1.702 m)   Wt 136 lb (61.7 kg)   SpO2 95%   BMI 21.30 kg/m   Physical Exam  Constitutional: He is oriented to person, place, and time. He appears well-nourished. No distress.  HENT:  Head: Normocephalic and atraumatic.  Right Ear: External ear normal.  Left Ear: External ear normal.  Eyes: Pupils are equal, round, and reactive to light. Right eye exhibits no discharge. Left eye exhibits no discharge. No scleral icterus.  Neck: Normal range of motion. Neck supple.  Cardiovascular: Normal rate.  Exam reveals no gallop and no friction rub.   No murmur heard. Pulmonary/Chest: Effort normal and breath sounds normal. No stridor. No respiratory distress. He has no wheezes. He has no rales. He exhibits no tenderness.  Abdominal: Soft. He exhibits no distension and no mass. There is no tenderness. There is no rebound and no guarding.  Musculoskeletal: He exhibits no edema or tenderness.  Neurological: He is alert and oriented to person, place, and time.  Skin: Skin is warm and dry. No rash noted. He is not diaphoretic. No erythema.   ED  Treatments / Results  Labs (all labs ordered are listed, but only abnormal results are displayed) Labs Reviewed  COMPREHENSIVE METABOLIC PANEL - Abnormal; Notable for the following:       Result Value   Sodium 129 (*)    Chloride 98 (*)    Glucose, Bld 150 (*)    Calcium 8.8 (*)    ALT 13 (*)    All other components within normal limits  CBC - Abnormal; Notable for the following:    WBC 2.5 (*)    RBC 3.80 (*)  HCT 37.2 (*)    MCH 34.2 (*)    All other components within normal limits  URINALYSIS, ROUTINE W REFLEX MICROSCOPIC (NOT AT [facility]) - Abnormal; Notable for the following:    Specific Gravity, Urine <1.005 (*)    Hgb urine dipstick SMALL (*)    All other components within normal limits  URINE MICROSCOPIC-ADD ON    EKG  EKG Interpretation None       Radiology No results found.  Procedures Procedures   Emergency Focused Ultrasound Exam Limited Retroperitoneal Ultrasound of Kidneys  Performed and interpreted by Dr. Leonette Monarch Focused abdominal ultrasound with both kidneys and bladder imaged in transverse and longitudinal planes in real-time. Indication: flank pain Findings: bilateral kidneys present, no shadowing, no anechoic areas Interpretation: no hydronephrosis visualized.  no stones or cysts visualized  Images archived electronically  CPT Code: [phone]  Emergency Focused Ultrasound Exam Limited ultrasound of the Bladder.   Performed and interpreted by Dr. Leonette Monarch Indication: evaluation for urinary retention Transverse and Sagittal views of the bladder are obtained and calculations are performed to determine an estimated bladder volume for signs of post-renal obstruction.  Findings: Bladder is full with an estimated 145 cc. Interpretation: there is evidence of outlet mild obstruction Images archived electronically.  CPT Codes: [id number] and [phone]   DIAGNOSTIC STUDIES:  Oxygen Saturation is 95% on RA, normal by my interpretation.    COORDINATION OF  CARE:  11:22 AM Discussed treatment plan, which includes US abdomen with pt at bedside and pt agreed to plan.  Medications Ordered in ED Medications - No data to display   Initial Impression / Assessment and Plan / ED Course  I have reviewed the triage vital signs and the nursing notes.  Pertinent labs & imaging results that were available during my care of the patient were reviewed by me and considered in my medical decision making (see chart for details).  Clinical Course   Physical to voiding for several months who is currently being evaluated by urologist and was recently placed on no medication presents for the same. No acute changes. Does endorse some nausea associated with the retention. Rest of the history as above.  Bedside ultrasound revealed a postvoid residual of 145cc. No evidence of hydronephrosis. Labs without evidence of urinary tract infection AKA or significant electrolyte derangements. There was evidence of mild hyponatremia patient was instructed to follow-up with his primary care provider for repeat metabolic panel.   Do not feel that a catheter would be beneficial at this time. Pt is still able to void.  Patient is safe for discharge with strict return precautions. Patient to follow-up with his primary care provider and urologist.  Final Clinical Impressions(s) / ED Diagnoses   Final diagnoses:  Voiding difficulty  Hyponatremia   Disposition: Discharge  Condition: Good  I have discussed the results, Dx and Tx plan with the patient and family who expressed understanding and agree(s) with the plan. Discharge instructions discussed at great length. The patient and family were given strict return precautions who verbalized understanding of the instructions. No further questions at time of discharge.    Discharge Medication List as of 07/01/2016 12:40 PM      Follow Up: [name], MD [place] [address] [phone]  Schedule an  appointment as soon as possible for a visit in 1 week for recheck of your sodium  your Urologist or new Urologist for second opinion      I personally performed the services described  in this documentation, which was scribed in my presence. The recorded information has been reviewed and is accurate.     Albert Blank, MD 07/01/16 [phone]

## 2016-07-01 NOTE — Discharge Instructions (Signed)
Attached are some instructions and education on urinary retention. Please read in order to become familiar with some of the signs and symptoms that would require urgent evaluation.

## 2016-07-06 ENCOUNTER — Other Ambulatory Visit: Payer: Self-pay | Admitting: Pediatrics

## 2016-07-07 NOTE — Telephone Encounter (Signed)
rx called into [facility] per Dr Wendi Snipes

## 2016-07-21 ENCOUNTER — Other Ambulatory Visit: Payer: Self-pay | Admitting: Family Medicine

## 2016-07-21 ENCOUNTER — Ambulatory Visit (INDEPENDENT_AMBULATORY_CARE_PROVIDER_SITE_OTHER): Payer: Medicare Other | Admitting: Family Medicine

## 2016-07-21 ENCOUNTER — Encounter: Payer: Self-pay | Admitting: Family Medicine

## 2016-07-21 ENCOUNTER — Ambulatory Visit (HOSPITAL_COMMUNITY)
Admission: RE | Admit: 2016-07-21 | Discharge: 2016-07-21 | Disposition: A | Discharge status: Home / Self Care | Payer: Medicare Other | Source: Ambulatory Visit | Attending: Family Medicine | Admitting: Family Medicine

## 2016-07-21 VITALS — BP 140/63 | HR 77 | Temp 97.0°F | Ht 67.0 in | Wt 136.8 lb

## 2016-07-21 DIAGNOSIS — K5730 Diverticulosis of large intestine without perforation or abscess without bleeding: Secondary | ICD-10-CM

## 2016-07-21 DIAGNOSIS — E785 Hyperlipidemia, unspecified: Secondary | ICD-10-CM | POA: Diagnosis not present

## 2016-07-21 DIAGNOSIS — G4700 Insomnia, unspecified: Secondary | ICD-10-CM

## 2016-07-21 DIAGNOSIS — E039 Hypothyroidism, unspecified: Secondary | ICD-10-CM | POA: Diagnosis not present

## 2016-07-21 DIAGNOSIS — R110 Nausea: Secondary | ICD-10-CM

## 2016-07-21 DIAGNOSIS — R1030 Lower abdominal pain, unspecified: Secondary | ICD-10-CM | POA: Diagnosis not present

## 2016-07-21 DIAGNOSIS — F411 Generalized anxiety disorder: Secondary | ICD-10-CM

## 2016-07-21 DIAGNOSIS — N400 Enlarged prostate without lower urinary tract symptoms: Secondary | ICD-10-CM

## 2016-07-21 DIAGNOSIS — D518 Other vitamin B12 deficiency anemias: Secondary | ICD-10-CM | POA: Diagnosis not present

## 2016-07-21 DIAGNOSIS — I10 Essential (primary) hypertension: Secondary | ICD-10-CM | POA: Diagnosis not present

## 2016-07-21 DIAGNOSIS — R972 Elevated prostate specific antigen [PSA]: Secondary | ICD-10-CM

## 2016-07-21 MED ORDER — ALPRAZOLAM 0.5 MG PO TABS: 0.5000 mg | ORAL_TABLET | Freq: Every day | ORAL | 0 refills | Status: DC

## 2016-07-21 MED ORDER — IOPAMIDOL (ISOVUE-300) INJECTION 61%
100.0000 mL | Freq: Once | INTRAVENOUS | Status: AC | PRN
  Administered 2016-07-21: 100 mL via INTRAVENOUS

## 2016-07-21 NOTE — Progress Notes (Signed)
Subjective:  Patient ID: Albert Allen, male    DOB: Dec 25, 1922  Age: 82 y.o. MRN: [id number]  CC: Hyperlipidemia (3 mth rck); Hypothyroidism; Hypertension; and Abdominal Pain (continued with nausea)   HPI Albert Allen presents for  follow-up of hypertension. Patient has no history of headache chest pain or shortness of breath or recent cough. Patient also denies symptoms of TIA such as numbness weakness lateralizing. Patient checks  blood pressure at home and has not had any elevated readings recently. Patient denies side effects from his medication. States taking it regularly.  Patient also  in for follow-up of elevated cholesterol. Doing well without complaints on current medication. Denies side effects of statin including myalgia and arthralgia and nausea. Also in today for liver function testing. Currently no chest pain, shortness of breath or other cardiovascular related symptoms noted.  Patient presents for follow-up on  thyroid. The patient has a history of hypothyroidism for many years. It has been stable recently. Pt. denies any change in  voice, loss of hair, heat or cold intolerance. Energy level has been adequate to good. Patient denies constipation and diarrhea. No myxedema. Medication is as noted below. Verified that pt is taking it daily on an empty stomach. Well tolerated.-  Has a sensation of nausea that starts about 5:00 in the morning and lasts until he gets himself up out of bed. He says that he never gets a good night sleep. He says he's awake most of the night. He goes to bed about 9:30 and will sleep until about midnight then he might goes a little bit but mostly he is awake the rest of the night. He states that the abdominal pain is not present it is simply nausea. The nausea is not epigastric it is not heartburn. It was present before he started taking the Rapaflo. He feels nervous a lot. He is stressed out and worried because his wife was just diagnosed with a  high-grade kidney tumor and is about to undergo a right nephrectomy. This is on top of the fact that she has significant Alzheimer's dementia.  History Albert Allen has a past medical history of ABNORMAL CV (STRESS) TEST (03/05/2009); ABUSE, ALCOHOL, IN REMISSION (05/31/2007); Adhesive capsulitis of shoulder (12/29/2007); Alcoholic polyneuropathy ([place]) (03/29/2008); ANEMIA, B12 DEFICIENCY (03/29/2008); Anxiety state, unspecified (11/19/2008); ASTHMA (05/31/2007); CAD, NATIVE VESSEL (04/04/2009); CARPAL TUNNEL SYNDROME, RIGHT (09/05/2007); Chronic prostatitis (03/29/2008); Degenerative disc disease, cervical; DEGENERATIVE DISC DISEASE, CERVICAL SPINE (06/22/2007); DYSPHAGIA UNSPECIFIED (09/25/2008); Esophageal stricture; GERD (05/31/2007); GOUT (05/31/2007); Hepatomegaly (09/25/2008); Hiatal hernia; HIP PAIN (03/20/2010); History of echocardiogram; HYPERLIPIDEMIA (03/27/2009); HYPERTENSION (05/31/2007); HYPOTHYROIDISM (05/31/2007); LEFT BUNDLE BRANCH BLOCK (02/11/2009); Liver mass, left lobe; LOC OSTEOARTHROS NOT SPEC PRIM/SEC LOWER LEG (03/20/2010); OSTEOARTHRITIS (05/31/2007); PERSONAL HX COLONIC POLYPS (03/05/1997); PSA, INCREASED (03/20/2010); SINUSITIS, CHRONIC NOS (05/31/2007); Sore throat; SYNCOPE (11/19/2008); and WEIGHT LOSS, RECENT (11/19/2008).   He has a past surgical history that includes pul. colapse w/chest tube; Tonsillectomy; carpel tunnel release; ORIF ulnar fracture (11/03/2012); Eye surgery; Inguinal hernia repair (12/18/2015); Inguinal hernia repair (Right, 12/18/2015); and Insertion of mesh (Right, 12/18/2015).   His family history includes Alcohol abuse in his father; Cancer in his brother and mother; Diabetes in his brother, sister, and sister; Lung disease in his father.He reports that he quit smoking about 18 years ago. His smoking use included Cigarettes. He has never used smokeless tobacco. He reports that he does not drink alcohol or use drugs.  Current Outpatient Prescriptions on File Prior to Visit  Medication Sig  Dispense Refill  .  levothyroxine (SYNTHROID, LEVOTHROID) 75 MCG tablet Take 1 tablet (75 mcg total) by mouth daily before breakfast. 90 tablet 0  . pantoprazole (PROTONIX) 40 MG tablet Take 1 tablet (40 mg total) by mouth daily. 90 tablet 4  . silodosin (RAPAFLO) 4 MG CAPS capsule Take 4 mg by mouth every evening.    . valsartan (DIOVAN) 160 MG tablet Take 1 tablet (160 mg total) by mouth daily. 90 tablet 3   No current facility-administered medications on file prior to visit.     ROS Review of Systems  Objective:  BP 140/63 (BP Location: Left Arm, Patient Position: Sitting, Cuff Size: Normal)   Pulse 77   Temp 97 F (36.1 C) (Oral)   Ht 5' 7" (1.702 m)   Wt 136 lb 12.8 oz (62.1 kg)   SpO2 99%   BMI 21.43 kg/m   BP Readings from Last 3 Encounters:  07/21/16 140/63  07/01/16 166/74  05/25/16 139/73    Wt Readings from Last 3 Encounters:  07/21/16 136 lb 12.8 oz (62.1 kg)  07/01/16 136 lb (61.7 kg)  05/25/16 135 lb (61.2 kg)     Physical Exam  Lab Results  Component Value Date   HGBA1C 6.1% 01/22/2015   HGBA1C 6.4 (H) 11/19/2008    Lab Results  Component Value Date   WBC 2.5 (L) 07/01/2016   HGB 13.0 07/01/2016   HCT 37.2 (L) 07/01/2016   PLT 179 07/01/2016   GLUCOSE 150 (H) 07/01/2016   CHOL 174 04/20/2016   TRIG 263 (H) 04/20/2016   HDL 47 04/20/2016   LDLDIRECT 67.7 01/12/2012   LDLCALC 74 04/20/2016   ALT 13 (L) 07/01/2016   AST 21 07/01/2016   NA 129 (L) 07/01/2016   K 3.8 07/01/2016   CL 98 (L) 07/01/2016   CREATININE 0.85 07/01/2016   BUN 14 07/01/2016   CO2 24 07/01/2016   TSH 2.110 04/20/2016   PSA 1.42 01/12/2012   INR 0.97 11/03/2012   HGBA1C 6.1% 01/22/2015    No results found.  Assessment & Plan:   Albert Allen was seen today for hyperlipidemia, hypothyroidism, hypertension and abdominal pain.  Diagnoses and all orders for this visit:  ANEMIA, B12 DEFICIENCY -     CBC with Differential/Platelet -     CMP14+EGFR -     Vitamin  B12  BPH (benign prostatic hyperplasia) -     CBC with Differential/Platelet -     CMP14+EGFR -     CT ABDOMEN PELVIS W WO CONTRAST; Future  Essential hypertension -     CBC with Differential/Platelet -     CMP14+EGFR  Diverticulosis of large intestine without hemorrhage -     CBC with Differential/Platelet -     CMP14+EGFR -     CT ABDOMEN PELVIS W WO CONTRAST; Future  Hyperlipidemia -     CBC with Differential/Platelet -     CMP14+EGFR -     Lipid panel  Hypothyroidism, unspecified hypothyroidism type -     CBC with Differential/Platelet -     CMP14+EGFR  PSA, INCREASED -     CBC with Differential/Platelet -     CMP14+EGFR  Nausea without vomiting -     CMP14+EGFR -     Amylase -     Lipase -     CT ABDOMEN PELVIS W WO CONTRAST; Future  Lower abdominal pain -     CT ABDOMEN PELVIS W WO CONTRAST; Future  Insomnia  GAD (generalized anxiety disorder)  Other orders -  ALPRAZolam (XANAX) 0.5 MG tablet; Take 1 tablet (0.5 mg total) by mouth at bedtime.   I have discontinued Mr. Gurganus aspirin EC, Tetrahydrozoline HCl (VISINE OP), pravastatin, DULoxetine, albuterol, isosorbide mononitrate, bismuth subsalicylate, B Complex Vitamins (VITAMIN B-COMPLEX PO), and Cholecalciferol (VITAMIN D PO). I have also changed his ALPRAZolam. Additionally, I am having him maintain his pantoprazole, valsartan, levothyroxine, and silodosin.  Meds ordered this encounter  Medications  . ALPRAZolam (XANAX) 0.5 MG tablet    Sig: Take 1 tablet (0.5 mg total) by mouth at bedtime.    Dispense:  30 tablet    Refill:  0     Follow-up: Return in about 2 weeks (around 08/04/2016) for nausea, polypharmacy.  [name], M.D.

## 2016-07-22 LAB — CMP14+EGFR
ALT: 7 IU/L (ref 0–44)
AST: 12 IU/L (ref 0–40)
Albumin/Globulin Ratio: 2 (ref 1.2–2.2)
Albumin: 4.4 g/dL (ref 3.2–4.6)
Alkaline Phosphatase: 78 IU/L (ref 39–117)
BUN/Creatinine Ratio: 16 (ref 10–24)
BUN: 12 mg/dL (ref 10–36)
Bilirubin Total: 0.6 mg/dL (ref 0.0–1.2)
CALCIUM: 9.2 mg/dL (ref 8.6–10.2)
CO2: 26 mmol/L (ref 18–29)
CREATININE: 0.77 mg/dL (ref 0.76–1.27)
Chloride: 91 mmol/L — ABNORMAL LOW (ref 96–106)
GFR calc non Af Amer: 79 mL/min/{1.73_m2} (ref >59)
GFR, EST AFRICAN AMERICAN: 92 mL/min/{1.73_m2} (ref >59)
Globulin, Total: 2.2 g/dL (ref 1.5–4.5)
Glucose: 85 mg/dL (ref 65–99)
Potassium: 4.7 mmol/L (ref 3.5–5.2)
Sodium: 131 mmol/L — ABNORMAL LOW (ref 134–144)
TOTAL PROTEIN: 6.6 g/dL (ref 6.0–8.5)

## 2016-07-22 LAB — LIPASE: Lipase: 64 U/L — ABNORMAL HIGH (ref 0–59)

## 2016-07-22 LAB — LIPID PANEL
CHOL/HDL RATIO: 3.1 ratio (ref 0.0–5.0)
Cholesterol, Total: 165 mg/dL (ref 100–199)
HDL: 54 mg/dL (ref >39)
LDL Calculated: 80 mg/dL (ref 0–99)
TRIGLYCERIDES: 155 mg/dL — AB (ref 0–149)
VLDL CHOLESTEROL CAL: 31 mg/dL (ref 5–40)

## 2016-07-22 LAB — CBC WITH DIFFERENTIAL/PLATELET
BASOS ABS: 0 10*3/uL (ref 0.0–0.2)
Basos: 0 %
EOS (ABSOLUTE): 0 10*3/uL (ref 0.0–0.4)
Eos: 1 %
Hematocrit: 36.3 % — ABNORMAL LOW (ref 37.5–51.0)
Hemoglobin: 12.3 g/dL — ABNORMAL LOW (ref 12.6–17.7)
IMMATURE GRANS (ABS): 0.1 10*3/uL (ref 0.0–0.1)
IMMATURE GRANULOCYTES: 2 %
LYMPHS: 40 %
Lymphocytes Absolute: 1.2 10*3/uL (ref 0.7–3.1)
MCH: 33.5 pg — ABNORMAL HIGH (ref 26.6–33.0)
MCHC: 33.9 g/dL (ref 31.5–35.7)
MCV: 99 fL — AB (ref 79–97)
MONOCYTES: 23 %
Monocytes Absolute: 0.6 10*3/uL (ref 0.1–0.9)
NEUTROS ABS: 0.9 10*3/uL — AB (ref 1.4–7.0)
NEUTROS PCT: 34 %
PLATELETS: 199 10*3/uL (ref 150–379)
RBC: 3.67 x10E6/uL — AB (ref 4.14–5.80)
RDW: 13.8 % (ref 12.3–15.4)
WBC: 2.8 10*3/uL — ABNORMAL LOW (ref 3.4–10.8)

## 2016-07-22 LAB — VITAMIN B12: Vitamin B-12: 607 pg/mL (ref 211–946)

## 2016-07-22 LAB — AMYLASE: AMYLASE: 93 U/L (ref 31–124)

## 2016-07-29 ENCOUNTER — Encounter: Payer: Self-pay | Admitting: Cardiovascular Disease

## 2016-07-29 ENCOUNTER — Ambulatory Visit (INDEPENDENT_AMBULATORY_CARE_PROVIDER_SITE_OTHER): Payer: Medicare Other | Admitting: Cardiovascular Disease

## 2016-07-29 VITALS — BP 135/65 | HR 96 | Ht 67.0 in | Wt 134.8 lb

## 2016-07-29 DIAGNOSIS — I10 Essential (primary) hypertension: Secondary | ICD-10-CM

## 2016-07-29 DIAGNOSIS — I2510 Atherosclerotic heart disease of native coronary artery without angina pectoris: Secondary | ICD-10-CM

## 2016-07-29 NOTE — Progress Notes (Signed)
Chief Complaint  Patient presents with  . Coronary Artery Disease    History of Present Illness: 82 yo WM with past medical history significant for CAD, HTN, anemia, asthma/COPD, GERD, OA, gout hypothyroidism, hiatal hernia and alcohol abuse here today for follow up. Cardiac cath was performed on 03/13/09 and we found a 70-80% heavily calcified mid LAD that was severely diseased throughout the mid section with heavy calcification. The LAD was overall just moderate in size and not a good vessel for PCI. There was diffuse moderately obstructive disease in the other vessels. We elected for medical management given that he was asymptomatic. No stents were placed. [facility] October 2015 with mild bilateral carotid artery disease, normal ABI.  Echo 2014 showed normal LV size and function with mild AI and mild MR. Head CT without acute changes. Echo 03/18/16 with normal LV function, mild LVH, trivial AI, mild MR. His chest pain is felt to be complicated by GERD and anxiety.   He is here today for follow up. He has been feeling well. He has no chest pain or SOB. He moved furniture all day yesterday with his son and had no problems. His ASA was stopped due to possible GI issues. His statin was also stopped. He has been very stressed due to his wife's dementia.    Primary Care Physician: [name], MD   Past Medical History:  Diagnosis Date  . ABNORMAL CV (STRESS) TEST 03/05/2009  . ABUSE, ALCOHOL, IN REMISSION 05/31/2007  . Adhesive capsulitis of shoulder 12/29/2007  . Alcoholic polyneuropathy ([place]) 03/29/2008  . ANEMIA, B12 DEFICIENCY 03/29/2008  . Anxiety state, unspecified 11/19/2008  . ASTHMA 05/31/2007  . CAD, NATIVE VESSEL 04/04/2009  . CARPAL TUNNEL SYNDROME, RIGHT 09/05/2007  . Chronic prostatitis 03/29/2008  . Degenerative disc disease, cervical   . DEGENERATIVE DISC DISEASE, CERVICAL SPINE 06/22/2007  . DYSPHAGIA UNSPECIFIED 09/25/2008  . Esophageal stricture   .  GERD 05/31/2007  . GOUT 05/31/2007  . Hepatomegaly 09/25/2008  . Hiatal hernia   . HIP PAIN 03/20/2010  . History of echocardiogram    Echo 4/17 - mild LVH, EF 50-55%, Gr 1 DD, trivial AI, MAC, mild MR  . HYPERLIPIDEMIA 03/27/2009  . HYPERTENSION 05/31/2007  . HYPOTHYROIDISM 05/31/2007  . LEFT BUNDLE BRANCH BLOCK 02/11/2009  . Liver mass, left lobe   . LOC OSTEOARTHROS NOT SPEC PRIM/SEC LOWER LEG 03/20/2010  . OSTEOARTHRITIS 05/31/2007  . PERSONAL HX COLONIC POLYPS 03/05/1997   adenomatous   . PSA, INCREASED 03/20/2010  . SINUSITIS, CHRONIC NOS 05/31/2007  . Sore throat    CURRENTLY  . SYNCOPE 11/19/2008  . WEIGHT LOSS, RECENT 11/19/2008    Past Surgical History:  Procedure Laterality Date  . carpel tunnel release    . EYE SURGERY    . INGUINAL HERNIA REPAIR  12/18/2015  . INGUINAL HERNIA REPAIR Right 12/18/2015   Procedure: OPEN REPAIR RIGHT INGUINAL HERNIA ;  Surgeon: Greer Pickerel, MD;  Location: [place];  Service: General;  Laterality: Right;  . INSERTION OF MESH Right 12/18/2015   Procedure: INSERTION OF MESH;  Surgeon: Greer Pickerel, MD;  Location: [place];  Service: General;  Laterality: Right;  . ORIF ULNAR FRACTURE  11/03/2012   Procedure: OPEN REDUCTION INTERNAL FIXATION (ORIF) ULNAR FRACTURE;  Surgeon: Hessie Dibble, MD;  Location: [place];  Service: Orthopedics;  Laterality: Right;  ORIF radius and ulnar fractures  . pul. colapse w/chest tube    . TONSILLECTOMY  Current Outpatient Prescriptions  Medication Sig Dispense Refill  . albuterol (PROVENTIL HFA;VENTOLIN HFA) 108 (90 Base) MCG/ACT inhaler Inhale 1-2 puffs into the lungs every 6 (six) hours as needed for wheezing or shortness of breath.    . ALPRAZolam (XANAX) 0.5 MG tablet Take 1 tablet (0.5 mg total) by mouth at bedtime. 30 tablet 0  . levothyroxine (SYNTHROID, LEVOTHROID) 75 MCG tablet Take 1 tablet (75 mcg total) by mouth daily before breakfast. 90 tablet 0  . pantoprazole (PROTONIX) 40 MG tablet Take 1 tablet (40 mg total)  by mouth daily. 90 tablet 4  . silodosin (RAPAFLO) 4 MG CAPS capsule Take 4 mg by mouth every evening.    . valsartan (DIOVAN) 160 MG tablet Take 1 tablet (160 mg total) by mouth daily. 90 tablet 3   No current facility-administered medications for this visit.     Allergies  Allergen Reactions  . Amoxicillin Swelling and Other (See Comments)    Pt has swelling with PCN's  . Penicillins Swelling    Lips swelling Has patient had a PCN reaction causing immediate rash, facial/tongue/throat swelling, SOB or lightheadedness with hypotension: No Has patient had a PCN reaction causing severe rash involving mucus membranes or skin necrosis: No Has patient had a PCN reaction that required hospitalization No Has patient had a PCN reaction occurring within the last 10 years: No If all of the above answers are "NO", then may proceed with Cephalosporin use.  Blair Dolphin [Cefaclor] Other (See Comments)    Pt does not remember reaction  . Celebrex [Celecoxib] Other (See Comments)    Pt does not remember reaction  . Levaquin [Levofloxacin In D5w] Other (See Comments)    Pt does not remember reaction    Social History   Social History  . Marital status: Married    Spouse name: N/A  . Number of children: N/A  . Years of education: N/A   Occupational History  . retired     Oncologist   Social History Main Topics  . Smoking status: Former Smoker    Types: Cigarettes    Quit date: 04/24/1998  . Smokeless tobacco: Never Used  . Alcohol use No     Comment: 3 mos  . Drug use: No  . Sexual activity: Not Currently   Other Topics Concern  . Not on file   Social History Narrative  . No narrative on file    Family History  Problem Relation Age of Onset  . Cancer Mother     In back but unsure of what type of cancer  . Alcohol abuse Father   . Lung disease Father     abscess  . Diabetes Sister   . Cancer Brother     throat  . Diabetes Brother   . Diabetes Sister   . Colon cancer  Neg Hx     Review of Systems:  As stated in the HPI and otherwise negative.   BP 135/65   Pulse 96   Ht 5\' 7"  (1.702 m)   Wt 134 lb 12.8 oz (61.1 kg)   BMI 21.11 kg/m   Physical Examination: General: Well developed, well nourished, NAD  HEENT: OP clear, mucus membranes moist  SKIN: warm, dry. No rashes. Neuro: No focal deficits  Musculoskeletal: Muscle strength 5/5 all ext  Psychiatric: Mood and affect normal  Neck: No JVD, no carotid bruits, no thyromegaly, no lymphadenopathy.  Lungs:Clear bilaterally, no wheezes, rhonci, crackles Cardiovascular: Regular rate and rhythm. No murmurs, gallops  or rubs. Abdomen:Soft. Bowel sounds present. Non-tender.  Extremities: No lower extremity edema. Pulses are 2 + in the bilateral DP/PT.  Echo 03/18/16: Left ventricle: Abnormal septal motion The cavity size was mildly   dilated. Wall thickness was increased in a pattern of mild LVH.   Systolic function was normal. The estimated ejection fraction was   in the range of 50% to 55%. Doppler parameters are consistent   with abnormal left ventricular relaxation (grade 1 diastolic   dysfunction). - Aortic valve: There was trivial regurgitation. - Mitral valve: Calcified annulus. Mildly thickened leaflets .   There was mild regurgitation. - Atrial septum: No defect or patent foramen ovale was identified.  EKG:  EKG is  not ordered today. The ekg ordered today demonstrates   Recent Labs: 04/20/2016: TSH 2.110 07/01/2016: Hemoglobin 13.0 07/21/2016: ALT 7; BUN 12; Creatinine, Ser 0.77; Platelets 199; Potassium 4.7; Sodium 131   Lipid Panel    Component Value Date/Time   CHOL 165 07/21/2016 1056   CHOL 177 05/01/2013 1059   TRIG 155 (H) 07/21/2016 1056   TRIG 196 (H) 05/01/2013 1059   HDL 54 07/21/2016 1056   HDL 45 05/01/2013 1059   CHOLHDL 3.1 07/21/2016 1056   CHOLHDL 4 09/22/2012 1051   VLDL 35.2 09/22/2012 1051   LDLCALC 80 07/21/2016 1056   LDLCALC 93 05/01/2013 1059   LDLDIRECT  67.7 01/12/2012 0823     Wt Readings from Last 3 Encounters:  07/29/16 134 lb 12.8 oz (61.1 kg)  07/21/16 136 lb 12.8 oz (62.1 kg)  07/01/16 136 lb (61.7 kg)     Other studies Reviewed: Additional studies/ records that were reviewed today include: . Review of the above records demonstrates:    Assessment and Plan:   1. CAD His CAD has been stable. He had a stress myoview in 2009 that showed anterior wall ischemic and was found by cath to have diffuse, calcific moderately severe stenosis in the moderate caliber left anterior descending artery. The vessel was borderline in size for PCI and we elected at that time for medical management. He has done well with medical management of his CAD. His ASA, Imdur and statin have been stopped in primary care due to possible GI side effects. No beta blocker due to bradycardia.   2. HTN: BP controlled. Continue Diovan.   3. LBBB: Chronic.   Current medicines are reviewed at length with the patient today.  The patient does not have concerns regarding medicines.  The following changes have been made:  no change  Labs/ tests ordered today include:  No orders of the defined types were placed in this encounter.   Disposition:   FU with me in 6 months  Signed, Lauree Chandler, MD 07/29/2016 10:57 AM    [facility], [address], [place]  [address] Phone: [phone]; Fax: [phone]

## 2016-07-29 NOTE — Patient Instructions (Signed)

## 2016-08-04 ENCOUNTER — Ambulatory Visit (INDEPENDENT_AMBULATORY_CARE_PROVIDER_SITE_OTHER): Payer: Medicare Other | Admitting: Family Medicine

## 2016-08-04 ENCOUNTER — Encounter: Payer: Self-pay | Admitting: Family Medicine

## 2016-08-04 VITALS — BP 127/58 | HR 101 | Temp 97.4°F | Ht 67.0 in | Wt 135.0 lb

## 2016-08-04 DIAGNOSIS — E785 Hyperlipidemia, unspecified: Secondary | ICD-10-CM

## 2016-08-04 DIAGNOSIS — G4700 Insomnia, unspecified: Secondary | ICD-10-CM | POA: Diagnosis not present

## 2016-08-04 DIAGNOSIS — E039 Hypothyroidism, unspecified: Secondary | ICD-10-CM

## 2016-08-04 DIAGNOSIS — I10 Essential (primary) hypertension: Secondary | ICD-10-CM | POA: Diagnosis not present

## 2016-08-04 MED ORDER — TRAZODONE HCL 150 MG PO TABS: ORAL_TABLET | ORAL | 5 refills | Status: DC

## 2016-08-04 NOTE — Progress Notes (Signed)
Subjective:  Patient ID: Albert Allen, male    DOB: 09-03-1923  Age: 82 y.o. MRN: [id number]  CC: 2 week recheck (since his last visit he hasn't been having any nausea and now appetitie has increased)   HPI Albert Allen presents for fell yesterday, tripped over telephone cord. Went about routine. No pain.   Went on partial drug holiday after last visit.No longer nauseated. Appetite is back. Feels more energy. Sleeping better, but awakening several times overnight. Sx went away within a cuple of days. Awoke a little jittery this AM so he took .25 xanax. Taking .5 qhs to help with sleep. Worried about his wife who has dementia  And upcoming surgery for gher renal carcinoma. Pt. Denies use of alcohol recently.    History Albert Allen has a past medical history of ABNORMAL CV (STRESS) TEST (03/05/2009); ABUSE, ALCOHOL, IN REMISSION (05/31/2007); Adhesive capsulitis of shoulder (12/29/2007); Alcoholic polyneuropathy ([place]) (03/29/2008); ANEMIA, B12 DEFICIENCY (03/29/2008); Anxiety state, unspecified (11/19/2008); ASTHMA (05/31/2007); CAD, NATIVE VESSEL (04/04/2009); CARPAL TUNNEL SYNDROME, RIGHT (09/05/2007); Chronic prostatitis (03/29/2008); Degenerative disc disease, cervical; DEGENERATIVE DISC DISEASE, CERVICAL SPINE (06/22/2007); DYSPHAGIA UNSPECIFIED (09/25/2008); Esophageal stricture; GERD (05/31/2007); GOUT (05/31/2007); Hepatomegaly (09/25/2008); Hiatal hernia; HIP PAIN (03/20/2010); History of echocardiogram; HYPERLIPIDEMIA (03/27/2009); HYPERTENSION (05/31/2007); HYPOTHYROIDISM (05/31/2007); LEFT BUNDLE BRANCH BLOCK (02/11/2009); Liver mass, left lobe; LOC OSTEOARTHROS NOT SPEC PRIM/SEC LOWER LEG (03/20/2010); OSTEOARTHRITIS (05/31/2007); PERSONAL HX COLONIC POLYPS (03/05/1997); PSA, INCREASED (03/20/2010); SINUSITIS, CHRONIC NOS (05/31/2007); Sore throat; SYNCOPE (11/19/2008); and WEIGHT LOSS, RECENT (11/19/2008).   He has a past surgical history that includes pul. colapse w/chest tube; Tonsillectomy; carpel tunnel  release; ORIF ulnar fracture (11/03/2012); Eye surgery; Inguinal hernia repair (12/18/2015); Inguinal hernia repair (Right, 12/18/2015); and Insertion of mesh (Right, 12/18/2015).   His family history includes Alcohol abuse in his father; Cancer in his brother and mother; Diabetes in his brother, sister, and sister; Lung disease in his father.He reports that he quit smoking about 18 years ago. His smoking use included Cigarettes. He has never used smokeless tobacco. He reports that he does not drink alcohol or use drugs.    ROS Review of Systems  Constitutional: Negative for chills, diaphoresis, fever and unexpected weight change.  HENT: Negative for congestion, hearing loss, rhinorrhea and sore throat.   Eyes: Negative for visual disturbance.  Respiratory: Negative for cough and shortness of breath.   Cardiovascular: Negative for chest pain.  Gastrointestinal: Negative for abdominal pain, constipation and diarrhea.  Genitourinary: Negative for dysuria and flank pain.  Musculoskeletal: Positive for arthralgias (knees) and back pain. Negative for joint swelling.  Skin: Negative for rash.  Neurological: Negative for dizziness and headaches.  Psychiatric/Behavioral: Positive for sleep disturbance. Negative for behavioral problems, decreased concentration, dysphoric mood and suicidal ideas. The patient is nervous/anxious.     Objective:  BP (!) 127/58   Pulse (!) 101   Temp 97.4 F (36.3 C) (Oral)   Ht 5\' 7"  (1.702 m)   Wt 135 lb (61.2 kg)   BMI 21.14 kg/m   BP Readings from Last 3 Encounters:  08/04/16 (!) 127/58  07/29/16 135/65  07/21/16 140/63    Wt Readings from Last 3 Encounters:  08/04/16 135 lb (61.2 kg)  07/29/16 134 lb 12.8 oz (61.1 kg)  07/21/16 136 lb 12.8 oz (62.1 kg)     Physical Exam  Constitutional: He is oriented to person, place, and time. He appears well-developed and well-nourished. No distress.  HENT:  Head: Normocephalic and atraumatic.  Right Ear:  External ear normal.  Left Ear: External ear normal.  Nose: Nose normal.  Mouth/Throat: Oropharynx is clear and moist.  Eyes: Conjunctivae and EOM are normal. Pupils are equal, round, and reactive to light.  Neck: Normal range of motion. Neck supple. No thyromegaly present.  Cardiovascular: Normal rate, regular rhythm and normal heart sounds.   No murmur heard. Pulmonary/Chest: Effort normal and breath sounds normal. No respiratory distress. He has no wheezes. He has no rales.  Abdominal: Soft. There is no tenderness.  Lymphadenopathy:    He has no cervical adenopathy.  Neurological: He is alert and oriented to person, place, and time. He has normal reflexes.  Skin: Skin is warm and dry.  Psychiatric: He has a normal mood and affect. His behavior is normal. Judgment and thought content normal.     Lab Results  Component Value Date   WBC 2.8 (L) 07/21/2016   HGB 13.0 07/01/2016   HCT 36.3 (L) 07/21/2016   PLT 199 07/21/2016   GLUCOSE 85 07/21/2016   CHOL 165 07/21/2016   TRIG 155 (H) 07/21/2016   HDL 54 07/21/2016   LDLDIRECT 67.7 01/12/2012   LDLCALC 80 07/21/2016   ALT 7 07/21/2016   AST 12 07/21/2016   NA 131 (L) 07/21/2016   K 4.7 07/21/2016   CL 91 (L) 07/21/2016   CREATININE 0.77 07/21/2016   BUN 12 07/21/2016   CO2 26 07/21/2016   TSH 2.110 04/20/2016   PSA 1.42 01/12/2012   INR 0.97 11/03/2012   HGBA1C 6.1% 01/22/2015    Ct Abdomen Pelvis W Contrast  Result Date: 07/21/2016 CLINICAL DATA:  Nausea, lower abdominal pain.  Diverticulosis. EXAM: CT ABDOMEN AND PELVIS WITH CONTRAST TECHNIQUE: Multidetector CT imaging of the abdomen and pelvis was performed using the standard protocol following bolus administration of intravenous contrast. CONTRAST:  120mL ISOVUE-300 IOPAMIDOL (ISOVUE-300) INJECTION 61% COMPARISON:  CT 06/19/2014 FINDINGS: Lower chest: Air fill cysts noted anteriorly within the right middle lobe, stable. No confluent opacities or effusions. Heart is  normal size. Small hiatal hernia. Hepatobiliary: Stable hemangiomas in the left hepatic dome. Peripheral puddling of contrast in these hemangiomas. No other focal or new hepatic abnormality. Gallbladder unremarkable. No biliary ductal dilatation. Pancreas: No focal abnormality or ductal dilatation. Spleen: No focal abnormality.  Normal size. Adrenals/Urinary Tract: No adrenal abnormality. No focal renal abnormality. No stones or hydronephrosis. Urinary bladder is unremarkable. Stomach/Bowel: Appendix is normal. Stomach, large and small bowel grossly unremarkable. Vascular/Lymphatic: Diffuse aortic and iliac calcifications. No aneurysm. No adenopathy. Reproductive: Mildly prominent prostate. Other: Small bilateral inguinal hernias containing fat No free fluid or free air. Musculoskeletal: No acute bony abnormality or focal bone lesion. Degenerative changes in the lower lumbar spine. IMPRESSION: No acute findings in the abdomen or pelvis. Electronically Signed   By: Rolm Baptise M.D.   On: 07/21/2016 16:12    Assessment & Plan:   Phelan was seen today for 2 week recheck.  Diagnoses and all orders for this visit:  Hypothyroidism, unspecified hypothyroidism type  Hyperlipidemia  Essential hypertension  Insomnia -     traZODone (DESYREL) 150 MG tablet; Use from 1/3 to 1 tablet nightly as needed for sleep.    Nausea from polypharmacy. Will continue to limit meds for now and monitor his progress.We discussed longevity vs. Quality of life. He agrees to hold off on statin based on this conversation.  I am having Mr. Seawell start on traZODone. I am also having him maintain his pantoprazole, valsartan, levothyroxine, silodosin, ALPRAZolam, and albuterol.  Meds ordered this  encounter  Medications  . traZODone (DESYREL) 150 MG tablet    Sig: Use from 1/3 to 1 tablet nightly as needed for sleep.    Dispense:  30 tablet    Refill:  5     Follow-up: Return in about 3 months (around  11/03/2016).  [name], M.D.

## 2016-08-19 ENCOUNTER — Other Ambulatory Visit: Payer: Self-pay | Admitting: Family Medicine

## 2016-08-19 NOTE — Telephone Encounter (Signed)
Patient last seen in office on 08-04-16. Rx last filled on 07-22-16. Please advise and route to pool B so nurse can phone in to pharmacy

## 2016-08-24 ENCOUNTER — Encounter: Payer: Self-pay | Admitting: Family Medicine

## 2016-08-24 ENCOUNTER — Ambulatory Visit (INDEPENDENT_AMBULATORY_CARE_PROVIDER_SITE_OTHER): Payer: Medicare Other | Admitting: Family Medicine

## 2016-08-24 VITALS — BP 125/64 | HR 82 | Temp 97.0°F | Ht 67.0 in | Wt 131.4 lb

## 2016-08-24 DIAGNOSIS — J209 Acute bronchitis, unspecified: Secondary | ICD-10-CM | POA: Diagnosis not present

## 2016-08-24 MED ORDER — AZITHROMYCIN 250 MG PO TABS: ORAL_TABLET | ORAL | 0 refills | Status: DC

## 2016-08-24 NOTE — Patient Instructions (Addendum)
Great to see you !  Start the azithromycin today, continue mucinex and albuterol.   Please come back if things get worse or are not improving as expected.    Acute Bronchitis Bronchitis is when the airways that extend from the windpipe into the lungs get red, puffy, and painful (inflamed). Bronchitis often causes thick spit (mucus) to develop. This leads to a cough. A cough is the most common symptom of bronchitis. In acute bronchitis, the condition usually begins suddenly and goes away over time (usually in 2 weeks). Smoking, allergies, and asthma can make bronchitis worse. Repeated episodes of bronchitis may cause more lung problems. HOME CARE  Rest.  Drink enough fluids to keep your pee (urine) clear or pale yellow (unless you need to limit fluids as told by your doctor).  Only take over-the-counter or prescription medicines as told by your doctor.  Avoid smoking and secondhand smoke. These can make bronchitis worse. If you are a smoker, think about using nicotine gum or skin patches. Quitting smoking will help your lungs heal faster.  Reduce the chance of getting bronchitis again by:  Washing your hands often.  Avoiding people with cold symptoms.  Trying not to touch your hands to your mouth, nose, or eyes.  Follow up with your doctor as told. GET HELP IF: Your symptoms do not improve after 1 week of treatment. Symptoms include:  Cough.  Fever.  Coughing up thick spit.  Body aches.  Chest congestion.  Chills.  Shortness of breath.  Sore throat. GET HELP RIGHT AWAY IF:   You have an increased fever.  You have chills.  You have severe shortness of breath.  You have bloody thick spit (sputum).  You throw up (vomit) often.  You lose too much body fluid (dehydration).  You have a severe headache.  You faint. MAKE SURE YOU:   Understand these instructions.  Will watch your condition.  Will get help right away if you are not doing well or get  worse.   This information is not intended to replace advice given to you by your health care provider. Make sure you discuss any questions you have with your health care provider.   Document Released: 05/04/2008 Document Revised: 07/19/2013 Document Reviewed: 05/09/2013 Elsevier Interactive Patient Education [employer].

## 2016-08-24 NOTE — Progress Notes (Signed)
   HPI  Patient presents today here with cough and cold some's.  Patient explains he said cough, nasal congestion, frequent throat clearing, and shortness of breath over the last 3 days. He's been spending a lot of time in hospital with his wife who just had a nephrectomy.  He states that he has a history of "a touch of COPD", he's been using albuterol with some improvement.  He endorses malaise and decreased appetite. He is tolerating food and fluids normally.  He states that mucinex is helping  PMH: Smoking status noted ROS: Per HPI  Objective: BP 125/64 (BP Location: Right Arm, Patient Position: Sitting, Cuff Size: Normal)   Pulse 82   Temp 97 F (36.1 C) (Oral)   Ht 5\' 7"  (1.702 m)   Wt 131 lb 6.4 oz (59.6 kg)   BMI 20.58 kg/m  Gen: NAD, alert, cooperative with exam HEENT: NCAT, EOMI, PERRL CV: RRR, good S1/S2, no murmur Resp: CTABL, no wheezes, non-labored Ext: No edema, warm Neuro: Alert and oriented, No gross deficits  Assessment and plan:  # Acute bronchitis Albuterol responsive cough, shortness of breath, and increased healthcare exposure lately Cover with azithromycin given age and comorbidities Continue Mucinex and albuterol. Return to clinic with worsening symptoms or failure to improve.    Meds ordered this encounter  Medications  . azithromycin (ZITHROMAX) 250 MG tablet    Sig: Take 2 tablets on day 1 and 1 tablet daily after that    Dispense:  6 tablet    Refill:  0    Laroy Apple, MD [facility] 08/24/2016, 11:23 AM

## 2016-09-07 ENCOUNTER — Encounter (INDEPENDENT_AMBULATORY_CARE_PROVIDER_SITE_OTHER): Payer: Medicare Other | Admitting: Family Medicine

## 2016-09-07 DIAGNOSIS — Z23 Encounter for immunization: Secondary | ICD-10-CM | POA: Diagnosis not present

## 2016-09-10 ENCOUNTER — Other Ambulatory Visit: Payer: Self-pay | Admitting: Family Medicine

## 2016-09-10 DIAGNOSIS — K2100 Gastro-esophageal reflux disease with esophagitis, without bleeding: Secondary | ICD-10-CM

## 2016-09-16 ENCOUNTER — Other Ambulatory Visit: Payer: Self-pay | Admitting: Family Medicine

## 2016-09-19 ENCOUNTER — Telehealth: Payer: Self-pay | Admitting: Family Medicine

## 2016-09-21 ENCOUNTER — Telehealth: Payer: Self-pay | Admitting: Family Medicine

## 2016-09-21 MED ORDER — ALPRAZOLAM 0.5 MG PO TABS: 0.5000 mg | ORAL_TABLET | Freq: Every day | ORAL | 5 refills | Status: DC

## 2016-09-21 NOTE — Telephone Encounter (Signed)
TC to pt & pharmacy for refill on Xanax

## 2016-09-21 NOTE — Telephone Encounter (Signed)
Okay at current level for 6 mos. Thanks ws 

## 2016-09-25 ENCOUNTER — Other Ambulatory Visit: Payer: Self-pay | Admitting: Family Medicine

## 2016-09-25 DIAGNOSIS — J206 Acute bronchitis due to rhinovirus: Secondary | ICD-10-CM

## 2016-09-25 NOTE — Progress Notes (Deleted)
Error

## 2016-09-28 DIAGNOSIS — N401 Enlarged prostate with lower urinary tract symptoms: Secondary | ICD-10-CM | POA: Diagnosis not present

## 2016-09-28 DIAGNOSIS — R338 Other retention of urine: Secondary | ICD-10-CM | POA: Diagnosis not present

## 2016-10-05 ENCOUNTER — Ambulatory Visit (INDEPENDENT_AMBULATORY_CARE_PROVIDER_SITE_OTHER): Payer: Medicare Other | Admitting: Family Medicine

## 2016-10-05 ENCOUNTER — Encounter: Payer: Self-pay | Admitting: Family Medicine

## 2016-10-05 VITALS — BP 135/62 | HR 80 | Temp 97.1°F | Ht 67.0 in | Wt 136.4 lb

## 2016-10-05 DIAGNOSIS — I10 Essential (primary) hypertension: Secondary | ICD-10-CM | POA: Diagnosis not present

## 2016-10-05 DIAGNOSIS — G4700 Insomnia, unspecified: Secondary | ICD-10-CM | POA: Diagnosis not present

## 2016-10-05 DIAGNOSIS — K2100 Gastro-esophageal reflux disease with esophagitis, without bleeding: Secondary | ICD-10-CM

## 2016-10-05 DIAGNOSIS — E782 Mixed hyperlipidemia: Secondary | ICD-10-CM | POA: Diagnosis not present

## 2016-10-05 MED ORDER — VALSARTAN 160 MG PO TABS: 160.0000 mg | ORAL_TABLET | Freq: Every day | ORAL | 3 refills | Status: DC

## 2016-10-05 MED ORDER — PANTOPRAZOLE SODIUM 40 MG PO TBEC: 40.0000 mg | DELAYED_RELEASE_TABLET | Freq: Every day | ORAL | 3 refills | Status: DC

## 2016-10-05 MED ORDER — TRAZODONE HCL 150 MG PO TABS: ORAL_TABLET | ORAL | 3 refills | Status: DC

## 2016-10-05 NOTE — Progress Notes (Signed)
Subjective:  Patient ID: Albert Allen, male    DOB: 04-Dec-1922  Age: 82 y.o. MRN: [id number]  CC: Hypothyroidism (3 mos ckup)   HPI Albert Allen presents for Patient presents for follow-up on  thyroid. The patient has a history of hypothyroidism. It has been stable recently. Pt. denies any change in  voice, loss of hair, heat or cold intolerance. Energy level has been adequate to good. Patient denies constipation and diarrhea. No myxedema. Medication is as noted below. Verified that pt is taking it daily on an empty stomach. Well tolerated. Patient in for follow-up of elevated cholesterol. Doing well without complaints on current medication. Denies side effects of statin including myalgia and arthralgia and nausea. Also in today for liver function testing. Currently no chest pain, shortness of breath or other cardiovascular related symptoms noted. follow-up of hypertension. Patient has no history of headache chest pain or shortness of breath or recent cough. Patient also denies symptoms of TIA such as numbness weakness lateralizing. Patient checks  blood pressure at home and has not had any elevated readings recently. Patient denies side effects from his medication. States taking it regularly. Patient in for follow-up of GERD. Currently asymptomatic taking  PPI daily. There is no chest pain or heartburn. No hematemesis and no melena. No dysphagia or choking. Onset is remote. Progression is stable. Complicating factors, none. Continues to have sleeping issues. Poor tolerance of medication  History Albert Allen has a past medical history of ABNORMAL CV (STRESS) TEST (03/05/2009); ABUSE, ALCOHOL, IN REMISSION (05/31/2007); Adhesive capsulitis of shoulder (12/29/2007); Alcoholic polyneuropathy ([facility]) (03/29/2008); ANEMIA, B12 DEFICIENCY (03/29/2008); Anxiety state, unspecified (11/19/2008); ASTHMA (05/31/2007); CAD, NATIVE VESSEL (04/04/2009); CARPAL TUNNEL SYNDROME, RIGHT (09/05/2007); Chronic prostatitis  (03/29/2008); Degenerative disc disease, cervical; DEGENERATIVE DISC DISEASE, CERVICAL SPINE (06/22/2007); DYSPHAGIA UNSPECIFIED (09/25/2008); Esophageal stricture; GERD (05/31/2007); GOUT (05/31/2007); Hepatomegaly (09/25/2008); Hiatal hernia; HIP PAIN (03/20/2010); History of echocardiogram; HYPERLIPIDEMIA (03/27/2009); HYPERTENSION (05/31/2007); HYPOTHYROIDISM (05/31/2007); LEFT BUNDLE BRANCH BLOCK (02/11/2009); Liver mass, left lobe; LOC OSTEOARTHROS NOT SPEC PRIM/SEC LOWER LEG (03/20/2010); OSTEOARTHRITIS (05/31/2007); PERSONAL HX COLONIC POLYPS (03/05/1997); PSA, INCREASED (03/20/2010); SINUSITIS, CHRONIC NOS (05/31/2007); Sore throat; SYNCOPE (11/19/2008); and WEIGHT LOSS, RECENT (11/19/2008).   He has a past surgical history that includes pul. colapse w/chest tube; Tonsillectomy; carpel tunnel release; ORIF ulnar fracture (11/03/2012); Eye surgery; Inguinal hernia repair (12/18/2015); Inguinal hernia repair (Right, 12/18/2015); and Insertion of mesh (Right, 12/18/2015).   His family history includes Alcohol abuse in his father; Cancer in his brother and mother; Diabetes in his brother, sister, and sister; Lung disease in his father.He reports that he quit smoking about 18 years ago. His smoking use included Cigarettes. He has never used smokeless tobacco. He reports that he does not drink alcohol or use drugs.    ROS Review of Systems  Constitutional: Negative for chills, diaphoresis and fever.  HENT: Negative for rhinorrhea and sore throat.   Respiratory: Negative for cough and shortness of breath.   Cardiovascular: Negative for chest pain.  Gastrointestinal: Negative for abdominal pain.  Musculoskeletal: Negative for arthralgias and myalgias.  Skin: Negative for rash.  Neurological: Negative for weakness and headaches.    Objective:  BP 135/62   Pulse 80   Temp 97.1 F (36.2 C) (Oral)   Ht 5\' 7"  (1.702 m)   Wt 136 lb 6.4 oz (61.9 kg)   BMI 21.36 kg/m   BP Readings from Last 3 Encounters:  10/05/16  135/62  08/24/16 125/64  08/04/16 (!) 127/58    Wt Readings from  Last 3 Encounters:  10/05/16 136 lb 6.4 oz (61.9 kg)  08/24/16 131 lb 6.4 oz (59.6 kg)  08/04/16 135 lb (61.2 kg)     Physical Exam  Constitutional: He is oriented to person, place, and time. He appears well-developed and well-nourished. No distress.  HENT:  Head: Normocephalic and atraumatic.  Right Ear: External ear normal.  Left Ear: External ear normal.  Nose: Nose normal.  Mouth/Throat: Oropharynx is clear and moist.  Eyes: Conjunctivae and EOM are normal. Pupils are equal, round, and reactive to light.  Neck: Normal range of motion. Neck supple. No thyromegaly present.  Cardiovascular: Normal rate, regular rhythm and normal heart sounds.   No murmur heard. Pulmonary/Chest: Effort normal and breath sounds normal. No respiratory distress. He has no wheezes. He has no rales.  Abdominal: Soft. Bowel sounds are normal. He exhibits no distension. There is no tenderness.  Lymphadenopathy:    He has no cervical adenopathy.  Neurological: He is alert and oriented to person, place, and time. He has normal reflexes.  Skin: Skin is warm and dry.  Psychiatric: He has a normal mood and affect. His behavior is normal. Judgment and thought content normal.     Lab Results  Component Value Date   WBC 2.8 (L) 07/21/2016   HGB 13.0 07/01/2016   HCT 36.3 (L) 07/21/2016   PLT 199 07/21/2016   GLUCOSE 85 07/21/2016   CHOL 165 07/21/2016   TRIG 155 (H) 07/21/2016   HDL 54 07/21/2016   LDLDIRECT 67.7 01/12/2012   LDLCALC 80 07/21/2016   ALT 7 07/21/2016   AST 12 07/21/2016   NA 131 (L) 07/21/2016   K 4.7 07/21/2016   CL 91 (L) 07/21/2016   CREATININE 0.77 07/21/2016   BUN 12 07/21/2016   CO2 26 07/21/2016   TSH 2.110 04/20/2016   PSA 1.42 01/12/2012   INR 0.97 11/03/2012   HGBA1C 6.1% 01/22/2015    Ct Abdomen Pelvis W Contrast  Result Date: 07/21/2016 CLINICAL DATA:  Nausea, lower abdominal pain.   Diverticulosis. EXAM: CT ABDOMEN AND PELVIS WITH CONTRAST TECHNIQUE: Multidetector CT imaging of the abdomen and pelvis was performed using the standard protocol following bolus administration of intravenous contrast. CONTRAST:  191mL ISOVUE-300 IOPAMIDOL (ISOVUE-300) INJECTION 61% COMPARISON:  CT 06/19/2014 FINDINGS: Lower chest: Air fill cysts noted anteriorly within the right middle lobe, stable. No confluent opacities or effusions. Heart is normal size. Small hiatal hernia. Hepatobiliary: Stable hemangiomas in the left hepatic dome. Peripheral puddling of contrast in these hemangiomas. No other focal or new hepatic abnormality. Gallbladder unremarkable. No biliary ductal dilatation. Pancreas: No focal abnormality or ductal dilatation. Spleen: No focal abnormality.  Normal size. Adrenals/Urinary Tract: No adrenal abnormality. No focal renal abnormality. No stones or hydronephrosis. Urinary bladder is unremarkable. Stomach/Bowel: Appendix is normal. Stomach, large and small bowel grossly unremarkable. Vascular/Lymphatic: Diffuse aortic and iliac calcifications. No aneurysm. No adenopathy. Reproductive: Mildly prominent prostate. Other: Small bilateral inguinal hernias containing fat No free fluid or free air. Musculoskeletal: No acute bony abnormality or focal bone lesion. Degenerative changes in the lower lumbar spine. IMPRESSION: No acute findings in the abdomen or pelvis. Electronically Signed   By: Rolm Baptise M.D.   On: 07/21/2016 16:12    Assessment & Plan:   Jahir was seen today for hypothyroidism.  Diagnoses and all orders for this visit:  Mixed hyperlipidemia  Essential hypertension -     valsartan (DIOVAN) 160 MG tablet; Take 1 tablet (160 mg total) by mouth daily.  Gastroesophageal reflux disease with esophagitis -     pantoprazole (PROTONIX) 40 MG tablet; Take 1 tablet (40 mg total) by mouth daily.  Insomnia, unspecified type -     traZODone (DESYREL) 150 MG tablet; Use from 1/3 to  1 tablet nightly as needed for sleep.      I have changed Mr. Mckinnies pantoprazole. I am also having him maintain his silodosin, ALPRAZolam, levothyroxine, traZODone, and valsartan.  Meds ordered this encounter  Medications  . pantoprazole (PROTONIX) 40 MG tablet    Sig: Take 1 tablet (40 mg total) by mouth daily.    Dispense:  90 tablet    Refill:  3  . traZODone (DESYREL) 150 MG tablet    Sig: Use from 1/3 to 1 tablet nightly as needed for sleep.    Dispense:  90 tablet    Refill:  3  . valsartan (DIOVAN) 160 MG tablet    Sig: Take 1 tablet (160 mg total) by mouth daily.    Dispense:  90 tablet    Refill:  3     Follow-up: Return in about 3 months (around 01/05/2017).  [name], M.D.

## 2016-10-06 ENCOUNTER — Other Ambulatory Visit: Payer: Self-pay | Admitting: *Deleted

## 2016-10-06 MED ORDER — ALBUTEROL SULFATE HFA 108 (90 BASE) MCG/ACT IN AERS: 1.0000 | INHALATION_SPRAY | Freq: Four times a day (QID) | RESPIRATORY_TRACT | 3 refills | Status: DC | PRN

## 2016-10-06 NOTE — Progress Notes (Signed)
Pt requesting refill of Albuterol inhaler Refill sent in per pt request Per protocol

## 2016-11-06 DIAGNOSIS — H4323 Crystalline deposits in vitreous body, bilateral: Secondary | ICD-10-CM | POA: Diagnosis not present

## 2016-11-06 DIAGNOSIS — H43393 Other vitreous opacities, bilateral: Secondary | ICD-10-CM | POA: Diagnosis not present

## 2016-11-06 DIAGNOSIS — H04123 Dry eye syndrome of bilateral lacrimal glands: Secondary | ICD-10-CM | POA: Diagnosis not present

## 2016-11-06 DIAGNOSIS — H532 Diplopia: Secondary | ICD-10-CM | POA: Diagnosis not present

## 2017-01-05 ENCOUNTER — Ambulatory Visit (INDEPENDENT_AMBULATORY_CARE_PROVIDER_SITE_OTHER): Payer: Medicare Other | Admitting: Family Medicine

## 2017-01-05 ENCOUNTER — Encounter: Payer: Self-pay | Admitting: Family Medicine

## 2017-01-05 VITALS — BP 159/68 | HR 69 | Ht 67.0 in | Wt 139.0 lb

## 2017-01-05 DIAGNOSIS — E039 Hypothyroidism, unspecified: Secondary | ICD-10-CM

## 2017-01-05 DIAGNOSIS — J45909 Unspecified asthma, uncomplicated: Secondary | ICD-10-CM | POA: Diagnosis not present

## 2017-01-05 DIAGNOSIS — M1991 Primary osteoarthritis, unspecified site: Secondary | ICD-10-CM | POA: Diagnosis not present

## 2017-01-05 DIAGNOSIS — I2510 Atherosclerotic heart disease of native coronary artery without angina pectoris: Secondary | ICD-10-CM

## 2017-01-05 DIAGNOSIS — R350 Frequency of micturition: Secondary | ICD-10-CM | POA: Diagnosis not present

## 2017-01-05 DIAGNOSIS — E782 Mixed hyperlipidemia: Secondary | ICD-10-CM

## 2017-01-05 DIAGNOSIS — D518 Other vitamin B12 deficiency anemias: Secondary | ICD-10-CM

## 2017-01-05 DIAGNOSIS — N401 Enlarged prostate with lower urinary tract symptoms: Secondary | ICD-10-CM | POA: Diagnosis not present

## 2017-01-05 MED ORDER — ALPRAZOLAM 0.5 MG PO TABS: 0.5000 mg | ORAL_TABLET | Freq: Every day | ORAL | 5 refills | Status: DC

## 2017-01-05 MED ORDER — DICLOFENAC SODIUM 75 MG PO TBEC: 75.0000 mg | DELAYED_RELEASE_TABLET | Freq: Two times a day (BID) | ORAL | 2 refills | Status: DC

## 2017-01-05 MED ORDER — DULOXETINE HCL 20 MG PO CPEP: 20.0000 mg | ORAL_CAPSULE | Freq: Every day | ORAL | 3 refills | Status: DC

## 2017-01-05 NOTE — Progress Notes (Signed)
Subjective:  Patient ID: Albert Allen, male    DOB: Feb 14, 1923  Age: 83 y.o. MRN: [id number]  CC: Hyperlipidemia (pt here today for routine follow up on his cholesterol)   HPI GEORGIO Allen presents for  follow-up of hypertension. Patient has no history of headache chest pain or shortness of breath or recent cough. Patient also denies symptoms of TIA such as numbness weakness lateralizing. Patient checks  blood pressure at home and has not had any elevated readings recently. Patient denies side effects from his medication. States taking it regularly.  Patient also  in for follow-up of elevated cholesterol. Doing well without complaints on current medication. Denies side effects of statin including myalgia and arthralgia and nausea. Also in today for liver function testing. Currently no chest pain, shortness of breath or other cardiovascular related symptoms noted. Back pain in the AM. Also would like back brace. Abd sx resolved.  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 Incontinence of stool. Very anxious about taking care of her.  States that trazodone caused his legs to cramp at night. History Winslow has a past medical history of ABNORMAL CV (STRESS) TEST (03/05/2009); ABUSE, ALCOHOL, IN REMISSION (05/31/2007); Adhesive capsulitis of shoulder (12/29/2007); Alcoholic polyneuropathy ([place]) (03/29/2008); ANEMIA, B12 DEFICIENCY (03/29/2008); Anxiety state, unspecified (11/19/2008); ASTHMA (05/31/2007); CAD, NATIVE VESSEL (04/04/2009); CARPAL TUNNEL SYNDROME, RIGHT (09/05/2007); Chronic prostatitis (03/29/2008); Degenerative disc disease, cervical; DEGENERATIVE DISC DISEASE, CERVICAL SPINE (06/22/2007); DYSPHAGIA UNSPECIFIED (09/25/2008); Esophageal stricture; GERD (05/31/2007); GOUT (05/31/2007); Hepatomegaly (09/25/2008); Hiatal hernia; HIP PAIN (03/20/2010); History of echocardiogram; HYPERLIPIDEMIA (03/27/2009); HYPERTENSION (05/31/2007); HYPOTHYROIDISM (05/31/2007); LEFT BUNDLE BRANCH BLOCK  (02/11/2009); Liver mass, left lobe; LOC OSTEOARTHROS NOT SPEC PRIM/SEC LOWER LEG (03/20/2010); OSTEOARTHRITIS (05/31/2007); PERSONAL HX COLONIC POLYPS (03/05/1997); PSA, INCREASED (03/20/2010); SINUSITIS, CHRONIC NOS (05/31/2007); Sore throat; SYNCOPE (11/19/2008); and WEIGHT LOSS, RECENT (11/19/2008).   He has a past surgical history that includes pul. colapse w/chest tube; Tonsillectomy; carpel tunnel release; ORIF ulnar fracture (11/03/2012); Eye surgery; Inguinal hernia repair (12/18/2015); Inguinal hernia repair (Right, 12/18/2015); and Insertion of mesh (Right, 12/18/2015).   His family history includes Alcohol abuse in his father; Cancer in his brother and mother; Diabetes in his brother, sister, and sister; Lung disease in his father.He reports that he quit smoking about 18 years ago. His smoking use included Cigarettes. He has never used smokeless tobacco. He reports that he does not drink alcohol or use drugs.  Current Outpatient Prescriptions on File Prior to Visit  Medication Sig Dispense Refill  . albuterol (PROVENTIL HFA;VENTOLIN HFA) 108 (90 Base) MCG/ACT inhaler Inhale 1-2 puffs into the lungs every 6 (six) hours as needed for wheezing or shortness of breath. 4 Inhaler 3  . levothyroxine (SYNTHROID, LEVOTHROID) 75 MCG tablet Take 1 tablet (75 mcg total) by mouth daily before breakfast. 90 tablet 1  . pantoprazole (PROTONIX) 40 MG tablet Take 1 tablet (40 mg total) by mouth daily. 90 tablet 3  . silodosin (RAPAFLO) 4 MG CAPS capsule Take 4 mg by mouth every evening.    . valsartan (DIOVAN) 160 MG tablet Take 1 tablet (160 mg total) by mouth daily. 90 tablet 3   No current facility-administered medications on file prior to visit.     ROS Review of Systems  Constitutional: Negative for chills, diaphoresis, fever and unexpected weight change.  HENT: Negative for congestion, hearing loss, rhinorrhea and sore throat.   Eyes: Negative for visual disturbance.  Respiratory: Negative for cough and  shortness of breath.   Cardiovascular: Negative for chest  pain.  Gastrointestinal: Negative for abdominal pain, constipation and diarrhea.  Genitourinary: Negative for dysuria and flank pain.  Musculoskeletal: Positive for back pain. Negative for arthralgias and joint swelling.  Skin: Negative for rash.  Neurological: Negative for dizziness and headaches.  Psychiatric/Behavioral: Positive for agitation and sleep disturbance. Negative for dysphoric mood. The patient is nervous/anxious.     Objective:  BP (!) 168/68   Pulse 69   Ht '5\' 7"'$  (1.702 m)   Wt 139 lb (63 kg)   BMI 21.77 kg/m   BP Readings from Last 3 Encounters:  01/05/17 (!) 168/68  10/05/16 135/62  08/24/16 125/64    Wt Readings from Last 3 Encounters:  01/05/17 139 lb (63 kg)  10/05/16 136 lb 6.4 oz (61.9 kg)  08/24/16 131 lb 6.4 oz (59.6 kg)     Physical Exam  Constitutional: He is oriented to person, place, and time. He appears well-developed and well-nourished. No distress.  HENT:  Head: Normocephalic and atraumatic.  Right Ear: External ear normal.  Left Ear: External ear normal.  Nose: Nose normal.  Mouth/Throat: Oropharynx is clear and moist.  Eyes: Conjunctivae and EOM are normal. Pupils are equal, round, and reactive to light.  Neck: Normal range of motion. Neck supple. No thyromegaly present.  Cardiovascular: Normal rate, regular rhythm and normal heart sounds.   No murmur heard. Pulmonary/Chest: Effort normal and breath sounds normal. No respiratory distress. He has no wheezes. He has no rales.  Abdominal: Soft. Bowel sounds are normal. He exhibits no distension. There is no tenderness.  Lymphadenopathy:    He has no cervical adenopathy.  Neurological: He is alert and oriented to person, place, and time. He has normal reflexes.  Skin: Skin is warm and dry.  Psychiatric: He has a normal mood and affect. His behavior is normal. Judgment and thought content normal.    No components found for:  [id number]  Lab Results  Component Value Date   WBC 2.8 (L) 07/21/2016   HGB 13.0 07/01/2016   HCT 36.3 (L) 07/21/2016   PLT 199 07/21/2016   GLUCOSE 85 07/21/2016   CHOL 165 07/21/2016   TRIG 155 (H) 07/21/2016   HDL 54 07/21/2016   LDLDIRECT 67.7 01/12/2012   LDLCALC 80 07/21/2016   ALT 7 07/21/2016   AST 12 07/21/2016   NA 131 (L) 07/21/2016   K 4.7 07/21/2016   CL 91 (L) 07/21/2016   CREATININE 0.77 07/21/2016   BUN 12 07/21/2016   CO2 26 07/21/2016   TSH 2.110 04/20/2016   PSA 1.42 01/12/2012   INR 0.97 11/03/2012   HGBA1C 6.1% 01/22/2015       Assessment & Plan:   Jermiah was seen today for hyperlipidemia.  Diagnoses and all orders for this visit:  Hypothyroidism, unspecified type -     Urinalysis, Complete -     TSH + free T4  Mixed hyperlipidemia -     CMP14+EGFR -     Lipid panel  ANEMIA, B12 DEFICIENCY -     CBC with Differential/Platelet  Atherosclerosis of native coronary artery of native heart without angina pectoris -     ERX54+MGQQ  Uncomplicated asthma, unspecified asthma severity, unspecified whether persistent  Primary osteoarthritis, unspecified site -     CMP14+EGFR  Benign prostatic hyperplasia with urinary frequency  Other orders -     ALPRAZolam (XANAX) 0.5 MG tablet; Take 1 tablet (0.5 mg total) by mouth at bedtime. -     DULoxetine (CYMBALTA) 20 MG capsule; Take  1 capsule (20 mg total) by mouth daily with supper. For anxiety -     diclofenac (VOLTAREN) 75 MG EC tablet; Take 1 tablet (75 mg total) by mouth 2 (two) times daily. For muscle and  Joint pain   I have discontinued Mr. Funke traZODone. I am also having him start on DULoxetine and diclofenac. Additionally, I am having him maintain his silodosin, levothyroxine, pantoprazole, valsartan, albuterol, and ALPRAZolam.  Meds ordered this encounter  Medications  . ALPRAZolam (XANAX) 0.5 MG tablet    Sig: Take 1 tablet (0.5 mg total) by mouth at bedtime.     Dispense:  30 tablet    Refill:  5  . DULoxetine (CYMBALTA) 20 MG capsule    Sig: Take 1 capsule (20 mg total) by mouth daily with supper. For anxiety    Dispense:  30 capsule    Refill:  3  . diclofenac (VOLTAREN) 75 MG EC tablet    Sig: Take 1 tablet (75 mg total) by mouth 2 (two) times daily. For muscle and  Joint pain    Dispense:  60 tablet    Refill:  2     Follow-up: Return in about 3 months (around 04/04/2017).  [name], M.D.

## 2017-01-06 LAB — CMP14+EGFR
ALBUMIN: 4.3 g/dL (ref 3.2–4.6)
ALT: 12 IU/L (ref 0–44)
AST: 14 IU/L (ref 0–40)
Albumin/Globulin Ratio: 2 (ref 1.2–2.2)
Alkaline Phosphatase: 75 IU/L (ref 39–117)
BILIRUBIN TOTAL: 0.5 mg/dL (ref 0.0–1.2)
BUN / CREAT RATIO: 14 (ref 10–24)
BUN: 14 mg/dL (ref 10–36)
CALCIUM: 9.7 mg/dL (ref 8.6–10.2)
CHLORIDE: 99 mmol/L (ref 96–106)
CO2: 21 mmol/L (ref 18–29)
Creatinine, Ser: 1.01 mg/dL (ref 0.76–1.27)
GFR, EST AFRICAN AMERICAN: 75 mL/min/{1.73_m2} (ref >59)
GFR, EST NON AFRICAN AMERICAN: 65 mL/min/{1.73_m2} (ref >59)
Globulin, Total: 2.2 g/dL (ref 1.5–4.5)
Glucose: 156 mg/dL — ABNORMAL HIGH (ref 65–99)
Potassium: 5.3 mmol/L — ABNORMAL HIGH (ref 3.5–5.2)
Sodium: 139 mmol/L (ref 134–144)
TOTAL PROTEIN: 6.5 g/dL (ref 6.0–8.5)

## 2017-01-06 LAB — CBC WITH DIFFERENTIAL/PLATELET
BASOS: 0 %
Basophils Absolute: 0 10*3/uL (ref 0.0–0.2)
EOS (ABSOLUTE): 0 10*3/uL (ref 0.0–0.4)
Eos: 2 %
HEMOGLOBIN: 12.5 g/dL — AB (ref 13.0–17.7)
Hematocrit: 37.8 % (ref 37.5–51.0)
Immature Grans (Abs): 0 10*3/uL (ref 0.0–0.1)
Immature Granulocytes: 1 %
LYMPHS ABS: 1 10*3/uL (ref 0.7–3.1)
Lymphs: 43 %
MCH: 33.7 pg — AB (ref 26.6–33.0)
MCHC: 33.1 g/dL (ref 31.5–35.7)
MCV: 102 fL — AB (ref 79–97)
MONOS ABS: 0.5 10*3/uL (ref 0.1–0.9)
Monocytes: 22 %
NEUTROS ABS: 0.8 10*3/uL — AB (ref 1.4–7.0)
NEUTROS PCT: 32 %
PLATELETS: 173 10*3/uL (ref 150–379)
RBC: 3.71 x10E6/uL — ABNORMAL LOW (ref 4.14–5.80)
RDW: 13.8 % (ref 12.3–15.4)
WBC: 2.4 10*3/uL — CL (ref 3.4–10.8)

## 2017-01-06 LAB — LIPID PANEL
Chol/HDL Ratio: 3.2 ratio units (ref 0.0–5.0)
Cholesterol, Total: 176 mg/dL (ref 100–199)
HDL: 55 mg/dL (ref >39)
LDL Calculated: 88 mg/dL (ref 0–99)
Triglycerides: 165 mg/dL — ABNORMAL HIGH (ref 0–149)
VLDL CHOLESTEROL CAL: 33 mg/dL (ref 5–40)

## 2017-01-06 LAB — TSH+FREE T4
FREE T4: 1.65 ng/dL (ref 0.82–1.77)
TSH: 2.66 u[IU]/mL (ref 0.450–4.500)

## 2017-01-22 ENCOUNTER — Ambulatory Visit (INDEPENDENT_AMBULATORY_CARE_PROVIDER_SITE_OTHER): Payer: Medicare Other | Admitting: Family Medicine

## 2017-01-22 ENCOUNTER — Encounter: Payer: Self-pay | Admitting: Family Medicine

## 2017-01-22 VITALS — BP 131/64 | HR 78 | Temp 96.9°F | Ht 67.0 in | Wt 136.4 lb

## 2017-01-22 DIAGNOSIS — R1030 Lower abdominal pain, unspecified: Secondary | ICD-10-CM

## 2017-01-22 LAB — MICROSCOPIC EXAMINATION
Bacteria, UA: NONE SEEN
Epithelial Cells (non renal): NONE SEEN /hpf (ref 0–10)
Renal Epithel, UA: NONE SEEN /hpf
WBC UA: NONE SEEN /HPF (ref 0–?)

## 2017-01-22 LAB — URINALYSIS, COMPLETE
BILIRUBIN UA: NEGATIVE
GLUCOSE, UA: NEGATIVE
Ketones, UA: NEGATIVE
Leukocytes, UA: NEGATIVE
NITRITE UA: NEGATIVE
PH UA: 6 (ref 5.0–7.5)
PROTEIN UA: NEGATIVE
Specific Gravity, UA: 1.01 (ref 1.005–1.030)
UUROB: 0.2 mg/dL (ref 0.2–1.0)

## 2017-01-22 NOTE — Progress Notes (Signed)
BP 131/64   Pulse 78   Temp (!) 96.9 F (36.1 C) (Oral)   Ht 5\' 7"  (1.702 m)   Wt 136 lb 6.4 oz (61.9 kg)   BMI 21.36 kg/m    Subjective:    Patient ID: Albert Allen, male    DOB: 06-06-1923, 83 y.o.   MRN: [id number]  HPI: BRAILEN Allen is a 83 y.o. male presenting on 01/22/2017 for Nausea; decrease appetite ; Nasal Congestion; and sneezing   HPI Sneezing and nasal congestion and abdominal pain and decreased appetite Patient has been having sneezing and nasal congestion which he gets his long-term but most recently has had increased abdominal pain and some urinary issues. He feels like he is not urinating as well at night. He denies any fevers or chills or flank pain. He denies any hematuria. He does notice that he has been getting up to go frequently at night and feels like he has an urgency but is not having as much come out. He isn't waking up 3 or 4 times at night with this. He denies any sick contacts that he knows of. He denies any fevers or chills  Relevant past medical, surgical, family and social history reviewed and updated as indicated. Interim medical history since our last visit reviewed. Allergies and medications reviewed and updated.  Review of Systems  Constitutional: Negative for chills and fever.  HENT: Positive for rhinorrhea and sneezing. Negative for congestion, sinus pressure and sore throat.   Respiratory: Negative for cough, shortness of breath and wheezing.   Cardiovascular: Negative for chest pain and leg swelling.  Gastrointestinal: Positive for abdominal pain, constipation and nausea. Negative for abdominal distention, diarrhea and vomiting.  Genitourinary: Positive for decreased urine volume, frequency and urgency. Negative for dysuria and hematuria.  Musculoskeletal: Negative for back pain and gait problem.  Skin: Negative for rash.  All other systems reviewed and are negative.   Per HPI unless specifically indicated above     Objective:     BP 131/64   Pulse 78   Temp (!) 96.9 F (36.1 C) (Oral)   Ht 5\' 7"  (1.702 m)   Wt 136 lb 6.4 oz (61.9 kg)   BMI 21.36 kg/m   Wt Readings from Last 3 Encounters:  01/22/17 136 lb 6.4 oz (61.9 kg)  01/05/17 139 lb (63 kg)  10/05/16 136 lb 6.4 oz (61.9 kg)    Physical Exam  Constitutional: He is oriented to person, place, and time. He appears well-developed and well-nourished. No distress.  HENT:  Right Ear: External ear normal.  Left Ear: External ear normal.  Nose: Nose normal.  Mouth/Throat: Oropharynx is clear and moist.  Eyes: Conjunctivae are normal. Right eye exhibits no discharge. Left eye exhibits no discharge. No scleral icterus.  Cardiovascular: Normal rate, regular rhythm, normal heart sounds and intact distal pulses.   No murmur heard. Pulmonary/Chest: Effort normal and breath sounds normal. No respiratory distress. He has no wheezes. He has no rales.  Abdominal: Soft. Bowel sounds are normal. He exhibits no distension. There is tenderness in the suprapubic area. There is no rigidity, no rebound, no guarding and no CVA tenderness. No hernia.  Musculoskeletal: Normal range of motion. He exhibits no edema.  Neurological: He is alert and oriented to person, place, and time. Coordination normal.  Skin: Skin is warm and dry. No rash noted. He is not diaphoretic.  Psychiatric: He has a normal mood and affect. His behavior is normal.  Nursing note and  vitals reviewed.   Urinalysis: 0-2 RBCs but otherwise normal.    Assessment & Plan:   Problem List Items Addressed This Visit    None    Visit Diagnoses    Lower abdominal pain    -  Primary   Likely constipation, recommended MiraLAX and Zantac when necessary for the indigestion.   Relevant Orders   Urinalysis, Complete       Follow up plan: Return if symptoms worsen or fail to improve.  Counseling provided for all of the vaccine components Orders Placed This Encounter  Procedures  . Urinalysis, Complete     Caryl Pina, MD [facility] 01/22/2017, 10:23 AM

## 2017-02-02 NOTE — Progress Notes (Signed)
Flu shot only today. 

## 2017-02-03 ENCOUNTER — Encounter: Payer: Self-pay | Admitting: *Deleted

## 2017-02-10 ENCOUNTER — Ambulatory Visit (INDEPENDENT_AMBULATORY_CARE_PROVIDER_SITE_OTHER): Payer: Medicare Other | Admitting: Pediatrics

## 2017-02-10 ENCOUNTER — Encounter: Payer: Self-pay | Admitting: Pediatrics

## 2017-02-10 VITALS — BP 143/73 | HR 87 | Temp 96.0°F | Ht 67.0 in | Wt 137.4 lb

## 2017-02-10 DIAGNOSIS — T23002A Burn of unspecified degree of left hand, unspecified site, initial encounter: Secondary | ICD-10-CM

## 2017-02-10 DIAGNOSIS — T300 Burn of unspecified body region, unspecified degree: Secondary | ICD-10-CM

## 2017-02-10 MED ORDER — MUPIROCIN 2 % EX OINT: TOPICAL_OINTMENT | CUTANEOUS | 1 refills | Status: DC
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
## 2017-02-17 ENCOUNTER — Ambulatory Visit (INDEPENDENT_AMBULATORY_CARE_PROVIDER_SITE_OTHER): Payer: Medicare Other | Admitting: Pediatrics

## 2017-02-17 VITALS — BP 171/72 | HR 79 | Temp 96.8°F | Ht 67.0 in | Wt 137.0 lb

## 2017-02-17 DIAGNOSIS — T23002D Burn of unspecified degree of left hand, unspecified site, subsequent encounter: Secondary | ICD-10-CM

## 2017-02-17 DIAGNOSIS — T300 Burn of unspecified body region, unspecified degree: Secondary | ICD-10-CM

## 2017-02-18 ENCOUNTER — Ambulatory Visit: Payer: Medicare Other | Admitting: Pediatrics

## 2017-02-19 DIAGNOSIS — T300 Burn of unspecified body region, unspecified degree: Secondary | ICD-10-CM | POA: Insufficient documentation

## 2017-02-19 NOTE — Progress Notes (Signed)
  Subjective:   Patient ID: Albert Allen, male    DOB: Jul 01, 1923, 83 y.o.   MRN: [id number] CC: Burn to left hand  HPI: Albert Allen is a 83 y.o. male presenting for Burn to left hand  Thinks hand is much improved Thumb not hurting as much Finger also improving No redness, no discharge  Here for follow up  Otherwise feeling well, no headaches, chest pain Says BPs are much lower at home  Relevant past medical, surgical, family and social history reviewed. Allergies and medications reviewed and updated. History  Smoking Status  . Former Smoker  . Types: Cigarettes  . Quit date: 04/24/1998  Smokeless Tobacco  . Never Used   ROS: Per HPI   Objective:    BP (!) 171/72   Pulse 79   Temp (!) 96.8 F (36 C) (Oral)   Ht 5\' 7"  (1.702 m)   Wt 137 lb (62.1 kg)   BMI 21.46 kg/m   Wt Readings from Last 3 Encounters:  02/17/17 137 lb (62.1 kg)  02/10/17 137 lb 6.4 oz (62.3 kg)  01/22/17 136 lb 6.4 oz (61.9 kg)    Gen: NAD, alert, cooperative with exam, NCAT EYES: EOMI, no conjunctival injection, or no icterus Resp:normal WOB Neuro: Alert and oriented Skin: L thumb with distal tip well healing, dry  L 2nd finger PIP with slightly decreased ROM due to pain in partial thickness burn, no discharge, sensation intact throughout  Assessment & Plan:  Albert Allen was seen today for burn to left hand.  Diagnoses and all orders for this visit:  Burn Well healing, discussed return precautions, continue mupirocin ointment BID  Follow up plan: prn [name], MD [place]

## 2017-03-03 ENCOUNTER — Ambulatory Visit: Payer: Medicare Other | Admitting: Pediatrics

## 2017-03-18 ENCOUNTER — Other Ambulatory Visit: Payer: Self-pay | Admitting: Family Medicine

## 2017-03-19 ENCOUNTER — Telehealth: Payer: Self-pay | Admitting: Family Medicine

## 2017-03-19 NOTE — Telephone Encounter (Signed)
[address] and refills given to pharmacist per last prescription on 12/2016.  Attempted to contact patient but phone is busy

## 2017-03-19 NOTE — Telephone Encounter (Signed)
What is the name of the medication? ALPRAZolam (XANAX) 0.5 MG tablet  Have you contacted your pharmacy to request a refill? Yes, pharmacy needs more information. Pt is aware that he should have refills, that is why he is calling.  Which pharmacy would you like this sent to? [facility]    Patient notified that their request is being sent to the clinical staff for review and that they should receive a call once it is complete. If they do not receive a call within 24 hours they can check with their pharmacy or our office.

## 2017-03-19 NOTE — Telephone Encounter (Signed)
Script with refills had not been called to pharmacy.  Script was refilled.

## 2017-03-25 ENCOUNTER — Other Ambulatory Visit: Payer: Self-pay | Admitting: Family Medicine

## 2017-03-25 DIAGNOSIS — J206 Acute bronchitis due to rhinovirus: Secondary | ICD-10-CM

## 2017-03-29 DIAGNOSIS — N138 Other obstructive and reflux uropathy: Secondary | ICD-10-CM | POA: Diagnosis not present

## 2017-03-29 DIAGNOSIS — N401 Enlarged prostate with lower urinary tract symptoms: Secondary | ICD-10-CM | POA: Diagnosis not present

## 2017-04-05 ENCOUNTER — Ambulatory Visit: Payer: Medicare Other | Admitting: Family Medicine

## 2017-04-12 ENCOUNTER — Ambulatory Visit (INDEPENDENT_AMBULATORY_CARE_PROVIDER_SITE_OTHER): Payer: Medicare Other

## 2017-04-12 ENCOUNTER — Encounter: Payer: Self-pay | Admitting: Family Medicine

## 2017-04-12 ENCOUNTER — Other Ambulatory Visit: Payer: Self-pay | Admitting: *Deleted

## 2017-04-12 ENCOUNTER — Ambulatory Visit (INDEPENDENT_AMBULATORY_CARE_PROVIDER_SITE_OTHER): Payer: Medicare Other | Admitting: Family Medicine

## 2017-04-12 VITALS — BP 133/71 | HR 99 | Temp 97.8°F | Ht 67.0 in | Wt 135.0 lb

## 2017-04-12 DIAGNOSIS — K2100 Gastro-esophageal reflux disease with esophagitis, without bleeding: Secondary | ICD-10-CM

## 2017-04-12 DIAGNOSIS — I10 Essential (primary) hypertension: Secondary | ICD-10-CM | POA: Diagnosis not present

## 2017-04-12 DIAGNOSIS — E039 Hypothyroidism, unspecified: Secondary | ICD-10-CM

## 2017-04-12 DIAGNOSIS — M1991 Primary osteoarthritis, unspecified site: Secondary | ICD-10-CM | POA: Diagnosis not present

## 2017-04-12 DIAGNOSIS — J206 Acute bronchitis due to rhinovirus: Secondary | ICD-10-CM

## 2017-04-12 MED ORDER — PANTOPRAZOLE SODIUM 40 MG PO TBEC: 40.0000 mg | DELAYED_RELEASE_TABLET | Freq: Every day | ORAL | 3 refills | Status: DC

## 2017-04-12 MED ORDER — MONTELUKAST SODIUM 10 MG PO TABS: 10.0000 mg | ORAL_TABLET | Freq: Every day | ORAL | 3 refills | Status: DC

## 2017-04-12 MED ORDER — LEVOTHYROXINE SODIUM 75 MCG PO TABS: 75.0000 ug | ORAL_TABLET | Freq: Every day | ORAL | 3 refills | Status: DC

## 2017-04-12 NOTE — Progress Notes (Signed)
Subjective:  Patient ID: Albert Allen, male    DOB: 1923/05/24  Age: 83 y.o. MRN: [id number]  CC: Hypothyroidism (pt here today for routine follow up on chronic medical conditions)   HPI Albert Allen presents for  follow-up of hypertension. Patient has no history of headache chest pain or shortness of breath or recent cough. Patient also denies symptoms of TIA such as numbness weakness lateralizing. Patient checks  blood pressure at home and has not had any elevated readings recently. Patient denies side effects from medication. States taking it regularly. Back pain - requests brace. Neck pain too. Both are daily, moderately intense pressure in spite of diclofenac.Was contacted by phone and offered a back brace by insurance. Said he just needed a scrip from me. Pt. Hoping that will give better pain relief.  Patient presents for follow-up on  thyroid. The patient has a history of hypothyroidism for many years. It has been stable recently. Pt. denies any change in  voice, loss of hair, heat or cold intolerance. Energy level has been adequate to good. Patient denies constipation and diarrhea. No myxedema. Medication is as noted below. Verified that pt is taking it daily on an empty stomach. Well tolerated.  Patient in for follow-up of GERD. Currently asymptomatic taking  PPI daily. There is no chest pain or heartburn. No hematemesis and no melena. No dysphagia or choking. Onset is remote. Progression is stable. Complicating factors, none.     History Albert Allen has a past medical history of ABNORMAL CV (STRESS) TEST (03/05/2009); ABUSE, ALCOHOL, IN REMISSION (05/31/2007); Adhesive capsulitis of shoulder (12/29/2007); Alcoholic polyneuropathy ([place]) (03/29/2008); ANEMIA, B12 DEFICIENCY (03/29/2008); Anxiety state, unspecified (11/19/2008); ASTHMA (05/31/2007); CAD, NATIVE VESSEL (04/04/2009); CARPAL TUNNEL SYNDROME, RIGHT (09/05/2007); Chronic prostatitis (03/29/2008); Degenerative disc disease, cervical;  DEGENERATIVE DISC DISEASE, CERVICAL SPINE (06/22/2007); DYSPHAGIA UNSPECIFIED (09/25/2008); Esophageal stricture; GERD (05/31/2007); GOUT (05/31/2007); Hepatomegaly (09/25/2008); Hiatal hernia; HIP PAIN (03/20/2010); History of echocardiogram; HYPERLIPIDEMIA (03/27/2009); HYPERTENSION (05/31/2007); HYPOTHYROIDISM (05/31/2007); LEFT BUNDLE BRANCH BLOCK (02/11/2009); Liver mass, left lobe; LOC OSTEOARTHROS NOT SPEC PRIM/SEC LOWER LEG (03/20/2010); OSTEOARTHRITIS (05/31/2007); PERSONAL HX COLONIC POLYPS (03/05/1997); PSA, INCREASED (03/20/2010); SINUSITIS, CHRONIC NOS (05/31/2007); Sore throat; SYNCOPE (11/19/2008); and WEIGHT LOSS, RECENT (11/19/2008).   He has a past surgical history that includes pul. colapse w/chest tube; Tonsillectomy; carpel tunnel release; ORIF ulnar fracture (11/03/2012); Eye surgery; Inguinal hernia repair (12/18/2015); Inguinal hernia repair (Right, 12/18/2015); and Insertion of mesh (Right, 12/18/2015).   His family history includes Alcohol abuse in his father; Cancer in his brother and mother; Diabetes in his brother, sister, and sister; Lung disease in his father.He reports that he quit smoking about 18 years ago. His smoking use included Cigarettes. He has never used smokeless tobacco. He reports that he does not drink alcohol or use drugs.  Current Outpatient Prescriptions on File Prior to Visit  Medication Sig Dispense Refill  . albuterol (PROVENTIL HFA;VENTOLIN HFA) 108 (90 Base) MCG/ACT inhaler Inhale 1-2 puffs into the lungs every 6 (six) hours as needed for wheezing or shortness of breath. 4 Inhaler 3  . ALPRAZolam (XANAX) 0.5 MG tablet Take 1 tablet (0.5 mg total) by mouth at bedtime. 30 tablet 5  . diclofenac (VOLTAREN) 75 MG EC tablet Take 1 tablet (75 mg total) by mouth 2 (two) times daily. For muscle and  Joint pain 60 tablet 2  . valsartan (DIOVAN) 160 MG tablet Take 1 tablet (160 mg total) by mouth daily. 90 tablet 3  . DULoxetine (CYMBALTA) 20 MG capsule Take 1 capsule (  20 mg total) by  mouth daily with supper. For anxiety (Patient not taking: Reported on 04/12/2017) 30 capsule 3  . silodosin (RAPAFLO) 4 MG CAPS capsule Take 4 mg by mouth every evening.     No current facility-administered medications on file prior to visit.     ROS Review of Systems  Constitutional: Negative for chills, diaphoresis, fever and unexpected weight change.  HENT: Negative for congestion, hearing loss, rhinorrhea and sore throat.   Eyes: Negative for visual disturbance.  Respiratory: Negative for cough and shortness of breath.   Cardiovascular: Negative for chest pain.  Gastrointestinal: Negative for abdominal pain, constipation and diarrhea.  Genitourinary: Negative for dysuria and flank pain.  Musculoskeletal: Positive for arthralgias, back pain and neck pain. Negative for joint swelling.  Skin: Negative for rash.  Neurological: Negative for dizziness and headaches.  Psychiatric/Behavioral: Negative for dysphoric mood and sleep disturbance.    Objective:  BP 133/71   Pulse 99   Temp 97.8 F (36.6 C) (Oral)   Ht 5\' 7"  (1.702 m)   Wt 135 lb (61.2 kg)   BMI 21.14 kg/m   BP Readings from Last 3 Encounters:  04/12/17 133/71  02/17/17 (!) 171/72  02/10/17 (!) 143/73    Wt Readings from Last 3 Encounters:  04/12/17 135 lb (61.2 kg)  02/17/17 137 lb (62.1 kg)  02/10/17 137 lb 6.4 oz (62.3 kg)     Physical Exam  Constitutional: He is oriented to person, place, and time. He appears well-developed and well-nourished. No distress.  HENT:  Head: Normocephalic and atraumatic.  Right Ear: External ear normal.  Left Ear: External ear normal.  Nose: Nose normal.  Mouth/Throat: Oropharynx is clear and moist.  Eyes: Conjunctivae and EOM are normal. Pupils are equal, round, and reactive to light.  Neck: Normal range of motion. Neck supple. No thyromegaly present.  Cardiovascular: Normal rate, regular rhythm and normal heart sounds.   No murmur heard. Pulmonary/Chest: Effort normal  and breath sounds normal. No respiratory distress. He has no wheezes. He has no rales.  Abdominal: Soft. Bowel sounds are normal. He exhibits no distension. There is tenderness (for lumbar musculature).  Lymphadenopathy:    He has no cervical adenopathy.  Neurological: He is alert and oriented to person, place, and time. He has normal reflexes.  Skin: Skin is warm and dry.  Psychiatric: He has a normal mood and affect. His behavior is normal. Judgment and thought content normal.     Lab Results  Component Value Date   WBC 2.4 (LL) 01/05/2017   HGB 13.0 07/01/2016   HCT 37.8 01/05/2017   PLT 173 01/05/2017   GLUCOSE 156 (H) 01/05/2017   CHOL 176 01/05/2017   TRIG 165 (H) 01/05/2017   HDL 55 01/05/2017   LDLDIRECT 67.7 01/12/2012   LDLCALC 88 01/05/2017   ALT 12 01/05/2017   AST 14 01/05/2017   NA 139 01/05/2017   K 5.3 (H) 01/05/2017   CL 99 01/05/2017   CREATININE 1.01 01/05/2017   BUN 14 01/05/2017   CO2 21 01/05/2017   TSH 2.660 01/05/2017   PSA 1.42 01/12/2012   INR 0.97 11/03/2012   HGBA1C 6.1% 01/22/2015     Assessment & Plan:    Hypothyroidism, unspecified type  Essential hypertension  Primary osteoarthritis, unspecified site - Plan: DG Lumbar Spine 2-3 Views, DG Cervical Spine Complete  Gastroesophageal reflux disease with esophagitis - Plan: pantoprazole (PROTONIX) 40 MG tablet, DISCONTINUED: pantoprazole (PROTONIX) 40 MG tablet  (pantoprazole NOT discontinued -  as noted above)WS   Allergies as of 04/12/2017      Reactions   Amoxicillin Swelling, Other (See Comments)   Pt has swelling with PCN's   Penicillins Swelling   Lips swelling Has patient had a PCN reaction causing immediate rash, facial/tongue/throat swelling, SOB or lightheadedness with hypotension: No Has patient had a PCN reaction causing severe rash involving mucus membranes or skin necrosis: No Has patient had a PCN reaction that required hospitalization No Has patient had a PCN reaction  occurring within the last 10 years: No If all of the above answers are "NO", then may proceed with Cephalosporin use.   Ceclor [cefaclor] Other (See Comments)   Pt does not remember reaction   Celebrex [celecoxib] Other (See Comments)   Pt does not remember reaction   Levaquin [levofloxacin In D5w] Other (See Comments)   Pt does not remember reaction      Medication List       Accurate as of 04/12/17  9:00 PM. Always use your most recent med list.          albuterol 108 (90 Base) MCG/ACT inhaler Commonly known as:  PROVENTIL HFA;VENTOLIN HFA Inhale 1-2 puffs into the lungs every 6 (six) hours as needed for wheezing or shortness of breath.   ALPRAZolam 0.5 MG tablet Commonly known as:  XANAX Take 1 tablet (0.5 mg total) by mouth at bedtime.   diclofenac 75 MG EC tablet Commonly known as:  VOLTAREN Take 1 tablet (75 mg total) by mouth 2 (two) times daily. For muscle and  Joint pain   DULoxetine 20 MG capsule Commonly known as:  CYMBALTA Take 1 capsule (20 mg total) by mouth daily with supper. For anxiety   levothyroxine 75 MCG tablet Commonly known as:  SYNTHROID, LEVOTHROID Take 1 tablet (75 mcg total) by mouth daily before breakfast.   montelukast 10 MG tablet Commonly known as:  SINGULAIR Take 1 tablet (10 mg total) by mouth at bedtime.   pantoprazole 40 MG tablet Commonly known as:  PROTONIX Take 1 tablet (40 mg total) by mouth daily.   RAPAFLO 4 MG Caps capsule Generic drug:  silodosin Take 4 mg by mouth every evening.   valsartan 160 MG tablet Commonly known as:  DIOVAN Take 1 tablet (160 mg total) by mouth daily.        Follow-up: Return in about 3 months (around 07/13/2017) for hypertension, Pain, Arthritis.  Albert Allen, M.D.

## 2017-04-12 NOTE — Progress Notes (Signed)
Pt requested RXs be sent into [facility] and cancelled RXs and resent into [facility] pr pt request

## 2017-04-14 ENCOUNTER — Encounter: Payer: Self-pay | Admitting: Cardiovascular Disease

## 2017-04-14 ENCOUNTER — Ambulatory Visit (INDEPENDENT_AMBULATORY_CARE_PROVIDER_SITE_OTHER): Payer: Medicare Other | Admitting: Cardiovascular Disease

## 2017-04-14 ENCOUNTER — Other Ambulatory Visit: Payer: Self-pay | Admitting: *Deleted

## 2017-04-14 VITALS — BP 138/70 | HR 84 | Ht 67.0 in | Wt 133.4 lb

## 2017-04-14 DIAGNOSIS — I10 Essential (primary) hypertension: Secondary | ICD-10-CM | POA: Diagnosis not present

## 2017-04-14 DIAGNOSIS — I2510 Atherosclerotic heart disease of native coronary artery without angina pectoris: Secondary | ICD-10-CM | POA: Diagnosis not present

## 2017-04-14 MED ORDER — PRAVASTATIN SODIUM 40 MG PO TABS: 40.0000 mg | ORAL_TABLET | Freq: Every evening | ORAL | 3 refills | Status: DC

## 2017-04-14 MED ORDER — ASPIRIN EC 81 MG PO TBEC: 81.0000 mg | DELAYED_RELEASE_TABLET | Freq: Every day | ORAL | 3 refills | Status: DC

## 2017-04-14 MED ORDER — NITROGLYCERIN 0.4 MG SL SUBL: 0.4000 mg | SUBLINGUAL_TABLET | SUBLINGUAL | 3 refills | Status: DC | PRN

## 2017-04-14 NOTE — Patient Instructions (Addendum)
Medication Instructions:  Your physician has recommended you make the following change in your medication:  Start Pravastatin 40 mg by mouth daily. Start aspirin 81 mg by mouth daily.    Labwork: Your physician recommends that you return for lab work in: 12 weeks. --Lipid and Liver profiles.  This will be fasting.  Scheduled for 07/06/17   Testing/Procedures: none  Follow-Up: Your physician recommends that you schedule a follow-up appointment in: 6 months. Please call our office in about 3 months to schedule this appointment    Any Other Special Instructions Will Be Listed Below (If Applicable).     If you need a refill on your cardiac medications before your next appointment, please call your pharmacy.

## 2017-04-14 NOTE — Progress Notes (Signed)
Chief Complaint  Patient presents with  . Follow-up    CAD    History of Present Illness: 83 yo WM with past medical history significant for CAD, HTN, anemia, asthma/COPD, GERD, OA, gout hypothyroidism, hiatal hernia and alcohol abuse here today for follow up. Cardiac cath April 2010 showed a 70-80% heavily calcified stenosis in the moderate caliber mid LAD. The LAD was not felt to be be a good vessel for PCI. There was diffuse moderately obstructive disease in the other vessels. We elected for medical management given that he was asymptomatic. No stents were placed. Vascular screening at [facility] October 2015 with mild bilateral carotid artery disease, normal ABI.  Echo 03/18/16 with normal LV function, mild LVH, trivial AI, mild MR. He has had atypical chest pain over the last few years which has been felt to be related to GERD and anxiety. His ASA was stopped due to GI issues and his statin was also stopped.   He is here today for follow up. He has rare chest pains that last for a few seconds. The patient denies any dyspnea, palpitations, lower extremity edema, orthopnea, PND, dizziness, near syncope or syncope.     Primary Care Physician: [name], MD   Past Medical History:  Diagnosis Date  . ABNORMAL CV (STRESS) TEST 03/05/2009  . ABUSE, ALCOHOL, IN REMISSION 05/31/2007  . Adhesive capsulitis of shoulder 12/29/2007  . Alcoholic polyneuropathy ([place]) 03/29/2008  . ANEMIA, B12 DEFICIENCY 03/29/2008  . Anxiety state, unspecified 11/19/2008  . ASTHMA 05/31/2007  . CAD, NATIVE VESSEL 04/04/2009  . CARPAL TUNNEL SYNDROME, RIGHT 09/05/2007  . Chronic prostatitis 03/29/2008  . Degenerative disc disease, cervical   . DEGENERATIVE DISC DISEASE, CERVICAL SPINE 06/22/2007  . DYSPHAGIA UNSPECIFIED 09/25/2008  . Esophageal stricture   . GERD 05/31/2007  . GOUT 05/31/2007  . Hepatomegaly 09/25/2008  . Hiatal hernia   . HIP PAIN 03/20/2010  . History of echocardiogram    Echo 4/17 - mild LVH,  EF 50-55%, Gr 1 DD, trivial AI, MAC, mild MR  . HYPERLIPIDEMIA 03/27/2009  . HYPERTENSION 05/31/2007  . HYPOTHYROIDISM 05/31/2007  . LEFT BUNDLE BRANCH BLOCK 02/11/2009  . Liver mass, left lobe   . LOC OSTEOARTHROS NOT SPEC PRIM/SEC LOWER LEG 03/20/2010  . OSTEOARTHRITIS 05/31/2007  . PERSONAL HX COLONIC POLYPS 03/05/1997   adenomatous   . PSA, INCREASED 03/20/2010  . SINUSITIS, CHRONIC NOS 05/31/2007  . Sore throat    CURRENTLY  . SYNCOPE 11/19/2008  . WEIGHT LOSS, RECENT 11/19/2008    Past Surgical History:  Procedure Laterality Date  . carpel tunnel release    . EYE SURGERY    . INGUINAL HERNIA REPAIR  12/18/2015  . INGUINAL HERNIA REPAIR Right 12/18/2015   Procedure: OPEN REPAIR RIGHT INGUINAL HERNIA ;  Surgeon: Greer Pickerel, MD;  Location: [place];  Service: General;  Laterality: Right;  . INSERTION OF MESH Right 12/18/2015   Procedure: INSERTION OF MESH;  Surgeon: Greer Pickerel, MD;  Location: [place];  Service: General;  Laterality: Right;  . ORIF ULNAR FRACTURE  11/03/2012   Procedure: OPEN REDUCTION INTERNAL FIXATION (ORIF) ULNAR FRACTURE;  Surgeon: Hessie Dibble, MD;  Location: [place];  Service: Orthopedics;  Laterality: Right;  ORIF radius and ulnar fractures  . pul. colapse w/chest tube    . TONSILLECTOMY      Current Outpatient Prescriptions  Medication Sig Dispense Refill  . albuterol (PROVENTIL HFA;VENTOLIN HFA) 108 (90 Base) MCG/ACT inhaler Inhale 1-2 puffs  into the lungs every 6 (six) hours as needed for wheezing or shortness of breath. 4 Inhaler 3  . ALPRAZolam (XANAX) 0.5 MG tablet Take 1 tablet (0.5 mg total) by mouth at bedtime. 30 tablet 5  . diclofenac (VOLTAREN) 75 MG EC tablet Take 1 tablet (75 mg total) by mouth 2 (two) times daily. For muscle and  Joint pain 60 tablet 2  . levothyroxine (SYNTHROID, LEVOTHROID) 75 MCG tablet Take 1 tablet (75 mcg total) by mouth daily before breakfast. 90 tablet 3  . montelukast (SINGULAIR) 10 MG tablet Take 1 tablet (10 mg total) by mouth  at bedtime. 30 tablet 3  . pantoprazole (PROTONIX) 40 MG tablet Take 1 tablet (40 mg total) by mouth daily. 90 tablet 3  . valsartan (DIOVAN) 160 MG tablet Take 1 tablet (160 mg total) by mouth daily. 90 tablet 3  . aspirin EC 81 MG tablet Take 1 tablet (81 mg total) by mouth daily. 90 tablet 3  . pravastatin (PRAVACHOL) 40 MG tablet Take 1 tablet (40 mg total) by mouth every evening. 90 tablet 3   No current facility-administered medications for this visit.     Allergies  Allergen Reactions  . Amoxicillin Swelling and Other (See Comments)    Pt has swelling with PCN's  . Penicillins Swelling    Lips swelling Has patient had a PCN reaction causing immediate rash, facial/tongue/throat swelling, SOB or lightheadedness with hypotension: No Has patient had a PCN reaction causing severe rash involving mucus membranes or skin necrosis: No Has patient had a PCN reaction that required hospitalization No Has patient had a PCN reaction occurring within the last 10 years: No If all of the above answers are "NO", then may proceed with Cephalosporin use.  Blair Dolphin [Cefaclor] Other (See Comments)    Pt does not remember reaction  . Celebrex [Celecoxib] Other (See Comments)    Pt does not remember reaction  . Levaquin [Levofloxacin In D5w] Other (See Comments)    Pt does not remember reaction    Social History   Social History  . Marital status: Married    Spouse name: N/A  . Number of children: N/A  . Years of education: N/A   Occupational History  . retired     Oncologist   Social History Main Topics  . Smoking status: Former Smoker    Types: Cigarettes    Quit date: 04/24/1998  . Smokeless tobacco: Never Used  . Alcohol use No     Comment: 3 mos  . Drug use: No  . Sexual activity: Not Currently   Other Topics Concern  . Not on file   Social History Narrative  . No narrative on file    Family History  Problem Relation Age of Onset  . Cancer Mother        In back but  unsure of what type of cancer  . Alcohol abuse Father   . Lung disease Father        abscess  . Diabetes Sister   . Cancer Brother        throat  . Diabetes Brother   . Diabetes Sister   . Colon cancer Neg Hx     Review of Systems:  As stated in the HPI and otherwise negative.   BP 138/70   Pulse 84   Ht _0  (1.702 m)   Wt 133 lb 6.4 oz (60.5 kg)   SpO2 98%   BMI 20.89 kg/m   Physical  Examination: General: Well developed, well nourished, NAD  HEENT: OP clear, mucus membranes moist  SKIN: warm, dry. No rashes. Neuro: No focal deficits  Musculoskeletal: Muscle strength 5/5 all ext  Psychiatric: Mood and affect normal  Neck: No JVD, no carotid bruits, no thyromegaly, no lymphadenopathy.  Lungs:Clear bilaterally, no wheezes, rhonci, crackles Cardiovascular: Regular rate and rhythm. No murmurs, gallops or rubs. Abdomen:Soft. Bowel sounds present. Non-tender.  Extremities: No lower extremity edema. Pulses are 2 + in the bilateral DP/PT.   Echo 03/18/16: Left ventricle: Abnormal septal motion The cavity size was mildly   dilated. Wall thickness was increased in a pattern of mild LVH.   Systolic function was normal. The estimated ejection fraction was   in the range of 50% to 55%. Doppler parameters are consistent   with abnormal left ventricular relaxation (grade 1 diastolic   dysfunction). - Aortic valve: There was trivial regurgitation. - Mitral valve: Calcified annulus. Mildly thickened leaflets .   There was mild regurgitation. - Atrial septum: No defect or patent foramen ovale was identified.  EKG:  EKG is ordered today. The ekg ordered today demonstrates NSR, rate 77 bpm. 1st degree AV block.   Recent Labs: 07/01/2016: Hemoglobin 13.0 01/05/2017: ALT 12; BUN 14; Creatinine, Ser 1.01; Platelets 173; Potassium 5.3; Sodium 139; TSH 2.660   Lipid Panel    Component Value Date/Time   CHOL 176 01/05/2017 0842   CHOL 177 05/01/2013 1059   TRIG 165 (H) 01/05/2017 0842     TRIG 196 (H) 05/01/2013 1059   HDL 55 01/05/2017 0842   HDL 45 05/01/2013 1059   CHOLHDL 3.2 01/05/2017 0842   CHOLHDL 4 09/22/2012 1051   VLDL 35.2 09/22/2012 1051   LDLCALC 88 01/05/2017 0842   LDLCALC 93 05/01/2013 1059   LDLDIRECT 67.7 01/12/2012 0823     Wt Readings from Last 3 Encounters:  04/14/17 133 lb 6.4 oz (60.5 kg)  04/12/17 135 lb (61.2 kg)  02/17/17 137 lb (62.1 kg)     Other studies Reviewed: Additional studies/ records that were reviewed today include: . Review of the above records demonstrates:    Assessment and Plan:   1. CAD with stable angina: He has chronic stable angina. Will restart ASA and statin.  Will give him a prescription for NTG to use prn. Given his advanced age, prefer conservative approach to his CAD. No plans for cath at this time. Beta blocker stopped due to bradycardia.    2. HTN: BP controlled. No changes.   3. LBBB: No evidence of LBBB today on EKG  Current medicines are reviewed at length with the patient today.  The patient does not have concerns regarding medicines.  The following changes have been made:  no change  Labs/ tests ordered today include:   Orders Placed This Encounter  Procedures  . Lipid Profile  . Hepatic function panel  . EKG 12-Lead    Disposition:   FU with me in 6 months  Signed, Lauree Chandler, MD 04/14/2017 12:13 PM    [facility] [address], [place], [place]  [address] Phone: [phone]; Fax: [phone]

## 2017-04-28 DIAGNOSIS — H353132 Nonexudative age-related macular degeneration, bilateral, intermediate dry stage: Secondary | ICD-10-CM | POA: Diagnosis not present

## 2017-04-28 DIAGNOSIS — Z961 Presence of intraocular lens: Secondary | ICD-10-CM | POA: Diagnosis not present

## 2017-04-28 DIAGNOSIS — H4323 Crystalline deposits in vitreous body, bilateral: Secondary | ICD-10-CM | POA: Diagnosis not present

## 2017-04-28 DIAGNOSIS — H04123 Dry eye syndrome of bilateral lacrimal glands: Secondary | ICD-10-CM | POA: Diagnosis not present

## 2017-04-29 ENCOUNTER — Telehealth: Payer: Self-pay | Admitting: Family Medicine

## 2017-04-29 NOTE — Telephone Encounter (Signed)
I can't find a DME order for a back brace. Do you know anything about this? Pt aware you are out of the office today and will return tomorrow.

## 2017-04-30 ENCOUNTER — Other Ambulatory Visit: Payer: Self-pay | Admitting: Family Medicine

## 2017-04-30 DIAGNOSIS — G8929 Other chronic pain: Secondary | ICD-10-CM

## 2017-04-30 DIAGNOSIS — M5441 Lumbago with sciatica, right side: Principal | ICD-10-CM

## 2017-04-30 NOTE — Telephone Encounter (Signed)
Pt notified of RX Pt will pick prescription up

## 2017-04-30 NOTE — Telephone Encounter (Signed)
I did one just now. Hopefully printed

## 2017-07-01 ENCOUNTER — Ambulatory Visit (INDEPENDENT_AMBULATORY_CARE_PROVIDER_SITE_OTHER): Payer: Medicare Other | Admitting: *Deleted

## 2017-07-01 VITALS — BP 155/66 | HR 74 | Ht 67.0 in | Wt 135.0 lb

## 2017-07-01 DIAGNOSIS — Z0000 Encounter for general adult medical examination without abnormal findings: Secondary | ICD-10-CM | POA: Diagnosis not present

## 2017-07-01 NOTE — Patient Instructions (Signed)
  Mr. Albert Allen ,  Thank you for taking time to come for your Medicare Wellness Visit. I appreciate your ongoing commitment to your health goals. Please review the following plan we discussed and let me know if I can assist you in the future.   These are the goals we discussed: Goals    . Exercise 3x per week (30 min per time)    . Increase water intake       This is a list of the screening recommended for you and due dates:  Health Maintenance  Topic Date Due  . Tetanus Vaccine  12/01/2015  . Flu Shot  06/30/2017  . Pneumonia vaccines  Completed   Keep follow up appointment with urologist and cardiologist.  Move carefully to avoid falls. Stay active as much as possible.
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
## 2017-07-05 ENCOUNTER — Telehealth: Payer: Self-pay | Admitting: Family Medicine

## 2017-07-05 NOTE — Telephone Encounter (Signed)
Please address what change to make - he received a letter stating his med was affected  Valsartan 160 -   Call in new med - #30 day supply to [place]

## 2017-07-06 MED ORDER — OLMESARTAN MEDOXOMIL 20 MG PO TABS: 20.0000 mg | ORAL_TABLET | Freq: Every day | ORAL | 1 refills | Status: DC

## 2017-07-06 NOTE — Telephone Encounter (Signed)
Rx for Benicar 20mg  sent to replace the Valsartan. Dr Livia Snellen had wrote out his choice of replacement and dosages prior to leaving on vacation. Pt is aware.

## 2017-07-07 ENCOUNTER — Other Ambulatory Visit: Payer: Medicare Other

## 2017-07-07 DIAGNOSIS — I2510 Atherosclerotic heart disease of native coronary artery without angina pectoris: Secondary | ICD-10-CM

## 2017-07-08 ENCOUNTER — Ambulatory Visit: Payer: Medicare Other | Admitting: Family Medicine

## 2017-07-08 LAB — HEPATIC FUNCTION PANEL
ALT: 9 IU/L (ref 0–44)
AST: 15 IU/L (ref 0–40)
Albumin: 4.6 g/dL (ref 3.2–4.6)
Alkaline Phosphatase: 65 IU/L (ref 39–117)
BILIRUBIN TOTAL: 0.5 mg/dL (ref 0.0–1.2)
Bilirubin, Direct: 0.17 mg/dL (ref 0.00–0.40)
Total Protein: 6.7 g/dL (ref 6.0–8.5)

## 2017-07-08 LAB — LIPID PANEL
CHOL/HDL RATIO: 2.8 ratio (ref 0.0–5.0)
Cholesterol, Total: 138 mg/dL (ref 100–199)
HDL: 49 mg/dL (ref >39)
LDL CALC: 67 mg/dL (ref 0–99)
TRIGLYCERIDES: 110 mg/dL (ref 0–149)
VLDL CHOLESTEROL CAL: 22 mg/dL (ref 5–40)

## 2017-07-14 ENCOUNTER — Encounter: Payer: Self-pay | Admitting: Family Medicine

## 2017-07-14 ENCOUNTER — Ambulatory Visit (INDEPENDENT_AMBULATORY_CARE_PROVIDER_SITE_OTHER): Payer: Medicare Other | Admitting: Family Medicine

## 2017-07-14 VITALS — BP 138/61 | HR 73 | Temp 97.3°F | Ht 67.0 in | Wt 133.0 lb

## 2017-07-14 DIAGNOSIS — I10 Essential (primary) hypertension: Secondary | ICD-10-CM

## 2017-07-14 DIAGNOSIS — K2100 Gastro-esophageal reflux disease with esophagitis, without bleeding: Secondary | ICD-10-CM

## 2017-07-14 DIAGNOSIS — R109 Unspecified abdominal pain: Secondary | ICD-10-CM | POA: Diagnosis not present

## 2017-07-14 DIAGNOSIS — E782 Mixed hyperlipidemia: Secondary | ICD-10-CM | POA: Diagnosis not present

## 2017-07-14 DIAGNOSIS — E039 Hypothyroidism, unspecified: Secondary | ICD-10-CM

## 2017-07-14 LAB — URINALYSIS
Bilirubin, UA: NEGATIVE
GLUCOSE, UA: NEGATIVE
KETONES UA: NEGATIVE
Leukocytes, UA: NEGATIVE
NITRITE UA: NEGATIVE
Protein, UA: NEGATIVE
UUROB: 0.2 mg/dL (ref 0.2–1.0)
pH, UA: 6 (ref 5.0–7.5)

## 2017-07-14 MED ORDER — OLMESARTAN MEDOXOMIL 20 MG PO TABS: 20.0000 mg | ORAL_TABLET | Freq: Every day | ORAL | 3 refills | Status: DC

## 2017-07-14 MED ORDER — ALPRAZOLAM 0.5 MG PO TABS
0.5000 mg | ORAL_TABLET | Freq: Every day | ORAL | 5 refills | Status: DC
Start: 2017-07-14 — End: 2018-01-17

## 2017-07-14 NOTE — Progress Notes (Signed)
Subjective:  Patient ID: Albert Allen, male    DOB: 08-02-1923  Age: 83 y.o. MRN: [id number]  CC: Hypothyroidism (pt here today for routine f/u of his chronic medical conditions and also c/o nausea first thing every morning.)   HPI Albert Allen presents for  follow-up of hypertension. Patient has no history of headache chest pain or shortness of breath or recent cough. Patient also denies symptoms of TIA such as numbness weakness lateralizing. Patient checks  blood pressure at home and has not had any elevated readings recently. Patient denies side effects from medication. States taking it regularly. Patient presents for follow-up on  thyroid. The patient has a history of hypothyroidism for many years. It has been stable recently. Pt. denies any change in  voice, loss of hair, heat or cold intolerance. Energy level has been adequate to good. Patient denies constipation and diarrhea. No myxedema. Medication is as noted below. Verified that pt is taking it daily on an empty stomach. Well tolerated.  Patient states that he has been having some lower abdominal pain. This seems to be increased when he ejaculates. He does occasionally practice masturbation. Additionally he is having some a.m. feeling of hot over the back of his body and in his groin area. When this occurs he takes a nitroglycerin with equivocal results. He is not having any problems with headache or dizziness from the nitroglycerin. His concern is that in the past approximately 2 years ago he had similar episode that caused palpitations. The palpitations have not been repeated recently This heat feeling is repeated in his groin with ejaculation. Additionally he wakes up in the morning with nausea frequently. No vomiting. No constipation or diarrhea. History Albert Allen has a past medical history of ABNORMAL CV (STRESS) TEST (03/05/2009); ABUSE, ALCOHOL, IN REMISSION (05/31/2007); Adhesive capsulitis of shoulder (12/29/2007); Alcoholic  polyneuropathy ([place]) (03/29/2008); ANEMIA, B12 DEFICIENCY (03/29/2008); Anxiety state, unspecified (11/19/2008); ASTHMA (05/31/2007); CAD, NATIVE VESSEL (04/04/2009); CARPAL TUNNEL SYNDROME, RIGHT (09/05/2007); Chronic prostatitis (03/29/2008); Degenerative disc disease, cervical; DEGENERATIVE DISC DISEASE, CERVICAL SPINE (06/22/2007); DYSPHAGIA UNSPECIFIED (09/25/2008); Esophageal stricture; GERD (05/31/2007); GOUT (05/31/2007); Hepatomegaly (09/25/2008); Hiatal hernia; HIP PAIN (03/20/2010); History of echocardiogram; HYPERLIPIDEMIA (03/27/2009); HYPERTENSION (05/31/2007); HYPOTHYROIDISM (05/31/2007); LEFT BUNDLE BRANCH BLOCK (02/11/2009); Liver mass, left lobe; LOC OSTEOARTHROS NOT SPEC PRIM/SEC LOWER LEG (03/20/2010); OSTEOARTHRITIS (05/31/2007); PERSONAL HX COLONIC POLYPS (03/05/1997); PSA, INCREASED (03/20/2010); SINUSITIS, CHRONIC NOS (05/31/2007); Sore throat; SYNCOPE (11/19/2008); and WEIGHT LOSS, RECENT (11/19/2008).   He has a past surgical history that includes pul. colapse w/chest tube; Tonsillectomy; carpel tunnel release; ORIF ulnar fracture (11/03/2012); Eye surgery; Inguinal hernia repair (12/18/2015); Inguinal hernia repair (Right, 12/18/2015); and Insertion of mesh (Right, 12/18/2015).   His family history includes Alcohol abuse in his father; Cancer in his brother and mother; Diabetes in his brother, sister, and sister; Lung disease in his father.He reports that he quit smoking about 19 years ago. His smoking use included Cigarettes. He has never used smokeless tobacco. He reports that he drinks about 3.0 oz of alcohol per week . He reports that he does not use drugs.  Current Outpatient Prescriptions on File Prior to Visit  Medication Sig Dispense Refill  . albuterol (PROVENTIL HFA;VENTOLIN HFA) 108 (90 Base) MCG/ACT inhaler Inhale 1-2 puffs into the lungs every 6 (six) hours as needed for wheezing or shortness of breath. 4 Inhaler 3  . aspirin EC 81 MG tablet Take 1 tablet (81 mg total) by mouth daily. 90 tablet 3    . levothyroxine (SYNTHROID, LEVOTHROID) 75 MCG  tablet Take 1 tablet (75 mcg total) by mouth daily before breakfast. 90 tablet 3  . nitroGLYCERIN (NITROSTAT) 0.4 MG SL tablet Place 1 tablet (0.4 mg total) under the tongue every 5 (five) minutes as needed for chest pain. 25 tablet 3  . pantoprazole (PROTONIX) 40 MG tablet Take 1 tablet (40 mg total) by mouth daily. 90 tablet 3  . pravastatin (PRAVACHOL) 40 MG tablet Take 1 tablet (40 mg total) by mouth every evening. 90 tablet 3  . silodosin (RAPAFLO) 4 MG CAPS capsule Take 4 mg by mouth daily with breakfast.     No current facility-administered medications on file prior to visit.     ROS Review of Systems  Constitutional: Negative for chills, diaphoresis, fever and unexpected weight change.  HENT: Negative for congestion, hearing loss, rhinorrhea and sore throat.   Eyes: Negative for visual disturbance.  Respiratory: Negative for cough and shortness of breath.   Cardiovascular: Negative for chest pain, palpitations and leg swelling.  Gastrointestinal: Positive for abdominal pain and nausea. Negative for constipation and diarrhea.  Genitourinary: Negative for dysuria and flank pain.  Musculoskeletal: Negative for arthralgias and joint swelling.  Skin: Negative for rash.  Neurological: Negative for dizziness and headaches.  Psychiatric/Behavioral: Negative for dysphoric mood and sleep disturbance.    Objective:  BP 138/61   Pulse 73   Temp (!) 97.3 F (36.3 C) (Oral)   Ht 5\' 7"  (1.702 m)   Wt 133 lb (60.3 kg)   BMI 20.83 kg/m   BP Readings from Last 3 Encounters:  07/14/17 138/61  07/01/17 (!) 155/66  04/14/17 138/70    Wt Readings from Last 3 Encounters:  07/14/17 133 lb (60.3 kg)  07/01/17 135 lb (61.2 kg)  04/14/17 133 lb 6.4 oz (60.5 kg)     Physical Exam  Constitutional: He is oriented to person, place, and time. He appears well-developed and well-nourished. No distress.  Elderly, slender,  HENT:  Head:  Normocephalic and atraumatic.  Right Ear: External ear normal.  Left Ear: External ear normal.  Nose: Nose normal.  Mouth/Throat: Oropharynx is clear and moist.  Eyes: Pupils are equal, round, and reactive to light. Conjunctivae and EOM are normal.  Neck: Normal range of motion. Neck supple. No thyromegaly present.  Cardiovascular: Normal rate, regular rhythm and normal heart sounds.   No murmur heard. Pulmonary/Chest: Effort normal and breath sounds normal. No respiratory distress. He has no wheezes. He has no rales.  Abdominal: Soft. Bowel sounds are normal. He exhibits no distension. There is no tenderness.  Lymphadenopathy:    He has no cervical adenopathy.  Neurological: He is alert and oriented to person, place, and time. He has normal reflexes.  Skin: Skin is warm and dry.  Psychiatric: He has a normal mood and affect. His behavior is normal. Judgment and thought content normal.     Lab Results  Component Value Date   WBC 2.4 (LL) 01/05/2017   HGB 12.5 (L) 01/05/2017   HCT 37.8 01/05/2017   PLT 173 01/05/2017   GLUCOSE 156 (H) 01/05/2017   CHOL 138 07/07/2017   TRIG 110 07/07/2017   HDL 49 07/07/2017   LDLDIRECT 67.7 01/12/2012   LDLCALC 67 07/07/2017   ALT 9 07/07/2017   AST 15 07/07/2017   NA 139 01/05/2017   K 5.3 (H) 01/05/2017   CL 99 01/05/2017   CREATININE 1.01 01/05/2017   BUN 14 01/05/2017   CO2 21 01/05/2017   TSH 2.660 01/05/2017   PSA 1.42  01/12/2012   INR 0.97 11/03/2012   HGBA1C 6.1% 01/22/2015    Ct Abdomen Pelvis W Contrast  Result Date: 07/21/2016 CLINICAL DATA:  Nausea, lower abdominal pain.  Diverticulosis. EXAM: CT ABDOMEN AND PELVIS WITH CONTRAST TECHNIQUE: Multidetector CT imaging of the abdomen and pelvis was performed using the standard protocol following bolus administration of intravenous contrast. CONTRAST:  177m ISOVUE-300 IOPAMIDOL (ISOVUE-300) INJECTION 61% COMPARISON:  CT 06/19/2014 FINDINGS: Lower chest: Air fill cysts noted  anteriorly within the right middle lobe, stable. No confluent opacities or effusions. Heart is normal size. Small hiatal hernia. Hepatobiliary: Stable hemangiomas in the left hepatic dome. Peripheral puddling of contrast in these hemangiomas. No other focal or new hepatic abnormality. Gallbladder unremarkable. No biliary ductal dilatation. Pancreas: No focal abnormality or ductal dilatation. Spleen: No focal abnormality.  Normal size. Adrenals/Urinary Tract: No adrenal abnormality. No focal renal abnormality. No stones or hydronephrosis. Urinary bladder is unremarkable. Stomach/Bowel: Appendix is normal. Stomach, large and small bowel grossly unremarkable. Vascular/Lymphatic: Diffuse aortic and iliac calcifications. No aneurysm. No adenopathy. Reproductive: Mildly prominent prostate. Other: Small bilateral inguinal hernias containing fat No free fluid or free air. Musculoskeletal: No acute bony abnormality or focal bone lesion. Degenerative changes in the lower lumbar spine. IMPRESSION: No acute findings in the abdomen or pelvis. Electronically Signed   By: KRolm BaptiseM.D.   On: 07/21/2016 16:12    Assessment & Plan:   NMalakiwas seen today for hypothyroidism.  Diagnoses and all orders for this visit:  Hypothyroidism, unspecified type -     Thyroid Panel With TSH  Essential hypertension  Mixed hyperlipidemia  Abdominal pain, unspecified abdominal location -     Urinalysis  Gastroesophageal reflux disease with esophagitis  Other orders -     olmesartan (BENICAR) 20 MG tablet; Take 1 tablet (20 mg total) by mouth daily. -     ALPRAZolam (XANAX) 0.5 MG tablet; Take 1 tablet (0.5 mg total) by mouth at bedtime. -     Cancel: CBC with Differential/Platelet -     Cancel: CMP14+EGFR -     Cancel: Lipid panel   I have discontinued Mr. WZeekvalsartan. I am also having him maintain his albuterol, levothyroxine, pantoprazole, aspirin EC, pravastatin, nitroGLYCERIN, silodosin, olmesartan, and  ALPRAZolam.  Meds ordered this encounter  Medications  . olmesartan (BENICAR) 20 MG tablet    Sig: Take 1 tablet (20 mg total) by mouth daily.    Dispense:  90 tablet    Refill:  3  . ALPRAZolam (XANAX) 0.5 MG tablet    Sig: Take 1 tablet (0.5 mg total) by mouth at bedtime.    Dispense:  30 tablet    Refill:  5    Patient has reported nausea in the abdomen before. CT done one year ago based on this is reported above with highlights showed no acute finding. Follow-up may be appropriate. In the meantime though he's going to see his GI specialist. He is already set up his own appointment. Additionally he plans to see his urologist in the near future as well.  Follow-up: Return in about 3 months (around 10/14/2017).  WClaretta Fraise M.D.

## 2017-07-15 LAB — THYROID PANEL WITH TSH
Free Thyroxine Index: 2.6 (ref 1.2–4.9)
T3 Uptake Ratio: 27 % (ref 24–39)
T4, Total: 9.7 ug/dL (ref 4.5–12.0)
TSH: 1.99 u[IU]/mL (ref 0.450–4.500)

## 2017-07-20 ENCOUNTER — Other Ambulatory Visit: Payer: Self-pay | Admitting: Family Medicine

## 2017-07-29 ENCOUNTER — Telehealth: Payer: Self-pay | Admitting: Family Medicine

## 2017-07-29 NOTE — Telephone Encounter (Signed)
Pt has had headache x 3 days Also has eye irritation and drainage Pt states h/a is some better today Pt will come in for BP check

## 2017-08-23 ENCOUNTER — Ambulatory Visit (INDEPENDENT_AMBULATORY_CARE_PROVIDER_SITE_OTHER): Payer: Medicare Other | Admitting: Family Medicine

## 2017-08-23 ENCOUNTER — Encounter: Payer: Self-pay | Admitting: Family Medicine

## 2017-08-23 ENCOUNTER — Ambulatory Visit (INDEPENDENT_AMBULATORY_CARE_PROVIDER_SITE_OTHER): Payer: Medicare Other

## 2017-08-23 VITALS — BP 135/70 | HR 80 | Temp 97.3°F | Ht 67.0 in | Wt 135.0 lb

## 2017-08-23 DIAGNOSIS — M542 Cervicalgia: Secondary | ICD-10-CM | POA: Diagnosis not present

## 2017-08-23 DIAGNOSIS — R42 Dizziness and giddiness: Secondary | ICD-10-CM | POA: Diagnosis not present

## 2017-08-23 MED ORDER — MELOXICAM 7.5 MG PO TABS: 7.5000 mg | ORAL_TABLET | Freq: Every day | ORAL | 2 refills | Status: DC

## 2017-08-23 NOTE — Progress Notes (Signed)
Subjective:  Patient ID: Albert Allen, male    DOB: Apr 05, 1923  Age: 83 y.o. MRN: [id number]  CC: Dizziness (pt here today c/o dizziness and his neck has pressure in it)   HPI Albert Allen presents for discomfort in the left posterior neck since falling several weeks ago.Dull ache, sharp at times. Intermittent. No association with position noted.v Some light- headedness with frontal HA recently. Less steady on his feet. No syncope. Denies vertigo. No earache, change of hearing. He says eyesight is a little off. He sees flashing light at periphery of vision on occasion  Depression screen [facility] 2/9 08/23/2017 07/14/2017 04/12/2017  Decreased Interest 0 0 0  Down, Depressed, Hopeless 0 0 0  PHQ - 2 Score 0 0 0    History Amani has a past medical history of ABNORMAL CV (STRESS) TEST (03/05/2009); ABUSE, ALCOHOL, IN REMISSION (05/31/2007); Adhesive capsulitis of shoulder (12/29/2007); Alcoholic polyneuropathy ([place]) (03/29/2008); ANEMIA, B12 DEFICIENCY (03/29/2008); Anxiety state, unspecified (11/19/2008); ASTHMA (05/31/2007); CAD, NATIVE VESSEL (04/04/2009); CARPAL TUNNEL SYNDROME, RIGHT (09/05/2007); Chronic prostatitis (03/29/2008); Degenerative disc disease, cervical; DEGENERATIVE DISC DISEASE, CERVICAL SPINE (06/22/2007); DYSPHAGIA UNSPECIFIED (09/25/2008); Esophageal stricture; GERD (05/31/2007); GOUT (05/31/2007); Hepatomegaly (09/25/2008); Hiatal hernia; HIP PAIN (03/20/2010); History of echocardiogram; HYPERLIPIDEMIA (03/27/2009); HYPERTENSION (05/31/2007); HYPOTHYROIDISM (05/31/2007); LEFT BUNDLE BRANCH BLOCK (02/11/2009); Liver mass, left lobe; LOC OSTEOARTHROS NOT SPEC PRIM/SEC LOWER LEG (03/20/2010); OSTEOARTHRITIS (05/31/2007); PERSONAL HX COLONIC POLYPS (03/05/1997); PSA, INCREASED (03/20/2010); SINUSITIS, CHRONIC NOS (05/31/2007); Sore throat; SYNCOPE (11/19/2008); and WEIGHT LOSS, RECENT (11/19/2008).   He has a past surgical history that includes pul. colapse w/chest tube; Tonsillectomy; carpel tunnel release;  ORIF ulnar fracture (11/03/2012); Eye surgery; Inguinal hernia repair (12/18/2015); Inguinal hernia repair (Right, 12/18/2015); and Insertion of mesh (Right, 12/18/2015).   His family history includes Alcohol abuse in his father; Cancer in his brother and mother; Diabetes in his brother, sister, and sister; Lung disease in his father.He reports that he quit smoking about 19 years ago. His smoking use included Cigarettes. He has never used smokeless tobacco. He reports that he drinks about 3.0 oz of alcohol per week . He reports that he does not use drugs.    ROS Review of Systems  Constitutional: Negative for chills, diaphoresis and fever.  HENT: Negative for rhinorrhea and sore throat.   Respiratory: Negative for cough and shortness of breath.   Cardiovascular: Negative for chest pain.  Gastrointestinal: Positive for nausea (some mornings. Will vomit once & be okay for the rest of the day). Negative for abdominal pain.  Musculoskeletal: Negative for arthralgias and myalgias.  Skin: Negative for rash.  Neurological: Positive for dizziness and headaches. Negative for weakness.    Objective:  BP 135/70   Pulse 80   Temp (!) 97.3 F (36.3 C) (Oral)   Ht 5\' 7"  (1.702 m)   Wt 135 lb (61.2 kg)   BMI 21.14 kg/m   BP Readings from Last 3 Encounters:  08/23/17 135/70  07/14/17 138/61  07/01/17 (!) 155/66    Wt Readings from Last 3 Encounters:  08/23/17 135 lb (61.2 kg)  07/14/17 133 lb (60.3 kg)  07/01/17 135 lb (61.2 kg)     Physical Exam  Constitutional: He is oriented to person, place, and time. He appears well-developed and well-nourished. No distress.  HENT:  Head: Normocephalic and atraumatic.  Right Ear: External ear normal.  Left Ear: External ear normal.  Nose: Nose normal.  Mouth/Throat: Oropharynx is clear and moist.  Eyes: Pupils are equal, round, and reactive to light.  Conjunctivae and EOM are normal.  Neck: Normal range of motion. Neck supple. No thyromegaly  present.  Cardiovascular: Normal rate, regular rhythm and normal heart sounds.   No murmur heard. Pulmonary/Chest: Effort normal and breath sounds normal. No respiratory distress. He has no wheezes. He has no rales.  Abdominal: Soft. Bowel sounds are normal. He exhibits no distension. There is no tenderness.  Lymphadenopathy:    He has no cervical adenopathy.  Neurological: He is alert and oriented to person, place, and time. He has normal reflexes.  Skin: Skin is warm and dry.  Psychiatric: He has a normal mood and affect. His behavior is normal. Judgment and thought content normal.   C-spine XR - severe spondylosis and DDD   Assessment & Plan:   Albert Allen was seen today for dizziness.  Diagnoses and all orders for this visit:  Neck pain -     DG Cervical Spine Complete; Future  Dizziness  Other orders -     Cancel: celecoxib (CELEBREX) 200 MG capsule; Take 1 capsule (200 mg total) by mouth 2 (two) times daily. -     meloxicam (MOBIC) 7.5 MG tablet; Take 1 tablet (7.5 mg total) by mouth daily.       I am having Mr. Smoak start on meloxicam. I am also having him maintain his albuterol, levothyroxine, pantoprazole, aspirin EC, pravastatin, nitroGLYCERIN, silodosin, olmesartan, and ALPRAZolam.  Allergies as of 08/23/2017      Reactions   Amoxicillin Swelling, Other (See Comments)   Pt has swelling with PCN's   Penicillins Swelling   Lips swelling Has patient had a PCN reaction causing immediate rash, facial/tongue/throat swelling, SOB or lightheadedness with hypotension: No Has patient had a PCN reaction causing severe rash involving mucus membranes or skin necrosis: No Has patient had a PCN reaction that required hospitalization No Has patient had a PCN reaction occurring within the last 10 years: No If all of the above answers are "NO", then may proceed with Cephalosporin use.   Ceclor [cefaclor] Other (See Comments)   Pt does not remember reaction   Celebrex  [celecoxib] Other (See Comments)   Pt does not remember reaction   Levaquin [levofloxacin In D5w] Other (See Comments)   Pt does not remember reaction      Medication List       Accurate as of 08/23/17  3:22 PM. Always use your most recent med list.          albuterol 108 (90 Base) MCG/ACT inhaler Commonly known as:  PROVENTIL HFA;VENTOLIN HFA Inhale 1-2 puffs into the lungs every 6 (six) hours as needed for wheezing or shortness of breath.   ALPRAZolam 0.5 MG tablet Commonly known as:  XANAX Take 1 tablet (0.5 mg total) by mouth at bedtime.   aspirin EC 81 MG tablet Take 1 tablet (81 mg total) by mouth daily.   levothyroxine 75 MCG tablet Commonly known as:  SYNTHROID, LEVOTHROID Take 1 tablet (75 mcg total) by mouth daily before breakfast.   meloxicam 7.5 MG tablet Commonly known as:  MOBIC Take 1 tablet (7.5 mg total) by mouth daily.   nitroGLYCERIN 0.4 MG SL tablet Commonly known as:  NITROSTAT Place 1 tablet (0.4 mg total) under the tongue every 5 (five) minutes as needed for chest pain.   olmesartan 20 MG tablet Commonly known as:  BENICAR Take 1 tablet (20 mg total) by mouth daily.   pantoprazole 40 MG tablet Commonly known as:  PROTONIX Take 1 tablet (40 mg total)  by mouth daily.   pravastatin 40 MG tablet Commonly known as:  PRAVACHOL Take 1 tablet (40 mg total) by mouth every evening.   silodosin 4 MG Caps capsule Commonly known as:  RAPAFLO Take 4 mg by mouth daily with breakfast.            Discharge Care Instructions        Start     Ordered   08/23/17 0000  DG Cervical Spine Complete    Question Answer Comment  Reason for Exam (SYMPTOM  OR DIAGNOSIS REQUIRED) neck pain   Preferred imaging location? Internal      08/23/17 1334   08/23/17 0000  meloxicam (MOBIC) 7.5 MG tablet  Daily     08/23/17 1437       Follow-up: Return in about 3 months (around 11/22/2017).  [name], M.D.

## 2017-08-27 ENCOUNTER — Encounter: Payer: Self-pay | Admitting: *Deleted

## 2017-09-13 ENCOUNTER — Encounter: Payer: Self-pay | Admitting: Internal Medicine

## 2017-09-13 ENCOUNTER — Ambulatory Visit (INDEPENDENT_AMBULATORY_CARE_PROVIDER_SITE_OTHER): Payer: Medicare Other | Admitting: Internal Medicine

## 2017-09-13 VITALS — BP 108/64 | HR 68 | Ht 66.5 in | Wt 134.0 lb

## 2017-09-13 DIAGNOSIS — K5730 Diverticulosis of large intestine without perforation or abscess without bleeding: Secondary | ICD-10-CM | POA: Diagnosis not present

## 2017-09-13 DIAGNOSIS — N401 Enlarged prostate with lower urinary tract symptoms: Secondary | ICD-10-CM

## 2017-09-13 DIAGNOSIS — K5900 Constipation, unspecified: Secondary | ICD-10-CM | POA: Diagnosis not present

## 2017-09-13 DIAGNOSIS — R1030 Lower abdominal pain, unspecified: Secondary | ICD-10-CM

## 2017-09-13 DIAGNOSIS — R110 Nausea: Secondary | ICD-10-CM | POA: Diagnosis not present

## 2017-09-13 DIAGNOSIS — R3911 Hesitancy of micturition: Secondary | ICD-10-CM | POA: Diagnosis not present

## 2017-09-13 MED ORDER — POLYETHYLENE GLYCOL 3350 17 GM/SCOOP PO POWD: ORAL | 3 refills | Status: DC

## 2017-09-13 NOTE — Progress Notes (Signed)
Subjective:    Patient ID: Albert Allen, male    DOB: Jan 16, 1923, 83 y.o.   MRN: [id number]  HPI Albert Allen is a 83 year old male with a history of GERD with large hiatal hernia, history of duodenal diverticulum, history of colonic diverticulosis, history of BPH, hypothyroidism, hyperlipidemia, alcohol abuse in remission who is seen in follow-up to evaluate daily nausea. He is here today with his son and his wife. He was last seen 3 years ago to evaluate nausea. He had an upper endoscopy in August 2015 showing a 6 cm hiatal hernia and was otherwise normal. He was given a trial of Reglan but developed dizziness and so this was discontinued.  Today he reports that he is having severe "nausea" in his lower abdomen every morning around 5 AM. He reports he goes to bed without nausea, gets up about every 2 hours to urinate again without nausea. Then approximately about 5 AM he develops lower abdominal discomfort. He feels the need to turn over and stretch and it can hurt to lie on his right side. It can become severe and he reports he can "feel like him to die". He can get a little relief with urinating. Discomfort gets better when he gets out of bed and works his way through his morning. He does not vomit. He use a heating pad which seems to help. He's tried Pepto-Bismol. He also occasionally drinks orange juice mixed with a small amount of apple cider vinegar. He also reports that when he attempts to have sex he has pain across his lower abdomen in a similar position to the nausea feeling just prior to ejaculation. This has caused him to avoid intercourse.  He has taken pantoprazole 40 mg daily and without this he has frequent heartburn. On the medication he denies heartburn. Denies dysphagia and odynophagia. Overall he feels that his appetite has been decreased but his weight has been stable. He's having a bowel movement about every other day and at times bowel movements can be hard. He reports he  will have occasionally loose stool followed by hard stool. He is drinking prune juice on occasion to help with bowel movement.  He endorses occasional chest pressure and has tried nitroglycerin. This didn't seem to help. He wonders if this is related to his hiatal hernia.  He also endorses stress and anxiety type feeling. This is primarily related to his wife who is having issues with memory loss and dementia. He is occasionally using a Xanax half a tablet primarily at bedtime which he reports definitively improves his stressed and nervous feeling.  No recent falls that he can occasionally feel that his "balance is off".  His last colonoscopy was performed by Dr. Sharlett Iles in July 2012. This showed moderate diverticulosis in the right colon, external hemorrhoids and hypertrophied anal papillae. There were no polyps found.  Patient has a follow point with Dr. Rosana Hoes, his urologist this afternoon.  Review of Systems As per history of present illness, otherwise negative  Current Medications, Allergies, Past Medical History, Past Surgical History, Family History and Social History were reviewed in [employer] record.     Objective:   Physical Exam BP 108/64   Pulse 68   Ht 5' 6.5" (1.689 m)   Wt 134 lb (60.8 kg)   BMI 21.30 kg/m  Constitutional: Well-developed and well-nourished. No distress. HEENT: Normocephalic and atraumatic. Conjunctivae are normal.  No scleral icterus. Neck: Neck supple. Trachea midline. Cardiovascular: Normal rate, regular rhythm and  intact distal pulses. No M/R/G Pulmonary/chest: Effort normal and breath sounds normal. No wheezing, rales or rhonchi. Abdominal: Soft, Mild diffuse tenderness lower greater than upper abdomen without rebound or guarding, nondistended. Bowel sounds active throughout. There are no masses palpable. No hepatosplenomegaly. Extremities: no clubbing, cyanosis, or edema Neurological: Alert and oriented to person place  and time. Skin: Skin is warm and dry. Psychiatric: Normal mood and affect. Behavior is normal.  CBC    Component Value Date/Time   WBC 2.4 (LL) 01/05/2017 0842   WBC 2.5 (L) 07/01/2016 1042   RBC 3.71 (L) 01/05/2017 0842   RBC 3.80 (L) 07/01/2016 1042   HGB 12.5 (L) 01/05/2017 0842   HCT 37.8 01/05/2017 0842   PLT 173 01/05/2017 0842   MCV 102 (H) 01/05/2017 0842   MCH 33.7 (H) 01/05/2017 0842   MCH 34.2 (H) 07/01/2016 1042   MCHC 33.1 01/05/2017 0842   MCHC 34.9 07/01/2016 1042   RDW 13.8 01/05/2017 0842   LYMPHSABS 1.0 01/05/2017 0842   MONOABS 0.7 09/14/2014 0913   EOSABS 0.0 01/05/2017 0842   BASOSABS 0.0 01/05/2017 0842   CMP     Component Value Date/Time   NA 139 01/05/2017 0842   K 5.3 (H) 01/05/2017 0842   CL 99 01/05/2017 0842   CO2 21 01/05/2017 0842   GLUCOSE 156 (H) 01/05/2017 0842   GLUCOSE 150 (H) 07/01/2016 1042   BUN 14 01/05/2017 0842   CREATININE 1.01 01/05/2017 0842   CREATININE 0.92 05/01/2013 1059   CALCIUM 9.7 01/05/2017 0842   PROT 6.7 07/07/2017 0858   ALBUMIN 4.6 07/07/2017 0858   AST 15 07/07/2017 0858   ALT 9 07/07/2017 0858   ALKPHOS 65 07/07/2017 0858   BILITOT 0.5 07/07/2017 0858   GFRNONAA 65 01/05/2017 0842   GFRNONAA 74 05/01/2013 1059   GFRAA 75 01/05/2017 0842   GFRAA 86 05/01/2013 1059   CT ABDOMEN AND PELVIS WITH CONTRAST   TECHNIQUE: Multidetector CT imaging of the abdomen and pelvis was performed using the standard protocol following bolus administration of intravenous contrast.   CONTRAST:  143mL OMNIPAQUE IOHEXOL 300 MG/ML  SOLN   COMPARISON:  None.   FINDINGS: Sagittal images of the spine shows diffuse osteopenia. Disc space flattening with mild posterior spurring and vacuum disc phenomenon noted at L3-L4 level. Significant disc space flattening with endplate sclerotic changes and vacuum disc phenomenon at L5-S1 level. Mild compression deformity upper endplate of L1 vertebral body of indeterminate age.  Emphysematous changes/bulla noted right base anteriorly.   There is moderate size hiatal hernia. A hemangioma in left hepatic dome measures 3.3 cm. Second hemangioma in lateral left hepatic lobe measures 3 cm. No calcified gallstones are noted within gallbladder.   Atherosclerotic calcifications and atherosclerotic plaques noted abdominal aorta and iliac arteries. A posterior wall somewhat exophytic atherosclerotic plaque noted in distal abdominal aorta axial image 34 and sagittal image 57. Measures about 1 cm. No evidence of aortic leak or obstruction.   There is a distal duodenal diverticulum just anterior to abdominal aorta measures about 2.4 cm.   The pancreas, spleen and adrenal glands are unremarkable. Kidneys are symmetrical in size and enhancement. No hydronephrosis or hydroureter.   Delayed renal images shows bilateral renal symmetrical excretion.   No small bowel obstruction.  No ascites or free air.  No adenopathy.   There are diverticula in right colon. Minimal thickening of colonic wall in the region of colonic diverticula probable due to colonic diverticulosis. No definite evidence of acute  diverticulitis. A normal appendix is clearly visualized in axial image 46. The transverse colon was is empty partially collapsed. No colonic obstruction. Scattered diverticula are noted descending colon. No evidence of acute diverticulitis. There is some gas noted in sigmoid colon. Moderate stool noted within rectum. The rectum measures 5.9 cm in diameter suspicious for mild fecal impaction. Moderate distended urinary bladder. No bladder filling defects. Prostate gland measures 5.1 x 3.3 cm. Bilateral inguinal scrotal canal small hernia containing fat without evidence of acute complication. No destructive bony lesions are noted within pelvis.   IMPRESSION: 1. Moderate size hiatal hernia is noted. 2. Hemangiomas noted in left hepatic lobe the largest measures 3.3 cm. 3. No  hydronephrosis or hydroureter. 4. Extensive atherosclerotic plaques and calcifications of abdominal aorta and iliac arteries. Somewhat exophytic posterior wall atherosclerotic plaque distal abdominal aorta measures about 1 cm. No evidence of leak or aortic obstruction. 5. Normal appendix. 6. Right colon diverticula are noted. Mild thickening of right colonic wall probable due to chronic diverticulosis. No definite evidence of acute diverticulitis. 7. Moderate stool noted within rectum. Mild fecal impaction cannot be excluded. 8. Mild enlarged prostate gland. 9. Moderate distended urinary bladder. 10. A distal duodenal diverticulum measures about 2.4 Cm without evidence of acute complication.     Electronically Signed   By: Lahoma Crocker M.D.   On: 06/19/2014 13:42     CT ABDOMEN AND PELVIS WITH CONTRAST   TECHNIQUE: Multidetector CT imaging of the abdomen and pelvis was performed using the standard protocol following bolus administration of intravenous contrast.   CONTRAST:  180mL ISOVUE-300 IOPAMIDOL (ISOVUE-300) INJECTION 61%   COMPARISON:  CT 06/19/2014   FINDINGS: Lower chest: Air fill cysts noted anteriorly within the right middle lobe, stable. No confluent opacities or effusions. Heart is normal size. Small hiatal hernia.   Hepatobiliary: Stable hemangiomas in the left hepatic dome. Peripheral puddling of contrast in these hemangiomas. No other focal or new hepatic abnormality. Gallbladder unremarkable. No biliary ductal dilatation.   Pancreas: No focal abnormality or ductal dilatation.   Spleen: No focal abnormality.  Normal size.   Adrenals/Urinary Tract: No adrenal abnormality. No focal renal abnormality. No stones or hydronephrosis. Urinary bladder is unremarkable.   Stomach/Bowel: Appendix is normal. Stomach, large and small bowel grossly unremarkable.   Vascular/Lymphatic: Diffuse aortic and iliac calcifications. No aneurysm. No adenopathy.     Reproductive: Mildly prominent prostate.   Other: Small bilateral inguinal hernias containing fat No free fluid or free air.   Musculoskeletal: No acute bony abnormality or focal bone lesion. Degenerative changes in the lower lumbar spine.   IMPRESSION: No acute findings in the abdomen or pelvis.     Electronically Signed   By: Rolm Baptise M.D.   On: 07/21/2016 16:12      Assessment & Plan:  83 year old male with a history of GERD with large hiatal hernia, history of duodenal diverticulum, history of colonic diverticulosis, history of BPH, hypothyroidism, hyperlipidemia, alcohol abuse in remission who is seen in follow-up to evaluate daily nausea. He is here today with his son and his wife.  1. "Nausea"/lower abdominal discomfort -- the nausea that he is describing is inferior to his umbilicus in felt to be related to either colonic spasm related to constipation or perhaps urinary retention and bladder spasm related to prostate pathology. It does not sound as if he is having nausea related to dyspepsia or gastric/acid peptic issues. We spent considerable time today discussing his symptoms and treatment. I also reviewed prior  CT scans performed in 2015 when I last evaluated him but also again in August 2017. These did not reveal overt GI pathology however constipation was suggested in 2015. He has known diverticulosis but did not have diverticulitis --Trial of MiraLAX 17 g daily to help with constipation. I feel that if he is going more frequently and better evacuating his stool that his symptoms will improve. --At this point I do not feel that colonoscopy would provide additional diagnostic information at present. --Urology evaluation today. I encouraged he and his son to discuss urinary retention and enlarged prostate contributing to his lower abdominal discomfort which may in fact be urinary and also the pain that he is feeling with ejaculation.  2. GERD/hiatal hernia -- control with  pantoprazole. We'll continue pantoprazole 40 mg daily.  45 minutes spent with the patient today. Greater than 50% was spent in counseling and coordination of care with the patient

## 2017-09-13 NOTE — Patient Instructions (Addendum)
We have sent the following medications to your pharmacy for you to pick up at your convenience: Miralax 17 grams daily  Continue pantoprazole daily  Please follow up with Dr Hilarie Fredrickson 11/16/17 @ 9:00 am.  If you are age 81 or older, your body mass index should be between 23-30. Your Body mass index is 21.3 kg/m. If this is out of the aforementioned range listed, please consider follow up with your Primary Care Provider.  If you are age 72 or younger, your body mass index should be between 19-25. Your Body mass index is 21.3 kg/m. If this is out of the aformentioned range listed, please consider follow up with your Primary Care Provider.

## 2017-09-15 ENCOUNTER — Ambulatory Visit (INDEPENDENT_AMBULATORY_CARE_PROVIDER_SITE_OTHER): Payer: Medicare Other | Admitting: *Deleted

## 2017-09-15 DIAGNOSIS — Z23 Encounter for immunization: Secondary | ICD-10-CM | POA: Diagnosis not present

## 2017-09-22 ENCOUNTER — Ambulatory Visit (INDEPENDENT_AMBULATORY_CARE_PROVIDER_SITE_OTHER): Payer: Medicare Other | Admitting: Family Medicine

## 2017-09-22 ENCOUNTER — Encounter: Payer: Self-pay | Admitting: Family Medicine

## 2017-09-22 VITALS — BP 135/76 | HR 88 | Temp 98.0°F | Ht 66.5 in | Wt 136.0 lb

## 2017-09-22 DIAGNOSIS — R519 Headache, unspecified: Secondary | ICD-10-CM

## 2017-09-22 DIAGNOSIS — H8149 Vertigo of central origin, unspecified ear: Secondary | ICD-10-CM | POA: Diagnosis not present

## 2017-09-22 DIAGNOSIS — H814 Vertigo of central origin: Secondary | ICD-10-CM

## 2017-09-22 DIAGNOSIS — G4489 Other headache syndrome: Secondary | ICD-10-CM

## 2017-09-22 DIAGNOSIS — R51 Headache: Secondary | ICD-10-CM | POA: Diagnosis not present

## 2017-09-22 DIAGNOSIS — R42 Dizziness and giddiness: Secondary | ICD-10-CM

## 2017-09-22 MED ORDER — MECLIZINE HCL 25 MG PO TABS: 25.0000 mg | ORAL_TABLET | Freq: Three times a day (TID) | ORAL | 0 refills | Status: DC | PRN

## 2017-09-22 NOTE — Progress Notes (Signed)
BP 135/76   Pulse 88   Temp 98 F (36.7 C) (Oral)   Ht 5' 6.5" (1.689 m)   Wt 136 lb (61.7 kg)   BMI 21.62 kg/m    Subjective:    Patient ID: Albert Allen, male    DOB: Oct 05, 1923, 83 y.o.   MRN: [id number]  HPI: Albert Allen is a 83 y.o. male presenting on 09/22/2017 for Dizziness, ears ringing (when he leans forward or sideways was seen by Dr. Livia Snellen on 9/24 for dizziness)   HPI Dizziness and headache and ringing in ears Patient complains of dizziness and headaches and ringing in his ears that has been worsening over the past month.  He was seen on 08/23/2017 by another provider here in our office for the dizziness with the headaches are new.  He does not complain of a headache right now and from her office today but he has been having them very frequently and almost a few times per day.  The headaches are short lived.  The dizziness is something that he feels more often and last longer throughout the day.  He feels like there is a spinning sensation especially if he turns his head to one direction or if he tries to sit up or get up quickly.  He does not feel lightheaded or like he is going to pass out but feels more like there is a spinning sensation.  He has not used any medication for it to this point.  Relevant past medical, surgical, family and social history reviewed and updated as indicated. Interim medical history since our last visit reviewed. Allergies and medications reviewed and updated.  Review of Systems  Constitutional: Negative for chills and fever.  HENT: Positive for congestion and tinnitus. Negative for ear discharge, ear pain, sinus pain, sinus pressure and sore throat.   Respiratory: Negative for cough, shortness of breath and wheezing.   Cardiovascular: Negative for chest pain and leg swelling.  Musculoskeletal: Negative for back pain and gait problem.  Skin: Negative for rash.  Neurological: Positive for dizziness and headaches.  All other systems  reviewed and are negative.   Per HPI unless specifically indicated above     Objective:    BP 135/76   Pulse 88   Temp 98 F (36.7 C) (Oral)   Ht 5' 6.5" (1.689 m)   Wt 136 lb (61.7 kg)   BMI 21.62 kg/m   Wt Readings from Last 3 Encounters:  09/22/17 136 lb (61.7 kg)  09/13/17 134 lb (60.8 kg)  08/23/17 135 lb (61.2 kg)    Physical Exam  Constitutional: He is oriented to person, place, and time. He appears well-developed and well-nourished. No distress.  HENT:  Right Ear: Tympanic membrane, external ear and ear canal normal.  Left Ear: Tympanic membrane, external ear and ear canal normal.  Nose: Nose normal. Right sinus exhibits no maxillary sinus tenderness and no frontal sinus tenderness. Left sinus exhibits no maxillary sinus tenderness and no frontal sinus tenderness.  Mouth/Throat: Oropharynx is clear and moist. No oropharyngeal exudate.  Eyes: Conjunctivae are normal. No scleral icterus.  Neck: Neck supple. No thyromegaly present.  Cardiovascular: Normal rate, regular rhythm, normal heart sounds and intact distal pulses.   No murmur heard. Pulmonary/Chest: Effort normal and breath sounds normal. No respiratory distress. He has no wheezes. He has no rales.  Musculoskeletal: Normal range of motion. He exhibits no edema.  Lymphadenopathy:    He has no cervical adenopathy.  Neurological: He is  alert and oriented to person, place, and time. He has normal strength. No cranial nerve deficit or sensory deficit. Coordination normal.  Skin: Skin is warm and dry. No rash noted. He is not diaphoretic.  Psychiatric: He has a normal mood and affect. His behavior is normal.  Nursing note and vitals reviewed.       Assessment & Plan:   Problem List Items Addressed This Visit    None    Visit Diagnoses    Vertigo    -  Primary   Relevant Orders   MR Brain Wo Contrast   Bilateral headaches       Relevant Orders   MR Brain Wo Contrast   Other headache syndrome        Relevant Orders   MR Brain Wo Contrast   Vertigo of central origin, unspecified laterality       Relevant Orders   MR Brain Wo Contrast       Follow up plan: Return if symptoms worsen or fail to improve.  Counseling provided for all of the vaccine components Orders Placed This Encounter  Procedures  . MR Brain Wo Contrast    Caryl Pina, MD [facility] 09/22/2017, 10:33 AM

## 2017-09-23 ENCOUNTER — Telehealth: Payer: Self-pay | Admitting: Family Medicine

## 2017-09-23 ENCOUNTER — Other Ambulatory Visit: Payer: Self-pay | Admitting: *Deleted

## 2017-09-23 DIAGNOSIS — R51 Headache: Secondary | ICD-10-CM

## 2017-09-23 DIAGNOSIS — H814 Vertigo of central origin: Secondary | ICD-10-CM

## 2017-09-23 DIAGNOSIS — G4489 Other headache syndrome: Secondary | ICD-10-CM

## 2017-09-23 DIAGNOSIS — R42 Dizziness and giddiness: Secondary | ICD-10-CM

## 2017-09-23 DIAGNOSIS — R519 Headache, unspecified: Secondary | ICD-10-CM

## 2017-09-23 MED ORDER — MECLIZINE HCL 25 MG PO TABS: 25.0000 mg | ORAL_TABLET | Freq: Three times a day (TID) | ORAL | 0 refills | Status: DC | PRN

## 2017-10-01 ENCOUNTER — Telehealth: Payer: Self-pay | Admitting: Family Medicine

## 2017-10-01 ENCOUNTER — Ambulatory Visit (HOSPITAL_COMMUNITY)
Admission: RE | Admit: 2017-10-01 | Discharge: 2017-10-01 | Disposition: A | Discharge status: Home / Self Care | Payer: Medicare Other | Source: Ambulatory Visit | Attending: Family Medicine | Admitting: Family Medicine

## 2017-10-01 DIAGNOSIS — R51 Headache: Secondary | ICD-10-CM

## 2017-10-01 DIAGNOSIS — H814 Vertigo of central origin: Secondary | ICD-10-CM

## 2017-10-01 DIAGNOSIS — H8149 Vertigo of central origin, unspecified ear: Secondary | ICD-10-CM | POA: Diagnosis not present

## 2017-10-01 DIAGNOSIS — G4489 Other headache syndrome: Secondary | ICD-10-CM | POA: Diagnosis not present

## 2017-10-01 DIAGNOSIS — R519 Headache, unspecified: Secondary | ICD-10-CM

## 2017-10-01 DIAGNOSIS — R42 Dizziness and giddiness: Secondary | ICD-10-CM | POA: Diagnosis not present

## 2017-10-01 NOTE — Telephone Encounter (Signed)
Spoke with patient.

## 2017-10-13 ENCOUNTER — Telehealth: Payer: Self-pay | Admitting: Internal Medicine

## 2017-10-13 NOTE — Telephone Encounter (Signed)
Spoke with pts son and he states that the miralax has helped his father's nausea symptoms. Reports the symptoms are less severe. Pt has OV scheduled in December and they will keep this appt. Dr. Hilarie Fredrickson notified.

## 2017-10-14 NOTE — Progress Notes (Signed)
Chief Complaint  Patient presents with  . Follow-up    CAD    History of Present Illness: 83 yo male with past medical history of CAD, HTN, anemia, asthma/COPD, GERD with hiatal hernia, alcohol abuse in remission and hypothyroidism here today for cardiac follow up. Cardiac cath April 2010 showed a 70-80% heavily calcified stenosis in the moderate caliber mid LAD. The LAD was not felt to be be a good vessel for PCI. There was diffuse moderately obstructive disease in the other vessels. He has done well with medical management of his CAD since 2010. Vascular screening at [facility] October 2015 with mild bilateral carotid artery disease, normal ABI.  Echo 03/18/16 with normal LV function, mild LVH, trivial AI, mild MR. He has had atypical chest pain for years which has been felt to be related to GERD and anxiety. His ASA was stopped due to GI issues and his statin was also stopped.   He is here today for follow up. The patient denies any chest pain, dyspnea, palpitations, lower extremity edema, orthopnea, PND, dizziness, near syncope or syncope. He continues to have abdominal pain. He is followed in GI for now. He is very active. No exertional chest pain.   Primary Care Physician: [name], MD   Past Medical History:  Diagnosis Date  . ABNORMAL CV (STRESS) TEST 03/05/2009  . ABUSE, ALCOHOL, IN REMISSION 05/31/2007  . Adhesive capsulitis of shoulder 12/29/2007  . Alcoholic polyneuropathy ([place]) 03/29/2008  . ANEMIA, B12 DEFICIENCY 03/29/2008  . Anxiety state, unspecified 11/19/2008  . ASTHMA 05/31/2007  . CAD, NATIVE VESSEL 04/04/2009  . CARPAL TUNNEL SYNDROME, RIGHT 09/05/2007  . Chronic prostatitis 03/29/2008  . Degenerative disc disease, cervical   . DEGENERATIVE DISC DISEASE, CERVICAL SPINE 06/22/2007  . Diverticulosis   . DYSPHAGIA UNSPECIFIED 09/25/2008  . Esophageal stricture   . External hemorrhoids   . GERD 05/31/2007  . GOUT 05/31/2007  . Hepatomegaly 09/25/2008  . Hiatal hernia    . HIP PAIN 03/20/2010  . History of echocardiogram    Echo 4/17 - mild LVH, EF 50-55%, Gr 1 DD, trivial AI, MAC, mild MR  . HYPERLIPIDEMIA 03/27/2009  . HYPERTENSION 05/31/2007  . HYPOTHYROIDISM 05/31/2007  . LEFT BUNDLE BRANCH BLOCK 02/11/2009  . Liver mass, left lobe   . LOC OSTEOARTHROS NOT SPEC PRIM/SEC LOWER LEG 03/20/2010  . OSTEOARTHRITIS 05/31/2007  . PERSONAL HX COLONIC POLYPS 03/05/1997   adenomatous   . PSA, INCREASED 03/20/2010  . SINUSITIS, CHRONIC NOS 05/31/2007  . Sore throat    CURRENTLY  . SYNCOPE 11/19/2008  . WEIGHT LOSS, RECENT 11/19/2008    Past Surgical History:  Procedure Laterality Date  . carpel tunnel release    . EYE SURGERY    . INGUINAL HERNIA REPAIR  12/18/2015  . INSERTION OF MESH Right 12/18/2015   Performed by Greer Pickerel, MD at [place]  . OPEN REDUCTION INTERNAL FIXATION (ORIF) ULNAR FRACTURE Right 11/03/2012   Performed by Hessie Dibble, MD at [facility]  . OPEN REPAIR RIGHT INGUINAL HERNIA Right 12/18/2015   Performed by Greer Pickerel, MD at [place]. colapse w/chest tube    . TONSILLECTOMY      Current Outpatient Medications  Medication Sig Dispense Refill  . albuterol (PROVENTIL HFA;VENTOLIN HFA) 108 (90 Base) MCG/ACT inhaler Inhale 1-2 puffs into the lungs every 6 (six) hours as needed for wheezing or shortness of breath. 4 Inhaler 3  . ALPRAZolam (XANAX) 0.5 MG tablet  Take 1 tablet (0.5 mg total) by mouth at bedtime. 30 tablet 5  . aspirin EC 81 MG tablet Take 1 tablet (81 mg total) by mouth daily. 90 tablet 3  . levothyroxine (SYNTHROID, LEVOTHROID) 75 MCG tablet Take 1 tablet (75 mcg total) by mouth daily before breakfast. 90 tablet 3  . olmesartan (BENICAR) 20 MG tablet Take 1 tablet (20 mg total) by mouth daily. 90 tablet 3  . pantoprazole (PROTONIX) 40 MG tablet Take 1 tablet (40 mg total) by mouth daily. 90 tablet 3  . polyethylene glycol powder (GLYCOLAX/MIRALAX) powder Take 17 grams (1 capful) dissolved in at least 8 ounces water/juice once  daily 527 g 3  . silodosin (RAPAFLO) 4 MG CAPS capsule Take 4 mg by mouth daily with breakfast.    . nitroGLYCERIN (NITROSTAT) 0.4 MG SL tablet Place 1 tablet (0.4 mg total) under the tongue every 5 (five) minutes as needed for chest pain. 25 tablet 3  . pravastatin (PRAVACHOL) 40 MG tablet Take 1 tablet (40 mg total) by mouth every evening. 90 tablet 3   No current facility-administered medications for this visit.     Allergies  Allergen Reactions  . Amoxicillin Swelling and Other (See Comments)    Pt has swelling with PCN's  . Penicillins Swelling    Lips swelling Has patient had a PCN reaction causing immediate rash, facial/tongue/throat swelling, SOB or lightheadedness with hypotension: No Has patient had a PCN reaction causing severe rash involving mucus membranes or skin necrosis: No Has patient had a PCN reaction that required hospitalization No Has patient had a PCN reaction occurring within the last 10 years: No If all of the above answers are "NO", then may proceed with Cephalosporin use.  Blair Dolphin [Cefaclor] Other (See Comments)    Pt does not remember reaction  . Celebrex [Celecoxib] Other (See Comments)    Pt does not remember reaction  . Levaquin [Levofloxacin In D5w] Other (See Comments)    Pt does not remember reaction    Social History   Socioeconomic History  . Marital status: Married    Spouse name: Not on file  . Number of children: Not on file  . Years of education: Not on file  . Highest education level: Not on file  Social Needs  . Financial resource strain: Not on file  . Food insecurity - worry: Not on file  . Food insecurity - inability: Not on file  . Transportation needs - medical: Not on file  . Transportation needs - non-medical: Not on file  Occupational History  . Occupation: retired    Comment: Oncologist  Tobacco Use  . Smoking status: Former Smoker    Types: Cigarettes    Last attempt to quit: 04/24/1998    Years since quitting:  19.4  . Smokeless tobacco: Never Used  Substance and Sexual Activity  . Alcohol use: Yes    Alcohol/week: 3.0 oz    Types: 5 Shots of liquor per week    Comment: Does not drink weekly but does buy a pint of liqour occasionally and will drink most of the bottle when he does  . Drug use: No  . Sexual activity: Yes  Other Topics Concern  . Not on file  Social History Narrative  . Not on file    Family History  Problem Relation Age of Onset  . Cancer Mother        In back but unsure of what type of cancer  . Alcohol abuse  Father   . Lung disease Father        abscess  . Diabetes Sister   . Cancer Brother        throat  . Diabetes Brother   . Diabetes Sister   . Colon cancer Neg Hx     Review of Systems:  As stated in the HPI and otherwise negative.   BP 140/60   Pulse 76   Ht _0  (1.702 m)   Wt 136 lb 12.8 oz (62.1 kg)   SpO2 97%   BMI 21.43 kg/m   Physical Examination:  General: Well developed, well nourished, NAD  HEENT: OP clear, mucus membranes moist  SKIN: warm, dry. No rashes. Neuro: No focal deficits  Musculoskeletal: Muscle strength 5/5 all ext  Psychiatric: Mood and affect normal  Neck: No JVD, no carotid bruits, no thyromegaly, no lymphadenopathy.  Lungs:Clear bilaterally, no wheezes, rhonci, crackles Cardiovascular: Regular rate and rhythm. No murmurs, gallops or rubs. Abdomen:Soft. Bowel sounds present. Non-tender.  Extremities: No lower extremity edema. Pulses are 2 + in the bilateral DP/PT.  Echo 03/18/16: Left ventricle: Abnormal septal motion The cavity size was mildly   dilated. Wall thickness was increased in a pattern of mild LVH.   Systolic function was normal. The estimated ejection fraction was   in the range of 50% to 55%. Doppler parameters are consistent   with abnormal left ventricular relaxation (grade 1 diastolic   dysfunction). - Aortic valve: There was trivial regurgitation. - Mitral valve: Calcified annulus. Mildly thickened  leaflets .   There was mild regurgitation. - Atrial septum: No defect or patent foramen ovale was identified.  EKG:  EKG is not ordered today. The ekg ordered today demonstrates   Recent Labs: 01/05/2017: BUN 14; Creatinine, Ser 1.01; Hemoglobin 12.5; Platelets 173; Potassium 5.3; Sodium 139 07/07/2017: ALT 9 07/14/2017: TSH 1.990   Lipid Panel    Component Value Date/Time   CHOL 138 07/07/2017 0858   CHOL 177 05/01/2013 1059   TRIG 110 07/07/2017 0858   TRIG 196 (H) 05/01/2013 1059   HDL 49 07/07/2017 0858   HDL 45 05/01/2013 1059   CHOLHDL 2.8 07/07/2017 0858   CHOLHDL 4 09/22/2012 1051   VLDL 35.2 09/22/2012 1051   LDLCALC 67 07/07/2017 0858   LDLCALC 93 05/01/2013 1059   LDLDIRECT 67.7 01/12/2012 0823     Wt Readings from Last 3 Encounters:  10/15/17 136 lb 12.8 oz (62.1 kg)  09/22/17 136 lb (61.7 kg)  09/13/17 134 lb (60.8 kg)     Other studies Reviewed: Additional studies/ records that were reviewed today include: . Review of the above records demonstrates:    Assessment and Plan:   1. CAD with stable angina: He has no change in his chronic angina. Will continue ASA and statin. No beta blocker given bradycardia.  He has NTG to use prn but rarely uses.   2. HTN:  BP is controlled. No changes.   3. LBBB: Chronic  Current medicines are reviewed at length with the patient today.  The patient does not have concerns regarding medicines.  The following changes have been made:  no change  Labs/ tests ordered today include:   No orders of the defined types were placed in this encounter.   Disposition:   FU with me in 12 months  Signed, Lauree Chandler, MD 10/15/2017 10:56 AM    [facility] [address], [place], [place]  [address] Phone: [phone]; Fax: [phone]

## 2017-10-15 ENCOUNTER — Ambulatory Visit: Payer: Medicare Other | Admitting: Cardiovascular Disease

## 2017-10-15 ENCOUNTER — Encounter: Payer: Self-pay | Admitting: Cardiovascular Disease

## 2017-10-15 VITALS — BP 140/60 | HR 76 | Ht 67.0 in | Wt 136.8 lb

## 2017-10-15 DIAGNOSIS — I2510 Atherosclerotic heart disease of native coronary artery without angina pectoris: Secondary | ICD-10-CM

## 2017-10-15 DIAGNOSIS — I10 Essential (primary) hypertension: Secondary | ICD-10-CM | POA: Diagnosis not present

## 2017-10-15 NOTE — Patient Instructions (Signed)
Medication Instructions:  Your physician recommends that you continue on your current medications as directed. Please refer to the Current Medication list given to you today.   Labwork: none  Testing/Procedures: None   Follow-Up: Your physician recommends that you schedule a follow-up appointment in: 12 months. Please call our office in about 9 months to schedule this appointment    Any Other Special Instructions Will Be Listed Below (If Applicable).     If you need a refill on your cardiac medications before your next appointment, please call your pharmacy.

## 2017-10-18 ENCOUNTER — Encounter: Payer: Self-pay | Admitting: Family Medicine

## 2017-10-18 ENCOUNTER — Ambulatory Visit (INDEPENDENT_AMBULATORY_CARE_PROVIDER_SITE_OTHER): Payer: Medicare Other | Admitting: Family Medicine

## 2017-10-18 VITALS — BP 156/65 | HR 88 | Temp 97.3°F | Ht 67.0 in | Wt 136.0 lb

## 2017-10-18 DIAGNOSIS — F5104 Psychophysiologic insomnia: Secondary | ICD-10-CM

## 2017-10-18 DIAGNOSIS — J209 Acute bronchitis, unspecified: Secondary | ICD-10-CM

## 2017-10-18 DIAGNOSIS — I10 Essential (primary) hypertension: Secondary | ICD-10-CM

## 2017-10-18 DIAGNOSIS — J206 Acute bronchitis due to rhinovirus: Secondary | ICD-10-CM

## 2017-10-18 DIAGNOSIS — K2100 Gastro-esophageal reflux disease with esophagitis, without bleeding: Secondary | ICD-10-CM

## 2017-10-18 MED ORDER — TRAZODONE HCL 50 MG PO TABS: 50.0000 mg | ORAL_TABLET | Freq: Every evening | ORAL | 2 refills | Status: DC | PRN

## 2017-10-18 NOTE — Patient Instructions (Signed)
Call us if your blood pressure runs greater than 140/90.  Hope you have a great Holiday Season!

## 2017-10-18 NOTE — Progress Notes (Signed)
Subjective:  Patient ID: Albert Allen, male    DOB: 12-05-1922  Age: 83 y.o. MRN: [id number]  CC: Hypothyroidism (pt here today for routine follow up of his chronic medical conditions.)   HPI HARPREET POMPEY presents for  follow-up of hypertension. Patient has no history of headache chest pain or shortness of breath or recent cough. Patient also denies symptoms of TIA such as focal numbness or weakness. Patient checks  blood pressure at home. Readings recently pretty good at home is good at home. Patient denies side effects from medication. States taking it regularly.  Patient having more problems with his nerves.  His wife's dementia has had a significant impact on him.  She is requiring more care.  It is hard for him to perform the tasks that she needs.  Other family matters have impacted him as well.  Additionally the patient reports to me that his hiatal hernia has been acting up but it is better now that he is back on his pantoprazole regularly.  Additionally he was concerned that the Benicar is causing some constipation.  However, he has been taking some medicine for constipation and he has resolved that problem without changing the Benicar.  Primarily he is using some MiraLAX prescribed by his GI doctor.  Meanwhile his blood pressure was significantly elevated today.  He thinks that was related to his nerves.  He has had good blood pressure readings at home and in the high ones here he feels are related only to feeling stressed when he arrived.  He will continue to monitor readings at home.   History Surafel has a past medical history of ABNORMAL CV (STRESS) TEST (03/05/2009), ABUSE, ALCOHOL, IN REMISSION (05/31/2007), Adhesive capsulitis of shoulder (03/08/8118), Alcoholic polyneuropathy ([place]) (03/29/2008), ANEMIA, B12 DEFICIENCY (03/29/2008), Anxiety state, unspecified (11/19/2008), ASTHMA (05/31/2007), CAD, NATIVE VESSEL (04/04/2009), CARPAL TUNNEL SYNDROME, RIGHT (09/05/2007), Chronic prostatitis  (03/29/2008), Degenerative disc disease, cervical, DEGENERATIVE DISC DISEASE, CERVICAL SPINE (06/22/2007), Diverticulosis, DYSPHAGIA UNSPECIFIED (09/25/2008), Esophageal stricture, External hemorrhoids, GERD (05/31/2007), GOUT (05/31/2007), Hepatomegaly (09/25/2008), Hiatal hernia, HIP PAIN (03/20/2010), History of echocardiogram, HYPERLIPIDEMIA (03/27/2009), HYPERTENSION (05/31/2007), HYPOTHYROIDISM (05/31/2007), LEFT BUNDLE BRANCH BLOCK (02/11/2009), Liver mass, left lobe, LOC OSTEOARTHROS NOT SPEC PRIM/SEC LOWER LEG (03/20/2010), OSTEOARTHRITIS (05/31/2007), PERSONAL HX COLONIC POLYPS (03/05/1997), PSA, INCREASED (03/20/2010), SINUSITIS, CHRONIC NOS (05/31/2007), Sore throat, SYNCOPE (11/19/2008), and WEIGHT LOSS, RECENT (11/19/2008).   He has a past surgical history that includes pul. colapse w/chest tube; Tonsillectomy; carpel tunnel release; ORIF ulnar fracture (11/03/2012); Eye surgery; Inguinal hernia repair (12/18/2015); Inguinal hernia repair (Right, 12/18/2015); and Insertion of mesh (Right, 12/18/2015).   His family history includes Alcohol abuse in his father; Cancer in his brother and mother; Diabetes in his brother, sister, and sister; Lung disease in his father.He reports that he quit smoking about 19 years ago. His smoking use included cigarettes. he has never used smokeless tobacco. He reports that he drinks about 3.0 oz of alcohol per week. He reports that he does not use drugs.  Current Outpatient Medications on File Prior to Visit  Medication Sig Dispense Refill  . albuterol (PROVENTIL HFA;VENTOLIN HFA) 108 (90 Base) MCG/ACT inhaler Inhale 1-2 puffs into the lungs every 6 (six) hours as needed for wheezing or shortness of breath. 4 Inhaler 3  . ALPRAZolam (XANAX) 0.5 MG tablet Take 1 tablet (0.5 mg total) by mouth at bedtime. 30 tablet 5  . aspirin EC 81 MG tablet Take 1 tablet (81 mg total) by mouth daily. 90 tablet 3  . levothyroxine (  SYNTHROID, LEVOTHROID) 75 MCG tablet Take 1 tablet (75 mcg total) by  mouth daily before breakfast. 90 tablet 3  . nitroGLYCERIN (NITROSTAT) 0.4 MG SL tablet Place 1 tablet (0.4 mg total) under the tongue every 5 (five) minutes as needed for chest pain. 25 tablet 3  . olmesartan (BENICAR) 20 MG tablet Take 1 tablet (20 mg total) by mouth daily. 90 tablet 3  . pantoprazole (PROTONIX) 40 MG tablet Take 1 tablet (40 mg total) by mouth daily. 90 tablet 3  . polyethylene glycol powder (GLYCOLAX/MIRALAX) powder Take 17 grams (1 capful) dissolved in at least 8 ounces water/juice once daily 527 g 3  . pravastatin (PRAVACHOL) 40 MG tablet Take 1 tablet (40 mg total) by mouth every evening. 90 tablet 3  . silodosin (RAPAFLO) 4 MG CAPS capsule Take 4 mg by mouth daily with breakfast.     No current facility-administered medications on file prior to visit.     ROS Review of Systems  Constitutional: Negative for activity change, appetite change, chills, diaphoresis, fever and unexpected weight change.  HENT: Positive for congestion, postnasal drip, rhinorrhea and sinus pressure. Negative for ear discharge, ear pain, hearing loss, nosebleeds, sneezing, sore throat and trouble swallowing.   Eyes: Negative for visual disturbance.  Respiratory: Negative for cough, chest tightness and shortness of breath.   Cardiovascular: Negative for chest pain and palpitations.  Gastrointestinal: Negative for abdominal pain, constipation and diarrhea.  Genitourinary: Negative for dysuria and flank pain.  Musculoskeletal: Negative for arthralgias and joint swelling.  Skin: Negative for rash.  Neurological: Negative for dizziness and headaches.  Psychiatric/Behavioral: Negative for dysphoric mood and sleep disturbance.    Objective:  BP (!) 156/65   Pulse 88   Temp (!) 97.3 F (36.3 C) (Oral)   Ht 5\' 7"  (1.702 m)   Wt 136 lb (61.7 kg)   BMI 21.30 kg/m   BP Readings from Last 3 Encounters:  10/18/17 (!) 156/65  10/15/17 140/60  09/22/17 135/76    Wt Readings from Last 3  Encounters:  10/18/17 136 lb (61.7 kg)  10/15/17 136 lb 12.8 oz (62.1 kg)  09/22/17 136 lb (61.7 kg)     Physical Exam  Constitutional: He is oriented to person, place, and time. He appears well-developed and well-nourished. No distress.  HENT:  Head: Normocephalic and atraumatic.  Right Ear: Tympanic membrane and external ear normal. No decreased hearing is noted.  Left Ear: Tympanic membrane and external ear normal. No decreased hearing is noted.  Nose: Mucosal edema present. Right sinus exhibits no frontal sinus tenderness. Left sinus exhibits no frontal sinus tenderness.  Mouth/Throat: Oropharynx is clear and moist. No oropharyngeal exudate or posterior oropharyngeal erythema.  Eyes: Conjunctivae and EOM are normal. Pupils are equal, round, and reactive to light.  Neck: Normal range of motion. Neck supple. No Brudzinski's sign noted. No thyromegaly present.  Cardiovascular: Normal rate, regular rhythm and normal heart sounds.  No murmur heard. Pulmonary/Chest: Effort normal and breath sounds normal. No respiratory distress. He has no wheezes. He has no rales.  Abdominal: Soft. Bowel sounds are normal. He exhibits no distension. There is no tenderness.  Lymphadenopathy:       Head (right side): No preauricular adenopathy present.       Head (left side): No preauricular adenopathy present.    He has no cervical adenopathy.       Right cervical: No superficial cervical adenopathy present.      Left cervical: No superficial cervical adenopathy present.  Neurological: He is alert and oriented to person, place, and time. He has normal reflexes.  Skin: Skin is warm and dry.  Psychiatric: He has a normal mood and affect. His behavior is normal. Judgment and thought content normal.      Assessment & Plan:   Martavius was seen today for hypothyroidism.  Diagnoses and all orders for this visit:  Essential hypertension  Acute bronchitis due to Rhinovirus  Psychophysiological  insomnia  Acute bronchitis, unspecified organism  Gastroesophageal reflux disease with esophagitis  Other orders -     traZODone (DESYREL) 50 MG tablet; Take 1 tablet (50 mg total) at bedtime as needed by mouth for sleep.   Allergies as of 10/18/2017      Reactions   Amoxicillin Swelling, Other (See Comments)   Pt has swelling with PCN's   Penicillins Swelling   Lips swelling Has patient had a PCN reaction causing immediate rash, facial/tongue/throat swelling, SOB or lightheadedness with hypotension: No Has patient had a PCN reaction causing severe rash involving mucus membranes or skin necrosis: No Has patient had a PCN reaction that required hospitalization No Has patient had a PCN reaction occurring within the last 10 years: No If all of the above answers are "NO", then may proceed with Cephalosporin use.   Ceclor [cefaclor] Other (See Comments)   Pt does not remember reaction   Celebrex [celecoxib] Other (See Comments)   Pt does not remember reaction   Levaquin [levofloxacin In D5w] Other (See Comments)   Pt does not remember reaction      Medication List        Accurate as of 10/18/17  8:22 PM. Always use your most recent med list.          albuterol 108 (90 Base) MCG/ACT inhaler Commonly known as:  PROVENTIL HFA;VENTOLIN HFA Inhale 1-2 puffs into the lungs every 6 (six) hours as needed for wheezing or shortness of breath.   ALPRAZolam 0.5 MG tablet Commonly known as:  XANAX Take 1 tablet (0.5 mg total) by mouth at bedtime.   aspirin EC 81 MG tablet Take 1 tablet (81 mg total) by mouth daily.   levothyroxine 75 MCG tablet Commonly known as:  SYNTHROID, LEVOTHROID Take 1 tablet (75 mcg total) by mouth daily before breakfast.   nitroGLYCERIN 0.4 MG SL tablet Commonly known as:  NITROSTAT Place 1 tablet (0.4 mg total) under the tongue every 5 (five) minutes as needed for chest pain.   olmesartan 20 MG tablet Commonly known as:  BENICAR Take 1 tablet (20 mg  total) by mouth daily.   pantoprazole 40 MG tablet Commonly known as:  PROTONIX Take 1 tablet (40 mg total) by mouth daily.   polyethylene glycol powder powder Commonly known as:  GLYCOLAX/MIRALAX Take 17 grams (1 capful) dissolved in at least 8 ounces water/juice once daily   pravastatin 40 MG tablet Commonly known as:  PRAVACHOL Take 1 tablet (40 mg total) by mouth every evening.   silodosin 4 MG Caps capsule Commonly known as:  RAPAFLO Take 4 mg by mouth daily with breakfast.   traZODone 50 MG tablet Commonly known as:  DESYREL Take 1 tablet (50 mg total) at bedtime as needed by mouth for sleep.       Meds ordered this encounter  Medications  . traZODone (DESYREL) 50 MG tablet    Sig: Take 1 tablet (50 mg total) at bedtime as needed by mouth for sleep.    Dispense:  30 tablet  Refill:  2      Follow-up: Return in about 3 months (around 01/18/2018).  [name], M.D.

## 2017-10-18 NOTE — Telephone Encounter (Signed)
Great to hear, we will see him in follow-up

## 2017-11-10 DIAGNOSIS — Z961 Presence of intraocular lens: Secondary | ICD-10-CM | POA: Diagnosis not present

## 2017-11-10 DIAGNOSIS — H4323 Crystalline deposits in vitreous body, bilateral: Secondary | ICD-10-CM | POA: Diagnosis not present

## 2017-11-10 DIAGNOSIS — H353132 Nonexudative age-related macular degeneration, bilateral, intermediate dry stage: Secondary | ICD-10-CM | POA: Diagnosis not present

## 2017-11-10 DIAGNOSIS — H04123 Dry eye syndrome of bilateral lacrimal glands: Secondary | ICD-10-CM | POA: Diagnosis not present

## 2017-11-16 ENCOUNTER — Ambulatory Visit: Payer: Medicare Other | Admitting: Internal Medicine

## 2017-11-16 ENCOUNTER — Encounter: Payer: Self-pay | Admitting: Internal Medicine

## 2017-11-16 VITALS — BP 146/68 | HR 78 | Ht 66.5 in | Wt 138.0 lb

## 2017-11-16 DIAGNOSIS — K5730 Diverticulosis of large intestine without perforation or abscess without bleeding: Secondary | ICD-10-CM

## 2017-11-16 DIAGNOSIS — K219 Gastro-esophageal reflux disease without esophagitis: Secondary | ICD-10-CM

## 2017-11-16 DIAGNOSIS — K5900 Constipation, unspecified: Secondary | ICD-10-CM | POA: Diagnosis not present

## 2017-11-16 DIAGNOSIS — R1030 Lower abdominal pain, unspecified: Secondary | ICD-10-CM | POA: Diagnosis not present

## 2017-11-16 DIAGNOSIS — R1310 Dysphagia, unspecified: Secondary | ICD-10-CM | POA: Diagnosis not present

## 2017-11-16 NOTE — Patient Instructions (Addendum)
Continue Miralax daily.  Continue Pantoprazole daily.  Please follow up with Dr Hilarie Fredrickson in 6 months.  If you are age 81 or older, your body mass index should be between 23-30. Your Body mass index is 21.94 kg/m. If this is out of the aforementioned range listed, please consider follow up with your Primary Care Provider.  If you are age 55 or younger, your body mass index should be between 19-25. Your Body mass index is 21.94 kg/m. If this is out of the aformentioned range listed, please consider follow up with your Primary Care Provider.

## 2017-11-16 NOTE — Progress Notes (Signed)
   Subjective:    Patient ID: Albert Allen, male    DOB: [birth year], 83 y.o.   MRN: [id number]  HPI Albert Allen is a 83 year old male with a history of GERD with large hiatal hernia, history of duodenal diverticulum, colonic diverticulosis, constipation, BPH, hypothyroidism and hyperlipidemia who is here for follow-up.  He is here with his son.  He was last seen on 09/13/2017.  At the time of his last office visit he was reporting "nausea" which in actuality was lower abdominal pain and cramping.  I started him on MiraLAX 17 g daily.  He reports tremendous improvement in the symptom.  With MiraLAX he is having more frequent bowel movements 1-2 times daily.  He has had very rare if any "nausea" and lower abdominal pain.  He is still drinking prune juice from time to time.  Appetite has been great.  Occasionally he has trouble swallowing pills but does not have solid food or liquid dysphagia.  No odynophagia.  Denies heartburn.  He is using pantoprazole 40 mg daily which he feels works well.  He endorses some stress given his wife's dementia and occasionally he will drink alcohol to alleviate stress.  He will have 1 or 2 drinks rarely.  After seeing me he was seen by urology and they did not feel that his lower abdominal pain and "nausea" was bladder or urology related.  They did a bladder scan after voiding which was reportedly normal.  Review of Systems As per HPI, otherwise negative  Current Medications, Allergies, Past Medical History, Past Surgical History, Family History and Social History were reviewed in [employer] record.     Objective:   Physical Exam BP (!) 146/68   Pulse 78   Ht 5' 6.5" (1.689 m)   Wt 138 lb (62.6 kg)   BMI 21.94 kg/m  Constitutional: Well-developed and well-nourished. No distress. HEENT: Normocephalic and atraumatic.  No scleral icterus. Abdominal: Soft, nontender, nondistended. Bowel sounds active throughout. Extremities: no  clubbing, cyanosis, or edema Neurological: Alert and oriented to person place and time. Skin: Skin is warm and dry. Psychiatric: Normal mood and affect. Behavior is normal.     Assessment & Plan:  83 year old male with a history of GERD with large hiatal hernia, history of duodenal diverticulum, colonic diverticulosis, constipation, BPH, hypothyroidism and hyperlipidemia who is here for follow-up.   1.  Constipation/lower abdominal pain/nausea --much improved with MiraLAX.  No alarm symptoms or new complaint.  I also expect he is having symptomatic diverticular disease improved with MiraLAX laxative. --Continue MiraLAX 17 g daily  2.  GERD/hiatal hernia --controlled with pantoprazole.  Continue 40 mg daily  3.  Pill dysphagia --pills only with no weight loss.  No odynophagia.  Expect an element of dysmotility.  He will speak to his pharmacist to see if he can open some of his capsules for easier administration.  Will notify me if this worsens  76-month follow-up, sooner as needed 25 minutes spent with the patient today. Greater than 50% was spent in counseling and coordination of care with the patient

## 2017-12-09 ENCOUNTER — Ambulatory Visit (INDEPENDENT_AMBULATORY_CARE_PROVIDER_SITE_OTHER): Payer: Medicare Other | Admitting: Family

## 2017-12-09 ENCOUNTER — Ambulatory Visit (INDEPENDENT_AMBULATORY_CARE_PROVIDER_SITE_OTHER): Payer: Medicare Other

## 2017-12-09 ENCOUNTER — Encounter: Payer: Self-pay | Admitting: Family

## 2017-12-09 VITALS — BP 138/61 | HR 75 | Temp 96.7°F | Ht 66.5 in | Wt 137.8 lb

## 2017-12-09 DIAGNOSIS — R0781 Pleurodynia: Secondary | ICD-10-CM | POA: Diagnosis not present

## 2017-12-09 DIAGNOSIS — S20212A Contusion of left front wall of thorax, initial encounter: Secondary | ICD-10-CM

## 2017-12-09 DIAGNOSIS — W19XXXA Unspecified fall, initial encounter: Secondary | ICD-10-CM

## 2017-12-09 DIAGNOSIS — S299XXA Unspecified injury of thorax, initial encounter: Secondary | ICD-10-CM | POA: Diagnosis not present

## 2017-12-09 NOTE — Patient Instructions (Signed)
Rib Contusion A rib contusion is a deep bruise on your rib area. Contusions are the result of a blunt trauma that causes bleeding and injury to the tissues under the skin. A rib contusion may involve bruising of the ribs and of the skin and muscles in the area. The skin overlying the contusion may turn blue, purple, or yellow. Minor injuries will give you a painless contusion, but more severe contusions may stay painful and swollen for a few weeks. What are the causes? A contusion is usually caused by a blow, trauma, or direct force to an area of the body. This often occurs while playing contact sports. What are the signs or symptoms?  Swelling and redness of the injured area.  Discoloration of the injured area.  Tenderness and soreness of the injured area.  Pain with or without movement. How is this diagnosed? The diagnosis can be made by taking a medical history and performing a physical exam. An X-ray, CT scan, or MRI may be needed to determine if there were any associated injuries, such as broken bones (fractures) or internal injuries. How is this treated? Often, the best treatment for a rib contusion is rest. Icing or applying cold compresses to the injured area may help reduce swelling and inflammation. Deep breathing exercises may be recommended to reduce the risk of partial lung collapse and pneumonia. Over-the-counter or prescription medicines may also be recommended for pain control. Follow these instructions at home:  Apply ice to the injured area: ? Put ice in a plastic bag. ? Place a towel between your skin and the bag. ? Leave the ice on for 20 minutes, 2-3 times per day.  Take medicines only as directed by your health care provider.  Rest the injured area. Avoid strenuous activity and any activities or movements that cause pain. Be careful during activities and avoid bumping the injured area.  Perform deep-breathing exercises as directed by your health care provider.  Do  not lift anything that is heavier than 5 lb (2.3 kg) until your health care provider approves.  Do not use any tobacco products, including cigarettes, chewing tobacco, or electronic cigarettes. If you need help quitting, ask your health care provider. Contact a health care provider if:  You have increased bruising or swelling.  You have pain that is not controlled with treatment.  You have a fever. Get help right away if:  You have difficulty breathing or shortness of breath.  You develop a continual cough, or you cough up thick or bloody sputum.  You feel sick to your stomach (nauseous), you throw up (vomit), or you have abdominal pain. This information is not intended to replace advice given to you by your health care provider. Make sure you discuss any questions you have with your health care provider. Document Released: 08/11/2001 Document Revised: 04/23/2016 Document Reviewed: 08/28/2014 Elsevier Interactive Patient Education  2018 [employer]  

## 2017-12-09 NOTE — Progress Notes (Signed)
   Subjective:    Patient ID: Albert Allen, male    DOB: Sep 01, 1923, 84 y.o.   MRN: [id number]  Fall  The accident occurred 2 days ago. The fall occurred while standing. He fell from a height of 3 to 5 ft. He landed on dirt. There was no blood loss. The point of impact was the face. The pain is present in the back. The pain is at a severity of 4/10. The pain is moderate. The symptoms are aggravated by pressure on injury and movement. Pertinent negatives include no bowel incontinence, loss of consciousness, tingling or visual change. He has tried rest, ice and NSAID for the symptoms. The treatment provided mild relief.      Review of Systems  Gastrointestinal: Negative for bowel incontinence.  Neurological: Negative for tingling and loss of consciousness.  All other systems reviewed and are negative.      Objective:   Physical Exam  Constitutional: He is oriented to person, place, and time. He appears well-developed and well-nourished. No distress.  HENT:  Head: Normocephalic.  Eyes: Pupils are equal, round, and reactive to light. Right eye exhibits no discharge. Left eye exhibits no discharge.  Neck: Normal range of motion. Neck supple. No thyromegaly present.  Cardiovascular: Normal rate, regular rhythm, normal heart sounds and intact distal pulses.  No murmur heard. Pulmonary/Chest: Effort normal and breath sounds normal. No respiratory distress. He has no wheezes.  Abdominal: Soft. Bowel sounds are normal. He exhibits no distension. There is no tenderness.  Musculoskeletal: Normal range of motion. He exhibits tenderness (left lower rib tenderness, mild eccyhmosis ). He exhibits no edema.  Neurological: He is alert and oriented to person, place, and time.  Skin: Skin is warm and dry. No rash noted. No erythema.  Psychiatric: He has a normal mood and affect. His behavior is normal. Judgment and thought content normal.  Vitals reviewed.   x-ray- No fracture noted, Preliminary  reading by Evelina Dun, FNP [facility]   BP 138/61   Pulse 75   Temp (!) 96.7 F (35.9 C) (Oral)   Ht 5' 6.5" (1.689 m)   Wt 137 lb 12.8 oz (62.5 kg)   BMI 21.91 kg/m      Assessment & Plan:  1. Fall, initial encounter - DG Ribs Unilateral W/Chest Left; Future  2. Contusion of rib on left side, initial encounter Rest Ice Deep breathing and coughing encouraged Motrin or tylenol as needed for pain Fall preventions discussed RTO prn or if symptoms worsen or do not improve    Evelina Dun, FNP

## 2017-12-15 ENCOUNTER — Encounter: Payer: Self-pay | Admitting: *Deleted

## 2018-01-17 ENCOUNTER — Encounter: Payer: Self-pay | Admitting: Family Medicine

## 2018-01-17 ENCOUNTER — Ambulatory Visit (INDEPENDENT_AMBULATORY_CARE_PROVIDER_SITE_OTHER): Payer: Medicare Other | Admitting: Family Medicine

## 2018-01-17 ENCOUNTER — Other Ambulatory Visit: Payer: Self-pay | Admitting: Family Medicine

## 2018-01-17 VITALS — BP 143/57 | HR 83 | Ht 66.5 in | Wt 138.0 lb

## 2018-01-17 DIAGNOSIS — N401 Enlarged prostate with lower urinary tract symptoms: Secondary | ICD-10-CM | POA: Diagnosis not present

## 2018-01-17 DIAGNOSIS — J206 Acute bronchitis due to rhinovirus: Secondary | ICD-10-CM

## 2018-01-17 DIAGNOSIS — I10 Essential (primary) hypertension: Secondary | ICD-10-CM | POA: Diagnosis not present

## 2018-01-17 DIAGNOSIS — J45909 Unspecified asthma, uncomplicated: Secondary | ICD-10-CM | POA: Diagnosis not present

## 2018-01-17 DIAGNOSIS — E039 Hypothyroidism, unspecified: Secondary | ICD-10-CM | POA: Diagnosis not present

## 2018-01-17 DIAGNOSIS — R3911 Hesitancy of micturition: Secondary | ICD-10-CM | POA: Diagnosis not present

## 2018-01-17 DIAGNOSIS — K2100 Gastro-esophageal reflux disease with esophagitis, without bleeding: Secondary | ICD-10-CM

## 2018-01-17 DIAGNOSIS — D518 Other vitamin B12 deficiency anemias: Secondary | ICD-10-CM | POA: Diagnosis not present

## 2018-01-17 DIAGNOSIS — N138 Other obstructive and reflux uropathy: Secondary | ICD-10-CM | POA: Diagnosis not present

## 2018-01-17 DIAGNOSIS — E782 Mixed hyperlipidemia: Secondary | ICD-10-CM | POA: Diagnosis not present

## 2018-01-17 MED ORDER — LEVOTHYROXINE SODIUM 75 MCG PO TABS: 75.0000 ug | ORAL_TABLET | Freq: Every day | ORAL | 3 refills | Status: DC

## 2018-01-17 MED ORDER — OLMESARTAN MEDOXOMIL 40 MG PO TABS: 40.0000 mg | ORAL_TABLET | Freq: Every day | ORAL | 3 refills | Status: DC

## 2018-01-17 MED ORDER — PANTOPRAZOLE SODIUM 40 MG PO TBEC: 40.0000 mg | DELAYED_RELEASE_TABLET | Freq: Every day | ORAL | 3 refills | Status: DC

## 2018-01-17 MED ORDER — LINACLOTIDE 145 MCG PO CAPS: 145.0000 ug | ORAL_CAPSULE | Freq: Every day | ORAL | 2 refills | Status: DC

## 2018-01-17 NOTE — Patient Instructions (Signed)
Discontinue Miralax and Prune Juice Back Exercises If you have pain in your back, do these exercises 2-3 times each day or as told by your doctor. When the pain goes away, do the exercises once each day, but repeat the steps more times for each exercise (do more repetitions). If you do not have pain in your back, do these exercises once each day or as told by your doctor. Exercises Single Knee to Chest  Do these steps 3-5 times in a row for each leg: 1. Lie on your back on a firm bed or the floor with your legs stretched out. 2. Bring one knee to your chest. 3. Hold your knee to your chest by grabbing your knee or thigh. 4. Pull on your knee until you feel a gentle stretch in your lower back. 5. Keep doing the stretch for 10-30 seconds. 6. Slowly let go of your leg and straighten it.  Pelvic Tilt  Do these steps 5-10 times in a row: 1. Lie on your back on a firm bed or the floor with your legs stretched out. 2. Bend your knees so they point up to the ceiling. Your feet should be flat on the floor. 3. Tighten your lower belly (abdomen) muscles to press your lower back against the floor. This will make your tailbone point up to the ceiling instead of pointing down to your feet or the floor. 4. Stay in this position for 5-10 seconds while you gently tighten your muscles and breathe evenly.  Cat-Cow  Do these steps until your lower back bends more easily: 1. Get on your hands and knees on a firm surface. Keep your hands under your shoulders, and keep your knees under your hips. You may put padding under your knees. 2. Let your head hang down, and make your tailbone point down to the floor so your lower back is round like the back of a cat. 3. Stay in this position for 5 seconds. 4. Slowly lift your head and make your tailbone point up to the ceiling so your back hangs low (sags) like the back of a cow. 5. Stay in this position for 5 seconds.  Press-Ups  Do these steps 5-10 times in a  row: 1. Lie on your belly (face-down) on the floor. 2. Place your hands near your head, about shoulder-width apart. 3. While you keep your back relaxed and keep your hips on the floor, slowly straighten your arms to raise the top half of your body and lift your shoulders. Do not use your back muscles. To make yourself more comfortable, you may change where you place your hands. 4. Stay in this position for 5 seconds. 5. Slowly return to lying flat on the floor.  Bridges  Do these steps 10 times in a row: 1. Lie on your back on a firm surface. 2. Bend your knees so they point up to the ceiling. Your feet should be flat on the floor. 3. Tighten your butt muscles and lift your butt off of the floor until your waist is almost as high as your knees. If you do not feel the muscles working in your butt and the back of your thighs, slide your feet 1-2 inches farther away from your butt. 4. Stay in this position for 3-5 seconds. 5. Slowly lower your butt to the floor, and let your butt muscles relax.  If this exercise is too easy, try doing it with your arms crossed over your chest. Belly Crunches  Do  these steps 5-10 times in a row: 1. Lie on your back on a firm bed or the floor with your legs stretched out. 2. Bend your knees so they point up to the ceiling. Your feet should be flat on the floor. 3. Cross your arms over your chest. 4. Tip your chin a little bit toward your chest but do not bend your neck. 5. Tighten your belly muscles and slowly raise your chest just enough to lift your shoulder blades a tiny bit off of the floor. 6. Slowly lower your chest and your head to the floor.  Back Lifts Do these steps 5-10 times in a row: 1. Lie on your belly (face-down) with your arms at your sides, and rest your forehead on the floor. 2. Tighten the muscles in your legs and your butt. 3. Slowly lift your chest off of the floor while you keep your hips on the floor. Keep the back of your head in  line with the curve in your back. Look at the floor while you do this. 4. Stay in this position for 3-5 seconds. 5. Slowly lower your chest and your face to the floor.  Contact a doctor if:  Your back pain gets a lot worse when you do an exercise.  Your back pain does not lessen 2 hours after you exercise. If you have any of these problems, stop doing the exercises. Do not do them again unless your doctor says it is okay. Get help right away if:  You have sudden, very bad back pain. If this happens, stop doing the exercises. Do not do them again unless your doctor says it is okay. This information is not intended to replace advice given to you by your health care provider. Make sure you discuss any questions you have with your health care provider. Document Released: 12/19/2010 Document Revised: 04/23/2016 Document Reviewed: 01/10/2015 Elsevier Interactive Patient Education  [employer].

## 2018-01-17 NOTE — Progress Notes (Signed)
Subjective:  Patient ID: Albert Allen, male    DOB: 01/30/1923  Age: 84 y.o. MRN: [id number]  CC: Hypertension (pt here today for routine follow up of his chronic medical conditions )   HPI Albert Allen presents for  follow-up of hypertension. Patient has no history of headache chest pain or shortness of breath or recent cough. Patient also denies symptoms of TIA such as focal numbness or weakness. Patient checks  blood pressure at home. Readings recently rather high. Patient denies side effects from medication. States taking it regularly. Patient presents for follow-up on  thyroid. The patient has a history of hypothyroidism for many years. It has been stable recently. Pt. denies any change in  voice, loss of hair, heat or cold intolerance. Energy level has been adequate to good. Patient denies constipation and diarrhea. No myxedema. Medication is as noted below. Verified that pt is taking it daily on an empty stomach. Well tolerated.  Additionally patient is very concerned about the problems he is having with his bowels.  He wakes up in the morning with nausea.  He says it resolves but takes while he does not know what makes it go away it does not seem to be more related to food nor is it related to activity.  He is using MiraLAX twice daily and along with warm prune juice based on constipation issues.  This was recommended by his GI doctor.  On the other hand he says his stools are never formed they are generally loose and irregular and he has explosive gas at times.  He is also having some right lower quadrant pain with bowel movements that he describes being the area of his previous hernia surgery scar.  Patient also is concerned about his lower back.  He was contacted by phone about getting a new brace he wonders if he should do that.  He says it is more of a dull ache that is sharp pain.  Sometimes when he bends over he feels more of a dull ache but no sharp pains.  He would like to have  some relief from that.  A girlfriend gave him some diclofenac to take he wants to try that first if I am okay with it he will let me know if he needs to get a prescription.  History Shantel has a past medical history of ABNORMAL CV (STRESS) TEST (03/05/2009), ABUSE, ALCOHOL, IN REMISSION (05/31/2007), Adhesive capsulitis of shoulder (3/50/0938), Alcoholic polyneuropathy ([place]) (03/29/2008), ANEMIA, B12 DEFICIENCY (03/29/2008), Anxiety state, unspecified (11/19/2008), ASTHMA (05/31/2007), CAD, NATIVE VESSEL (04/04/2009), CARPAL TUNNEL SYNDROME, RIGHT (09/05/2007), Chronic prostatitis (03/29/2008), Crohn's disease ([place]), Degenerative disc disease, cervical, DEGENERATIVE DISC DISEASE, CERVICAL SPINE (06/22/2007), Diverticulosis, DYSPHAGIA UNSPECIFIED (09/25/2008), Esophageal stricture, External hemorrhoids, GERD (05/31/2007), GOUT (05/31/2007), Hepatomegaly (09/25/2008), Hiatal hernia, HIP PAIN (03/20/2010), History of echocardiogram, HYPERLIPIDEMIA (03/27/2009), HYPERTENSION (05/31/2007), HYPOTHYROIDISM (05/31/2007), LEFT BUNDLE BRANCH BLOCK (02/11/2009), Liver mass, left lobe, LOC OSTEOARTHROS NOT SPEC PRIM/SEC LOWER LEG (03/20/2010), OSTEOARTHRITIS (05/31/2007), PERSONAL HX COLONIC POLYPS (03/05/1997), PSA, INCREASED (03/20/2010), SINUSITIS, CHRONIC NOS (05/31/2007), Sore throat, SYNCOPE (11/19/2008), and WEIGHT LOSS, RECENT (11/19/2008).   He has a past surgical history that includes pul. colapse w/chest tube; Tonsillectomy; carpel tunnel release; ORIF ulnar fracture (11/03/2012); Eye surgery; Inguinal hernia repair (12/18/2015); Inguinal hernia repair (Right, 12/18/2015); and Insertion of mesh (Right, 12/18/2015).   His family history includes Alcohol abuse in his father; Cancer in his brother and mother; Diabetes in his brother, sister, and sister; Lung disease in his father.He reports that he  quit smoking about 19 years ago. His smoking use included cigarettes. he has never used smokeless tobacco. He reports that he drinks about 3.0 oz of  alcohol per week. He reports that he does not use drugs.  Current Outpatient Medications on File Prior to Visit  Medication Sig Dispense Refill  . albuterol (PROVENTIL HFA;VENTOLIN HFA) 108 (90 Base) MCG/ACT inhaler Inhale 1-2 puffs into the lungs every 6 (six) hours as needed for wheezing or shortness of breath. 4 Inhaler 3  . aspirin EC 81 MG tablet Take 1 tablet (81 mg total) by mouth daily. 90 tablet 3  . pravastatin (PRAVACHOL) 40 MG tablet Take 1 tablet (40 mg total) by mouth every evening. 90 tablet 3  . silodosin (RAPAFLO) 4 MG CAPS capsule Take 4 mg by mouth daily with breakfast.    . nitroGLYCERIN (NITROSTAT) 0.4 MG SL tablet Place 1 tablet (0.4 mg total) under the tongue every 5 (five) minutes as needed for chest pain. 25 tablet 3   No current facility-administered medications on file prior to visit.     ROS Review of Systems  Constitutional: Negative for chills, diaphoresis, fever and unexpected weight change.  HENT: Negative for congestion, hearing loss, rhinorrhea and sore throat.   Eyes: Negative for visual disturbance.  Respiratory: Negative for cough and shortness of breath.   Cardiovascular: Negative for chest pain.  Gastrointestinal: Positive for constipation and diarrhea. Negative for abdominal pain.  Genitourinary: Negative for dysuria and flank pain.  Musculoskeletal: Negative for arthralgias and joint swelling.  Skin: Negative for rash.  Neurological: Negative for dizziness and headaches.  Psychiatric/Behavioral: Negative for dysphoric mood and sleep disturbance.    Objective:  BP (!) 143/57   Pulse 83   Ht 5' 6.5" (1.689 m)   Wt 138 lb (62.6 kg)   BMI 21.94 kg/m   BP Readings from Last 3 Encounters:  01/17/18 (!) 143/57  12/09/17 138/61  11/16/17 (!) 146/68    Wt Readings from Last 3 Encounters:  01/17/18 138 lb (62.6 kg)  12/09/17 137 lb 12.8 oz (62.5 kg)  11/16/17 138 lb (62.6 kg)     Physical Exam  Constitutional: He is oriented to person,  place, and time. He appears well-developed and well-nourished. No distress.  HENT:  Head: Normocephalic and atraumatic.  Right Ear: External ear normal.  Left Ear: External ear normal.  Nose: Nose normal.  Mouth/Throat: Oropharynx is clear and moist.  Eyes: Conjunctivae and EOM are normal. Pupils are equal, round, and reactive to light.  Neck: Normal range of motion. Neck supple. No thyromegaly present.  Cardiovascular: Normal rate, regular rhythm and normal heart sounds.  No murmur heard. Pulmonary/Chest: Effort normal and breath sounds normal. No respiratory distress. He has no wheezes. He has no rales.  Abdominal: Soft. Bowel sounds are normal. He exhibits no distension. There is no tenderness.  Lymphadenopathy:    He has no cervical adenopathy.  Neurological: He is alert and oriented to person, place, and time. He has normal reflexes.  Skin: Skin is warm and dry.  Psychiatric: He has a normal mood and affect. His behavior is normal. Judgment and thought content normal.      Assessment & Plan:   Lonny was seen today for hypertension.  Diagnoses and all orders for this visit:  Hypothyroidism, unspecified type -     TSH -     T4, Free  ANEMIA, B12 DEFICIENCY  Uncomplicated asthma, unspecified asthma severity, unspecified whether persistent  Essential hypertension -  CMP14+EGFR  Acute bronchitis due to Rhinovirus -     levothyroxine (SYNTHROID, LEVOTHROID) 75 MCG tablet; Take 1 tablet (75 mcg total) by mouth daily before breakfast.  Gastroesophageal reflux disease with esophagitis -     pantoprazole (PROTONIX) 40 MG tablet; Take 1 tablet (40 mg total) by mouth daily.  Mixed hyperlipidemia -     Lipid panel  Other orders -     linaclotide (LINZESS) 145 MCG CAPS capsule; Take 1 capsule (145 mcg total) by mouth daily before breakfast. -     olmesartan (BENICAR) 40 MG tablet; Take 1 tablet (40 mg total) by mouth daily.   Allergies as of 01/17/2018      Reactions     Amoxicillin Swelling, Other (See Comments)   Pt has swelling with PCN's   Penicillins Swelling   Lips swelling Has patient had a PCN reaction causing immediate rash, facial/tongue/throat swelling, SOB or lightheadedness with hypotension: No Has patient had a PCN reaction causing severe rash involving mucus membranes or skin necrosis: No Has patient had a PCN reaction that required hospitalization No Has patient had a PCN reaction occurring within the last 10 years: No If all of the above answers are "NO", then may proceed with Cephalosporin use.   Ceclor [cefaclor] Other (See Comments)   Pt does not remember reaction   Celebrex [celecoxib] Other (See Comments)   Pt does not remember reaction   Levaquin [levofloxacin In D5w] Other (See Comments)   Pt does not remember reaction      Medication List        Accurate as of 01/17/18  5:39 PM. Always use your most recent med list.          albuterol 108 (90 Base) MCG/ACT inhaler Commonly known as:  PROVENTIL HFA;VENTOLIN HFA Inhale 1-2 puffs into the lungs every 6 (six) hours as needed for wheezing or shortness of breath.   ALPRAZolam 0.5 MG tablet Commonly known as:  XANAX TAKE ONE TABLET AT BEDTIME   aspirin EC 81 MG tablet Take 1 tablet (81 mg total) by mouth daily.   levothyroxine 75 MCG tablet Commonly known as:  SYNTHROID, LEVOTHROID Take 1 tablet (75 mcg total) by mouth daily before breakfast.   linaclotide 145 MCG Caps capsule Commonly known as:  LINZESS Take 1 capsule (145 mcg total) by mouth daily before breakfast.   nitroGLYCERIN 0.4 MG SL tablet Commonly known as:  NITROSTAT Place 1 tablet (0.4 mg total) under the tongue every 5 (five) minutes as needed for chest pain.   olmesartan 40 MG tablet Commonly known as:  BENICAR Take 1 tablet (40 mg total) by mouth daily.   pantoprazole 40 MG tablet Commonly known as:  PROTONIX Take 1 tablet (40 mg total) by mouth daily.   pravastatin 40 MG tablet Commonly  known as:  PRAVACHOL Take 1 tablet (40 mg total) by mouth every evening.   silodosin 4 MG Caps capsule Commonly known as:  RAPAFLO Take 4 mg by mouth daily with breakfast.       Meds ordered this encounter  Medications  . linaclotide (LINZESS) 145 MCG CAPS capsule    Sig: Take 1 capsule (145 mcg total) by mouth daily before breakfast.    Dispense:  30 capsule    Refill:  2  . olmesartan (BENICAR) 40 MG tablet    Sig: Take 1 tablet (40 mg total) by mouth daily.    Dispense:  90 tablet    Refill:  3  . levothyroxine (  SYNTHROID, LEVOTHROID) 75 MCG tablet    Sig: Take 1 tablet (75 mcg total) by mouth daily before breakfast.    Dispense:  90 tablet    Refill:  3  . pantoprazole (PROTONIX) 40 MG tablet    Sig: Take 1 tablet (40 mg total) by mouth daily.    Dispense:  90 tablet    Refill:  3    Patient should no longer take the MiraLAX and unless Linzess does not sufficiently clear his constipation.  Follow-up: Return in about 3 months (around 04/16/2018).  [name], M.D.

## 2018-01-17 NOTE — Telephone Encounter (Signed)
Seen today Dr Livia Snellen

## 2018-01-18 ENCOUNTER — Ambulatory Visit: Payer: Medicare Other | Admitting: Family Medicine

## 2018-01-18 LAB — CMP14+EGFR
ALK PHOS: 92 IU/L (ref 39–117)
ALT: 8 IU/L (ref 0–44)
AST: 12 IU/L (ref 0–40)
Albumin/Globulin Ratio: 2 (ref 1.2–2.2)
Albumin: 4.3 g/dL (ref 3.2–4.6)
BILIRUBIN TOTAL: 0.3 mg/dL (ref 0.0–1.2)
BUN/Creatinine Ratio: 11 (ref 10–24)
BUN: 11 mg/dL (ref 10–36)
CHLORIDE: 97 mmol/L (ref 96–106)
CO2: 26 mmol/L (ref 20–29)
CREATININE: 0.98 mg/dL (ref 0.76–1.27)
Calcium: 9.6 mg/dL (ref 8.6–10.2)
GFR calc Af Amer: 77 mL/min/{1.73_m2} (ref >59)
GFR calc non Af Amer: 67 mL/min/{1.73_m2} (ref >59)
GLOBULIN, TOTAL: 2.2 g/dL (ref 1.5–4.5)
GLUCOSE: 92 mg/dL (ref 65–99)
Potassium: 4.6 mmol/L (ref 3.5–5.2)
SODIUM: 137 mmol/L (ref 134–144)
Total Protein: 6.5 g/dL (ref 6.0–8.5)

## 2018-01-18 LAB — LIPID PANEL
CHOLESTEROL TOTAL: 176 mg/dL (ref 100–199)
Chol/HDL Ratio: 4.2 ratio (ref 0.0–5.0)
HDL: 42 mg/dL (ref >39)
LDL CALC: 97 mg/dL (ref 0–99)
TRIGLYCERIDES: 183 mg/dL — AB (ref 0–149)
VLDL Cholesterol Cal: 37 mg/dL (ref 5–40)

## 2018-01-18 LAB — TSH: TSH: 3.15 u[IU]/mL (ref 0.450–4.500)

## 2018-01-18 LAB — T4, FREE: FREE T4: 1.7 ng/dL (ref 0.82–1.77)

## 2018-01-19 ENCOUNTER — Other Ambulatory Visit: Payer: Self-pay | Admitting: Family Medicine

## 2018-02-28 IMAGING — DX DG CHEST 2V
2 series · 2 of 2 positions shown · non-contrast
Comparison: 06/19/2014

CLINICAL DATA: Shortness of breath and chest tightness, progressive
over the last few days.

EXAM:
CHEST  2 VIEW

[chest pa]
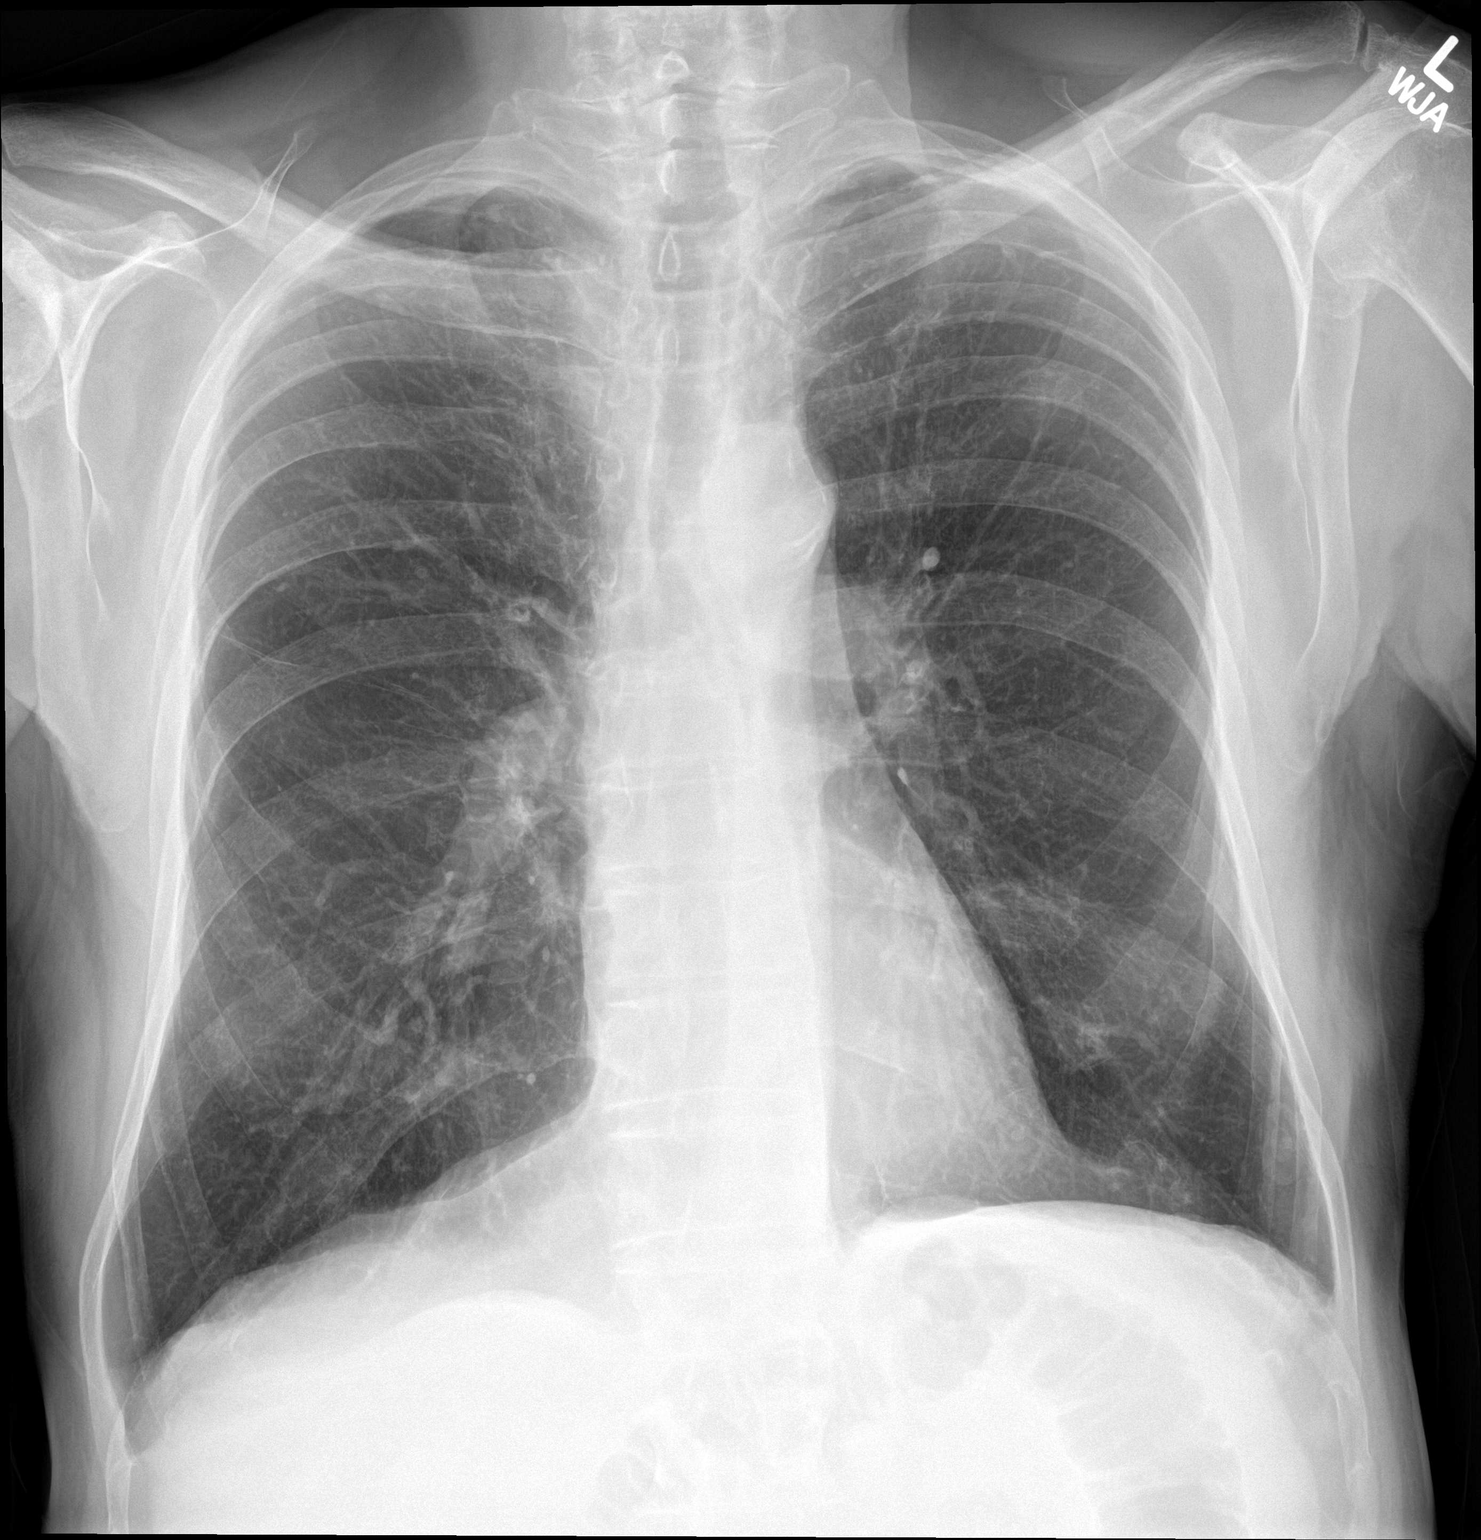

[chest lat]
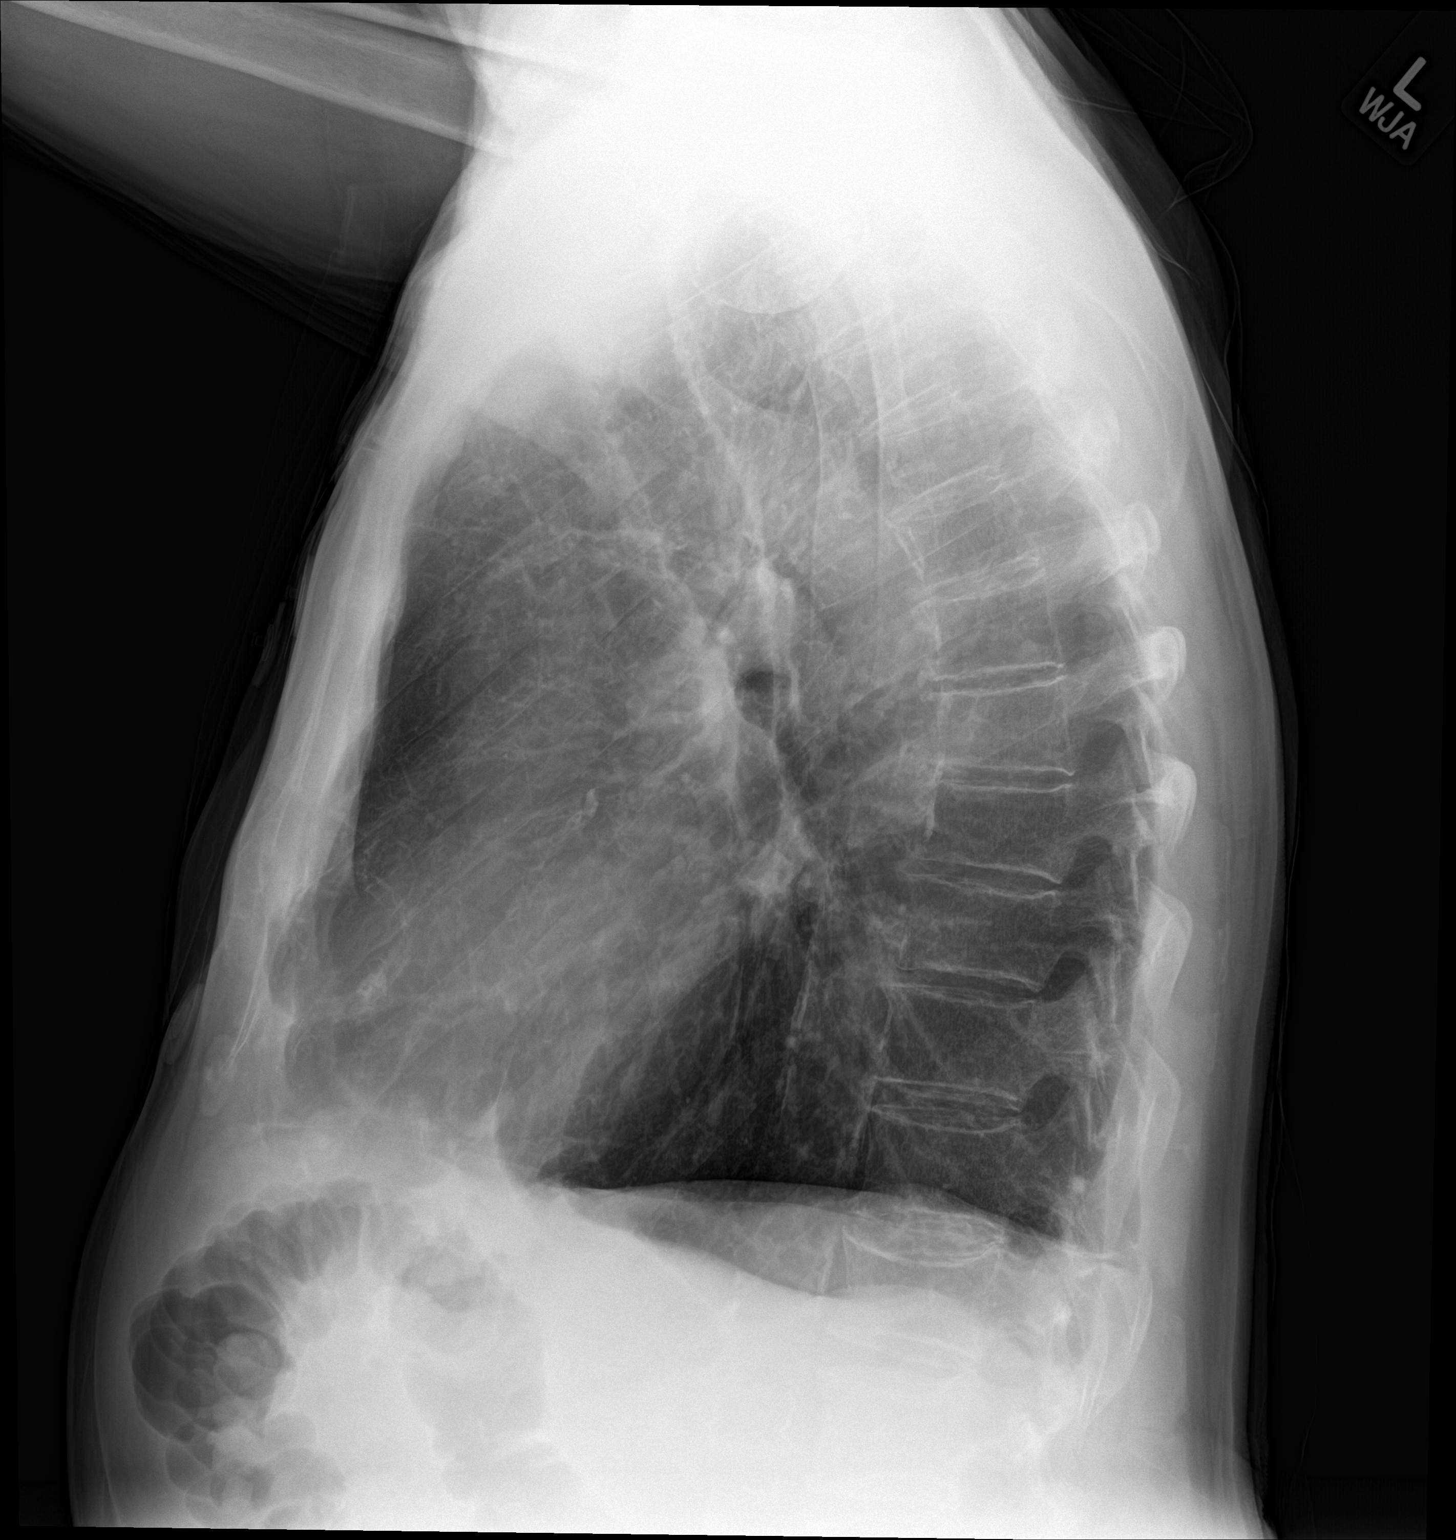

[2 of 2 positions shown; findings below may reference images not displayed]

FINDINGS: The lungs remain hyperinflated with central bronchitic markings. The
cardiomediastinal contours are normal. Atherosclerosis of thoracic
aorta. Pulmonary vasculature is normal. No consolidation, pleural
effusion, or pneumothorax. No acute osseous abnormalities are seen.
IMPRESSION: Chronic hyperinflation and central bronchitic markings. No
superimposed acute process.

## 2018-03-21 ENCOUNTER — Encounter: Payer: Self-pay | Admitting: Internal Medicine

## 2018-03-28 ENCOUNTER — Other Ambulatory Visit: Payer: Self-pay | Admitting: Family Medicine

## 2018-03-29 MED ORDER — ALBUTEROL SULFATE HFA 108 (90 BASE) MCG/ACT IN AERS: 1.0000 | INHALATION_SPRAY | Freq: Four times a day (QID) | RESPIRATORY_TRACT | 0 refills | Status: DC | PRN

## 2018-03-29 NOTE — Telephone Encounter (Signed)
Pt aware refill sent to [facility] 

## 2018-03-30 ENCOUNTER — Telehealth: Payer: Self-pay | Admitting: Family Medicine

## 2018-03-30 NOTE — Telephone Encounter (Signed)
Returned patient's phone call.   Patient states that he receive phone call that insurance will not cover albuterol.

## 2018-03-31 ENCOUNTER — Telehealth: Payer: Self-pay

## 2018-03-31 NOTE — Telephone Encounter (Signed)
Pt aware we received approval today ins will pay for albuterol till 11/29/18

## 2018-03-31 NOTE — Telephone Encounter (Signed)
Patient called and said that insurance needed prior auth for Albuterol HFA   Did not receive anything stating that from pharmacy but initiated prior auth on CMM's and it was approved

## 2018-04-01 ENCOUNTER — Ambulatory Visit (INDEPENDENT_AMBULATORY_CARE_PROVIDER_SITE_OTHER): Payer: Medicare Other | Admitting: Family

## 2018-04-01 ENCOUNTER — Encounter: Payer: Self-pay | Admitting: Family

## 2018-04-01 ENCOUNTER — Other Ambulatory Visit: Payer: Self-pay | Admitting: Pediatrics

## 2018-04-01 VITALS — BP 127/71 | HR 89 | Temp 97.2°F | Ht 66.5 in | Wt 134.8 lb

## 2018-04-01 DIAGNOSIS — J301 Allergic rhinitis due to pollen: Secondary | ICD-10-CM

## 2018-04-01 MED ORDER — FLUTICASONE PROPIONATE 50 MCG/ACT NA SUSP
2.0000 | Freq: Every day | NASAL | 6 refills | Status: DC
Start: 2018-04-01 — End: 2018-06-06

## 2018-04-01 MED ORDER — CETIRIZINE HCL 5 MG PO TABS: 5.0000 mg | ORAL_TABLET | Freq: Every day | ORAL | 2 refills | Status: DC

## 2018-04-01 MED ORDER — AZITHROMYCIN 250 MG PO TABS: ORAL_TABLET | ORAL | 0 refills | Status: DC

## 2018-04-01 NOTE — Patient Instructions (Signed)

## 2018-04-01 NOTE — Progress Notes (Signed)
   Subjective:    Patient ID: Albert Allen, male    DOB: July 16, 1923, 84 y.o.   MRN: [id number]  Chief Complaint  Patient presents with  . nasal drip  . sneezing    Sinus Problem  This is a new problem. The current episode started in the past 7 days (started two days ago after mowing yard). The problem is unchanged. There has been no fever. His pain is at a severity of 0/10. He is experiencing no pain. Associated symptoms include congestion and sneezing. Pertinent negatives include no chills, coughing, ear pain, headaches, hoarse voice, sinus pressure or sore throat. Past treatments include nothing. The treatment provided no relief.      Review of Systems  Constitutional: Negative for chills.  HENT: Positive for congestion and sneezing. Negative for ear pain, hoarse voice, sinus pressure and sore throat.   Respiratory: Negative for cough.   Neurological: Negative for headaches.  All other systems reviewed and are negative.      Objective:   Physical Exam  Constitutional: He is oriented to person, place, and time. He appears well-developed and well-nourished. No distress.  HENT:  Head: Normocephalic.  Right Ear: External ear normal.  Left Ear: External ear normal.  Nose: Mucosal edema and rhinorrhea present.  Mouth/Throat: Posterior oropharyngeal erythema present.  Eyes: Pupils are equal, round, and reactive to light. Right eye exhibits no discharge. Left eye exhibits no discharge.  Neck: Normal range of motion. Neck supple. No thyromegaly present.  Cardiovascular: Normal rate, regular rhythm, normal heart sounds and intact distal pulses.  No murmur heard. Pulmonary/Chest: Effort normal and breath sounds normal. No respiratory distress. He has no wheezes.  Abdominal: Soft. Bowel sounds are normal. He exhibits no distension. There is no tenderness.  Musculoskeletal: Normal range of motion. He exhibits no edema or tenderness.  Neurological: He is alert and oriented to person,  place, and time. He has normal reflexes. No cranial nerve deficit.  Skin: Skin is warm and dry. No rash noted. No erythema.  Psychiatric: He has a normal mood and affect. His behavior is normal. Judgment and thought content normal.  Vitals reviewed.     BP 127/71   Pulse 89   Temp (!) 97.2 F (36.2 C) (Oral)   Ht 5' 6.5" (1.689 m)   Wt 134 lb 12.8 oz (61.1 kg)   BMI 21.43 kg/m      Assessment & Plan:  Albert Allen was seen today for nasal drip and sneezing.  Diagnoses and all orders for this visit:  Allergic rhinitis due to pollen, unspecified seasonality -     fluticasone (FLONASE) 50 MCG/ACT nasal spray; Place 2 sprays into both nostrils daily. -     cetirizine (ZYRTEC) 5 MG tablet; Take 1 tablet (5 mg total) by mouth daily.  Other orders -     azithromycin (ZITHROMAX) 250 MG tablet; Take 500 mg once, then 250 mg for four days   I believe this is related to his allergies. Start zyrtec, flonase, and mucinex  Force fluids Avoid allergens   Lungs sound good today, but given age, chronic illness, and it Friday and our clinic will we have weekend hours will give  Zpak if symptoms get worse over the weekend.  RTO prn or if symptoms worsen or do not improve    Evelina Dun, FNP

## 2018-04-18 ENCOUNTER — Other Ambulatory Visit: Payer: Self-pay | Admitting: Cardiovascular Disease

## 2018-04-20 ENCOUNTER — Other Ambulatory Visit: Payer: Self-pay | Admitting: Family Medicine

## 2018-04-27 ENCOUNTER — Ambulatory Visit (INDEPENDENT_AMBULATORY_CARE_PROVIDER_SITE_OTHER): Payer: Medicare Other | Admitting: Family Medicine

## 2018-04-27 ENCOUNTER — Encounter: Payer: Self-pay | Admitting: Family Medicine

## 2018-04-27 VITALS — BP 132/61 | HR 96 | Temp 97.3°F | Ht 66.5 in | Wt 136.5 lb

## 2018-04-27 DIAGNOSIS — N401 Enlarged prostate with lower urinary tract symptoms: Secondary | ICD-10-CM | POA: Diagnosis not present

## 2018-04-27 DIAGNOSIS — E782 Mixed hyperlipidemia: Secondary | ICD-10-CM

## 2018-04-27 DIAGNOSIS — I10 Essential (primary) hypertension: Secondary | ICD-10-CM

## 2018-04-27 DIAGNOSIS — M1991 Primary osteoarthritis, unspecified site: Secondary | ICD-10-CM | POA: Diagnosis not present

## 2018-04-27 DIAGNOSIS — E039 Hypothyroidism, unspecified: Secondary | ICD-10-CM | POA: Diagnosis not present

## 2018-04-27 DIAGNOSIS — R350 Frequency of micturition: Secondary | ICD-10-CM

## 2018-05-01 ENCOUNTER — Encounter: Payer: Self-pay | Admitting: Family Medicine

## 2018-05-01 MED ORDER — ALPRAZOLAM 0.5 MG PO TABS: 0.5000 mg | ORAL_TABLET | Freq: Every day | ORAL | 2 refills | Status: DC

## 2018-05-01 NOTE — Progress Notes (Signed)
Subjective:  Patient ID: Albert Allen, male    DOB: 21-Dec-1922  Age: 84 y.o. MRN: [id number]  CC: Medical Management of Chronic Issues   HPI Albert Allen presents for  follow-up of hypertension. Patient has no history of headache chest pain or shortness of breath or recent cough. Patient also denies symptoms of TIA such as focal numbness or weakness. Patient denies side effects from medication. States taking it regularly. Patient is having trouble with his feet burning still.  It is manageable for the most part.  Occasionally gets to be uncomfortable but not enough to seek further treatment.  He is dealing with some balance issues.  He fell this morning but denies any injury.  He relates both of his back to his years of using alcohol and a neuropathy related to that.  He is not drinking and has not for many years now.  He is also very nervous from having to take care of his wife who has severe dementia.  He is her primary caregiver even though at age 46 he has limited ability to do so.  He has very little help with her.  Patient presents for follow-up on  thyroid. The patient has a history of hypothyroidism for many years. It has been stable recently. Pt. denies any change in  voice, loss of hair, heat or cold intolerance. Energy level has been adequate to good. Patient denies constipation and diarrhea. No myxedema. Medication is as noted below. Verified that pt is taking it daily on an empty stomach. Well tolerated. Patient in for follow-up of elevated cholesterol. Doing well without complaints on current medication. Denies side effects of statin including myalgia and arthralgia and nausea. Also in today for liver function testing. Currently no chest pain, shortness of breath or other cardiovascular related symptoms noted.  History Albert Allen has a past medical history of ABNORMAL CV (STRESS) TEST (03/05/2009), ABUSE, ALCOHOL, IN REMISSION (05/31/2007), Adhesive capsulitis of shoulder (2/59/5638),  Alcoholic polyneuropathy ([place]) (03/29/2008), ANEMIA, B12 DEFICIENCY (03/29/2008), Anxiety state, unspecified (11/19/2008), ASTHMA (05/31/2007), CAD, NATIVE VESSEL (04/04/2009), CARPAL TUNNEL SYNDROME, RIGHT (09/05/2007), Chronic prostatitis (03/29/2008), Crohn's disease ([place]), Degenerative disc disease, cervical, DEGENERATIVE DISC DISEASE, CERVICAL SPINE (06/22/2007), Diverticulosis, DYSPHAGIA UNSPECIFIED (09/25/2008), Esophageal stricture, External hemorrhoids, GERD (05/31/2007), GOUT (05/31/2007), Hepatomegaly (09/25/2008), Hiatal hernia, HIP PAIN (03/20/2010), History of echocardiogram, HYPERLIPIDEMIA (03/27/2009), HYPERTENSION (05/31/2007), HYPOTHYROIDISM (05/31/2007), LEFT BUNDLE BRANCH BLOCK (02/11/2009), Liver mass, left lobe, LOC OSTEOARTHROS NOT SPEC PRIM/SEC LOWER LEG (03/20/2010), OSTEOARTHRITIS (05/31/2007), PERSONAL HX COLONIC POLYPS (03/05/1997), PSA, INCREASED (03/20/2010), SINUSITIS, CHRONIC NOS (05/31/2007), Sore throat, SYNCOPE (11/19/2008), and WEIGHT LOSS, RECENT (11/19/2008).   He has a past surgical history that includes pul. colapse w/chest tube; Tonsillectomy; carpel tunnel release; ORIF ulnar fracture (11/03/2012); Eye surgery; Inguinal hernia repair (12/18/2015); Inguinal hernia repair (Right, 12/18/2015); and Insertion of mesh (Right, 12/18/2015).   His family history includes Alcohol abuse in his father; Cancer in his brother and mother; Diabetes in his brother, sister, and sister; Lung disease in his father.He reports that he quit smoking about 20 years ago. His smoking use included cigarettes. He has never used smokeless tobacco. He reports that he drinks about 3.0 oz of alcohol per week. He reports that he does not use drugs.  Current Outpatient Medications on File Prior to Visit  Medication Sig Dispense Refill  . aspirin EC 81 MG tablet Take 1 tablet (81 mg total) by mouth daily. 90 tablet 3  . cetirizine (ZYRTEC) 5 MG tablet Take 1 tablet (5 mg total) by mouth  daily. 30 tablet 2  . fluticasone (FLONASE)  50 MCG/ACT nasal spray Place 2 sprays into both nostrils daily. 16 g 6  . levothyroxine (SYNTHROID, LEVOTHROID) 75 MCG tablet Take 1 tablet (75 mcg total) by mouth daily before breakfast. 90 tablet 3  . nitroGLYCERIN (NITROSTAT) 0.4 MG SL tablet Place 1 tablet (0.4 mg total) under the tongue every 5 (five) minutes as needed for chest pain. 25 tablet 3  . olmesartan (BENICAR) 40 MG tablet Take 1 tablet (40 mg total) by mouth daily. 90 tablet 3  . pantoprazole (PROTONIX) 40 MG tablet Take 1 tablet (40 mg total) by mouth daily. 90 tablet 3  . pravastatin (PRAVACHOL) 40 MG tablet TAKE 1 TABLET EVERY EVENING 90 tablet 1  . PROAIR HFA 108 (90 Base) MCG/ACT inhaler INHALE 2 PUFFS EVERY SIX HOURS AS NEEDED FOR WHEEZING AND SHORT OF BREATH 25.5 g 0  . silodosin (RAPAFLO) 4 MG CAPS capsule Take 4 mg by mouth daily with breakfast.     No current facility-administered medications on file prior to visit.     ROS Review of Systems  Constitutional: Negative.   HENT: Negative.   Eyes: Negative for visual disturbance.  Respiratory: Negative for cough and shortness of breath.   Cardiovascular: Negative for chest pain and leg swelling.  Gastrointestinal: Negative for abdominal pain, diarrhea, nausea and vomiting.  Genitourinary: Negative for difficulty urinating.  Musculoskeletal: Positive for arthralgias (Multiple joints involving primarily hips and knees however.  Currently stable.  May have contributed to his fall however.). Negative for myalgias.  Skin: Negative for rash.  Neurological: Negative for headaches.  Psychiatric/Behavioral: Negative for sleep disturbance.    Objective:  BP 132/61   Pulse 96   Temp (!) 97.3 F (36.3 C) (Oral)   Ht 5' 6.5" (1.689 m)   Wt 136 lb 8 oz (61.9 kg)   BMI 21.70 kg/m   BP Readings from Last 3 Encounters:  04/27/18 132/61  04/01/18 127/71  01/17/18 (!) 143/57    Wt Readings from Last 3 Encounters:  04/27/18 136 lb 8 oz (61.9 kg)  04/01/18 134 lb 12.8 oz  (61.1 kg)  01/17/18 138 lb (62.6 kg)     Physical Exam  Constitutional: He is oriented to person, place, and time. He appears well-developed and well-nourished. No distress.  HENT:  Head: Normocephalic and atraumatic.  Right Ear: External ear normal.  Left Ear: External ear normal.  Nose: Nose normal.  Mouth/Throat: Oropharynx is clear and moist.  Eyes: Pupils are equal, round, and reactive to light. Conjunctivae and EOM are normal.  Neck: Normal range of motion. Neck supple. No thyromegaly present.  Cardiovascular: Normal rate, regular rhythm and normal heart sounds.  No murmur heard. Pulmonary/Chest: Effort normal and breath sounds normal. No respiratory distress. He has no wheezes. He has no rales.  Abdominal: Soft. Bowel sounds are normal. He exhibits no distension. There is no tenderness.  Lymphadenopathy:    He has no cervical adenopathy.  Neurological: He is alert and oriented to person, place, and time. He has normal reflexes.  Skin: Skin is warm and dry.  Psychiatric: He has a normal mood and affect. His behavior is normal. Judgment and thought content normal.      Assessment & Plan:   Cal was seen today for medical management of chronic issues.  Diagnoses and all orders for this visit:  Hypothyroidism, unspecified type  Benign prostatic hyperplasia with urinary frequency  Mixed hyperlipidemia  Essential hypertension  Primary osteoarthritis, unspecified site  Other orders -     ALPRAZolam (XANAX) 0.5 MG tablet; Take 1 tablet (0.5 mg total) by mouth at bedtime.   Allergies as of 04/27/2018      Reactions   Amoxicillin Swelling, Other (See Comments)   Pt has swelling with PCN's   Penicillins Swelling   Lips swelling Has patient had a PCN reaction causing immediate rash, facial/tongue/throat swelling, SOB or lightheadedness with hypotension: No Has patient had a PCN reaction causing severe rash involving mucus membranes or skin necrosis: No Has patient  had a PCN reaction that required hospitalization No Has patient had a PCN reaction occurring within the last 10 years: No If all of the above answers are "NO", then may proceed with Cephalosporin use.   Ceclor [cefaclor] Other (See Comments)   Pt does not remember reaction   Celebrex [celecoxib] Other (See Comments)   Pt does not remember reaction   Levaquin [levofloxacin In D5w] Other (See Comments)   Pt does not remember reaction      Medication List        Accurate as of 04/27/18 11:59 PM. Always use your most recent med list.          ALPRAZolam 0.5 MG tablet Commonly known as:  XANAX Take 1 tablet (0.5 mg total) by mouth at bedtime.   aspirin EC 81 MG tablet Take 1 tablet (81 mg total) by mouth daily.   cetirizine 5 MG tablet Commonly known as:  ZYRTEC Take 1 tablet (5 mg total) by mouth daily.   fluticasone 50 MCG/ACT nasal spray Commonly known as:  FLONASE Place 2 sprays into both nostrils daily.   levothyroxine 75 MCG tablet Commonly known as:  SYNTHROID, LEVOTHROID Take 1 tablet (75 mcg total) by mouth daily before breakfast.   nitroGLYCERIN 0.4 MG SL tablet Commonly known as:  NITROSTAT Place 1 tablet (0.4 mg total) under the tongue every 5 (five) minutes as needed for chest pain.   olmesartan 40 MG tablet Commonly known as:  BENICAR Take 1 tablet (40 mg total) by mouth daily.   pantoprazole 40 MG tablet Commonly known as:  PROTONIX Take 1 tablet (40 mg total) by mouth daily.   pravastatin 40 MG tablet Commonly known as:  PRAVACHOL TAKE 1 TABLET EVERY EVENING   PROAIR HFA 108 (90 Base) MCG/ACT inhaler Generic drug:  albuterol INHALE 2 PUFFS EVERY SIX HOURS AS NEEDED FOR WHEEZING AND SHORT OF BREATH   silodosin 4 MG Caps capsule Commonly known as:  RAPAFLO Take 4 mg by mouth daily with breakfast.       Meds ordered this encounter  Medications  . ALPRAZolam (XANAX) 0.5 MG tablet    Sig: Take 1 tablet (0.5 mg total) by mouth at bedtime.     Dispense:  30 tablet    Refill:  2    I cautioned the patient regarding fall precautions including removing objects that could cause him to stumble including low-lying objects that may get in his way including rugs carpets etc.  Bending etc. he was reminded to use his walker and or quad cane as well.  Although his anxiety is such that I do not think I can wean him off of his Xanax, I did caution him regarding the use of Xanax and the risk to his balance and mobility when he is using the medication.  He voices understanding.  Follow-up: Return in about 6 months (around 10/28/2018).  [name], M.D.

## 2018-06-06 ENCOUNTER — Ambulatory Visit: Payer: Medicare Other | Admitting: Internal Medicine

## 2018-06-06 ENCOUNTER — Other Ambulatory Visit (INDEPENDENT_AMBULATORY_CARE_PROVIDER_SITE_OTHER): Payer: Medicare Other

## 2018-06-06 ENCOUNTER — Encounter: Payer: Self-pay | Admitting: Internal Medicine

## 2018-06-06 VITALS — BP 124/64 | HR 72 | Ht 66.5 in | Wt 134.0 lb

## 2018-06-06 DIAGNOSIS — R1030 Lower abdominal pain, unspecified: Secondary | ICD-10-CM

## 2018-06-06 DIAGNOSIS — K5904 Chronic idiopathic constipation: Secondary | ICD-10-CM

## 2018-06-06 DIAGNOSIS — R1310 Dysphagia, unspecified: Secondary | ICD-10-CM

## 2018-06-06 DIAGNOSIS — K219 Gastro-esophageal reflux disease without esophagitis: Secondary | ICD-10-CM | POA: Diagnosis not present

## 2018-06-06 LAB — CBC WITH DIFFERENTIAL/PLATELET
Basophils Absolute: 0 10*3/uL (ref 0.0–0.1)
Basophils Relative: 0.4 % (ref 0.0–3.0)
EOS ABS: 0.1 10*3/uL (ref 0.0–0.7)
EOS PCT: 1.9 % (ref 0.0–5.0)
HCT: 36.8 % — ABNORMAL LOW (ref 39.0–52.0)
HEMOGLOBIN: 12.8 g/dL — AB (ref 13.0–17.0)
Lymphocytes Relative: 49.8 % — ABNORMAL HIGH (ref 12.0–46.0)
Lymphs Abs: 1.5 10*3/uL (ref 0.7–4.0)
MCHC: 34.7 g/dL (ref 30.0–36.0)
MCV: 99.4 fl (ref 78.0–100.0)
MONO ABS: 0.7 10*3/uL (ref 0.1–1.0)
Monocytes Relative: 23.5 % — ABNORMAL HIGH (ref 3.0–12.0)
Neutro Abs: 0.7 10*3/uL — ABNORMAL LOW (ref 1.4–7.7)
Neutrophils Relative %: 24.4 % — ABNORMAL LOW (ref 43.0–77.0)
Platelets: 164 10*3/uL (ref 150.0–400.0)
RBC: 3.7 Mil/uL — AB (ref 4.22–5.81)
RDW: 13.4 % (ref 11.5–15.5)
WBC: 3.1 10*3/uL — AB (ref 4.0–10.5)

## 2018-06-06 LAB — VITAMIN B12: VITAMIN B 12: 409 pg/mL (ref 211–911)

## 2018-06-06 LAB — FOLATE: Folate: 15.2 ng/mL (ref >5.9)

## 2018-06-06 MED ORDER — LUBIPROSTONE 8 MCG PO CAPS: 8.0000 ug | ORAL_CAPSULE | Freq: Two times a day (BID) | ORAL | 3 refills | Status: DC

## 2018-06-06 NOTE — Progress Notes (Signed)
Subjective:    Patient ID: Albert Allen, male    DOB: [birth year], 84 y.o.   MRN: [id number]  HPI Albert Allen is a 84 year old male with a history of GERD with large hiatal hernia, history of duodenal diverticulum, colonic diverticulosis, constipation with lower abdominal pain, BPH, hypothyroidism and hyperlipidemia who is here for follow-up.  He is here today with his son and was last seen on 11/16/2017.  He reports that his "nausea" across his lower abdomen has returned.  This had previously resolved entirely when we added MiraLAX and he was having more regular bowel movement.  He reports that MiraLAX is now not doing as well as it was before.  He is added warm prune juice on multiple days per week and with this he will often have a bowel movement which is larger and more complete.  With this his lower abdominal discomfort resolves.  He has not seen blood in his stool or melena.  He describes this "nauseated feeling" across his lower abdomen which is present every morning when he awakes.  This can be quite uncomfortable for him until he is able to have a bowel movement.  He is been using ice packs.  Pain is not associated with food.  He denies vomiting.  No upper abdominal pain.  Lately he has noted some intense coughing with liquids but does not feel that food sticks when he swallows.  He denies odynophagia.  He has occasional pill dysphagia.  He has continued with pantoprazole which he takes 40 mg daily and reports this controls his heartburn symptoms.  He feels his weight is stable but it is down about 4 pounds on our scales.  He is eating 3 meals a day.  He is just finishing a Z-Pak for URI type symptoms.  He has 1 tablet remaining.  No fevers.  Denies chest pain or shortness of breath.   Review of Systems As per HPI, otherwise negative  Current Medications, Allergies, Past Medical History, Past Surgical History, Family History and Social History were reviewed in [facility] record.     Objective:   Physical Exam BP 124/64   Pulse 72   Ht 5' 6.5" (1.689 m)   Wt 134 lb (60.8 kg)   BMI 21.30 kg/m  Constitutional: Well-developed and well-nourished. No distress. HEENT: Normocephalic and atraumatic. Conjunctivae are normal.  No scleral icterus. Neck: Neck supple. Trachea midline. Cardiovascular: Normal rate, regular rhythm and intact distal pulses.  Pulmonary/chest: Effort normal and breath sounds normal. No wheezing, rales or rhonchi. Abdominal: Soft, mild tenderness across the lower abdomen without rebound or guarding, nondistended. Bowel sounds active throughout. There are no masses palpable. No hepatosplenomegaly. Extremities: no clubbing, cyanosis, or edema Neurological: Alert and oriented to person place and time. Skin: Skin is warm and dry. Psychiatric: Normal mood and affect. Behavior is normal.     Assessment & Plan:  84 year old male with a history of GERD with large hiatal hernia, history of duodenal diverticulum, colonic diverticulosis, constipation with lower abdominal pain, BPH, hypothyroidism and hyperlipidemia who is here for follow-up.   1.  Lower abdominal pain/constipation --the "nauseated feeling" that he is describing his lower abdominal discomfort.  This improved completely with MiraLAX but now MiraLAX is not as effective for him.  I am going to try him on Amitiza 8 mcg twice daily.  He was given samples.  If not improving I would recommend we repeat CT scan of the abdomen pelvis with contrast.  Check  CBC today to ensure no progressive anemia.  I asked the patient and his son to notify me in about 10 days as to whether or not Amitiza is helping with constipation and improving his lower abdominal discomfort.  We may need to dose titrate for effect.  We will discontinue MiraLAX for now.  2.  Cough with swallowing --query aspiration.  Barium esophagram recommended.  3.  GERD with hiatal hernia --GERD symptoms seem well  controlled.  Continue pantoprazole 40 mg daily  25 minutes spent with the patient today. Greater than 50% was spent in counseling and coordination of care with the patient

## 2018-06-06 NOTE — Patient Instructions (Addendum)
Discontinue MIralax.  We have sent the following medications to your pharmacy for you to pick up at your convenience: Amitiza 8 mcg twice daily with food  Call our office if your lower abdominal pain/nausea does not improve.  Your provider has requested that you go to the basement level for lab work before leaving today. Press "B" on the elevator. The lab is located at the first door on the left as you exit the elevator.  You have been scheduled for a Barium Esophogram at [facility] (1st floor of the hospital) on Monday, 06/13/18 at 10:30 am. Please arrive 15 minutes prior to your appointment for registration. Make certain not to have anything to eat or drink 6 hours prior to your test. If you need to reschedule for any reason, please contact radiology at [phone] to do so. __________________________________________________________________ A barium swallow is an examination that concentrates on views of the esophagus. This tends to be a double contrast exam (barium and two liquids which, when combined, create a gas to distend the wall of the oesophagus) or single contrast (non-ionic iodine based). The study is usually tailored to your symptoms so a good history is essential. Attention is paid during the study to the form, structure and configuration of the esophagus, looking for functional disorders (such as aspiration, dysphagia, achalasia, motility and reflux) EXAMINATION You may be asked to change into a gown, depending on the type of swallow being performed. A radiologist and radiographer will perform the procedure. The radiologist will advise you of the type of contrast selected for your procedure and direct you during the exam. You will be asked to stand, sit or lie in several different positions and to hold a small amount of fluid in your mouth before being asked to swallow while the imaging is performed .In some instances you may be asked to swallow barium coated marshmallows to  assess the motility of a solid food bolus. The exam can be recorded as a digital or video fluoroscopy procedure. POST PROCEDURE It will take 1-2 days for the barium to pass through your system. To facilitate this, it is important, unless otherwise directed, to increase your fluids for the next 24-48hrs and to resume your normal diet.  This test typically takes about 30 minutes to perform. __________________________________________________________________________________   If you are age 82 or older, your body mass index should be between 23-30. Your Body mass index is 21.3 kg/m. If this is out of the aforementioned range listed, please consider follow up with your Primary Care Provider.  If you are age 82 or younger, your body mass index should be between 19-25. Your Body mass index is 21.3 kg/m. If this is out of the aformentioned range listed, please consider follow up with your Primary Care Provider.

## 2018-06-07 ENCOUNTER — Telehealth: Payer: Self-pay | Admitting: Internal Medicine

## 2018-06-07 ENCOUNTER — Other Ambulatory Visit: Payer: Self-pay

## 2018-06-07 DIAGNOSIS — D72819 Decreased white blood cell count, unspecified: Secondary | ICD-10-CM

## 2018-06-07 NOTE — Telephone Encounter (Signed)
DIscussed with pts son and reviewed lab results and recommendations from Dr. Hilarie Fredrickson. See result note.

## 2018-06-09 ENCOUNTER — Encounter: Payer: Self-pay | Admitting: Oncology

## 2018-06-09 ENCOUNTER — Telehealth: Payer: Self-pay | Admitting: Internal Medicine

## 2018-06-09 ENCOUNTER — Telehealth: Payer: Self-pay | Admitting: Oncology

## 2018-06-09 DIAGNOSIS — Z961 Presence of intraocular lens: Secondary | ICD-10-CM | POA: Diagnosis not present

## 2018-06-09 DIAGNOSIS — H353132 Nonexudative age-related macular degeneration, bilateral, intermediate dry stage: Secondary | ICD-10-CM | POA: Diagnosis not present

## 2018-06-09 DIAGNOSIS — H4323 Crystalline deposits in vitreous body, bilateral: Secondary | ICD-10-CM | POA: Diagnosis not present

## 2018-06-09 DIAGNOSIS — H04123 Dry eye syndrome of bilateral lacrimal glands: Secondary | ICD-10-CM | POA: Diagnosis not present

## 2018-06-09 NOTE — Telephone Encounter (Signed)
Discussed with pt that I have not called him since Monday. He states he got a call and he wasn't sure who it was and he wanted to know about his hematology appt. Discussed with pt that the referral has been made and they will contact him regarding the appt. Pt given the phone number for the cancer center.

## 2018-06-09 NOTE — Telephone Encounter (Signed)
New hematology referral received from Dr. Hilarie Fredrickson for leukopenia. Spoke to the pt's son to schedule an appt, due to pt's hearing impairment. Pt has been scheduled to see Dr. Alen Blew on 7/31 at [place] to arrive 30 minutes early. Letter mailed.

## 2018-06-13 ENCOUNTER — Ambulatory Visit (HOSPITAL_COMMUNITY)
Admission: RE | Admit: 2018-06-13 | Discharge: 2018-06-13 | Disposition: A | Discharge status: Home / Self Care | Payer: Medicare Other | Source: Ambulatory Visit | Attending: Internal Medicine | Admitting: Internal Medicine

## 2018-06-13 DIAGNOSIS — K225 Diverticulum of esophagus, acquired: Secondary | ICD-10-CM | POA: Diagnosis not present

## 2018-06-13 DIAGNOSIS — K449 Diaphragmatic hernia without obstruction or gangrene: Secondary | ICD-10-CM | POA: Insufficient documentation

## 2018-06-13 DIAGNOSIS — K219 Gastro-esophageal reflux disease without esophagitis: Secondary | ICD-10-CM | POA: Diagnosis not present

## 2018-06-13 DIAGNOSIS — R1310 Dysphagia, unspecified: Secondary | ICD-10-CM | POA: Insufficient documentation

## 2018-06-17 ENCOUNTER — Telehealth: Payer: Self-pay | Admitting: Internal Medicine

## 2018-06-17 NOTE — Telephone Encounter (Signed)
His "nausea" as he describes it is lower abd discomfort which gets better with effective BMs In order for Amitiza to work it needs to be taken twice daily with food. His urination should not be affected by the Amitiza.   I would say he go back to BID dosing and determine if this is helping him move his bowels daily and completely. For urinary symptoms he should call his PCP or urologist to discuss

## 2018-06-17 NOTE — Telephone Encounter (Signed)
Pt states he was taking the amitiza BID but he was not having to urinate at night like he did before. He was concerned and spoke with the pharmacist and was told to take the [place] daily. He has done this and has not really noticed a change. Also reports that his nausea is not better, if anything it is worse. He was calling to give Dr. Hilarie Fredrickson an update. Please advise.

## 2018-06-17 NOTE — Telephone Encounter (Signed)
Spoke with pt and let him know Dr. Vena Rua recommendations. Pt states he will try taking the amitiza bid and let us know if this helps.

## 2018-06-29 ENCOUNTER — Telehealth: Payer: Self-pay

## 2018-06-29 ENCOUNTER — Inpatient Hospital Stay: Payer: Medicare Other | Attending: Oncology | Admitting: Oncology

## 2018-06-29 DIAGNOSIS — D649 Anemia, unspecified: Secondary | ICD-10-CM

## 2018-06-29 DIAGNOSIS — Z7982 Long term (current) use of aspirin: Secondary | ICD-10-CM

## 2018-06-29 DIAGNOSIS — D72819 Decreased white blood cell count, unspecified: Secondary | ICD-10-CM

## 2018-06-29 DIAGNOSIS — E039 Hypothyroidism, unspecified: Secondary | ICD-10-CM | POA: Diagnosis not present

## 2018-06-29 DIAGNOSIS — Z87891 Personal history of nicotine dependence: Secondary | ICD-10-CM

## 2018-06-29 DIAGNOSIS — D518 Other vitamin B12 deficiency anemias: Secondary | ICD-10-CM

## 2018-06-29 DIAGNOSIS — Z79899 Other long term (current) drug therapy: Secondary | ICD-10-CM

## 2018-06-29 NOTE — Telephone Encounter (Signed)
PRINTED AVS AND CALENDER OF UPCOMING APPOINTMENT. PER 7/31 LOS

## 2018-06-29 NOTE — Progress Notes (Signed)
Reason for the request: Leukocytopenia.     HPI: I was asked by Dr. Hilarie Fredrickson to evaluate Albert Allen for leukocytopenia.  He is a pleasant 84 year old gentleman with history of coronary artery disease, among other comorbid conditions.  He was seen by Dr. Hilarie Fredrickson on June 06, 2018 with complaints of abdominal discomfort and nausea.  Operatory testing obtained at that time showed a white cell count of 3.1, hemoglobin of 12.8 with a platelet count of 164.  He had mild lymphocytosis and mild neutropenia but no other abnormalities noted.  His white cell count has fluctuated based on laboratory testing dating back to 2009 with white cell count have ranged as low as 3.6 to a normal range.  He denies any personal or family history of any blood disorder.  Clinically, he has constellation of chronic symptoms that have not really changed dramatically including mild nausea in the morning time associated with anxiety.  And also have had chronic back pain related to degenerative arthritis.  He denies any weight loss or excessive fatigue.  He continues to drive and attends activities of daily living without any decline.  Despite his age is very active and work outside the house extensively.  He does not report any headaches, blurry vision, syncope or seizures. Does not report any fevers, chills or sweats.  Does not report any cough, wheezing or hemoptysis.  Does not report any chest pain, palpitation, orthopnea or leg edema.  Does not report any nausea, vomiting or abdominal pain.  Does not report any constipation or diarrhea.  Does not report any skeletal complaints.    Does not report frequency, urgency or hematuria.  Does not report any skin rashes or lesions. Does not report any heat or cold intolerance.  Does not report any lymphadenopathy or petechiae.  Does not report any anxiety or depression.  Remaining review of systems is negative.    Past Medical History:  Diagnosis Date  . ABNORMAL CV (STRESS) TEST 03/05/2009  .  ABUSE, ALCOHOL, IN REMISSION 05/31/2007  . Adhesive capsulitis of shoulder 12/29/2007  . Alcoholic polyneuropathy ([place]) 03/29/2008  . ANEMIA, B12 DEFICIENCY 03/29/2008  . Anxiety state, unspecified 11/19/2008  . ASTHMA 05/31/2007  . CAD, NATIVE VESSEL 04/04/2009  . CARPAL TUNNEL SYNDROME, RIGHT 09/05/2007  . Chronic prostatitis 03/29/2008  . Crohn's disease ([place])   . Degenerative disc disease, cervical   . DEGENERATIVE DISC DISEASE, CERVICAL SPINE 06/22/2007  . Diverticulosis   . DYSPHAGIA UNSPECIFIED 09/25/2008  . Esophageal stricture   . External hemorrhoids   . GERD 05/31/2007  . GOUT 05/31/2007  . Hepatomegaly 09/25/2008  . Hiatal hernia   . HIP PAIN 03/20/2010  . History of echocardiogram    Echo 4/17 - mild LVH, EF 50-55%, Gr 1 DD, trivial AI, MAC, mild MR  . HYPERLIPIDEMIA 03/27/2009  . HYPERTENSION 05/31/2007  . HYPOTHYROIDISM 05/31/2007  . LEFT BUNDLE BRANCH BLOCK 02/11/2009  . Liver mass, left lobe   . LOC OSTEOARTHROS NOT SPEC PRIM/SEC LOWER LEG 03/20/2010  . OSTEOARTHRITIS 05/31/2007  . PERSONAL HX COLONIC POLYPS 03/05/1997   adenomatous   . PSA, INCREASED 03/20/2010  . SINUSITIS, CHRONIC NOS 05/31/2007  . Sore throat    CURRENTLY  . SYNCOPE 11/19/2008  . WEIGHT LOSS, RECENT 11/19/2008  :  Past Surgical History:  Procedure Laterality Date  . carpel tunnel release    . EYE SURGERY    . INGUINAL HERNIA REPAIR  12/18/2015  . INGUINAL HERNIA REPAIR Right 12/18/2015   Procedure: OPEN REPAIR  RIGHT INGUINAL HERNIA ;  Surgeon: Greer Pickerel, MD;  Location: [facility];  Service: General;  Laterality: Right;  . INSERTION OF MESH Right 12/18/2015   Procedure: INSERTION OF MESH;  Surgeon: Greer Pickerel, MD;  Location: [facility];  Service: General;  Laterality: Right;  . ORIF ULNAR FRACTURE  11/03/2012   Procedure: OPEN REDUCTION INTERNAL FIXATION (ORIF) ULNAR FRACTURE;  Surgeon: Hessie Dibble, MD;  Location: [facility];  Service: Orthopedics;  Laterality: Right;  ORIF radius and ulnar fractures  . pul. colapse  w/chest tube    . TONSILLECTOMY    :   Current Outpatient Medications:  .  ALPRAZolam (XANAX) 0.5 MG tablet, Take 1 tablet (0.5 mg total) by mouth at bedtime., Disp: 30 tablet, Rfl: 2 .  aspirin EC 81 MG tablet, Take 1 tablet (81 mg total) by mouth daily., Disp: 90 tablet, Rfl: 3 .  cetirizine (ZYRTEC) 5 MG tablet, Take 1 tablet (5 mg total) by mouth daily., Disp: 30 tablet, Rfl: 2 .  levothyroxine (SYNTHROID, LEVOTHROID) 75 MCG tablet, Take 1 tablet (75 mcg total) by mouth daily before breakfast., Disp: 90 tablet, Rfl: 3 .  lubiprostone (AMITIZA) 8 MCG capsule, Take 1 capsule (8 mcg total) by mouth 2 (two) times daily with a meal., Disp: 60 capsule, Rfl: 3 .  olmesartan (BENICAR) 40 MG tablet, Take 1 tablet (40 mg total) by mouth daily., Disp: 90 tablet, Rfl: 3 .  pantoprazole (PROTONIX) 40 MG tablet, Take 1 tablet (40 mg total) by mouth daily., Disp: 90 tablet, Rfl: 3 .  pravastatin (PRAVACHOL) 40 MG tablet, TAKE 1 TABLET EVERY EVENING, Disp: 90 tablet, Rfl: 1 .  PROAIR HFA 108 (90 Base) MCG/ACT inhaler, INHALE 2 PUFFS EVERY SIX HOURS AS NEEDED FOR WHEEZING AND SHORT OF BREATH, Disp: 25.5 g, Rfl: 0 .  silodosin (RAPAFLO) 4 MG CAPS capsule, Take 4 mg by mouth daily with breakfast., Disp: , Rfl: :  Allergies  Allergen Reactions  . Amoxicillin Swelling and Other (See Comments)    Pt has swelling with PCN's  . Penicillins Swelling    Lips swelling Has patient had a PCN reaction causing immediate rash, facial/tongue/throat swelling, SOB or lightheadedness with hypotension: No Has patient had a PCN reaction causing severe rash involving mucus membranes or skin necrosis: No Has patient had a PCN reaction that required hospitalization No Has patient had a PCN reaction occurring within the last 10 years: No If all of the above answers are "NO", then may proceed with Cephalosporin use.  Blair Dolphin [Cefaclor] Other (See Comments)    Pt does not remember reaction  . Celebrex [Celecoxib] Other (See  Comments)    Pt does not remember reaction  . Levaquin [Levofloxacin In D5w] Other (See Comments)    Pt does not remember reaction  :  Family History  Problem Relation Age of Onset  . Cancer Mother        In back but unsure of what type of cancer  . Alcohol abuse Father   . Lung disease Father        abscess  . Diabetes Sister   . Cancer Brother        throat  . Diabetes Brother   . Diabetes Sister   . Colon cancer Neg Hx   :  Social History   Socioeconomic History  . Marital status: Married    Spouse name: Not on file  . Number of children: Not on file  . Years of education: Not on file  .  Highest education level: Not on file  Occupational History  . Occupation: retired    Comment: Oncologist  Social Needs  . Financial resource strain: Not on file  . Food insecurity:    Worry: Not on file    Inability: Not on file  . Transportation needs:    Medical: Not on file    Non-medical: Not on file  Tobacco Use  . Smoking status: Former Smoker    Types: Cigarettes    Last attempt to quit: 04/24/1998    Years since quitting: 20.1  . Smokeless tobacco: Never Used  Substance and Sexual Activity  . Alcohol use: Yes    Alcohol/week: 3.0 oz    Types: 5 Shots of liquor per week    Comment: Does not drink weekly but does buy a pint of liqour occasionally and will drink most of the bottle when he does  . Drug use: No  . Sexual activity: Yes  Lifestyle  . Physical activity:    Days per week: Not on file    Minutes per session: Not on file  . Stress: Not on file  Relationships  . Social connections:    Talks on phone: Not on file    Gets together: Not on file    Attends religious service: Not on file    Active member of club or organization: Not on file    Attends meetings of clubs or organizations: Not on file    Relationship status: Not on file  . Intimate partner violence:    Fear of current or ex partner: Not on file    Emotionally abused: Not on file     Physically abused: No    Forced sexual activity: Not on file  Other Topics Concern  . Not on file  Social History Narrative  . Not on file  :  Pertinent items are noted in HPI.  Exam: Vitals personally reviewed today which showed a blood pressure 141/70, pulse is 83 temperature is 97 and he is afebrile.  ECOG 1 General appearance: alert and cooperative appeared without distress. Head: atraumatic without any abnormalities. Eyes: conjunctivae/corneas clear. PERRL.  Sclera anicteric. Throat: lips, mucosa, and tongue normal; without oral thrush or ulcers. Resp: clear to auscultation bilaterally without rhonchi, wheezes or dullness to percussion. Cardio: regular rate and rhythm, S1, S2 normal, no murmur, click, rub or gallop GI: soft, non-tender; bowel sounds normal; no masses,  no organomegaly Skin: Skin color, texture, turgor normal. No rashes or lesions Lymph nodes: Cervical, supraclavicular, and axillary nodes normal. Neurologic: Grossly normal without any motor, sensory or deep tendon reflexes. Musculoskeletal: No joint deformity or effusion.  CBC    Component Value Date/Time   WBC 3.1 (L) 06/06/2018 1442   RBC 3.70 (L) 06/06/2018 1442   HGB 12.8 (L) 06/06/2018 1442   HGB 12.5 (L) 01/05/2017 0842   HCT 36.8 (L) 06/06/2018 1442   HCT 37.8 01/05/2017 0842   PLT 164.0 06/06/2018 1442   PLT 173 01/05/2017 0842   MCV 99.4 06/06/2018 1442   MCV 102 (H) 01/05/2017 0842   MCH 33.7 (H) 01/05/2017 0842   MCH 34.2 (H) 07/01/2016 1042   MCHC 34.7 06/06/2018 1442   RDW 13.4 06/06/2018 1442   RDW 13.8 01/05/2017 0842   LYMPHSABS 1.5 06/06/2018 1442   LYMPHSABS 1.0 01/05/2017 0842   MONOABS 0.7 06/06/2018 1442   EOSABS 0.1 06/06/2018 1442   EOSABS 0.0 01/05/2017 0842   BASOSABS 0.0 06/06/2018 1442   BASOSABS 0.0 01/05/2017  [id number]  '  Dg Esophagus  Result Date: 06/13/2018 CLINICAL DATA:  Coughing while eating.  Nausea every morning. EXAM: ESOPHOGRAM / BARIUM SWALLOW / BARIUM  TABLET STUDY TECHNIQUE: Combined double contrast and single contrast examination performed using effervescent crystals, thick barium liquid, and thin barium liquid. The patient was observed with fluoroscopy swallowing a 13 mm barium sulphate tablet. FLUOROSCOPY TIME:  Fluoroscopy Time:  1 minutes 6 seconds Radiation Exposure Index (if provided by the fluoroscopic device): 8.3 mGy Number of Acquired Spot Images: 0 COMPARISON:  None. FINDINGS: The esophagus is patent. No stricture or mass identified. A small Zenker's diverticulum is identified measuring 4 mm. The patient ingested a 13 mm barium tablet which easily passed through the esophagus and into the stomach. The motility of the esophagus appears normal. The patient has a moderate size hiatal hernia. Multiple episodes of high-grade reflux identified. IMPRESSION: 1. Moderate size hiatal hernia with high-grade reflux. 2. Small Zenker's diverticulum. Electronically Signed   By: Kerby Moors M.D.   On: 06/13/2018 11:26    Assessment and Plan:    84 year old man with the following:  1.  Leukocytopenia and mild neutropenia: These findings appear to be mild and chronic in nature.  These has fluctuated within normal range and abnormal [place] low dating back to 2009.  The differential diagnosis was reviewed today with the patient and his family.  These findings could be related to benign cyclic neutropenia versus medication effect.  Autoimmune disorder be also a consideration.  Myelodysplastic syndrome or other hematological disorder considered less likely.  For management standpoint, I have recommended continued observation and surveillance and repeat laboratory testing in 4 months.  We will obtain B12 levels as well to see if this could be contributing to his mild leukocytopenia.  2.  Mild anemia: His hemoglobin is 12.8 with a normal F64 and folic acid obtained on 06/06/2018.  We will continue to monitor his CBC at this time.  Does not require any further  anemia work-up.  3.  Follow-up: We will be in the next 4 months to follow his progress and repeat laboratory testing.  30  minutes was spent with the patient face-to-face today.  More than 50% of time was dedicated to patient counseling, education and discussing the differential diagnosis and management option for his condition.   Thank you for the referral.  A copy of this consult has been forwarded to the requesting physician.

## 2018-07-21 ENCOUNTER — Telehealth: Payer: Self-pay | Admitting: Internal Medicine

## 2018-07-21 MED ORDER — LUBIPROSTONE 8 MCG PO CAPS: 8.0000 ug | ORAL_CAPSULE | Freq: Two times a day (BID) | ORAL | 1 refills | Status: DC

## 2018-07-21 NOTE — Telephone Encounter (Signed)
Patient son calling to state patient has ran out of medication amitiza and would like a new prescription sent to [facility] due to cost. Patient son states medication is really helping the patient and would like a call when this is sent in so he can pay for it instead of the patient.

## 2018-07-21 NOTE — Telephone Encounter (Signed)
Rx sent to [facility]

## 2018-07-26 ENCOUNTER — Ambulatory Visit (INDEPENDENT_AMBULATORY_CARE_PROVIDER_SITE_OTHER): Payer: Medicare Other | Admitting: Family Medicine

## 2018-07-26 ENCOUNTER — Encounter: Payer: Self-pay | Admitting: Family Medicine

## 2018-07-26 VITALS — BP 126/65 | HR 88 | Ht 66.5 in | Wt 134.5 lb

## 2018-07-26 DIAGNOSIS — J301 Allergic rhinitis due to pollen: Secondary | ICD-10-CM

## 2018-07-26 DIAGNOSIS — Z0000 Encounter for general adult medical examination without abnormal findings: Secondary | ICD-10-CM | POA: Diagnosis not present

## 2018-07-26 DIAGNOSIS — J206 Acute bronchitis due to rhinovirus: Secondary | ICD-10-CM

## 2018-07-26 DIAGNOSIS — F411 Generalized anxiety disorder: Secondary | ICD-10-CM

## 2018-07-26 DIAGNOSIS — Z1321 Encounter for screening for nutritional disorder: Secondary | ICD-10-CM

## 2018-07-26 MED ORDER — CETIRIZINE HCL 5 MG PO TABS: 5.0000 mg | ORAL_TABLET | Freq: Every day | ORAL | 1 refills | Status: DC

## 2018-07-27 LAB — CMP14+EGFR
ALBUMIN: 4.3 g/dL (ref 3.2–4.6)
ALT: 9 IU/L (ref 0–44)
AST: 10 IU/L (ref 0–40)
Albumin/Globulin Ratio: 2 (ref 1.2–2.2)
Alkaline Phosphatase: 84 IU/L (ref 39–117)
BUN / CREAT RATIO: 14 (ref 10–24)
BUN: 14 mg/dL (ref 10–36)
Bilirubin Total: 0.4 mg/dL (ref 0.0–1.2)
CALCIUM: 8.8 mg/dL (ref 8.6–10.2)
CO2: 22 mmol/L (ref 20–29)
Chloride: 99 mmol/L (ref 96–106)
Creatinine, Ser: 1.01 mg/dL (ref 0.76–1.27)
GFR, EST AFRICAN AMERICAN: 74 mL/min/{1.73_m2} (ref >59)
GFR, EST NON AFRICAN AMERICAN: 64 mL/min/{1.73_m2} (ref >59)
GLOBULIN, TOTAL: 2.2 g/dL (ref 1.5–4.5)
Glucose: 114 mg/dL — ABNORMAL HIGH (ref 65–99)
Potassium: 4.4 mmol/L (ref 3.5–5.2)
SODIUM: 137 mmol/L (ref 134–144)
TOTAL PROTEIN: 6.5 g/dL (ref 6.0–8.5)

## 2018-07-27 LAB — CBC WITH DIFFERENTIAL/PLATELET
Basophils Absolute: 0.1 10*3/uL (ref 0.0–0.2)
Basos: 2 %
EOS (ABSOLUTE): 0 10*3/uL (ref 0.0–0.4)
Eos: 1 %
HEMATOCRIT: 35.6 % — AB (ref 37.5–51.0)
HEMOGLOBIN: 12.1 g/dL — AB (ref 13.0–17.7)
LYMPHS ABS: 1.3 10*3/uL (ref 0.7–3.1)
Lymphs: 35 %
MCH: 33.5 pg — ABNORMAL HIGH (ref 26.6–33.0)
MCHC: 34 g/dL (ref 31.5–35.7)
MCV: 99 fL — AB (ref 79–97)
Monocytes Absolute: 0.6 10*3/uL (ref 0.1–0.9)
Monocytes: 15 %
NEUTROS ABS: 1.7 10*3/uL (ref 1.4–7.0)
Neutrophils: 46 %
Platelets: 154 10*3/uL (ref 150–450)
RBC: 3.61 x10E6/uL — AB (ref 4.14–5.80)
RDW: 12.9 % (ref 12.3–15.4)
WBC: 3.7 10*3/uL (ref 3.4–10.8)

## 2018-07-27 LAB — IMMATURE CELLS: METAMYELOCYTES: 1 % — AB (ref 0–0)

## 2018-07-27 LAB — VITAMIN D 25 HYDROXY (VIT D DEFICIENCY, FRACTURES): Vit D, 25-Hydroxy: 35.6 ng/mL (ref 30.0–100.0)

## 2018-07-28 ENCOUNTER — Encounter: Payer: Self-pay | Admitting: *Deleted

## 2018-07-28 ENCOUNTER — Ambulatory Visit (INDEPENDENT_AMBULATORY_CARE_PROVIDER_SITE_OTHER): Payer: Medicare Other | Admitting: *Deleted

## 2018-07-28 VITALS — BP 117/60 | HR 77 | Ht 66.5 in | Wt 135.0 lb

## 2018-07-28 DIAGNOSIS — Z0000 Encounter for general adult medical examination without abnormal findings: Secondary | ICD-10-CM

## 2018-07-28 NOTE — Progress Notes (Signed)
Subjective:   Albert Allen is a 84 y.o. male who presents for a Medicare Annual Wellness Visit. Albert Allen lives at home with his wife. His wife has dementia and he helps to care for her. He can live her alone for short periods of time but he typically has her with him. He fixes the meals and takes care of the housework and yardwork. They have two adult sons who check on them and visit regularly.   Review of Systems    Patient reports that his overall health is unchanged compared to last year.  Cardiac: advanced age, hypertension  Psych: resting better since wife had catheter inserted and she isn't getting up a lot during the night. Does have significant stress because of her dementia and dependence on him.   All other systems negative       Current Medications (verified) Outpatient Encounter Medications as of 07/28/2018  Medication Sig  . ALPRAZolam (XANAX) 0.5 MG tablet Take 1 tablet (0.5 mg total) by mouth at bedtime.  Marland Kitchen aspirin EC 81 MG tablet Take 1 tablet (81 mg total) by mouth daily.  . cetirizine (ZYRTEC) 5 MG tablet Take 1 tablet (5 mg total) by mouth daily.  Marland Kitchen levothyroxine (SYNTHROID, LEVOTHROID) 75 MCG tablet Take 1 tablet (75 mcg total) by mouth daily before breakfast.  . lubiprostone (AMITIZA) 8 MCG capsule Take 1 capsule (8 mcg total) by mouth 2 (two) times daily with a meal.  . olmesartan (BENICAR) 40 MG tablet Take 1 tablet (40 mg total) by mouth daily.  . pantoprazole (PROTONIX) 40 MG tablet Take 1 tablet (40 mg total) by mouth daily.  Marland Kitchen PROAIR HFA 108 (90 Base) MCG/ACT inhaler INHALE 2 PUFFS EVERY SIX HOURS AS NEEDED FOR WHEEZING AND SHORT OF BREATH  . silodosin (RAPAFLO) 4 MG CAPS capsule Take 4 mg by mouth daily with breakfast.  . [DISCONTINUED] ALPRAZolam (XANAX) 0.5 MG tablet TAKE ONE TABLET AT BEDTIME  . [DISCONTINUED] cetirizine (ZYRTEC) 5 MG tablet Take 1 tablet (5 mg total) by mouth daily.  . [DISCONTINUED] fluticasone (FLONASE) 50 MCG/ACT  nasal spray Place 2 sprays into both nostrils daily.  . [DISCONTINUED] nitroGLYCERIN (NITROSTAT) 0.4 MG SL tablet Place 1 tablet (0.4 mg total) under the tongue every 5 (five) minutes as needed for chest pain.  . [DISCONTINUED] pravastatin (PRAVACHOL) 40 MG tablet TAKE 1 TABLET EVERY EVENING   No facility-administered encounter medications on file as of 07/28/2018.     Allergies (verified) Amoxicillin; Penicillins; Ceclor [cefaclor]; Celebrex [celecoxib]; and Levaquin [levofloxacin in d5w]   History: Past Medical History:  Diagnosis Date  . ABNORMAL CV (STRESS) TEST 03/05/2009  . ABUSE, ALCOHOL, IN REMISSION 05/31/2007  . Adhesive capsulitis of shoulder 12/29/2007  . Alcoholic polyneuropathy ([place]) 03/29/2008  . ANEMIA, B12 DEFICIENCY 03/29/2008  . Anxiety state, unspecified 11/19/2008  . ASTHMA 05/31/2007  . CAD, NATIVE VESSEL 04/04/2009  . CARPAL TUNNEL SYNDROME, RIGHT 09/05/2007  . Chronic prostatitis 03/29/2008  . Crohn's disease ([place])   . Degenerative disc disease, cervical   . DEGENERATIVE DISC DISEASE, CERVICAL SPINE 06/22/2007  . Diverticulosis   . DYSPHAGIA UNSPECIFIED 09/25/2008  . Esophageal stricture   . External hemorrhoids   . GERD 05/31/2007  . GOUT 05/31/2007  . Hepatomegaly 09/25/2008  . Hiatal hernia   . HIP PAIN 03/20/2010  . History of echocardiogram    Echo 4/17 - mild LVH, EF 50-55%, Gr 1 DD, trivial AI, MAC, mild MR  . HYPERLIPIDEMIA 03/27/2009  . HYPERTENSION  05/31/2007  . HYPOTHYROIDISM 05/31/2007  . LEFT BUNDLE BRANCH BLOCK 02/11/2009  . Liver mass, left lobe   . LOC OSTEOARTHROS NOT SPEC PRIM/SEC LOWER LEG 03/20/2010  . OSTEOARTHRITIS 05/31/2007  . PERSONAL HX COLONIC POLYPS 03/05/1997   adenomatous   . PSA, INCREASED 03/20/2010  . SINUSITIS, CHRONIC NOS 05/31/2007  . Sore throat    CURRENTLY  . SYNCOPE 11/19/2008  . WEIGHT LOSS, RECENT 11/19/2008   Past Surgical History:  Procedure Laterality Date  . carpel tunnel release    . EYE SURGERY    . INGUINAL HERNIA REPAIR   12/18/2015  . INGUINAL HERNIA REPAIR Right 12/18/2015   Procedure: OPEN REPAIR RIGHT INGUINAL HERNIA ;  Surgeon: Greer Pickerel, MD;  Location: [facility];  Service: General;  Laterality: Right;  . INSERTION OF MESH Right 12/18/2015   Procedure: INSERTION OF MESH;  Surgeon: Greer Pickerel, MD;  Location: [facility];  Service: General;  Laterality: Right;  . ORIF ULNAR FRACTURE  11/03/2012   Procedure: OPEN REDUCTION INTERNAL FIXATION (ORIF) ULNAR FRACTURE;  Surgeon: Hessie Dibble, MD;  Location: [facility];  Service: Orthopedics;  Laterality: Right;  ORIF radius and ulnar fractures  . pul. colapse w/chest tube    . TONSILLECTOMY     Family History  Problem Relation Age of Onset  . Cancer Mother        In back but unsure of what type of cancer  . Alcohol abuse Father   . Lung disease Father        abscess  . Diabetes Sister   . Cancer Brother        throat  . Diabetes Brother   . Diabetes Sister   . Colon cancer Neg Hx    Social History   Socioeconomic History  . Marital status: Married    Spouse name: Not on file  . Number of children: 2  . Years of education: 9  . Highest education level: 9th grade  Occupational History  . Occupation: retired    Comment: Oncologist  Social Needs  . Financial resource strain: Not hard at all  . Food insecurity:    Worry: Never true    Inability: Never true  . Transportation needs:    Medical: No    Non-medical: No  Tobacco Use  . Smoking status: Former Smoker    Types: Cigarettes    Last attempt to quit: 04/24/1998    Years since quitting: 20.2  . Smokeless tobacco: Never Used  Substance and Sexual Activity  . Alcohol use: Yes    Alcohol/week: 5.0 standard drinks    Types: 5 Shots of liquor per week    Comment: Does not drink weekly but does buy a pint of liqour occasionally and will drink most of the bottle when he does  . Drug use: No  . Sexual activity: Yes  Lifestyle  . Physical activity:    Days per week: 7 days    Minutes per session:  30 min  . Stress: Very much  Relationships  . Social connections:    Talks on phone: More than three times a week    Gets together: More than three times a week    Attends religious service: More than 4 times per year    Active member of club or organization: Yes    Attends meetings of clubs or organizations: More than 4 times per year    Relationship status: Married  Other Topics Concern  . Not on file  Social History Narrative  . Not on file    Tobacco Use No.  Clinical Intake:  Pre-visit preparation completed: No  Pain : No/denies pain     Nutritional Status: BMI of 19-24  Normal Diabetes: No  How often do you need to have someone help you when you read instructions, pamphlets, or other written materials from your doctor or pharmacy?: 1 - Never What is the last grade level you completed in school?: 9  Interpreter Needed?: No  Information entered by :: Chong Sicilian, RN  Activities of Daily Living No flowsheet data found.   Diet 3 meals a day. He prepares most of his own meals.   Exercise   No formal exercise but he stays busy around his home and yard   Depression Screen PHQ 2/9 Scores 04/01/2018 01/17/2018 12/09/2017 10/18/2017  PHQ - 2 Score 0 0 0 0     Fall Risk Fall Risk  04/01/2018 01/17/2018 12/09/2017 10/18/2017 09/22/2017  Falls in the past year? _0   Comment - - - - -  Number falls in past yr: - - - - -  Injury with Fall? - - - - -  Risk Factor Category  - - - - -  Risk for fall due to : - - - - -  Risk for fall due to: Comment - - - - -  Follow up - - - - -  Comment - - - - -    Safety Is the patient's home free of loose throw rugs in walkways, pet beds, electrical cords, etc?   yes      Grab bars in the bathroom? yes      Walkin shower? no      Shower Seat? no      Handrails on the stairs?   yes      Adequate lighting?   yes  Patient Care Team: [name], MD as PCP - General (Family Medicine) Pyrtle, Lajuan Lines, MD as  Consulting Physician (Gastroenterology) Burnell Blanks, MD as Consulting Physician (Cardiology) Melissa Montane, MD as Consulting Physician (Otolaryngology) Myrlene Broker, MD as Attending Physician (Urology) Myrlene Broker, MD as Attending Physician (Urology) Melina Schools, OD (Optometry) Burnell Blanks, MD as Consulting Physician (Cardiology)  Hospitalizations, surgeries, and ER visits in previous 12 months No hospitalizations, ER visits, or surgeries this past year.   Objective:    Today's Vitals   07/28/18 0843  BP: 117/60  Pulse: 77  Weight: 135 lb (61.2 kg)  Height: 5' 6.5" (1.689 m)   Body mass index is 21.46 kg/m.  Advanced Directives 07/28/2018 07/01/2016 02/06/2016 12/18/2015 11/29/2015 04/25/2015 07/13/2014  Does Patient Have a Medical Advance Directive? No _1  Yes  Type of Advance Directive - Living will;Healthcare Power of Attorney Living will [place];Living will - Living will;Healthcare Power of Attorney -  Does patient want to make changes to medical advance directive? - - - No - Patient declined - No - Patient declined -  Copy of [place] in Chart? - - - No - copy requested No - copy requested No - copy requested -  Would patient like information on creating a medical advance directive? No - Patient declined - - - - - -    Hearing/Vision  No hearing or vision deficits noted during visit.   Cognitive Function: MMSE - Mini Mental State Exam 07/28/2018 07/01/2017 04/25/2015  Orientation to time 4  5 5  Orientation to Place _0 Registration _1 Attention/ Calculation _2 Recall _3 Language- name 2 objects _4 Language- repeat _5 Language- follow 3 step command _6 Language- read & follow direction _7 Write a sentence _8 Copy design _9 Total score _10 Normal Cognitive Function Screening: Yes    Immunizations and Health  Maintenance Immunization History  Administered Date(s) Administered  . Influenza Split 09/24/2011, 09/22/2012  . Influenza Whole 09/05/2007, 08/21/2008, 09/25/2009, 09/25/2010  . Influenza, High Dose Seasonal PF 10/02/2013, 09/15/2017  . Influenza,inj,Quad PF,6+ Mos 09/12/2015, 09/07/2016  . Influenza-Unspecified 08/22/2014  . Pneumococcal Conjugate-13 04/25/2015  . Pneumococcal Polysaccharide-23 02/11/2009  . Td 11/30/2005   Health Maintenance Due  Topic Date Due  . INFLUENZA VACCINE  06/30/2018   Health Maintenance  Topic Date Due  . INFLUENZA VACCINE  06/30/2018  . TETANUS/TDAP  01/17/2019 (Originally 12/01/2015)  . PNA vac Low Risk Adult  Completed        Assessment:   This is a routine wellness examination for [place].      Plan:    Goals    . Exercise 3x per week (30 min per time)    . Increase water intake        Additional Screening Recommendations: Lung: Low Dose CT Chest recommended if Age 66-80 years, 30 pack-year currently smoking OR have quit w/in 15years. Patient does not qualify. Hepatitis C Screening recommended: no  Today's Orders No orders of the defined types were placed in this encounter.   Keep f/u with [name], MD and any other specialty appointments you may have Continue current medications Move carefully to avoid falls. Use assistive devices like a cane or walker if needed. Aim for at least 150 minutes of moderate activity a week. This can be chair exercises if necessary. Reading or puzzles are a good way to exercise your brain Stay connected with friends and family. Social connections are beneficial to your emotional and mental health.  Ask for help if needed to help with caregiver strain.   I have personally reviewed and noted the following in the patient's chart:   . Medical and social history . Use of alcohol, tobacco or illicit drugs  . Current medications and supplements . Functional ability and status . Nutritional  status . Physical activity . Advanced directives . List of other physicians . Hospitalizations, surgeries, and ER visits in previous 12 months . Vitals . Screenings to include cognitive, depression, and falls . Referrals and appointments  In addition, I have reviewed and discussed with patient certain preventive protocols, quality metrics, and best practice recommendations. A written personalized care plan for preventive services as well as general preventive health recommendations were provided to patient.     Chong Sicilian, RN   07/28/2018

## 2018-07-28 NOTE — Patient Instructions (Signed)
  Albert Allen , Thank you for taking time to come for your Medicare Wellness Visit. I appreciate your ongoing commitment to your health goals. Please review the following plan we discussed and let me know if I can assist you in the future.   These are the goals we discussed: Goals    . Exercise 3x per week (30 min per time)    . Increase water intake       This is a list of the screening recommended for you and due dates:  Health Maintenance  Topic Date Due  . Flu Shot  06/30/2018  . Tetanus Vaccine  01/17/2019*  . Pneumonia vaccines  Completed  *Topic was postponed. The date shown is not the original due date.    Ask for help if you need it so you don't get too stressed out.

## 2018-08-01 ENCOUNTER — Encounter: Payer: Self-pay | Admitting: Family Medicine

## 2018-08-01 MED ORDER — ALPRAZOLAM 0.5 MG PO TABS: 0.5000 mg | ORAL_TABLET | Freq: Every day | ORAL | 5 refills | Status: DC

## 2018-08-01 NOTE — Progress Notes (Signed)
Subjective:  Patient ID: Albert Allen, male    DOB: [birth year]  Age: 84 y.o. MRN: [id number]  CC: Annual Allen   HPI Albert Allen presents for Annual Allen. Suffers from chronic anxiety caused by being caregiver of demented wife. He is 84 years old, but able to take care of himself. Stressed bby additional duties of caring for spouse.  Depression screen [facility] 2/9 04/01/2018 01/17/2018 12/09/2017  Decreased Interest 0 0 0  Down, Depressed, Hopeless 0 0 0  PHQ - 2 Score 0 0 0    History Albert Allen has a past medical history of ABNORMAL CV (STRESS) TEST (03/05/2009), ABUSE, ALCOHOL, IN REMISSION (05/31/2007), Adhesive capsulitis of shoulder (0/17/7939), Alcoholic polyneuropathy ([place]) (03/29/2008), ANEMIA, B12 DEFICIENCY (03/29/2008), Anxiety state, unspecified (11/19/2008), ASTHMA (05/31/2007), CAD, NATIVE VESSEL (04/04/2009), CARPAL TUNNEL SYNDROME, RIGHT (09/05/2007), Chronic prostatitis (03/29/2008), Crohn's disease ([place]), Degenerative disc disease, cervical, DEGENERATIVE DISC DISEASE, CERVICAL SPINE (06/22/2007), Diverticulosis, DYSPHAGIA UNSPECIFIED (09/25/2008), Esophageal stricture, External hemorrhoids, GERD (05/31/2007), GOUT (05/31/2007), Hepatomegaly (09/25/2008), Hiatal hernia, HIP PAIN (03/20/2010), History of echocardiogram, HYPERLIPIDEMIA (03/27/2009), HYPERTENSION (05/31/2007), HYPOTHYROIDISM (05/31/2007), LEFT BUNDLE BRANCH BLOCK (02/11/2009), Liver mass, left lobe, LOC OSTEOARTHROS NOT SPEC PRIM/SEC LOWER LEG (03/20/2010), OSTEOARTHRITIS (05/31/2007), PERSONAL HX COLONIC POLYPS (03/05/1997), PSA, INCREASED (03/20/2010), SINUSITIS, CHRONIC NOS (05/31/2007), Sore throat, SYNCOPE (11/19/2008), and WEIGHT LOSS, RECENT (11/19/2008).   He has a past surgical history that includes pul. colapse w/chest tube; Tonsillectomy; carpel tunnel release; ORIF ulnar fracture (11/03/2012); Eye surgery; Inguinal hernia repair (12/18/2015); Inguinal hernia repair (Right, 12/18/2015); and Insertion of mesh (Right, 12/18/2015).   His  family history includes Alcohol abuse in his father; Cancer in his brother and mother; Diabetes in his brother, sister, and sister; Lung disease in his father.He reports that he quit smoking about 20 years ago. His smoking use included cigarettes. He has never used smokeless tobacco. He reports that he drinks about 5.0 standard drinks of alcohol per week. He reports that he does not use drugs.    ROS Review of Systems  Constitutional: Negative.   HENT: Positive for congestion and rhinorrhea.   Eyes: Negative for visual disturbance.  Respiratory: Positive for cough. Negative for shortness of breath.   Cardiovascular: Negative for chest pain and leg swelling.  Gastrointestinal: Negative for abdominal pain, diarrhea, nausea and vomiting.  Genitourinary: Negative for difficulty urinating.  Musculoskeletal: Negative for arthralgias and myalgias.  Skin: Negative for rash.  Neurological: Negative for headaches.  Psychiatric/Behavioral: Negative for sleep disturbance.    Objective:  BP 126/65   Pulse 88   Ht 5' 6.5" (1.689 m)   Wt 134 lb 8 oz (61 kg)   BMI 21.38 kg/m   BP Readings from Last 3 Encounters:  07/28/18 117/60  07/26/18 126/65  06/06/18 124/64    Wt Readings from Last 3 Encounters:  07/28/18 135 lb (61.2 kg)  07/26/18 134 lb 8 oz (61 kg)  06/06/18 134 lb (60.8 kg)     Physical Allen  Constitutional: He is oriented to person, place, and time. He appears well-developed and well-nourished. No distress.  HENT:  Head: Normocephalic and atraumatic.  Right Ear: External ear normal.  Left Ear: External ear normal.  Nose: Nose normal.  Mouth/Throat: Oropharynx is clear and moist.  Eyes: Pupils are equal, round, and reactive to light. Conjunctivae and EOM are normal.  Neck: Normal range of motion. Neck supple. No tracheal deviation present. No thyromegaly present.  Cardiovascular: Normal rate, regular rhythm and normal heart sounds. Allen reveals no gallop and no friction  rub.  No murmur heard. Pulmonary/Chest: Effort normal and breath sounds normal. No respiratory distress. He has no wheezes. He has no rales.  Abdominal: Soft. Bowel sounds are normal. He exhibits no distension and no mass. There is no tenderness. Hernia confirmed negative in the right inguinal area and confirmed negative in the left inguinal area.  Genitourinary: Testes normal and penis normal.  Musculoskeletal: Normal range of motion. He exhibits no edema.  Lymphadenopathy:    He has no cervical adenopathy.  Neurological: He is alert and oriented to person, place, and time. He has normal reflexes.  Skin: Skin is warm and dry.  Psychiatric: He has a normal mood and affect. His behavior is normal. Judgment and thought content normal.      Assessment & Plan:   Albert Allen.  Diagnoses and all orders for this visit:  Well adult Allen  Acute bronchitis due to Rhinovirus -     CBC with Differential/Platelet -     CMP14+EGFR  Allergic rhinitis due to pollen, unspecified seasonality -     cetirizine (ZYRTEC) 5 MG tablet; Take 1 tablet (5 mg total) by mouth daily.  Encounter for vitamin deficiency screening -     VITAMIN D 25 Hydroxy (Vit-D Deficiency, Fractures)  Other orders -     ALPRAZolam (XANAX) 0.5 MG tablet; Take 1 tablet (0.5 mg total) by mouth at bedtime. -     Immature Cells       I have discontinued Albert Allen Allen. I am also having him maintain his aspirin EC, silodosin, olmesartan, levothyroxine, pantoprazole, PROAIR HFA, lubiprostone, cetirizine, and ALPRAZolam.  Allergies as of 07/26/2018      Reactions   Amoxicillin Swelling, Other (See Comments)   Pt has swelling with PCN's   Penicillins Swelling   Lips swelling Has patient had a PCN reaction causing immediate rash, facial/tongue/throat swelling, SOB or lightheadedness with hypotension: No Has patient had a PCN reaction causing severe rash involving mucus membranes  or skin necrosis: No Has patient had a PCN reaction that required hospitalization No Has patient had a PCN reaction occurring within the last 10 years: No If all of the above answers are "NO", then may proceed with Cephalosporin use.   Ceclor [cefaclor] Other (See Comments)   Pt does not remember reaction   Celebrex [celecoxib] Other (See Comments)   Pt does not remember reaction   Levaquin [levofloxacin In D5w] Other (See Comments)   Pt does not remember reaction      Medication List        Accurate as of 07/26/18 11:59 PM. Always use your most recent med list.          ALPRAZolam 0.5 MG tablet Commonly known as:  XANAX Take 1 tablet (0.5 mg total) by mouth at bedtime.   aspirin EC 81 MG tablet Take 1 tablet (81 mg total) by mouth daily.   cetirizine 5 MG tablet Commonly known as:  ZYRTEC Take 1 tablet (5 mg total) by mouth daily.   levothyroxine 75 MCG tablet Commonly known as:  SYNTHROID, LEVOTHROID Take 1 tablet (75 mcg total) by mouth daily before breakfast.   lubiprostone 8 MCG capsule Commonly known as:  AMITIZA Take 1 capsule (8 mcg total) by mouth 2 (two) times daily with a meal.   olmesartan 40 MG tablet Commonly known as:  BENICAR Take 1 tablet (40 mg total) by mouth daily.   pantoprazole 40 MG tablet Commonly known as:  PROTONIX  Take 1 tablet (40 mg total) by mouth daily.   PROAIR HFA 108 (90 Base) MCG/ACT inhaler Generic drug:  albuterol INHALE 2 PUFFS EVERY SIX HOURS AS NEEDED FOR WHEEZING AND SHORT OF BREATH   silodosin 4 MG Caps capsule Commonly known as:  RAPAFLO Take 4 mg by mouth daily with breakfast.        Follow-up: No follow-ups on file.  [name], M.D.

## 2018-08-12 DIAGNOSIS — N138 Other obstructive and reflux uropathy: Secondary | ICD-10-CM | POA: Diagnosis not present

## 2018-08-13 IMAGING — CT CT ABD-PELV W/ CM
2 of 5 series · 16 of 46 positions shown, 18 images · IV contrast (iopamidol)
Comparison: CT 06/19/2014

CLINICAL DATA: Nausea, lower abdominal pain.  Diverticulosis.

EXAM:
CT ABDOMEN AND PELVIS WITH CONTRAST
TECHNIQUE: Multidetector CT imaging of the abdomen and pelvis was performed
using the standard protocol following bolus administration of
intravenous contrast.
CONTRAST:  100mL [id number] IOPAMIDOL ([id number]) INJECTION 61%

[Series 2: routine abd pel with · axial · 0.64mm/px · z∈[-456,-72]mm · 13 of 89 slices shown, 15 images]
[im 6/89  soft-tissue]
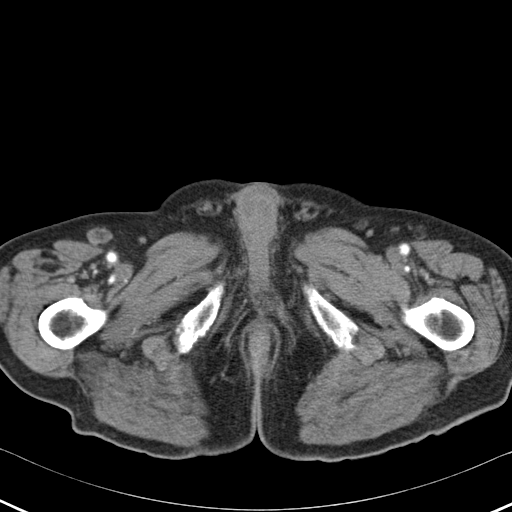
[im 6/89  bone]
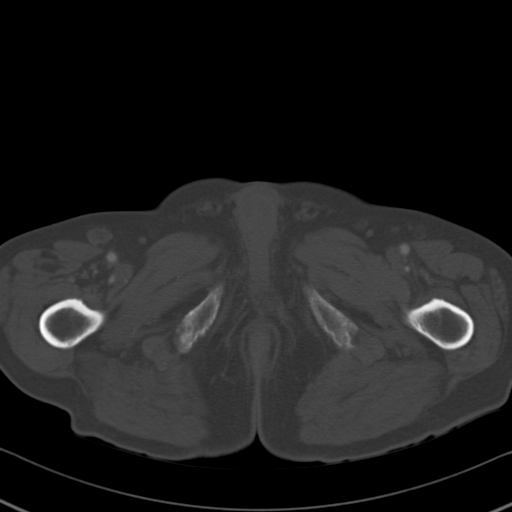
[im 11/89  soft-tissue]
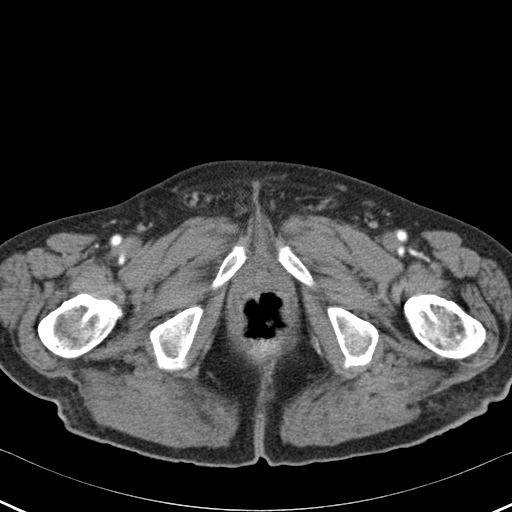
[im 21/89  soft-tissue]
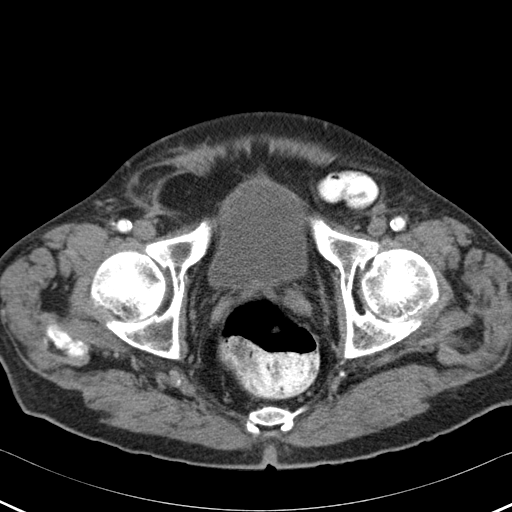
[im 26/89  soft-tissue]
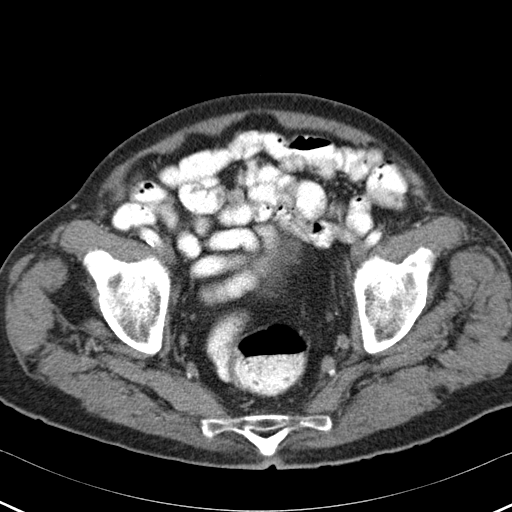
[im 32/89  soft-tissue]
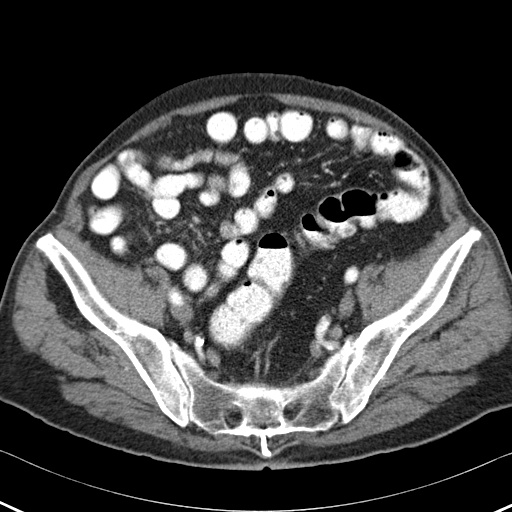
[im 37/89  soft-tissue]
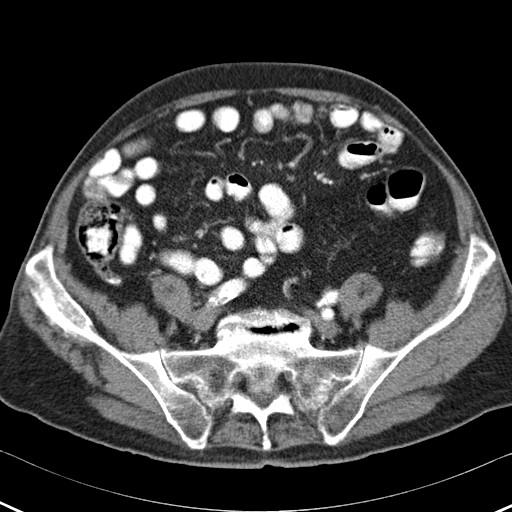
[im 47/89  soft-tissue]
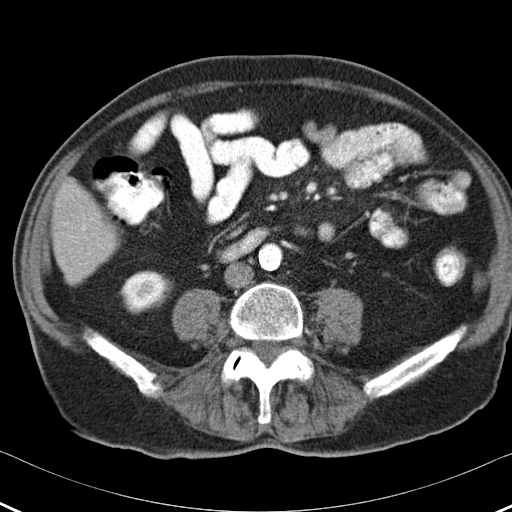
[im 52/89  soft-tissue]
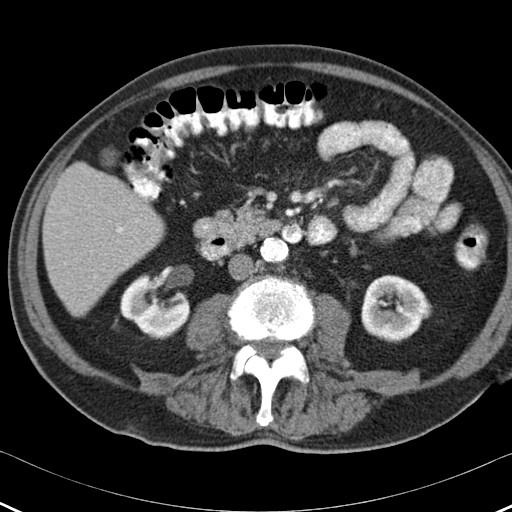
[im 57/89  soft-tissue]
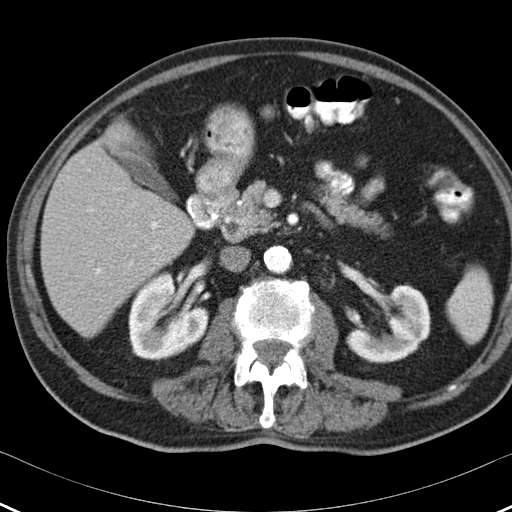
[im 57/89  bone]
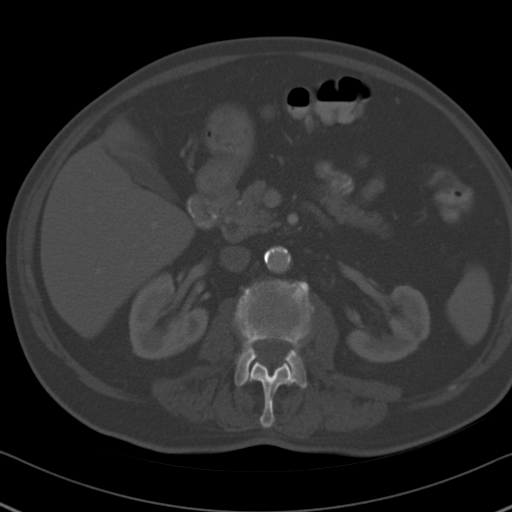
[im 63/89  soft-tissue]
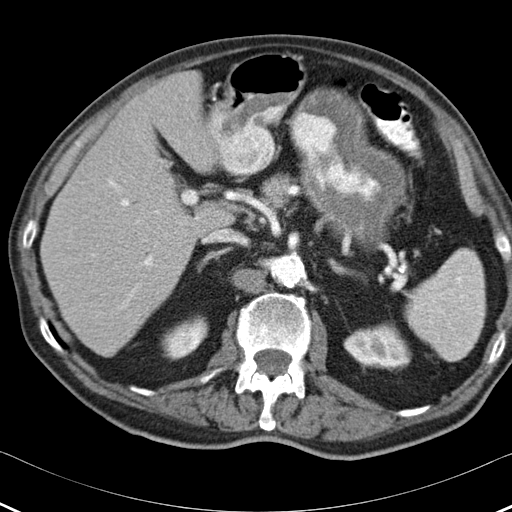
[im 68/89  soft-tissue]
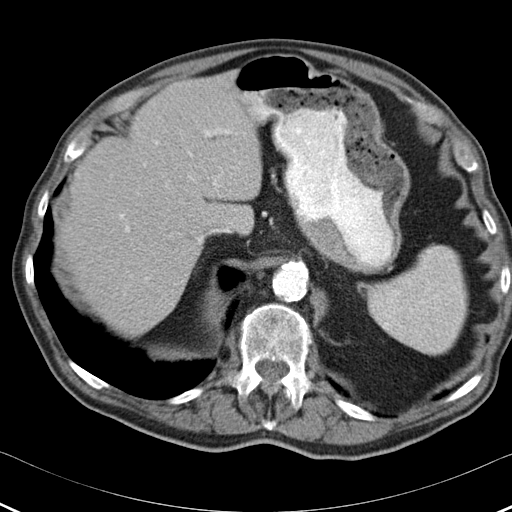
[im 78/89  soft-tissue]
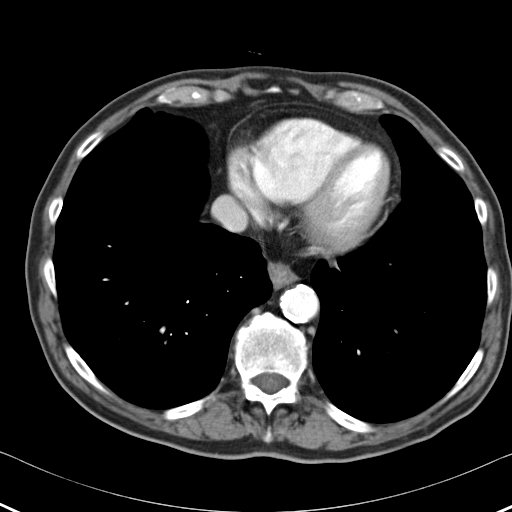
[im 83/89  soft-tissue]
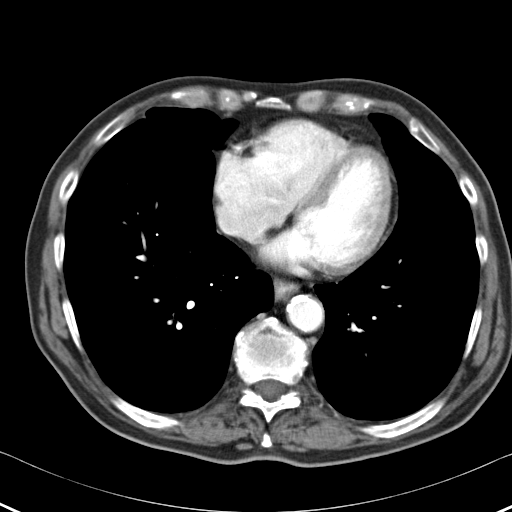

[Series 4: coronal · coronal · 0.70mm/px · 3 of 134 slices shown]
[im 45/134  soft-tissue]
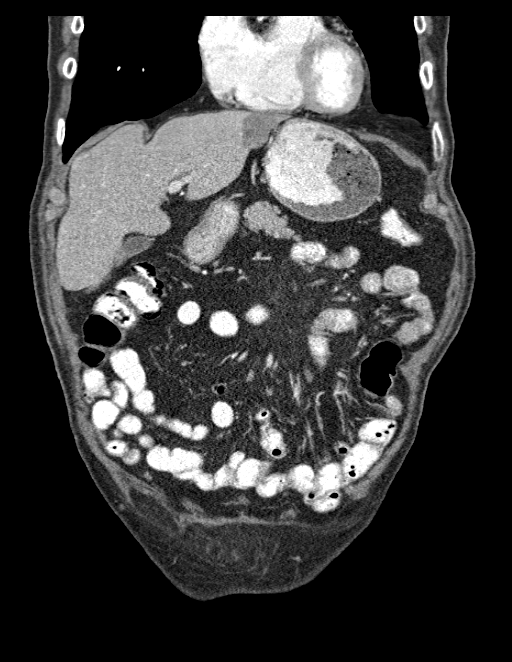
[im 60/134  soft-tissue]
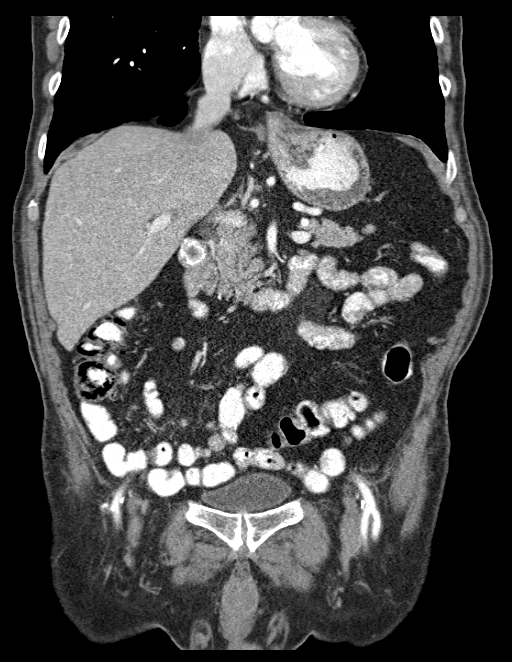
[im 74/134  soft-tissue]
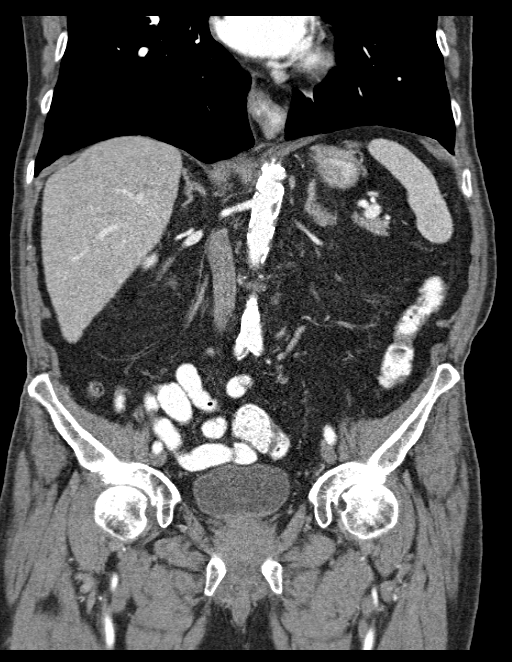

[16 of 46 positions shown; findings below may reference images not displayed]

FINDINGS: Lower chest: Air fill cysts noted anteriorly within the right middle
lobe, stable. No confluent opacities or effusions. Heart is normal
size. Small hiatal hernia.

Hepatobiliary: Stable hemangiomas in the left hepatic dome.
Peripheral puddling of contrast in these hemangiomas. No other focal
or new hepatic abnormality. Gallbladder unremarkable. No biliary
ductal dilatation.

Pancreas: No focal abnormality or ductal dilatation.

Spleen: No focal abnormality.  Normal size.

Adrenals/Urinary Tract: No adrenal abnormality. No focal renal
abnormality. No stones or hydronephrosis. Urinary bladder is
unremarkable.

Stomach/Bowel: Appendix is normal. Stomach, large and small bowel
grossly unremarkable.

Vascular/Lymphatic: Diffuse aortic and iliac calcifications. No
aneurysm. No adenopathy.

Reproductive: Mildly prominent prostate.

Other: Small bilateral inguinal hernias containing fat No free fluid
or free air.

Musculoskeletal: No acute bony abnormality or focal bone lesion.
Degenerative changes in the lower lumbar spine.
IMPRESSION: No acute findings in the abdomen or pelvis.

## 2018-08-22 ENCOUNTER — Telehealth: Payer: Self-pay | Admitting: Family Medicine

## 2018-08-22 NOTE — Telephone Encounter (Signed)
Attempted to return call- line busy

## 2018-08-31 ENCOUNTER — Ambulatory Visit: Payer: Medicare Other | Admitting: Internal Medicine

## 2018-08-31 ENCOUNTER — Encounter: Payer: Self-pay | Admitting: Internal Medicine

## 2018-08-31 VITALS — BP 130/60 | HR 68 | Ht 66.5 in | Wt 131.0 lb

## 2018-08-31 DIAGNOSIS — K219 Gastro-esophageal reflux disease without esophagitis: Secondary | ICD-10-CM

## 2018-08-31 DIAGNOSIS — R1030 Lower abdominal pain, unspecified: Secondary | ICD-10-CM

## 2018-08-31 DIAGNOSIS — K5909 Other constipation: Secondary | ICD-10-CM | POA: Diagnosis not present

## 2018-08-31 DIAGNOSIS — F419 Anxiety disorder, unspecified: Secondary | ICD-10-CM | POA: Diagnosis not present

## 2018-08-31 NOTE — Patient Instructions (Addendum)
Your provider has requested that you go to the basement level for lab work before leaving today. Press "B" on the elevator. The lab is located at the first door on the left as you exit the elevator. _______________________________________________________________________  Albert Allen have been scheduled for a CT scan of the abdomen and pelvis at [facility] ([address]---this is in the same building as Press [facility]).   You are scheduled on 09/08/18 at 4:00 pm. You should arrive 15 minutes prior to your appointment time for registration. Please follow the written instructions below on the day of your exam:  WARNING: IF YOU ARE ALLERGIC TO IODINE/X-RAY DYE, PLEASE NOTIFY RADIOLOGY IMMEDIATELY AT [phone]! YOU WILL BE GIVEN A 13 HOUR PREMEDICATION PREP.  1) Do not eat or drink anything after 12:00 pm (4 hours prior to your test) 2) You have been given 2 bottles of oral contrast to drink. The solution may taste better if refrigerated, but do NOT add ice or any other liquid to this solution. Shake well before drinking.    Drink 1 bottle of contrast @ 2:00 pm (2 hours prior to your exam)  Drink 1 bottle of contrast @ 3:00 pm (1 hour prior to your exam)  You may take any medications as prescribed with a small amount of water, if necessary. If you take any of the following medications: METFORMIN, GLUCOPHAGE, GLUCOVANCE, AVANDAMET, RIOMET, FORTAMET, Belle MET, JANUMET, GLUMETZA or METAGLIP, you MAY be asked to HOLD this medication 48 hours AFTER the exam.  The purpose of you drinking the oral contrast is to aid in the visualization of your intestinal tract. The contrast solution may cause some diarrhea. Depending on your individual set of symptoms, you may also receive an intravenous injection of x-ray contrast/dye. Plan on being at [facility] for 30 minutes or longer, depending on the type of exam you are having performed.  This test typically takes 30-45 minutes to  complete.  If you have any questions regarding your exam or if you need to reschedule, you may call the CT department at [phone] between the hours of 8:00 am and 5:00 pm, Monday-Friday.  ________________________________________________________________________  Continue pantoprazole at current dosage.  Continue Amitiza at current dosage.  Please ask Dr Tammi Klippel to send a copy of his urology note over to [place] after he sees you!  If you are age 52 or older, your body mass index should be between 23-30. Your Body mass index is 20.83 kg/m. If this is out of the aforementioned range listed, please consider follow up with your Primary Care Provider.  If you are age 30 or younger, your body mass index should be between 19-25. Your Body mass index is 20.83 kg/m. If this is out of the aformentioned range listed, please consider follow up with your Primary Care Provider.

## 2018-08-31 NOTE — Progress Notes (Signed)
Subjective:    Patient ID: Albert Allen, male    DOB: 1923-05-26, 84 y.o.   MRN: [id number]  HPI Albert Allen is a 84 year old male with a history of GERD with large hiatal hernia, small Zenker's diverticulum, history of duodenal diverticulum, colonic diverticulosis, constipation with lower abdominal pain, BPH, hypothyroidism and hyperlipidemia who is here for follow-up.  He is here today with his son was last seen on 06/06/2018.  He reports that he has continued to have issues with lower abdominal discomfort which he calls "nausea".  This is present most in the morning but can be felt throughout the day.  He has used Amitiza 8 mcg once daily and with this is having effective bowel movements.  No blood in his stool or melena.  He does have issues with urinary flow and incomplete urination.  When he is able to completely void he feels improvement in his lower abdominal pain.  He also states that his nerves are on edge and he is lost about 4 pounds.  His wife is dying from stage IV bladder cancer.  They have been married for 70 years.  His reflux is been controlled with the pantoprazole 40 mg daily.   Review of Systems As per HPI, otherwise negative  Current Medications, Allergies, Past Medical History, Past Surgical History, Family History and Social History were reviewed in [employer] record.     Objective:   Physical Exam BP 130/60   Pulse 68   Ht 5' 6.5" (1.689 m)   Wt 131 lb (59.4 kg)   BMI 20.83 kg/m  Constitutional: Well-developed and well-nourished. No distress. HEENT: Normocephalic and atraumatic. Conjunctivae are normal.  No scleral icterus. Neck: Neck supple. Trachea midline. Cardiovascular: Normal rate, regular rhythm and intact distal pulses.  Pulmonary/chest: Effort normal and breath sounds normal. No wheezing, rales or rhonchi. Abdominal: Soft, nontender, nondistended. Bowel sounds active throughout.  Extremities: no clubbing, cyanosis, or  edema Neurological: Alert and oriented to person place and time. Skin: Skin is warm and dry. Psychiatric: Normal mood and affect. Behavior is normal.      Assessment & Plan:  84 year old male with a history of GERD with large hiatal hernia, small Zenker's diverticulum, history of duodenal diverticulum, colonic diverticulosis, constipation with lower abdominal pain, BPH, hypothyroidism and hyperlipidemia who is here for follow-up.  1.  Lower abdominal pain/constipation--initially I felt that constipation was the cause of this lower abdominal pain/nausea however he is now having more complete bowel movement with Amitiza which appears to be working effectively for him.  I am suspicious that his lower abdominal complaint could be more related to bladder and prostate issues, query bladder over distention and incomplete voiding.  He has urology consultation, and up with Dr. Tresa Moore later this month.  In the interim I am going to order a CT scan of the abdomen pelvis with contrast.  If CT is unrevealing and Dr. Tresa Moore does not feel this symptom is urologic then I would consider repeating colonoscopy.  In the interim I recommend he continue the Amitiza 8 mcg daily  2.  GERD/hiatal hernia/Zenker's diverticulum --reflux seems well controlled at present on pantoprazole continue pantoprazole 40 mg daily.  3.  Anxiety --exacerbated recently with the stress of his wife's illness and declining health.  I asked that he discuss his anxiety with Dr. Livia Snellen, his PCP.  He does have Xanax which she is using periodically which seems to help.  25 minutes spent with the patient today. Greater than  50% was spent in counseling and coordination of care with the patient

## 2018-09-05 ENCOUNTER — Telehealth: Payer: Self-pay | Admitting: Internal Medicine

## 2018-09-05 DIAGNOSIS — K5730 Diverticulosis of large intestine without perforation or abscess without bleeding: Secondary | ICD-10-CM

## 2018-09-05 DIAGNOSIS — R1030 Lower abdominal pain, unspecified: Secondary | ICD-10-CM

## 2018-09-05 NOTE — Telephone Encounter (Signed)
Pt states he gave Dr. Hilarie Fredrickson some incorrect information last week. He told Dr. Hilarie Fredrickson that his brother had part of his colon removed. States he was incorrect, he had polyps removed. Dr. Hilarie Fredrickson notified.

## 2018-09-05 NOTE — Telephone Encounter (Signed)
Patient calling to discuss labs, had not received a call.  Discussed labwork with patient

## 2018-09-07 ENCOUNTER — Other Ambulatory Visit: Payer: Self-pay

## 2018-09-07 ENCOUNTER — Telehealth: Payer: Self-pay

## 2018-09-07 ENCOUNTER — Other Ambulatory Visit: Payer: Medicare Other

## 2018-09-07 ENCOUNTER — Other Ambulatory Visit: Payer: Self-pay | Admitting: Family Medicine

## 2018-09-07 DIAGNOSIS — R109 Unspecified abdominal pain: Secondary | ICD-10-CM | POA: Diagnosis not present

## 2018-09-07 DIAGNOSIS — E782 Mixed hyperlipidemia: Secondary | ICD-10-CM

## 2018-09-07 DIAGNOSIS — E039 Hypothyroidism, unspecified: Secondary | ICD-10-CM

## 2018-09-07 DIAGNOSIS — I10 Essential (primary) hypertension: Secondary | ICD-10-CM

## 2018-09-07 NOTE — Telephone Encounter (Signed)
Patient came in for labs- do not see in chart why he needs labs- please place order

## 2018-09-07 NOTE — Telephone Encounter (Signed)
Done. Thanks, WS 

## 2018-09-08 ENCOUNTER — Ambulatory Visit (INDEPENDENT_AMBULATORY_CARE_PROVIDER_SITE_OTHER)
Admission: RE | Admit: 2018-09-08 | Discharge: 2018-09-08 | Disposition: A | Discharge status: Home / Self Care | Payer: Medicare Other | Source: Ambulatory Visit | Attending: Internal Medicine | Admitting: Internal Medicine

## 2018-09-08 DIAGNOSIS — R1030 Lower abdominal pain, unspecified: Secondary | ICD-10-CM

## 2018-09-08 DIAGNOSIS — D1809 Hemangioma of other sites: Secondary | ICD-10-CM | POA: Diagnosis not present

## 2018-09-08 LAB — SPECIMEN STATUS

## 2018-09-08 LAB — CREATININE, SERUM
CREATININE: 1.05 mg/dL (ref 0.76–1.27)
GFR calc Af Amer: 70 mL/min/{1.73_m2} (ref >59)
GFR, EST NON AFRICAN AMERICAN: 61 mL/min/{1.73_m2} (ref >59)

## 2018-09-08 LAB — SPECIMEN STATUS REPORT

## 2018-09-08 LAB — BUN: BUN: 12 mg/dL (ref 10–36)

## 2018-09-08 MED ORDER — IOPAMIDOL (ISOVUE-300) INJECTION 61%
100.0000 mL | Freq: Once | INTRAVENOUS | Status: AC | PRN
  Administered 2018-09-08: 100 mL via INTRAVENOUS

## 2018-09-12 NOTE — Telephone Encounter (Signed)
Pt wanted to discuss CT result that have already been reviewed with his son. Went over results with pt and he is aware.

## 2018-09-26 DIAGNOSIS — R3915 Urgency of urination: Secondary | ICD-10-CM | POA: Diagnosis not present

## 2018-09-26 DIAGNOSIS — R110 Nausea: Secondary | ICD-10-CM | POA: Diagnosis not present

## 2018-10-12 ENCOUNTER — Ambulatory Visit (INDEPENDENT_AMBULATORY_CARE_PROVIDER_SITE_OTHER): Payer: Medicare Other

## 2018-10-12 DIAGNOSIS — Z23 Encounter for immunization: Secondary | ICD-10-CM | POA: Diagnosis not present

## 2018-10-15 ENCOUNTER — Other Ambulatory Visit: Payer: Self-pay | Admitting: Family Medicine

## 2018-10-19 ENCOUNTER — Telehealth: Payer: Self-pay | Admitting: *Deleted

## 2018-10-19 NOTE — Telephone Encounter (Signed)
Received a message through a [place] office patient of Dr Hochrein's that this patient would like to change from Dr. [name] to Dr. Percival Spanish d/t transportation issues and advanced age.  Per Dr Percival Spanish - OK with him.  Will forward to CM to verify approval with him.

## 2018-10-19 NOTE — Telephone Encounter (Signed)
OK with me Albert Allen  

## 2018-10-19 NOTE — Telephone Encounter (Signed)
Spoke with Albert Allen who is aware Dr Percival Spanish will see him here in [place] however Albert Allen would like to keep his upcoming appt (11/22) with Dr [name] in [place] and then f/u with Dr Percival Spanish after as instructed.  Advised that will be fine.

## 2018-10-21 ENCOUNTER — Encounter: Payer: Self-pay | Admitting: Cardiovascular Disease

## 2018-10-21 ENCOUNTER — Encounter (INDEPENDENT_AMBULATORY_CARE_PROVIDER_SITE_OTHER): Payer: Self-pay

## 2018-10-21 ENCOUNTER — Ambulatory Visit: Payer: Medicare Other | Admitting: Cardiovascular Disease

## 2018-10-21 VITALS — BP 138/64 | HR 86 | Ht 66.5 in | Wt 134.8 lb

## 2018-10-21 DIAGNOSIS — I2510 Atherosclerotic heart disease of native coronary artery without angina pectoris: Secondary | ICD-10-CM

## 2018-10-21 DIAGNOSIS — I447 Left bundle-branch block, unspecified: Secondary | ICD-10-CM | POA: Diagnosis not present

## 2018-10-21 DIAGNOSIS — I10 Essential (primary) hypertension: Secondary | ICD-10-CM

## 2018-10-21 NOTE — Patient Instructions (Signed)
Medication Instructions:  Your physician recommends that you continue on your current medications as directed. Please refer to the Current Medication list given to you today.  If you need a refill on your cardiac medications before your next appointment, please call your pharmacy.   Lab work: none If you have labs (blood work) drawn today and your tests are completely normal, you will receive your results only by: Marland Kitchen MyChart Message (if you have MyChart) OR . A paper copy in the mail If you have any lab test that is abnormal or we need to change your treatment, we will call you to review the results.  Testing/Procedures: none  Follow-Up:  Your physician wants you to follow-up in: 12 months with Dr. Percival Spanish in [facility] will receive a reminder letter in the mail two months in advance. If you don't receive a letter, please call our office to schedule the follow-up appointment.

## 2018-10-21 NOTE — Progress Notes (Signed)
Chief Complaint  Patient presents with  . Follow-up    CAD    History of Present Illness: 84 yo male with past medical history of CAD, HTN, anemia, asthma/COPD, GERD with hiatal hernia, alcohol abuse in remission and hypothyroidism here today for cardiac follow up. Cardiac cath April 2010 showed a 70-80% heavily calcified stenosis in the moderate caliber mid LAD. The most diseased segment was in the mid LAD just before the takeoff of a large diagonal branch. The Diagonal branch was larger than the LAD although the LAD did course to the apex. The LAD was not felt to be be a good vessel for PCI. There was diffuse mildly obstructive disease in the other vessels. I elected for medical management of his CAD. He has done well over the past 9 years. Vascular screening at [facility] October 2015 with mild bilateral carotid artery disease, normal ABI.  Echo 03/18/16 with normal LV function, mild LVH, trivial AI, mild MR. He has had atypical chest pain for years which has been felt to be related to GERD and anxiety. He continues to have lower abdominal discomfort and is eating being followed in GI by Dr. Hilarie Fredrickson. He has been on Amitiza.   He is here today for follow up. The patient denies any chest pain, dyspnea, palpitations, lower extremity edema, orthopnea, PND, dizziness, near syncope or syncope. He has been under much stress with his wife who is now on Hospice for cancer. He does not think she will make to to Christmas. He has been every active. No exertional dyspnea or chest pain. He is active in the house and yard. He has no trouble with yard work or picking up leaves. His energy level is good.   Primary Care Physician: [name], MD  Past Medical History:  Diagnosis Date  . ABNORMAL CV (STRESS) TEST 03/05/2009  . ABUSE, ALCOHOL, IN REMISSION 05/31/2007  . Adhesive capsulitis of shoulder 12/29/2007  . Alcoholic polyneuropathy ([place]) 03/29/2008  . ANEMIA, B12 DEFICIENCY 03/29/2008  . Anxiety  state, unspecified 11/19/2008  . ASTHMA 05/31/2007  . CAD, NATIVE VESSEL 04/04/2009  . CARPAL TUNNEL SYNDROME, RIGHT 09/05/2007  . Chronic prostatitis 03/29/2008  . Crohn's disease ([place])   . Degenerative disc disease, cervical   . DEGENERATIVE DISC DISEASE, CERVICAL SPINE 06/22/2007  . Diverticulosis   . DYSPHAGIA UNSPECIFIED 09/25/2008  . Esophageal stricture   . External hemorrhoids   . GERD 05/31/2007  . GOUT 05/31/2007  . Hepatomegaly 09/25/2008  . Hiatal hernia   . HIP PAIN 03/20/2010  . History of echocardiogram    Echo 4/17 - mild LVH, EF 50-55%, Gr 1 DD, trivial AI, MAC, mild MR  . HYPERLIPIDEMIA 03/27/2009  . HYPERTENSION 05/31/2007  . HYPOTHYROIDISM 05/31/2007  . LEFT BUNDLE BRANCH BLOCK 02/11/2009  . Liver mass, left lobe   . LOC OSTEOARTHROS NOT SPEC PRIM/SEC LOWER LEG 03/20/2010  . OSTEOARTHRITIS 05/31/2007  . PERSONAL HX COLONIC POLYPS 03/05/1997   adenomatous   . PSA, INCREASED 03/20/2010  . SINUSITIS, CHRONIC NOS 05/31/2007  . Sore throat    CURRENTLY  . SYNCOPE 11/19/2008  . WEIGHT LOSS, RECENT 11/19/2008    Past Surgical History:  Procedure Laterality Date  . carpel tunnel release    . EYE SURGERY    . INGUINAL HERNIA REPAIR  12/18/2015  . INGUINAL HERNIA REPAIR Right 12/18/2015   Procedure: OPEN REPAIR RIGHT INGUINAL HERNIA ;  Surgeon: Greer Pickerel, MD;  Location: [facility];  Service: General;  Laterality: Right;  . INSERTION OF MESH Right 12/18/2015   Procedure: INSERTION OF MESH;  Surgeon: Greer Pickerel, MD;  Location: [facility];  Service: General;  Laterality: Right;  . ORIF ULNAR FRACTURE  11/03/2012   Procedure: OPEN REDUCTION INTERNAL FIXATION (ORIF) ULNAR FRACTURE;  Surgeon: Hessie Dibble, MD;  Location: [facility];  Service: Orthopedics;  Laterality: Right;  ORIF radius and ulnar fractures  . pul. colapse w/chest tube    . TONSILLECTOMY      Current Outpatient Medications  Medication Sig Dispense Refill  . albuterol (PROVENTIL HFA;VENTOLIN HFA) 108 (90 Base) MCG/ACT inhaler  INHALE 2 PUFFS EVERY SIX HOURS AS NEEDED FOR WHEEZING AND SHORT OF BREATH 8.5 g 1  . ALPRAZolam (XANAX) 0.5 MG tablet Take 1 tablet (0.5 mg total) by mouth at bedtime. 30 tablet 5  . levothyroxine (SYNTHROID, LEVOTHROID) 75 MCG tablet Take 1 tablet (75 mcg total) by mouth daily before breakfast. 90 tablet 3  . lubiprostone (AMITIZA) 8 MCG capsule Take 1 capsule (8 mcg total) by mouth 2 (two) times daily with a meal. 180 capsule 1  . pantoprazole (PROTONIX) 40 MG tablet Take 1 tablet (40 mg total) by mouth daily. 90 tablet 3  . pravastatin (PRAVACHOL) 40 MG tablet Take 40 mg by mouth daily.     No current facility-administered medications for this visit.     Allergies  Allergen Reactions  . Amoxicillin Swelling and Other (See Comments)    Pt has swelling with PCN's  . Penicillins Swelling    Lips swelling Has patient had a PCN reaction causing immediate rash, facial/tongue/throat swelling, SOB or lightheadedness with hypotension: No Has patient had a PCN reaction causing severe rash involving mucus membranes or skin necrosis: No Has patient had a PCN reaction that required hospitalization No Has patient had a PCN reaction occurring within the last 10 years: No If all of the above answers are "NO", then may proceed with Cephalosporin use.  Blair Dolphin [Cefaclor] Other (See Comments)    Pt does not remember reaction  . Celebrex [Celecoxib] Other (See Comments)    Pt does not remember reaction  . Levaquin [Levofloxacin In D5w] Other (See Comments)    Pt does not remember reaction    Social History   Socioeconomic History  . Marital status: Married    Spouse name: Not on file  . Number of children: 2  . Years of education: 9  . Highest education level: 9th grade  Occupational History  . Occupation: retired    Comment: Oncologist  Social Needs  . Financial resource strain: Not hard at all  . Food insecurity:    Worry: Never true    Inability: Never true  . Transportation  needs:    Medical: No    Non-medical: No  Tobacco Use  . Smoking status: Former Smoker    Types: Cigarettes    Last attempt to quit: 04/24/1998    Years since quitting: 20.5  . Smokeless tobacco: Never Used  Substance and Sexual Activity  . Alcohol use: Yes    Alcohol/week: 5.0 standard drinks    Types: 5 Shots of liquor per week    Comment: Does not drink weekly but does buy a pint of liqour occasionally and will drink most of the bottle when he does  . Drug use: No  . Sexual activity: Yes  Lifestyle  . Physical activity:    Days per week: 7 days    Minutes per session: 30 min  .  Stress: Very much  Relationships  . Social connections:    Talks on phone: More than three times a week    Gets together: More than three times a week    Attends religious service: More than 4 times per year    Active member of club or organization: Yes    Attends meetings of clubs or organizations: More than 4 times per year    Relationship status: Married  . Intimate partner violence:    Fear of current or ex partner: No    Emotionally abused: No    Physically abused: No    Forced sexual activity: No  Other Topics Concern  . Not on file  Social History Narrative  . Not on file    Family History  Problem Relation Age of Onset  . Cancer Mother        In back but unsure of what type of cancer  . Alcohol abuse Father   . Lung disease Father        abscess  . Diabetes Sister   . Cancer Brother        throat  . Diabetes Brother   . Diabetes Sister   . Colon cancer Neg Hx     Review of Systems:  As stated in the HPI and otherwise negative.   BP 138/64   Pulse 86   Ht 5' 6.5" (1.689 m)   Wt 134 lb 12.8 oz (61.1 kg)   SpO2 98%   BMI 21.43 kg/m   Physical Examination:  General: Well developed, well nourished, NAD  HEENT: OP clear, mucus membranes moist  SKIN: warm, dry. No rashes. Neuro: No focal deficits  Musculoskeletal: Muscle strength 5/5 all ext  Psychiatric: Mood and  affect normal  Neck: No JVD, no carotid bruits, no thyromegaly, no lymphadenopathy.  Lungs:Clear bilaterally, no wheezes, rhonci, crackles Cardiovascular: Regular rate and rhythm. Soft systolic murmur.  Abdomen:Soft. Bowel sounds present. Non-tender.  Extremities: No lower extremity edema. Pulses are 2 + in the bilateral DP/PT.  Echo 03/18/16: Left ventricle: Abnormal septal motion The cavity size was mildly   dilated. Wall thickness was increased in a pattern of mild LVH.   Systolic function was normal. The estimated ejection fraction was   in the range of 50% to 55%. Doppler parameters are consistent   with abnormal left ventricular relaxation (grade 1 diastolic   dysfunction). - Aortic valve: There was trivial regurgitation. - Mitral valve: Calcified annulus. Mildly thickened leaflets .   There was mild regurgitation. - Atrial septum: No defect or patent foramen ovale was identified.  EKG:  EKG is ordered today. The ekg ordered today demonstrates Sinus rhythm, 1st degree AV block. Rate 86 bpm. LBBB-noted on EKG in 2016.   Recent Labs: 01/17/2018: TSH 3.150 07/26/2018: ALT 9; Potassium 4.4; Sodium 137 09/07/2018: BUN 12; Creatinine, Ser 1.05; Hemoglobin WILL FOLLOW; Platelets WILL FOLLOW   Lipid Panel    Component Value Date/Time   CHOL 176 01/17/2018 0922   CHOL 177 05/01/2013 1059   TRIG 183 (H) 01/17/2018 0922   TRIG 196 (H) 05/01/2013 1059   HDL 42 01/17/2018 0922   HDL 45 05/01/2013 1059   CHOLHDL 4.2 01/17/2018 0922   CHOLHDL 4 09/22/2012 1051   VLDL 35.2 09/22/2012 1051   LDLCALC 97 01/17/2018 0922   LDLCALC 93 05/01/2013 1059   LDLDIRECT 67.7 01/12/2012 0823     Wt Readings from Last 3 Encounters:  10/21/18 134 lb 12.8 oz (61.1 kg)  08/31/18 131  lb (59.4 kg)  07/28/18 135 lb (61.2 kg)     Other studies Reviewed: Additional studies/ records that were reviewed today include: . Review of the above records demonstrates:    Assessment and Plan:   1. CAD with  stable angina: He is known to have severe, heavily calcified disease in his mid LAD by cath in 2010. The LAD was not felt to e favorable for PCI. He has done well with medical therapy of his CAD since 2010. He has no change in his chronic chest pain. He is only on ASA and a statin. He has not been on a beta blocker due to bradycardia. He has NTG to use as needed for chest pain.    2. HTN:  BP is well controlled. No changes  3. LBBB: transient. Noted on EKG in 2016 but not present on EKG in 2017 or 2019. He has LBBB today.   Current medicines are reviewed at length with the patient today.  The patient does not have concerns regarding medicines.  The following changes have been made:  no change  Labs/ tests ordered today include:   Orders Placed This Encounter  Procedures  . EKG 12-Lead    Disposition:   FU with in our [place] office in 12 months. He lives near that office.   Signed, Lauree Chandler, MD 10/21/2018 4:14 PM    [facility] [address], [place], [place]  [address] Phone: [phone]; Fax: [phone]

## 2018-11-02 ENCOUNTER — Inpatient Hospital Stay: Payer: Medicare Other | Attending: Oncology | Admitting: Oncology

## 2018-11-02 ENCOUNTER — Inpatient Hospital Stay: Payer: Medicare Other

## 2018-11-02 VITALS — BP 143/66 | HR 76 | Temp 97.6°F | Resp 17 | Ht 66.5 in | Wt 133.7 lb

## 2018-11-02 DIAGNOSIS — D72819 Decreased white blood cell count, unspecified: Secondary | ICD-10-CM | POA: Insufficient documentation

## 2018-11-02 DIAGNOSIS — D518 Other vitamin B12 deficiency anemias: Secondary | ICD-10-CM

## 2018-11-02 DIAGNOSIS — D649 Anemia, unspecified: Secondary | ICD-10-CM | POA: Insufficient documentation

## 2018-11-02 DIAGNOSIS — Z79899 Other long term (current) drug therapy: Secondary | ICD-10-CM | POA: Diagnosis not present

## 2018-11-02 LAB — CBC WITH DIFFERENTIAL (CANCER CENTER ONLY)
ABS IMMATURE GRANULOCYTES: 0.23 10*3/uL — AB (ref 0.00–0.07)
Basophils Absolute: 0 10*3/uL (ref 0.0–0.1)
Basophils Relative: 0 %
Eosinophils Absolute: 0 10*3/uL (ref 0.0–0.5)
Eosinophils Relative: 1 %
HCT: 36.7 % — ABNORMAL LOW (ref 39.0–52.0)
Hemoglobin: 12.3 g/dL — ABNORMAL LOW (ref 13.0–17.0)
Immature Granulocytes: 8 %
Lymphocytes Relative: 40 %
Lymphs Abs: 1.2 10*3/uL (ref 0.7–4.0)
MCH: 34.2 pg — ABNORMAL HIGH (ref 26.0–34.0)
MCHC: 33.5 g/dL (ref 30.0–36.0)
MCV: 101.9 fL — ABNORMAL HIGH (ref 80.0–100.0)
Monocytes Absolute: 0.8 10*3/uL (ref 0.1–1.0)
Monocytes Relative: 27 %
NRBC: 0 % (ref 0.0–0.2)
Neutro Abs: 0.7 10*3/uL — ABNORMAL LOW (ref 1.7–7.7)
Neutrophils Relative %: 24 %
Platelet Count: 157 10*3/uL (ref 150–400)
RBC: 3.6 MIL/uL — AB (ref 4.22–5.81)
RDW: 13.1 % (ref 11.5–15.5)
WBC Count: 2.9 10*3/uL — ABNORMAL LOW (ref 4.0–10.5)

## 2018-11-02 NOTE — Progress Notes (Signed)
Hematology and Oncology Follow Up Visit  Albert Allen [id number] 1923-03-15 84 y.o. 11/02/2018 10:26 AM Livia Snellen, Cletus Gash, MDStacks, Cletus Gash, MD   Principle Diagnosis: 84 year old man with leukocytopenia and mild anemia detected in July 2019.  Similar findings has been detected as far back as 2009.  Differential diagnosis includes benign findings versus early MDS.  Current therapy: Active surveillance.  Interim History: Albert Allen presents today for a follow-up visit.  Since last visit, he had a repeat CBC in August 2019 which showed a white cell count of 3.7, hemoglobin of 12 with normal platelets and normal differential.  He has been feeling down and depressed as of late because of the loss of his wife but no physical complaints.  He denies any excessive fatigue or tiredness.  He denies any bleeding issues or recent hospitalizations.  His quality of life remains unchanged.  He does not report any headaches, blurry vision, syncope or seizures. Does not report any fevers, chills or sweats.  Does not report any cough, wheezing or hemoptysis.  Does not report any chest pain, palpitation, orthopnea or leg edema.  Does not report any nausea, vomiting or abdominal pain.  Does not report any constipation or diarrhea.  Does not report any skeletal complaints.    Does not report frequency, urgency or hematuria.  Does not report any skin rashes or lesions. Does not report any heat or cold intolerance.  Does not report any lymphadenopathy or petechiae.  Does not report any anxiety or depression.  Remaining review of systems is negative.    Medications: I have reviewed the patient's current medications.  Current Outpatient Medications  Medication Sig Dispense Refill  . albuterol (PROVENTIL HFA;VENTOLIN HFA) 108 (90 Base) MCG/ACT inhaler INHALE 2 PUFFS EVERY SIX HOURS AS NEEDED FOR WHEEZING AND SHORT OF BREATH 8.5 g 1  . ALPRAZolam (XANAX) 0.5 MG tablet Take 1 tablet (0.5 mg total) by mouth at bedtime. 30  tablet 5  . levothyroxine (SYNTHROID, LEVOTHROID) 75 MCG tablet Take 1 tablet (75 mcg total) by mouth daily before breakfast. 90 tablet 3  . lubiprostone (AMITIZA) 8 MCG capsule Take 1 capsule (8 mcg total) by mouth 2 (two) times daily with a meal. 180 capsule 1  . pantoprazole (PROTONIX) 40 MG tablet Take 1 tablet (40 mg total) by mouth daily. 90 tablet 3  . pravastatin (PRAVACHOL) 40 MG tablet Take 40 mg by mouth daily.     No current facility-administered medications for this visit.      Allergies:  Allergies  Allergen Reactions  . Amoxicillin Swelling and Other (See Comments)    Pt has swelling with PCN's  . Penicillins Swelling    Lips swelling Has patient had a PCN reaction causing immediate rash, facial/tongue/throat swelling, SOB or lightheadedness with hypotension: No Has patient had a PCN reaction causing severe rash involving mucus membranes or skin necrosis: No Has patient had a PCN reaction that required hospitalization No Has patient had a PCN reaction occurring within the last 10 years: No If all of the above answers are "NO", then may proceed with Cephalosporin use.  Blair Dolphin [Cefaclor] Other (See Comments)    Pt does not remember reaction  . Celebrex [Celecoxib] Other (See Comments)    Pt does not remember reaction  . Levaquin [Levofloxacin In D5w] Other (See Comments)    Pt does not remember reaction    Past Medical History, Surgical history, Social history, and Family History were reviewed and updated.    Physical Exam: Blood pressure Marland Kitchen)  143/66, pulse 76, temperature 97.6 F (36.4 C), temperature source Oral, resp. rate 17, height 5' 6.5" (1.689 m), weight 133 lb 11.2 oz (60.6 kg), SpO2 99 %.   ECOG: 1 General appearance: alert and cooperative appeared without distress. Head: Normocephalic, without obvious abnormality Oropharynx: No oral thrush or ulcers. Eyes: No scleral icterus.  Pupils are equal and round reactive to light. Lymph nodes: Cervical,  supraclavicular, and axillary nodes normal. Heart:regular rate and rhythm, S1, S2 normal, no murmur, click, rub or gallop Lung:chest clear, no wheezing, rales, normal symmetric air entry Abdomin: soft, non-tender, without masses or organomegaly. Neurological: No motor, sensory deficits.  Intact deep tendon reflexes. Skin: No rashes or lesions.  No ecchymosis or petechiae. Musculoskeletal: No joint deformity or effusion. Psychiatric: Mood and affect are appropriate.    Lab Results: Lab Results  Component Value Date   WBC WILL FOLLOW 09/07/2018   HGB WILL FOLLOW 09/07/2018   HCT WILL FOLLOW 09/07/2018   MCV WILL FOLLOW 09/07/2018   PLT WILL FOLLOW 09/07/2018     Chemistry      Component Value Date/Time   NA 137 07/26/2018 1024   K 4.4 07/26/2018 1024   CL 99 07/26/2018 1024   CO2 22 07/26/2018 1024   BUN 12 09/07/2018 1624   CREATININE 1.05 09/07/2018 1624   CREATININE 0.92 05/01/2013 1059      Component Value Date/Time   CALCIUM 8.8 07/26/2018 1024   ALKPHOS 84 07/26/2018 1024   AST 10 07/26/2018 1024   ALT 9 07/26/2018 1024   BILITOT 0.4 07/26/2018 1024        Impression and Plan:  84 year old man with the following:  1.  Leukocytopenia diagnosed in 2019 but has been noted as far back as 2009.  The differential diagnosis was reviewed again which includes benign fluctuating neutropenia versus early MDS.  His repeat white cell count today is not dramatically different and it has been for the last 5 to 10 years.  His differential does show mild neutropenia which is chronic in nature.  I see no evidence of recurrent infections or constitutional symptoms.  I do not recommend any further work-up at this time including bone marrow biopsy or imaging studies at this time.  Periodic monitoring of his CBC would be reasonable if he develops worsening anemia or thrombocytopenia, repeat hematological evaluation will be needed.    2.  Mild anemia: Repeat hemoglobin today  continues to show stable hemoglobin with microcytosis.  I repeated B12 levels to ensure stability.  3.  Follow-up: I am happy to see him in the future as needed.  15  minutes was spent with the patient face-to-face today.  More than 50% of time was dedicated to reviewing his disease status, laboratory data review and differential diagnosis of these findings.     Zola Button, MD 12/4/201910:26 AM

## 2018-11-04 LAB — METHYLMALONIC ACID, SERUM: Methylmalonic Acid, Quantitative: 190 nmol/L (ref 0–378)

## 2018-11-17 ENCOUNTER — Ambulatory Visit (INDEPENDENT_AMBULATORY_CARE_PROVIDER_SITE_OTHER): Payer: Medicare Other | Admitting: Family

## 2018-11-17 ENCOUNTER — Encounter: Payer: Self-pay | Admitting: Family

## 2018-11-17 VITALS — BP 125/67 | HR 80 | Temp 96.6°F | Ht 66.5 in | Wt 133.4 lb

## 2018-11-17 DIAGNOSIS — M26629 Arthralgia of temporomandibular joint, unspecified side: Secondary | ICD-10-CM | POA: Diagnosis not present

## 2018-11-17 NOTE — Progress Notes (Signed)
Subjective:    Patient ID: Albert Allen, male    DOB: September 13, 1923, 84 y.o.   MRN: [id number]  Chief Complaint  Patient presents with  . left ear pain    pain with eating    Otalgia   There is pain in the left ear. This is a new problem. The current episode started in the past 7 days. The problem occurs constantly. The problem has been waxing and waning. There has been no fever. The pain is at a severity of 5/10 (soreness). The pain is moderate. Pertinent negatives include no coughing, ear discharge, headaches, rhinorrhea or sore throat. He has tried ear drops (ear wax removal ) for the symptoms. The treatment provided no relief.      Review of Systems  HENT: Positive for ear pain. Negative for ear discharge, rhinorrhea and sore throat.   Respiratory: Negative for cough.   Neurological: Negative for headaches.  All other systems reviewed and are negative.      Objective:   Physical Exam Vitals signs reviewed.  Constitutional:      General: He is not in acute distress.    Appearance: He is well-developed.  HENT:     Head: Normocephalic.     Right Ear: External ear normal.     Left Ear: External ear normal. Tenderness present.     Ears:     Comments: Pain in left TMJ with opening and closing of mouth  Eyes:     General:        Right eye: No discharge.        Left eye: No discharge.     Pupils: Pupils are equal, round, and reactive to light.  Neck:     Musculoskeletal: Normal range of motion and neck supple.     Thyroid: No thyromegaly.  Cardiovascular:     Rate and Rhythm: Normal rate and regular rhythm.     Heart sounds: Normal heart sounds. No murmur.  Pulmonary:     Effort: Pulmonary effort is normal. No respiratory distress.     Breath sounds: Normal breath sounds. No wheezing.  Abdominal:     General: Bowel sounds are normal. There is no distension.     Palpations: Abdomen is soft.     Tenderness: There is no abdominal tenderness.  Musculoskeletal:        General: No tenderness.  Skin:    General: Skin is warm and dry.     Findings: No erythema or rash.  Neurological:     Mental Status: He is alert and oriented to person, place, and time.     Cranial Nerves: No cranial nerve deficit.     Deep Tendon Reflexes: Reflexes are normal and symmetric.  Psychiatric:        Behavior: Behavior normal.        Thought Content: Thought content normal.        Judgment: Judgment normal.       BP 125/67   Pulse 80   Temp (!) 96.6 F (35.9 C) (Oral)   Ht 5' 6.5" (1.689 m)   Wt 133 lb 6.4 oz (60.5 kg)   BMI 21.21 kg/m      Assessment & Plan:  Albert Allen comes in today with chief complaint of left ear pain (pain with eating)   Diagnosis and orders addressed:  1. TMJ pain dysfunction syndrome Rest jaw, no chewing gum  Soft diet Limit talking Ice Can take motrin as needed with food RTO n  2 weeks if pain is not improved   Evelina Dun, FNP

## 2018-11-17 NOTE — Patient Instructions (Signed)

## 2018-11-28 ENCOUNTER — Ambulatory Visit (INDEPENDENT_AMBULATORY_CARE_PROVIDER_SITE_OTHER): Payer: Medicare Other | Admitting: Family Medicine

## 2018-11-28 ENCOUNTER — Encounter: Payer: Self-pay | Admitting: Family Medicine

## 2018-11-28 VITALS — BP 134/67 | HR 91 | Temp 97.3°F | Ht 66.5 in | Wt 136.0 lb

## 2018-11-28 DIAGNOSIS — J40 Bronchitis, not specified as acute or chronic: Secondary | ICD-10-CM | POA: Diagnosis not present

## 2018-11-28 DIAGNOSIS — J329 Chronic sinusitis, unspecified: Secondary | ICD-10-CM | POA: Diagnosis not present

## 2018-11-28 MED ORDER — MONTELUKAST SODIUM 10 MG PO TABS: 10.0000 mg | ORAL_TABLET | Freq: Every day | ORAL | 3 refills | Status: DC

## 2018-11-28 MED ORDER — AZITHROMYCIN 250 MG PO TABS: ORAL_TABLET | ORAL | 0 refills | Status: DC

## 2018-11-28 MED ORDER — GUAIFENESIN-CODEINE 100-10 MG/5ML PO SYRP: 5.0000 mL | ORAL_SOLUTION | ORAL | 0 refills | Status: DC | PRN

## 2018-11-28 NOTE — Progress Notes (Signed)
Chief Complaint  Patient presents with  . Cough    pt here today c/o cough/congestion    HPI  Patient presents today for Patient presents with upper respiratory congestion. Rhinorrhea that is frequently purulent. There is moderate sore throat. Patient reports coughing frequently as well.  yellow sputum noted. There is no fever, chills or sweats. The patient denies being short of breath. Onset was 3-5 days ago. Gradually worsening. Tried OTCs without improvement.  PMH: Smoking status noted ROS: Per HPI  Objective: BP 134/67   Pulse 91   Temp (!) 97.3 F (36.3 C) (Oral)   Ht 5' 6.5" (1.689 m)   Wt 136 lb (61.7 kg)   BMI 21.62 kg/m  Gen: NAD, alert, cooperative with exam HEENT: NCAT, Nasal passages swollen, red TMS RED CV: RRR, good S1/S2, no murmur Resp: Bronchitis changes with scattered wheezes, non-labored Ext: No edema, warm Neuro: Alert and oriented, No gross deficits  Assessment and plan:  1. Sinobronchitis     Meds ordered this encounter  Medications  . azithromycin (ZITHROMAX Z-PAK) 250 MG tablet    Sig: Take two right away Then one a day for the next 4 days.    Dispense:  6 each    Refill:  0  . montelukast (SINGULAIR) 10 MG tablet    Sig: Take 1 tablet (10 mg total) by mouth at bedtime.    Dispense:  30 tablet    Refill:  3  . guaiFENesin-codeine (CHERATUSSIN AC) 100-10 MG/5ML syrup    Sig: Take 5 mLs by mouth every 4 (four) hours as needed for cough.    Dispense:  180 mL    Refill:  0    No orders of the defined types were placed in this encounter.   Follow up as needed.  [name], MD

## 2019-01-06 ENCOUNTER — Ambulatory Visit (INDEPENDENT_AMBULATORY_CARE_PROVIDER_SITE_OTHER): Payer: Medicare Other | Admitting: Nurse Practitioner

## 2019-01-06 ENCOUNTER — Ambulatory Visit (INDEPENDENT_AMBULATORY_CARE_PROVIDER_SITE_OTHER): Payer: Medicare Other

## 2019-01-06 ENCOUNTER — Encounter: Payer: Self-pay | Admitting: Nurse Practitioner

## 2019-01-06 VITALS — BP 152/77 | HR 89 | Temp 96.8°F | Ht 66.0 in | Wt 135.0 lb

## 2019-01-06 DIAGNOSIS — M25512 Pain in left shoulder: Secondary | ICD-10-CM

## 2019-01-06 DIAGNOSIS — S4992XA Unspecified injury of left shoulder and upper arm, initial encounter: Secondary | ICD-10-CM | POA: Diagnosis not present

## 2019-01-06 NOTE — Patient Instructions (Signed)
Shoulder Pain Many things can cause shoulder pain, including:  An injury.  Moving the shoulder in the same way again and again (overuse).  Joint pain (arthritis). Pain can come from:  Swelling and irritation (inflammation) of any part of the shoulder.  An injury to the shoulder joint.  An injury to: ? Tissues that connect muscle to bone (tendons). ? Tissues that connect bones to each other (ligaments). ? Bones. Follow these instructions at home: Watch for changes in your symptoms. Let your doctor know about them. Follow these instructions to help with your pain. If you have a sling:  Wear the sling as told by your doctor. Remove it only as told by your doctor.  Loosen the sling if your fingers: ? Tingle. ? Become numb. ? Turn cold and blue.  Keep the sling clean.  If the sling is not waterproof: ? Do not let it get wet. ? Take the sling off when you shower or bathe. Managing pain, stiffness, and swelling   If told, put ice on the painful area: ? Put ice in a plastic bag. ? Place a towel between your skin and the bag. ? Leave the ice on for 20 minutes, 2-3 times a day. Stop putting ice on if it does not help with the pain.  Squeeze a soft ball or a foam pad as much as possible. This prevents swelling in the shoulder. It also helps to strengthen the arm. General instructions  Take over-the-counter and prescription medicines only as told by your doctor.  Keep all follow-up visits as told by your doctor. This is important. Contact a doctor if:  Your pain gets worse.  Medicine does not help your pain.  You have new pain in your arm, hand, or fingers. Get help right away if:  Your arm, hand, or fingers: ? Tingle. ? Are numb. ? Are swollen. ? Are painful. ? Turn white or blue. Summary  Shoulder pain can be caused by many things. These include injury, moving the shoulder in the same away again and again, and joint pain.  Watch for changes in your symptoms.  Let your doctor know about them.  This condition may be treated with a sling, ice, and pain medicine.  Contact your doctor if the pain gets worse or you have new pain. Get help right away if your arm, hand, or fingers tingle or get numb, swollen, or painful.  Keep all follow-up visits as told by your doctor. This is important. This information is not intended to replace advice given to you by your health care provider. Make sure you discuss any questions you have with your health care provider. Document Released: 05/04/2008 Document Revised: 05/31/2018 Document Reviewed: 05/31/2018 Elsevier Interactive Patient Education  2019 [employer]  

## 2019-01-06 NOTE — Progress Notes (Signed)
   Subjective:    Patient ID: Albert Allen, male    DOB: December 09, 1922, 85 y.o.   MRN: [id number]   Chief Complaint: Shoulder Pain (Left   [place] last night)   HPI Patient comes in c/o falling last night. He was went down in his basement and tripped over something and fell landing on his left shoulder. Left shoulder is sore to touch but is able to move it around.   Review of Systems  Musculoskeletal: Positive for arthralgias (left shoulder).  All other systems reviewed and are negative.      Objective:   Physical Exam Vitals signs and nursing note reviewed.  Constitutional:      Appearance: Normal appearance. He is normal weight.  Cardiovascular:     Rate and Rhythm: Regular rhythm.     Heart sounds: Normal heart sounds.  Pulmonary:     Breath sounds: Normal breath sounds.  Musculoskeletal:     Comments: Pain on top of left shoulder on palpation FROM of shoulder with "soreness" on abduction and internal rotation Motor strength and sensation distally intact   Skin:    General: Skin is warm and dry.  Neurological:     General: No focal deficit present.     Mental Status: He is oriented to person, place, and time.  Psychiatric:        Mood and Affect: Mood normal.        Behavior: Behavior normal.    BP (!) 152/77   Pulse 89   Temp (!) 96.8 F (36 C) (Oral)   Ht 5\' 6"  (1.676 m)   Wt 135 lb (61.2 kg)   BMI 21.79 kg/m         Assessment & Plan:  [name] in today with chief complaint of Shoulder Pain (Left   Fell last night)   1. Acute pain of left shoulder Rest  Ice bid Tylenol OTC as needed - DG Shoulder Left; Future  Mary-Margaret Hassell Done, FNP

## 2019-01-25 ENCOUNTER — Encounter: Payer: Self-pay | Admitting: Family Medicine

## 2019-01-25 ENCOUNTER — Ambulatory Visit (INDEPENDENT_AMBULATORY_CARE_PROVIDER_SITE_OTHER): Payer: Medicare Other | Admitting: Family Medicine

## 2019-01-25 VITALS — BP 138/72 | HR 82 | Temp 97.0°F | Ht 66.0 in | Wt 134.5 lb

## 2019-01-25 DIAGNOSIS — R350 Frequency of micturition: Secondary | ICD-10-CM

## 2019-01-25 DIAGNOSIS — G8929 Other chronic pain: Secondary | ICD-10-CM

## 2019-01-25 DIAGNOSIS — I10 Essential (primary) hypertension: Secondary | ICD-10-CM

## 2019-01-25 DIAGNOSIS — E039 Hypothyroidism, unspecified: Secondary | ICD-10-CM

## 2019-01-25 DIAGNOSIS — E782 Mixed hyperlipidemia: Secondary | ICD-10-CM

## 2019-01-25 DIAGNOSIS — M545 Low back pain: Secondary | ICD-10-CM | POA: Diagnosis not present

## 2019-01-25 DIAGNOSIS — E559 Vitamin D deficiency, unspecified: Secondary | ICD-10-CM | POA: Diagnosis not present

## 2019-01-25 DIAGNOSIS — K2100 Gastro-esophageal reflux disease with esophagitis, without bleeding: Secondary | ICD-10-CM

## 2019-01-25 DIAGNOSIS — J206 Acute bronchitis due to rhinovirus: Secondary | ICD-10-CM

## 2019-01-25 DIAGNOSIS — N401 Enlarged prostate with lower urinary tract symptoms: Secondary | ICD-10-CM

## 2019-01-25 LAB — URINALYSIS
Bilirubin, UA: NEGATIVE
Glucose, UA: NEGATIVE
Ketones, UA: NEGATIVE
Leukocytes, UA: NEGATIVE
Nitrite, UA: NEGATIVE
Protein, UA: NEGATIVE
Specific Gravity, UA: 1.02 (ref 1.005–1.030)
Urobilinogen, Ur: 0.2 mg/dL (ref 0.2–1.0)
pH, UA: 6.5 (ref 5.0–7.5)

## 2019-01-25 MED ORDER — LEVOTHYROXINE SODIUM 75 MCG PO TABS: 75.0000 ug | ORAL_TABLET | Freq: Every day | ORAL | 3 refills | Status: DC

## 2019-01-25 MED ORDER — MONTELUKAST SODIUM 10 MG PO TABS: 10.0000 mg | ORAL_TABLET | Freq: Every day | ORAL | 1 refills | Status: DC

## 2019-01-25 MED ORDER — PANTOPRAZOLE SODIUM 40 MG PO TBEC: 40.0000 mg | DELAYED_RELEASE_TABLET | Freq: Every day | ORAL | 3 refills | Status: DC

## 2019-01-25 NOTE — Addendum Note (Signed)
Addended by: Marylin Crosby on: 01/25/2019 10:50 AM   Modules accepted: Orders

## 2019-01-25 NOTE — Progress Notes (Signed)
Subjective:  Patient ID: Albert Allen,  male    DOB: November 27, 1923  Age: 85 y.o.    CC: Medical Management of Chronic Issues   HPI Albert Allen presents for  follow-up of hypertension. Patient has no history of headache chest pain or shortness of breath or recent cough. Patient also denies symptoms of TIA such as numbness weakness lateralizing. Patient denies side effects from medication. States taking it regularly.  Patient also  in for follow-up of elevated cholesterol. Doing well without complaints on current medication. Denies side effects  including myalgia and arthralgia and nausea. Also in today for liver function testing. Currently no chest pain, shortness of breath or other cardiovascular related symptoms noted.  Patient presents for follow-up on  thyroid. The patient has a history of hypothyroidism for many years. It has been stable recently. Pt. denies any change in  voice, loss of hair, heat or cold intolerance. Energy level has been adequate to good. Patient denies constipation and diarrhea. No myxedema. Medication is as noted below. Verified that pt is taking it daily on an empty stomach. Well tolerated.  With regard to mental status patient is alert and tells me today he remembers that I told him a while back that my daughter who shares the same date of birth with him, May 30 and she is getting married soon.  He denies any memory loss.  Says he remembers a lot of things he which she could forget.  He points out having used alcohol in the past.  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), ANEMIA, B12 DEFICIENCY (03/29/2008), Anxiety state, unspecified (11/19/2008), ASTHMA (05/31/2007), CAD, NATIVE VESSEL (04/04/2009), CARPAL TUNNEL SYNDROME, RIGHT (09/05/2007), Chronic prostatitis (03/29/2008), Crohn's disease  ([place]), Degenerative disc disease, cervical, DEGENERATIVE DISC DISEASE, CERVICAL SPINE (06/22/2007), Diverticulosis, DYSPHAGIA UNSPECIFIED (09/25/2008), Esophageal stricture, External hemorrhoids, GERD (05/31/2007), GOUT (05/31/2007), Hepatomegaly (09/25/2008), Hiatal hernia, HIP PAIN (03/20/2010), History of echocardiogram, HYPERLIPIDEMIA (03/27/2009), HYPERTENSION (05/31/2007), HYPOTHYROIDISM (05/31/2007), LEFT BUNDLE BRANCH BLOCK (02/11/2009), Liver mass, left lobe, LOC OSTEOARTHROS NOT SPEC PRIM/SEC LOWER LEG (03/20/2010), OSTEOARTHRITIS (05/31/2007), PERSONAL HX COLONIC POLYPS (03/05/1997), PSA, INCREASED (03/20/2010), SINUSITIS, CHRONIC NOS (05/31/2007), Sore throat, SYNCOPE (11/19/2008), and WEIGHT LOSS, RECENT (11/19/2008).   He has a past surgical history that includes pul. colapse w/chest tube; Tonsillectomy; carpel tunnel release; ORIF ulnar fracture (11/03/2012); Eye surgery; Inguinal hernia repair (12/18/2015); Inguinal hernia repair (Right, 12/18/2015); and Insertion of mesh (Right, 12/18/2015).   His family history includes Alcohol abuse in his father; Cancer in his brother and mother; Diabetes in his brother, sister, and sister; Lung disease in his father.He reports that he quit smoking about 20 years ago. His smoking use included cigarettes. He has never used smokeless tobacco. He reports current alcohol use of about 5.0 standard drinks of alcohol per week. He reports that he does not use drugs.  Current Outpatient Medications on File Prior to Visit  Medication Sig Dispense Refill  . albuterol (PROVENTIL HFA;VENTOLIN HFA) 108 (90 Base) MCG/ACT inhaler INHALE 2 PUFFS EVERY SIX HOURS AS NEEDED FOR WHEEZING AND SHORT OF BREATH 8.5 g 1  . ALPRAZolam (XANAX) 0.5 MG tablet Take 1 tablet (0.5 mg total) by mouth at bedtime. 30 tablet 5  . aspirin EC 81 MG tablet Take 81 mg by mouth daily.    Marland Kitchen levothyroxine (SYNTHROID, LEVOTHROID) 75 MCG tablet Take 1 tablet (75 mcg total) by mouth daily before  breakfast. 90 tablet 3    . lubiprostone (AMITIZA) 8 MCG capsule Take 1 capsule (8 mcg total) by mouth 2 (two) times daily with a meal. 180 capsule 1  . montelukast (SINGULAIR) 10 MG tablet Take 1 tablet (10 mg total) by mouth at bedtime. 30 tablet 3  . naproxen sodium (ALEVE) 220 MG tablet Take 220 mg by mouth.    . olmesartan (BENICAR) 40 MG tablet Take 40 mg by mouth daily.    . pantoprazole (PROTONIX) 40 MG tablet Take 1 tablet (40 mg total) by mouth daily. 90 tablet 3  . pravastatin (PRAVACHOL) 40 MG tablet Take 40 mg by mouth daily.    . Cyanocobalamin (VITAMIN B-12 PO) Take by mouth.     No current facility-administered medications on file prior to visit.     ROS Review of Systems  Constitutional: Negative.   HENT: Negative.   Eyes: Negative for visual disturbance.  Respiratory: Negative for cough and shortness of breath.   Cardiovascular: Negative for chest pain and leg swelling.  Gastrointestinal: Negative for abdominal pain, diarrhea, nausea and vomiting.  Genitourinary: Negative for difficulty urinating.  Musculoskeletal: Positive for back pain and neck pain. Negative for arthralgias and myalgias.  Skin: Negative for rash.  Neurological: Negative for headaches.  Psychiatric/Behavioral: Negative for sleep disturbance.    Objective:  BP 138/72   Pulse 82   Temp (!) 97 F (36.1 C) (Oral)   Ht 5\' 6"  (1.676 m)   Wt 134 lb 8 oz (61 kg)   BMI 21.71 kg/m   BP Readings from Last 3 Encounters:  01/25/19 138/72  01/06/19 (!) 152/77  11/28/18 134/67    Wt Readings from Last 3 Encounters:  01/25/19 134 lb 8 oz (61 kg)  01/06/19 135 lb (61.2 kg)  11/28/18 136 lb (61.7 kg)     Physical Exam Constitutional:      General: He is not in acute distress.    Appearance: He is well-developed.  HENT:     Head: Normocephalic and atraumatic.     Right Ear: External ear normal.     Left Ear: External ear normal.     Nose: Nose normal.  Eyes:     Conjunctiva/sclera: Conjunctivae normal.      Pupils: Pupils are equal, round, and reactive to light.  Neck:     Musculoskeletal: Normal range of motion and neck supple.  Cardiovascular:     Rate and Rhythm: Normal rate and regular rhythm.     Heart sounds: Normal heart sounds. No murmur.  Pulmonary:     Effort: Pulmonary effort is normal. No respiratory distress.     Breath sounds: Normal breath sounds. No wheezing or rales.  Abdominal:     Palpations: Abdomen is soft.     Tenderness: There is no abdominal tenderness.  Musculoskeletal: Normal range of motion.  Skin:    General: Skin is warm and dry.  Neurological:     Mental Status: He is alert and oriented to person, place, and time.     Deep Tendon Reflexes: Reflexes are normal and symmetric.  Psychiatric:        Behavior: Behavior normal.        Thought Content: Thought content normal.        Judgment: Judgment normal.     Diabetic Foot Exam - Simple   No data filed        Assessment & Plan:   Albert Allen was seen today for medical management of chronic issues.  Diagnoses and all orders for this visit:  Chronic midline low back pain without sciatica  Essential hypertension  Hypothyroidism, unspecified type  Mixed hyperlipidemia   I am having Albert Allen. Albert Allen maintain his levothyroxine, pantoprazole, lubiprostone, ALPRAZolam, albuterol, pravastatin, aspirin EC, Cyanocobalamin (VITAMIN B-12 PO), naproxen sodium, montelukast, and olmesartan.  No orders of the defined types were placed in this encounter.    Follow-up: Return in about 6 months (around 07/26/2019).  [name], M.D.

## 2019-01-26 LAB — CMP14+EGFR
ALT: 8 IU/L (ref 0–44)
AST: 11 IU/L (ref 0–40)
Albumin/Globulin Ratio: 2.1 (ref 1.2–2.2)
Albumin: 4.2 g/dL (ref 3.5–4.6)
Alkaline Phosphatase: 70 IU/L (ref 39–117)
BUN/Creatinine Ratio: 14 (ref 10–24)
BUN: 13 mg/dL (ref 10–36)
Bilirubin Total: 0.5 mg/dL (ref 0.0–1.2)
CO2: 22 mmol/L (ref 20–29)
Calcium: 8.9 mg/dL (ref 8.6–10.2)
Chloride: 103 mmol/L (ref 96–106)
Creatinine, Ser: 0.92 mg/dL (ref 0.76–1.27)
GFR calc Af Amer: 83 mL/min/{1.73_m2} (ref >59)
GFR, EST NON AFRICAN AMERICAN: 71 mL/min/{1.73_m2} (ref >59)
Globulin, Total: 2 g/dL (ref 1.5–4.5)
Glucose: 85 mg/dL (ref 65–99)
Potassium: 4.8 mmol/L (ref 3.5–5.2)
SODIUM: 139 mmol/L (ref 134–144)
Total Protein: 6.2 g/dL (ref 6.0–8.5)

## 2019-01-26 LAB — LIPID PANEL
CHOL/HDL RATIO: 2.9 ratio (ref 0.0–5.0)
Cholesterol, Total: 146 mg/dL (ref 100–199)
HDL: 51 mg/dL (ref >39)
LDL Calculated: 68 mg/dL (ref 0–99)
TRIGLYCERIDES: 133 mg/dL (ref 0–149)
VLDL Cholesterol Cal: 27 mg/dL (ref 5–40)

## 2019-01-26 LAB — CBC WITH DIFFERENTIAL/PLATELET
Basophils Absolute: 0 10*3/uL (ref 0.0–0.2)
Basos: 1 %
EOS (ABSOLUTE): 0 10*3/uL (ref 0.0–0.4)
Eos: 0 %
HEMATOCRIT: 33.9 % — AB (ref 37.5–51.0)
Hemoglobin: 11.7 g/dL — ABNORMAL LOW (ref 13.0–17.7)
LYMPHS ABS: 1.2 10*3/uL (ref 0.7–3.1)
Lymphs: 48 %
MCH: 34.9 pg — ABNORMAL HIGH (ref 26.6–33.0)
MCHC: 34.5 g/dL (ref 31.5–35.7)
MCV: 101 fL — ABNORMAL HIGH (ref 79–97)
Monocytes Absolute: 0.5 10*3/uL (ref 0.1–0.9)
Monocytes: 19 %
Neutrophils Absolute: 0.8 10*3/uL — ABNORMAL LOW (ref 1.4–7.0)
Neutrophils: 32 %
Platelets: 136 10*3/uL — ABNORMAL LOW (ref 150–450)
RBC: 3.35 x10E6/uL — AB (ref 4.14–5.80)
RDW: 12.9 % (ref 11.6–15.4)
WBC: 2.6 10*3/uL — ABNORMAL LOW (ref 3.4–10.8)

## 2019-01-26 LAB — PSA, TOTAL AND FREE
PSA, Free Pct: 18.2 %
PSA, Free: 0.31 ng/mL
Prostate Specific Ag, Serum: 1.7 ng/mL (ref 0.0–4.0)

## 2019-01-26 LAB — VITAMIN D 25 HYDROXY (VIT D DEFICIENCY, FRACTURES): Vit D, 25-Hydroxy: 35.6 ng/mL (ref 30.0–100.0)

## 2019-02-02 ENCOUNTER — Other Ambulatory Visit: Payer: Self-pay | Admitting: Family Medicine

## 2019-02-02 DIAGNOSIS — F411 Generalized anxiety disorder: Secondary | ICD-10-CM

## 2019-02-14 ENCOUNTER — Other Ambulatory Visit: Payer: Self-pay | Admitting: Family Medicine

## 2019-02-14 DIAGNOSIS — F411 Generalized anxiety disorder: Secondary | ICD-10-CM

## 2019-02-14 MED ORDER — ALPRAZOLAM 0.5 MG PO TABS: 0.5000 mg | ORAL_TABLET | Freq: Every day | ORAL | 5 refills | Status: DC

## 2019-03-27 ENCOUNTER — Ambulatory Visit: Payer: Medicare Other | Admitting: Internal Medicine

## 2019-03-29 ENCOUNTER — Other Ambulatory Visit: Payer: Self-pay | Admitting: Family Medicine

## 2019-04-03 ENCOUNTER — Other Ambulatory Visit: Payer: Self-pay | Admitting: Family Medicine

## 2019-05-01 ENCOUNTER — Telehealth: Payer: Self-pay | Admitting: Internal Medicine

## 2019-05-01 NOTE — Telephone Encounter (Signed)
Patient son call would like to know what we should recommend he is having sever abd pain. Patient feels like the pain can take him out. Normally it goes away but not this time. The son would like a call back.

## 2019-05-01 NOTE — Telephone Encounter (Signed)
there has been question in the past whether this pain relates to constipation/bowel pain versus bladder related spasm/pain. He also sees Dr. Bess Harvest for urology I did a CT scan of his abdomen pelvis in October 2019 for this symptom which was rather unrevealing from a GI perspective  I would recommend he consider a bowel purge to significantly cleanse/cleanout the colon and see if pain improves  MiraLAX prep 255 g in 64 ounces of Gatorade; can start with 32 ounces over 3 to 4 hours which should give him considerable diarrhea and flushing.  I would like to know if this improves his pain.  They should call back and let me know and then we can work on an office visit depending on response

## 2019-05-01 NOTE — Telephone Encounter (Signed)
Pts son calling stating that his father is having issues again with nausea and his abdominal pain below his belly button. Son states pt has been dealing with the pain for a while but he has not had any "good days" for over the past week. He is staying in the bed and not acting like himself. He states pt is having normal bowel movements. Son does not know what the next step is with his father, his dad states he is afraid this pain is going to be his demise. First available OV is 6/30. Please advise.

## 2019-05-01 NOTE — Telephone Encounter (Signed)
Spoke with pts son and he is aware. Purge instructions given to son and he knows to call back with an update and if not better will work on [place].

## 2019-05-02 ENCOUNTER — Other Ambulatory Visit: Payer: Self-pay | Admitting: Cardiovascular Disease

## 2019-06-01 ENCOUNTER — Other Ambulatory Visit: Payer: Self-pay | Admitting: Family Medicine

## 2019-06-12 ENCOUNTER — Other Ambulatory Visit: Payer: Self-pay | Admitting: Internal Medicine

## 2019-07-03 DIAGNOSIS — H26493 Other secondary cataract, bilateral: Secondary | ICD-10-CM | POA: Diagnosis not present

## 2019-07-03 DIAGNOSIS — H353132 Nonexudative age-related macular degeneration, bilateral, intermediate dry stage: Secondary | ICD-10-CM | POA: Diagnosis not present

## 2019-07-25 ENCOUNTER — Other Ambulatory Visit: Payer: Self-pay | Admitting: Family Medicine

## 2019-07-26 ENCOUNTER — Encounter: Payer: Self-pay | Admitting: Family Medicine

## 2019-07-26 ENCOUNTER — Ambulatory Visit (INDEPENDENT_AMBULATORY_CARE_PROVIDER_SITE_OTHER): Payer: Medicare Other | Admitting: Family Medicine

## 2019-07-26 ENCOUNTER — Other Ambulatory Visit: Payer: Self-pay

## 2019-07-26 VITALS — BP 153/78 | HR 75 | Temp 99.1°F | Ht 66.0 in | Wt 133.0 lb

## 2019-07-26 DIAGNOSIS — E782 Mixed hyperlipidemia: Secondary | ICD-10-CM

## 2019-07-26 DIAGNOSIS — E039 Hypothyroidism, unspecified: Secondary | ICD-10-CM

## 2019-07-26 DIAGNOSIS — M1991 Primary osteoarthritis, unspecified site: Secondary | ICD-10-CM

## 2019-07-26 DIAGNOSIS — I10 Essential (primary) hypertension: Secondary | ICD-10-CM

## 2019-07-26 DIAGNOSIS — D518 Other vitamin B12 deficiency anemias: Secondary | ICD-10-CM | POA: Diagnosis not present

## 2019-07-26 DIAGNOSIS — Z7689 Persons encountering health services in other specified circumstances: Secondary | ICD-10-CM | POA: Diagnosis not present

## 2019-07-26 LAB — URINALYSIS
Bilirubin, UA: NEGATIVE
Glucose, UA: NEGATIVE
Ketones, UA: NEGATIVE
Leukocytes,UA: NEGATIVE
Nitrite, UA: NEGATIVE
Protein,UA: NEGATIVE
Specific Gravity, UA: 1.01 (ref 1.005–1.030)
Urobilinogen, Ur: 0.2 mg/dL (ref 0.2–1.0)
pH, UA: 6 (ref 5.0–7.5)

## 2019-07-26 NOTE — Progress Notes (Signed)
Subjective:  Patient ID: Albert Allen, male    DOB: 05/14/1923  Age: 85 y.o. MRN: [id number]  CC: Medical Management of Chronic Issues   HPI Albert Allen presents for  follow-up of hypertension. Patient has no history of headache chest pain or shortness of breath or recent cough. Patient also denies symptoms of TIA such as focal numbness or weakness. Patient denies side effects from medication. States taking it regularly.  Patie patient in for follow-up of elevated cholesterol. Doing well without complaints on current medication. Denies side effects of statin including myalgia and arthralgia and nausea. Also in today for liver function testing. Currently no chest pain, shortness of breath or other cardiovascular related symptoms noted.   t presents for follow-up on  thyroid. The patient has a history of hypothyroidism for many years. It has been stable recently. Pt. denies any change in  voice, loss of hair, heat or cold intolerance. Energy level has been adequate to good. Patient denies constipation and diarrhea. No myxedema. Medication is as noted below. Verified that pt is taking it daily on an empty stomach. Well tolerated.   Grieving still for the loss of his wife several months ago. Not sleeping well - 2-3 hours a night  History Albert Allen has a past medical history of ABNORMAL CV (STRESS) TEST (03/05/2009), ABUSE, ALCOHOL, IN REMISSION (05/31/2007), Adhesive capsulitis of shoulder (4/97/0263), Alcoholic polyneuropathy ([place]) (03/29/2008), ANEMIA, B12 DEFICIENCY (03/29/2008), Anxiety state, unspecified (11/19/2008), ASTHMA (05/31/2007), CAD, NATIVE VESSEL (04/04/2009), CARPAL TUNNEL SYNDROME, RIGHT (09/05/2007), Chronic prostatitis (03/29/2008), Crohn's disease ([place]), Degenerative disc disease, cervical, DEGENERATIVE DISC DISEASE, CERVICAL SPINE (06/22/2007), Diverticulosis, DYSPHAGIA UNSPECIFIED (09/25/2008), Esophageal stricture, External hemorrhoids, GERD (05/31/2007), GOUT (05/31/2007), Hepatomegaly  (09/25/2008), Hiatal hernia, HIP PAIN (03/20/2010), History of echocardiogram, HYPERLIPIDEMIA (03/27/2009), HYPERTENSION (05/31/2007), HYPOTHYROIDISM (05/31/2007), LEFT BUNDLE BRANCH BLOCK (02/11/2009), Liver mass, left lobe, LOC OSTEOARTHROS NOT SPEC PRIM/SEC LOWER LEG (03/20/2010), OSTEOARTHRITIS (05/31/2007), PERSONAL HX COLONIC POLYPS (03/05/1997), PSA, INCREASED (03/20/2010), SINUSITIS, CHRONIC NOS (05/31/2007), Sore throat, SYNCOPE (11/19/2008), and WEIGHT LOSS, RECENT (11/19/2008).   He has a past surgical history that includes pul. colapse w/chest tube; Tonsillectomy; carpel tunnel release; ORIF ulnar fracture (11/03/2012); Eye surgery; Inguinal hernia repair (12/18/2015); Inguinal hernia repair (Right, 12/18/2015); and Insertion of mesh (Right, 12/18/2015).   His family history includes Alcohol abuse in his father; Cancer in his brother and mother; Diabetes in his brother, sister, and sister; Lung disease in his father.He reports that he quit smoking about 21 years ago. His smoking use included cigarettes. He has never used smokeless tobacco. He reports current alcohol use of about 5.0 standard drinks of alcohol per week. He reports that he does not use drugs.  Current Outpatient Medications on File Prior to Visit  Medication Sig Dispense Refill   albuterol (VENTOLIN HFA) 108 (90 Base) MCG/ACT inhaler INHALE 2 PUFFS EVERY SIX HOURS AS NEEDED FOR WHEEZING AND SHORT OF BREATH 18 g 1   ALPRAZolam (XANAX) 0.5 MG tablet Take 1 tablet (0.5 mg total) by mouth at bedtime. 30 tablet 5   AMITIZA 8 MCG capsule TAKE  (1)  CAPSULE  TWICE DAILY WITH MEALS. 60 capsule 1   aspirin EC 81 MG tablet Take 81 mg by mouth daily.     Cyanocobalamin (VITAMIN B-12 PO) Take by mouth.     levothyroxine (SYNTHROID, LEVOTHROID) 75 MCG tablet Take 1 tablet (75 mcg total) by mouth daily before breakfast. 90 tablet 3   montelukast (SINGULAIR) 10 MG tablet Take 1 tablet (10 mg total) by mouth at  bedtime. 90 tablet 1   Multiple  Vitamins-Minerals (PRESERVISION AREDS) CAPS Take by mouth.     naproxen sodium (ALEVE) 220 MG tablet Take 220 mg by mouth.     olmesartan (BENICAR) 40 MG tablet Take 1 tablet (40 mg total) by mouth daily. 90 tablet 0   pantoprazole (PROTONIX) 40 MG tablet Take 1 tablet (40 mg total) by mouth daily. 90 tablet 3   pravastatin (PRAVACHOL) 40 MG tablet TAKE 1 TABLET EVERY EVENING 90 tablet 2   No current facility-administered medications on file prior to visit.     ROS Review of Systems  Constitutional: Negative.   HENT: Negative.   Eyes: Negative for visual disturbance.  Respiratory: Negative for cough and shortness of breath.   Cardiovascular: Negative for chest pain and leg swelling.  Gastrointestinal: Negative for abdominal pain, diarrhea, nausea and vomiting.  Genitourinary: Negative for difficulty urinating.  Musculoskeletal: Positive for back pain. Negative for arthralgias and myalgias.  Skin: Negative for rash.  Neurological: Positive for dizziness (described as poor balance) and numbness (in feet. They burn all the time. ). Negative for headaches.  Psychiatric/Behavioral: Negative for sleep disturbance.    Objective:  BP (!) 153/78    Pulse 75    Temp 99.1 F (37.3 C) (Oral)    Ht '5\' 6"'$  (1.676 m)    Wt 133 lb (60.3 kg)    BMI 21.47 kg/m   BP Readings from Last 3 Encounters:  07/26/19 (!) 153/78  01/25/19 138/72  01/06/19 (!) 152/77    Wt Readings from Last 3 Encounters:  07/26/19 133 lb (60.3 kg)  01/25/19 134 lb 8 oz (61 kg)  01/06/19 135 lb (61.2 kg)     Physical Exam Constitutional:      General: He is not in acute distress.    Appearance: He is well-developed.  HENT:     Head: Normocephalic and atraumatic.     Right Ear: External ear normal.     Left Ear: External ear normal.     Nose: Nose normal.  Eyes:     Conjunctiva/sclera: Conjunctivae normal.     Pupils: Pupils are equal, round, and reactive to light.  Neck:     Musculoskeletal: Normal range  of motion and neck supple.  Cardiovascular:     Rate and Rhythm: Normal rate and regular rhythm.     Heart sounds: Normal heart sounds. No murmur.  Pulmonary:     Effort: Pulmonary effort is normal. No respiratory distress.     Breath sounds: Normal breath sounds. No wheezing or rales.  Abdominal:     Palpations: Abdomen is soft.     Tenderness: There is no abdominal tenderness.  Musculoskeletal: Normal range of motion.  Skin:    General: Skin is warm and dry.  Neurological:     Mental Status: He is alert and oriented to person, place, and time.     Deep Tendon Reflexes: Reflexes are normal and symmetric.  Psychiatric:        Behavior: Behavior normal.        Thought Content: Thought content normal.        Judgment: Judgment normal.       Assessment & Plan:   Leotis was seen today for medical management of chronic issues.  Diagnoses and all orders for this visit:  Essential hypertension -     CBC with Differential/Platelet -     CMP14+EGFR -     Urinalysis  Mixed hyperlipidemia  Hypothyroidism, unspecified type -  TSH -     T4, Free  Primary osteoarthritis, unspecified site   Allergies as of 07/26/2019      Reactions   Amoxicillin Swelling, Other (See Comments)   Pt has swelling with PCN's   Penicillins Swelling   Lips swelling Has patient had a PCN reaction causing immediate rash, facial/tongue/throat swelling, SOB or lightheadedness with hypotension: No Has patient had a PCN reaction causing severe rash involving mucus membranes or skin necrosis: No Has patient had a PCN reaction that required hospitalization No Has patient had a PCN reaction occurring within the last 10 years: No If all of the above answers are "NO", then may proceed with Cephalosporin use.   Ceclor [cefaclor] Other (See Comments)   Pt does not remember reaction   Celebrex [celecoxib] Other (See Comments)   Pt does not remember reaction   Levaquin [levofloxacin In D5w] Other (See  Comments)   Pt does not remember reaction      Medication List       Accurate as of July 26, 2019 11:03 PM. If you have any questions, ask your nurse or doctor.        albuterol 108 (90 Base) MCG/ACT inhaler Commonly known as: VENTOLIN HFA INHALE 2 PUFFS EVERY SIX HOURS AS NEEDED FOR WHEEZING AND SHORT OF BREATH   ALPRAZolam 0.5 MG tablet Commonly known as: XANAX Take 1 tablet (0.5 mg total) by mouth at bedtime.   Amitiza 8 MCG capsule Generic drug: lubiprostone TAKE  (1)  CAPSULE  TWICE DAILY WITH MEALS.   aspirin EC 81 MG tablet Take 81 mg by mouth daily.   levothyroxine 75 MCG tablet Commonly known as: SYNTHROID Take 1 tablet (75 mcg total) by mouth daily before breakfast.   montelukast 10 MG tablet Commonly known as: SINGULAIR Take 1 tablet (10 mg total) by mouth at bedtime.   naproxen sodium 220 MG tablet Commonly known as: ALEVE Take 220 mg by mouth.   olmesartan 40 MG tablet Commonly known as: BENICAR Take 1 tablet (40 mg total) by mouth daily.   pantoprazole 40 MG tablet Commonly known as: PROTONIX Take 1 tablet (40 mg total) by mouth daily.   pravastatin 40 MG tablet Commonly known as: PRAVACHOL TAKE 1 TABLET EVERY EVENING   PreserVision AREDS Caps Take by mouth.   VITAMIN B-12 PO Take by mouth.       No orders of the defined types were placed in this encounter.   After discussion of various medications for the concerns above, pt. Decides he doesn't want any more medicines than he is already taking.  Follow-up: Return in about 6 months (around 01/26/2020).  [name], M.D.

## 2019-07-27 LAB — CMP14+EGFR
ALT: 7 IU/L (ref 0–44)
AST: 12 IU/L (ref 0–40)
Albumin/Globulin Ratio: 1.9 (ref 1.2–2.2)
Albumin: 4 g/dL (ref 3.5–4.6)
Alkaline Phosphatase: 77 IU/L (ref 39–117)
BUN/Creatinine Ratio: 10 (ref 10–24)
BUN: 8 mg/dL — ABNORMAL LOW (ref 10–36)
Bilirubin Total: 0.5 mg/dL (ref 0.0–1.2)
CO2: 22 mmol/L (ref 20–29)
Calcium: 8.7 mg/dL (ref 8.6–10.2)
Chloride: 100 mmol/L (ref 96–106)
Creatinine, Ser: 0.84 mg/dL (ref 0.76–1.27)
GFR calc Af Amer: 87 mL/min/{1.73_m2} (ref >59)
GFR calc non Af Amer: 75 mL/min/{1.73_m2} (ref >59)
Globulin, Total: 2.1 g/dL (ref 1.5–4.5)
Glucose: 101 mg/dL — ABNORMAL HIGH (ref 65–99)
Potassium: 4.1 mmol/L (ref 3.5–5.2)
Sodium: 137 mmol/L (ref 134–144)
Total Protein: 6.1 g/dL (ref 6.0–8.5)

## 2019-07-27 LAB — CBC WITH DIFFERENTIAL/PLATELET
Basophils Absolute: 0 10*3/uL (ref 0.0–0.2)
Basos: 0 %
EOS (ABSOLUTE): 0.1 10*3/uL (ref 0.0–0.4)
Eos: 2 %
Hematocrit: 32.4 % — ABNORMAL LOW (ref 37.5–51.0)
Hemoglobin: 11 g/dL — ABNORMAL LOW (ref 13.0–17.7)
Lymphocytes Absolute: 1.5 10*3/uL (ref 0.7–3.1)
Lymphs: 41 %
MCH: 34.9 pg — ABNORMAL HIGH (ref 26.6–33.0)
MCHC: 34 g/dL (ref 31.5–35.7)
MCV: 103 fL — ABNORMAL HIGH (ref 79–97)
Monocytes Absolute: 0.6 10*3/uL (ref 0.1–0.9)
Monocytes: 16 %
Neutrophils Absolute: 1.4 10*3/uL (ref 1.4–7.0)
Neutrophils: 40 %
Platelets: 152 10*3/uL (ref 150–450)
RBC: 3.15 x10E6/uL — ABNORMAL LOW (ref 4.14–5.80)
RDW: 13.2 % (ref 11.6–15.4)
WBC: 3.6 10*3/uL (ref 3.4–10.8)

## 2019-07-27 LAB — TSH: TSH: 1.76 u[IU]/mL (ref 0.450–4.500)

## 2019-07-27 LAB — T4, FREE: Free T4: 1.53 ng/dL (ref 0.82–1.77)

## 2019-07-27 LAB — IMMATURE CELLS: Metamyelocytes: 1 % — ABNORMAL HIGH (ref 0–0)

## 2019-07-30 LAB — SPECIMEN STATUS REPORT

## 2019-07-30 LAB — VITAMIN B12: Vitamin B-12: 700 pg/mL (ref 232–1245)

## 2019-07-30 LAB — FOLATE: Folate: 11.4 ng/mL (ref >3.0)

## 2019-08-01 ENCOUNTER — Telehealth: Payer: Self-pay | Admitting: Family Medicine

## 2019-08-01 NOTE — Telephone Encounter (Signed)
Patient calling to check and see what his glucose level was.  Informed of result.

## 2019-08-02 ENCOUNTER — Other Ambulatory Visit: Payer: Self-pay | Admitting: Family Medicine

## 2019-08-08 ENCOUNTER — Other Ambulatory Visit: Payer: Self-pay | Admitting: Family Medicine

## 2019-08-21 ENCOUNTER — Other Ambulatory Visit: Payer: Self-pay | Admitting: Family Medicine

## 2019-08-21 DIAGNOSIS — F411 Generalized anxiety disorder: Secondary | ICD-10-CM

## 2019-08-25 ENCOUNTER — Other Ambulatory Visit: Payer: Self-pay

## 2019-08-25 NOTE — Patient Outreach (Signed)
[facility]) Care Management  08/25/2019  Albert Allen 23-Oct-1923 [id number]  Medication Adherence call to Mr. Albert Allen [facility] Identifiers Verify spoke with patient he is past due on Pravastatin 40 mg he explain he takes 1 tablet daily patient has medication at this time ,he explain he has been feeling dizzy patient has schedule an appointment with the doctor to see  him regarding the dizzyness. Albert Allen is showing past due under [place].   [facility] Direct Dial [phone]  Fax [phone] [email or web address]

## 2019-09-18 DIAGNOSIS — N138 Other obstructive and reflux uropathy: Secondary | ICD-10-CM | POA: Diagnosis not present

## 2019-09-19 ENCOUNTER — Other Ambulatory Visit: Payer: Self-pay

## 2019-09-20 ENCOUNTER — Ambulatory Visit (INDEPENDENT_AMBULATORY_CARE_PROVIDER_SITE_OTHER): Payer: Medicare Other

## 2019-09-20 DIAGNOSIS — Z23 Encounter for immunization: Secondary | ICD-10-CM

## 2019-09-22 ENCOUNTER — Encounter: Payer: Self-pay | Admitting: *Deleted

## 2019-10-02 ENCOUNTER — Encounter: Payer: Self-pay | Admitting: Internal Medicine

## 2019-10-02 ENCOUNTER — Ambulatory Visit: Payer: Medicare Other | Admitting: Internal Medicine

## 2019-10-02 VITALS — BP 130/70 | HR 76 | Temp 98.1°F | Ht 66.0 in | Wt 130.0 lb

## 2019-10-02 DIAGNOSIS — R1030 Lower abdominal pain, unspecified: Secondary | ICD-10-CM | POA: Diagnosis not present

## 2019-10-02 DIAGNOSIS — K219 Gastro-esophageal reflux disease without esophagitis: Secondary | ICD-10-CM | POA: Diagnosis not present

## 2019-10-02 DIAGNOSIS — K5909 Other constipation: Secondary | ICD-10-CM

## 2019-10-02 DIAGNOSIS — K449 Diaphragmatic hernia without obstruction or gangrene: Secondary | ICD-10-CM

## 2019-10-02 MED ORDER — LUBIPROSTONE 8 MCG PO CAPS: ORAL_CAPSULE | ORAL | 2 refills | Status: DC

## 2019-10-02 NOTE — Progress Notes (Signed)
Subjective:    Patient ID: Albert Allen, male    DOB: 12-08-1922, 85 y.o.   MRN: [id number]  HPI Albert Allen is a 85 year old male with a history of GERD with large hiatal hernia, small Zenker's diverticulum, history of duodenal diverticulum, colonic diverticulosis, constipation with lower abdominal pain, BPH, hypothyroidism and hyperlipidemia is here for follow-up.  He is here alone today and I last saw him on 08/31/2018.  He reports that he has been doing better from a constipation perspective.  He is using Amitiza 8 mcg in the morning.  He is found if he takes this before eating he will have a bowel movement in the mid morning.  Stools can be somewhat loose but not diarrhea.  He will occasionally use prune juice in the evening to also help with regular bowel movement.  He reports he has been eating "pretty good" and he has lost 1 pound in the last year.  He still feels early morning around 5 AM what he calls "nausea".  This is present in the suprapubic abdomen and denies that this is the pain.  Describes it as a "nausea feeling".  Seems to get better using a heating pad.  Also gets better when he empties his bladder.  Reports 4-6 awakenings per night to urinate and at times feels incomplete bladder emptying.  He has tried Rapaflo which he reports does not seem to help.  He did see Dr. Tresa Moore recently.  He also continues pantoprazole and denies heartburn.  Denies dysphagia and odynophagia.  He does admit to drinking more alcohol than maybe he should.  He finds that alcohol helps him sleep better otherwise he does not sleep well.  He drinks a half a pint of Meguel Helming maybe 4 days/week and this is more than he was previously drinking.  He still reports issues with his "nerves" which worsened last year when he lost his wife of 52 years.  He reports he misses her every day.  His last colonoscopy was performed in July 2012 by Dr. Sharlett Iles.  This showed extensive diverticulosis and  hemorrhoids but no polyps.  He was 85 years old at that time.  Review of Systems As per HPI, otherwise negative  Current Medications, Allergies, Past Medical History, Past Surgical History, Family History and Social History were reviewed in [employer] record.     Objective:   Physical Exam BP 130/70 (BP Location: Left Arm, Patient Position: Sitting)   Pulse 76   Temp 98.1 F (36.7 C)   Ht 5\' 6"  (1.676 m)   Wt 130 lb (59 kg)   SpO2 96%   BMI 20.98 kg/m  Gen: awake, alert, NAD HEENT: anicteric, op clear CV: RRR, 2/6 sem Pulm: CTA b/l Abd: soft, NT/ND, +BS throughout Ext: no c/c/e Neuro: nonfocal  CBC    Component Value Date/Time   WBC 3.6 07/26/2019 1116   WBC 2.9 (L) 11/02/2018 1014   WBC 3.1 (L) 06/06/2018 1442   RBC 3.15 (L) 07/26/2019 1116   RBC 3.60 (L) 11/02/2018 1014   HGB 11.0 (L) 07/26/2019 1116   HCT 32.4 (L) 07/26/2019 1116   PLT 152 07/26/2019 1116   MCV 103 (H) 07/26/2019 1116   MCH 34.9 (H) 07/26/2019 1116   MCH 34.2 (H) 11/02/2018 1014   MCHC 34.0 07/26/2019 1116   MCHC 33.5 11/02/2018 1014   RDW 13.2 07/26/2019 1116   LYMPHSABS 1.5 07/26/2019 1116   MONOABS 0.8 11/02/2018 1014   EOSABS 0.1 07/26/2019  1116   BASOSABS 0.0 07/26/2019 1116   CMP     Component Value Date/Time   NA 137 07/26/2019 1116   K 4.1 07/26/2019 1116   CL 100 07/26/2019 1116   CO2 22 07/26/2019 1116   GLUCOSE 101 (H) 07/26/2019 1116   GLUCOSE 150 (H) 07/01/2016 1042   BUN 8 (L) 07/26/2019 1116   CREATININE 0.84 07/26/2019 1116   CREATININE 0.92 05/01/2013 1059   CALCIUM 8.7 07/26/2019 1116   PROT 6.1 07/26/2019 1116   ALBUMIN 4.0 07/26/2019 1116   AST 12 07/26/2019 1116   ALT 7 07/26/2019 1116   ALKPHOS 77 07/26/2019 1116   BILITOT 0.5 07/26/2019 1116   GFRNONAA 75 07/26/2019 1116   GFRNONAA 74 05/01/2013 1059   GFRAA 87 07/26/2019 1116   GFRAA 86 05/01/2013 1059      Assessment & Plan:  85 year old male with a history of GERD with  large hiatal hernia, small Zenker's diverticulum, history of duodenal diverticulum, colonic diverticulosis, constipation with lower abdominal pain, BPH, hypothyroidism and hyperlipidemia is here for follow-up.   1.  Suprapubic discomfort/"nausea" --he continues to report "nausea" symptom in the morning before getting up around 5 AM.  This is present despite regular bowel movements with Amitiza.  He reports trouble emptying bladder, particularly at night.  Once he gets up and is able to urinate this symptom resolves quickly.  Query if this could be bladder or prostate related though he has not had any response to Rapaflo. --I will communicate with Dr. Tresa Moore, his urologist to see if the addition of finasteride and oxybutynin may help the symptom --At this point I do not think colonoscopy would yield pathology or change management regarding this symptom  2.  Constipation --previously an issue now having daily bowel movement with Amitiza 8 mcg once daily --Continue Amitiza 8 mcg once daily --Okay for prune juice in the evening if he desires  3.  GERD/hiatal hernia --reflux under good control without symptomatic heartburn or epigastric pain. --Continue pantoprazole 40 mg daily  I am happy to see him on an as-needed basis and annually for Amitiza refill  25 minutes spent with the patient today. Greater than 50% was spent in counseling and coordination of care with the patient

## 2019-10-02 NOTE — Patient Instructions (Signed)
Continue Amitiza once daily.  Continue Pantoprazole.  Can use prune juice daily for constipation.  If you are age 83 or older, your body mass index should be between 23-30. Your Body mass index is 20.98 kg/m. If this is out of the aforementioned range listed, please consider follow up with your Primary Care Provider.  If you are age 66 or younger, your body mass index should be between 19-25. Your Body mass index is 20.98 kg/m. If this is out of the aformentioned range listed, please consider follow up with your Primary Care Provider.

## 2019-10-18 ENCOUNTER — Other Ambulatory Visit: Payer: Self-pay

## 2019-10-18 ENCOUNTER — Encounter: Payer: Self-pay | Admitting: Cardiovascular Disease

## 2019-10-18 ENCOUNTER — Ambulatory Visit: Payer: Medicare Other | Admitting: Cardiovascular Disease

## 2019-10-18 VITALS — BP 148/80 | HR 73 | Ht 67.0 in | Wt 130.8 lb

## 2019-10-18 DIAGNOSIS — I2510 Atherosclerotic heart disease of native coronary artery without angina pectoris: Secondary | ICD-10-CM | POA: Diagnosis not present

## 2019-10-18 DIAGNOSIS — I10 Essential (primary) hypertension: Secondary | ICD-10-CM | POA: Diagnosis not present

## 2019-10-18 NOTE — Progress Notes (Signed)
Chief Complaint  Patient presents with  . Follow-up    CAD    History of Present Illness: 85 yo male with past medical history of CAD, HTN, anemia, asthma/COPD, GERD with hiatal hernia, alcohol abuse in remission and hypothyroidism here today for cardiac follow up. Cardiac cath April 2010 showed a 70-80% heavily calcified stenosis in the moderate caliber mid LAD. The most diseased segment was in the mid LAD just before the takeoff of a large diagonal branch. The Diagonal branch was larger than the LAD although the LAD did course to the apex. The LAD was not felt to be be a good vessel for PCI. There was diffuse mildly obstructive disease in the other vessels. We elected for medical management of his CAD and he has done well since then. Vascular screening at [facility] October 2015 with mild bilateral carotid artery disease, normal ABI.  Echo 03/18/16 with normal LV function, mild LVH, trivial AI, mild MR. He has had atypical chest pain for years which has been felt to be related to GERD and anxiety. He continues to have lower abdominal discomfort and is eating being followed in GI by Dr. Hilarie Fredrickson. He has been on Amitiza. He has had vascular screening In October 2020 which showed mild carotid artery disease, normal ABI, no AAA.   He is here today for follow up. The patient denies any chest pain, dyspnea, palpitations, lower extremity edema, orthopnea, PND, near syncope or syncope. Chronic dizziness without syncope. He remains very active and has been doing his yardwork.    Primary Care Physician: [name], MD  Past Medical History:  Diagnosis Date  . ABNORMAL CV (STRESS) TEST 03/05/2009  . ABUSE, ALCOHOL, IN REMISSION 05/31/2007  . Adhesive capsulitis of shoulder 12/29/2007  . Alcoholic polyneuropathy ([place]) 03/29/2008  . ANEMIA, B12 DEFICIENCY 03/29/2008  . Anxiety state, unspecified 11/19/2008  . ASTHMA 05/31/2007  . CAD, NATIVE VESSEL 04/04/2009  . CARPAL TUNNEL SYNDROME, RIGHT 09/05/2007   . Chronic prostatitis 03/29/2008  . Crohn's disease ([place])   . Degenerative disc disease, cervical   . DEGENERATIVE DISC DISEASE, CERVICAL SPINE 06/22/2007  . Diverticulosis   . DYSPHAGIA UNSPECIFIED 09/25/2008  . Esophageal stricture   . External hemorrhoids   . GERD 05/31/2007  . GOUT 05/31/2007  . Hepatomegaly 09/25/2008  . Hiatal hernia   . HIP PAIN 03/20/2010  . History of echocardiogram    Echo 4/17 - mild LVH, EF 50-55%, Gr 1 DD, trivial AI, MAC, mild MR  . HYPERLIPIDEMIA 03/27/2009  . HYPERTENSION 05/31/2007  . HYPOTHYROIDISM 05/31/2007  . LEFT BUNDLE BRANCH BLOCK 02/11/2009  . Liver hemangioma   . Liver mass, left lobe   . LOC OSTEOARTHROS NOT SPEC PRIM/SEC LOWER LEG 03/20/2010  . OSTEOARTHRITIS 05/31/2007  . PERSONAL HX COLONIC POLYPS 03/05/1997   adenomatous   . PSA, INCREASED 03/20/2010  . SINUSITIS, CHRONIC NOS 05/31/2007  . Sore throat    CURRENTLY  . SYNCOPE 11/19/2008  . WEIGHT LOSS, RECENT 11/19/2008    Past Surgical History:  Procedure Laterality Date  . carpel tunnel release    . EYE SURGERY    . INGUINAL HERNIA REPAIR  12/18/2015  . INGUINAL HERNIA REPAIR Right 12/18/2015   Procedure: OPEN REPAIR RIGHT INGUINAL HERNIA ;  Surgeon: Greer Pickerel, MD;  Location: [place];  Service: General;  Laterality: Right;  . INSERTION OF MESH Right 12/18/2015   Procedure: INSERTION OF MESH;  Surgeon: Greer Pickerel, MD;  Location: [place];  Service:  General;  Laterality: Right;  . ORIF ULNAR FRACTURE  11/03/2012   Procedure: OPEN REDUCTION INTERNAL FIXATION (ORIF) ULNAR FRACTURE;  Surgeon: Hessie Dibble, MD;  Location: [place];  Service: Orthopedics;  Laterality: Right;  ORIF radius and ulnar fractures  . pul. colapse w/chest tube    . TONSILLECTOMY      Current Outpatient Medications  Medication Sig Dispense Refill  . albuterol (VENTOLIN HFA) 108 (90 Base) MCG/ACT inhaler INHALE 2 PUFFS EVERY SIX HOURS AS NEEDED FOR WHEEZING AND SHORT OF BREATH 18 g 1  . ALPRAZolam (XANAX) 0.5 MG tablet TAKE  ONE TABLET AT BEDTIME 30 tablet 5  . aspirin EC 81 MG tablet Take 81 mg by mouth daily.    . Cyanocobalamin (VITAMIN B-12 PO) Take by mouth.    . diclofenac (VOLTAREN) 75 MG EC tablet TAKE 1 TABLET TWICE DAILY AS NEEDED FOR JOINT OR MUSCLE PAIN 60 tablet 0  . levothyroxine (SYNTHROID, LEVOTHROID) 75 MCG tablet Take 1 tablet (75 mcg total) by mouth daily before breakfast. 90 tablet 3  . lubiprostone (AMITIZA) 8 MCG capsule TAKE  (1)  CAPSULE  TWICE DAILY WITH MEALS. 60 capsule 2  . montelukast (SINGULAIR) 10 MG tablet Take 1 tablet (10 mg total) by mouth at bedtime. 90 tablet 1  . Multiple Vitamins-Minerals (PRESERVISION AREDS) CAPS Take by mouth.    . naproxen sodium (ALEVE) 220 MG tablet Take 220 mg by mouth.    . olmesartan (BENICAR) 40 MG tablet TAKE 1 TABLET EVERY DAY 90 tablet 1  . pantoprazole (PROTONIX) 40 MG tablet Take 1 tablet (40 mg total) by mouth daily. 90 tablet 3  . pravastatin (PRAVACHOL) 40 MG tablet TAKE 1 TABLET EVERY EVENING 90 tablet 2  . silodosin (RAPAFLO) 4 MG CAPS capsule Take 4 mg by mouth daily.     No current facility-administered medications for this visit.     Allergies  Allergen Reactions  . Amoxicillin Swelling and Other (See Comments)    Pt has swelling with PCN's  . Penicillins Swelling    Lips swelling Has patient had a PCN reaction causing immediate rash, facial/tongue/throat swelling, SOB or lightheadedness with hypotension: No Has patient had a PCN reaction causing severe rash involving mucus membranes or skin necrosis: No Has patient had a PCN reaction that required hospitalization No Has patient had a PCN reaction occurring within the last 10 years: No If all of the above answers are "NO", then may proceed with Cephalosporin use.  Blair Dolphin [Cefaclor] Other (See Comments)    Pt does not remember reaction  . Celebrex [Celecoxib] Other (See Comments)    Pt does not remember reaction  . Levaquin [Levofloxacin In D5w] Other (See Comments)    Pt does  not remember reaction    Social History   Socioeconomic History  . Marital status: Married    Spouse name: Not on file  . Number of children: 2  . Years of education: 9  . Highest education level: 9th grade  Occupational History  . Occupation: retired    Comment: Oncologist  Social Needs  . Financial resource strain: Not hard at all  . Food insecurity    Worry: Never true    Inability: Never true  . Transportation needs    Medical: No    Non-medical: No  Tobacco Use  . Smoking status: Former Smoker    Types: Cigarettes    Quit date: 04/24/1998    Years since quitting: 21.4  . Smokeless tobacco:  Never Used  Substance and Sexual Activity  . Alcohol use: Yes    Alcohol/week: 5.0 standard drinks    Types: 5 Shots of liquor per week    Comment: Does not drink weekly but does buy a pint of liqour occasionally and will drink most of the bottle when he does  . Drug use: No  . Sexual activity: Yes  Lifestyle  . Physical activity    Days per week: 7 days    Minutes per session: 30 min  . Stress: Very much  Relationships  . Social connections    Talks on phone: More than three times a week    Gets together: More than three times a week    Attends religious service: More than 4 times per year    Active member of club or organization: Yes    Attends meetings of clubs or organizations: More than 4 times per year    Relationship status: Married  . Intimate partner violence    Fear of current or ex partner: No    Emotionally abused: No    Physically abused: No    Forced sexual activity: No  Other Topics Concern  . Not on file  Social History Narrative  . Not on file    Family History  Problem Relation Age of Onset  . Cancer Mother        In back but unsure of what type of cancer  . Alcohol abuse Father   . Lung disease Father        abscess  . Diabetes Sister   . Cancer Brother        throat  . Diabetes Brother   . Diabetes Sister   . Colon cancer Neg Hx      Review of Systems:  As stated in the HPI and otherwise negative.   BP (!) 148/80   Pulse 73   Ht _0  (1.702 m)   Wt 130 lb 12.8 oz (59.3 kg)   SpO2 98%   BMI 20.49 kg/m   Physical Examination:  General: Well developed, well nourished, NAD  HEENT: OP clear, mucus membranes moist  SKIN: warm, dry. No rashes. Neuro: No focal deficits  Musculoskeletal: Muscle strength 5/5 all ext  Psychiatric: Mood and affect normal  Neck: No JVD, no carotid bruits, no thyromegaly, no lymphadenopathy.  Lungs:Clear bilaterally, no wheezes, rhonci, crackles Cardiovascular: Regular rate and rhythm. No murmurs, gallops or rubs. Abdomen:Soft. Bowel sounds present. Non-tender.  Extremities: No lower extremity edema. Pulses are 2 + in the bilateral DP/PT.  Echo 03/18/16: Left ventricle: Abnormal septal motion The cavity size was mildly   dilated. Wall thickness was increased in a pattern of mild LVH.   Systolic function was normal. The estimated ejection fraction was   in the range of 50% to 55%. Doppler parameters are consistent   with abnormal left ventricular relaxation (grade 1 diastolic   dysfunction). - Aortic valve: There was trivial regurgitation. - Mitral valve: Calcified annulus. Mildly thickened leaflets .   There was mild regurgitation. - Atrial septum: No defect or patent foramen ovale was identified.  EKG:  EKG is ordered today. The ekg ordered today demonstrates Sinus, rate73 bpm. 1st degree AV block.   Recent Labs: 07/26/2019: ALT 7; BUN 8; Creatinine, Ser 0.84; Hemoglobin 11.0; Platelets 152; Potassium 4.1; Sodium 137; TSH 1.760   Lipid Panel    Component Value Date/Time   CHOL 146 01/25/2019 1141   CHOL 177 05/01/2013 1059   TRIG  133 01/25/2019 1141   TRIG 196 (H) 05/01/2013 1059   HDL 51 01/25/2019 1141   HDL 45 05/01/2013 1059   CHOLHDL 2.9 01/25/2019 1141   CHOLHDL 4 09/22/2012 1051   VLDL 35.2 09/22/2012 1051   LDLCALC 68 01/25/2019 1141   LDLCALC 93 05/01/2013  1059   LDLDIRECT 67.7 01/12/2012 0823     Wt Readings from Last 3 Encounters:  10/18/19 130 lb 12.8 oz (59.3 kg)  10/02/19 130 lb (59 kg)  07/26/19 133 lb (60.3 kg)     Other studies Reviewed: Additional studies/ records that were reviewed today include: . Review of the above records demonstrates:    Assessment and Plan:   1. CAD with stable angina: He is known to have severe, heavily calcified disease in his mid LAD by cath in 2010. The LAD was not felt to be favorable for PCI. He has done well with medical therapy of his CAD since 2010. No change in chronic mild chest pain. He remains active. He has not been on a beta blocker due to bradycardia. He has NTG to use as needed for chest pain.  Continue ASA and statin. LDL at goal in February 2020.   2. HTN:  BP is controlled.   3. LBBB: transient. Noted on EKG in 2016 and 2019 but not present on EKG in 2017 or 2018.   Current medicines are reviewed at length with the patient today.  The patient does not have concerns regarding medicines.  The following changes have been made:  no change  Labs/ tests ordered today include:   No orders of the defined types were placed in this encounter.   Disposition:   Follow up with me in 12 months.    Signed, Lauree Chandler, MD 10/18/2019 10:18 AM    [address], [place], [place]  [address] Phone: [phone]; Fax: [phone]

## 2019-10-18 NOTE — Patient Instructions (Signed)

## 2019-11-09 ENCOUNTER — Ambulatory Visit (INDEPENDENT_AMBULATORY_CARE_PROVIDER_SITE_OTHER): Payer: Medicare Other | Admitting: Family Medicine

## 2019-11-09 ENCOUNTER — Other Ambulatory Visit: Payer: Self-pay | Admitting: Family Medicine

## 2019-11-09 ENCOUNTER — Other Ambulatory Visit: Payer: Self-pay

## 2019-11-09 ENCOUNTER — Encounter: Payer: Self-pay | Admitting: Family Medicine

## 2019-11-09 DIAGNOSIS — J329 Chronic sinusitis, unspecified: Secondary | ICD-10-CM | POA: Diagnosis not present

## 2019-11-09 DIAGNOSIS — J40 Bronchitis, not specified as acute or chronic: Secondary | ICD-10-CM

## 2019-11-09 MED ORDER — AZITHROMYCIN 250 MG PO TABS: ORAL_TABLET | ORAL | 0 refills | Status: DC

## 2019-11-09 MED ORDER — PREDNISONE 10 MG PO TABS: ORAL_TABLET | ORAL | 0 refills | Status: DC

## 2019-11-09 NOTE — Progress Notes (Signed)
Subjective:    Patient ID: Albert Allen, male    DOB: February 22, 1923, 85 y.o.   MRN: [id number]   HPI: Albert Allen is a 85 y.o. male presenting for a lot of discomfort in his back. Burning and stinging. Heat helps. Temp is 97.7 today. . Lungs are clear. CoughingDenies dyspnea. Sneezing X 5 today. Has a runny nose as well.    Depression screen [facility] 2/9 07/26/2019 01/25/2019 01/06/2019 11/28/2018 11/17/2018  Decreased Interest 0 0 0 0 0  Down, Depressed, Hopeless 0 0 0 1 1  PHQ - 2 Score 0 0 0 1 1  Some recent data might be hidden     Relevant past medical, surgical, family and social history reviewed and updated as indicated.  Interim medical history since our last visit reviewed. Allergies and medications reviewed and updated.  ROS:  Review of Systems  Constitutional: Negative for activity change, appetite change, chills and fever.  HENT: Positive for congestion, postnasal drip, rhinorrhea and sinus pressure. Negative for ear discharge, ear pain, hearing loss, nosebleeds, sneezing and trouble swallowing.   Respiratory: Negative for chest tightness and shortness of breath.   Cardiovascular: Negative for chest pain and palpitations.  Skin: Negative for rash.     Social History   Tobacco Use  Smoking Status Former Smoker  . Types: Cigarettes  . Quit date: 04/24/1998  . Years since quitting: 21.5  Smokeless Tobacco Never Used       Objective:     Wt Readings from Last 3 Encounters:  10/18/19 130 lb 12.8 oz (59.3 kg)  10/02/19 130 lb (59 kg)  07/26/19 133 lb (60.3 kg)     Exam deferred. Pt. Harboring due to COVID 19. Phone visit performed.   Assessment & Plan:   1. Sinobronchitis     Meds ordered this encounter  Medications  . azithromycin (ZITHROMAX Z-PAK) 250 MG tablet    Sig: Take two right away Then one a day for the next 4 days.    Dispense:  6 each    Refill:  0  . predniSONE (DELTASONE) 10 MG tablet    Sig: Take 5 daily for 2 days followed by  4,3,2 and 1 for 2 days each.    Dispense:  30 tablet    Refill:  0    No orders of the defined types were placed in this encounter.     Diagnoses and all orders for this visit:  Sinobronchitis  Other orders -     azithromycin (ZITHROMAX Z-PAK) 250 MG tablet; Take two right away Then one a day for the next 4 days. -     predniSONE (DELTASONE) 10 MG tablet; Take 5 daily for 2 days followed by 4,3,2 and 1 for 2 days each.    Virtual Visit via telephone Note  I discussed the limitations, risks, security and privacy concerns of performing an evaluation and management service by telephone and the availability of in person appointments. The patient was identified with two identifiers. Pt.expressed understanding and agreed to proceed. Pt. Is at home. Dr. Livia Snellen is in his office.  Follow Up Instructions:   I discussed the assessment and treatment plan with the patient. The patient was provided an opportunity to ask questions and all were answered. The patient agreed with the plan and demonstrated an understanding of the instructions.   The patient was advised to call back or seek an in-person evaluation if the symptoms worsen or if the condition fails to improve as  anticipated.   Total minutes including chart review and phone contact time: 12   Follow up plan: Return if symptoms worsen or fail to improve.  [name], MD [place]

## 2019-11-22 NOTE — Telephone Encounter (Signed)
Error

## 2019-12-20 ENCOUNTER — Other Ambulatory Visit: Payer: Self-pay | Admitting: Family Medicine

## 2019-12-22 ENCOUNTER — Telehealth: Payer: Self-pay | Admitting: Family Medicine

## 2019-12-22 ENCOUNTER — Other Ambulatory Visit: Payer: Self-pay | Admitting: Family Medicine

## 2019-12-22 ENCOUNTER — Encounter: Payer: Self-pay | Admitting: Family Medicine

## 2019-12-22 ENCOUNTER — Ambulatory Visit (INDEPENDENT_AMBULATORY_CARE_PROVIDER_SITE_OTHER): Payer: Medicare Other | Admitting: Family Medicine

## 2019-12-22 VITALS — BP 139/69 | HR 86 | Temp 98.9°F | Ht 67.0 in | Wt 132.4 lb

## 2019-12-22 DIAGNOSIS — F331 Major depressive disorder, recurrent, moderate: Secondary | ICD-10-CM

## 2019-12-22 DIAGNOSIS — J449 Chronic obstructive pulmonary disease, unspecified: Secondary | ICD-10-CM

## 2019-12-22 DIAGNOSIS — R0789 Other chest pain: Secondary | ICD-10-CM | POA: Diagnosis not present

## 2019-12-22 MED ORDER — BUDESONIDE-FORMOTEROL FUMARATE 160-4.5 MCG/ACT IN AERO: 2.0000 | INHALATION_SPRAY | Freq: Two times a day (BID) | RESPIRATORY_TRACT | 2 refills | Status: DC

## 2019-12-22 NOTE — Patient Instructions (Signed)
Call us back and let us know what medication you have at home for your mood, so we can order a new prescription.

## 2019-12-22 NOTE — Progress Notes (Signed)
Assessment & Plan:  1. Chronic obstructive pulmonary disease, unspecified COPD type (Albert Allen) - Uncontrolled. Patient started on Symbicort twice daily. He is to continue Albuterol as needed.  - budesonide-formoterol (SYMBICORT) 160-4.5 MCG/ACT inhaler; Inhale 2 puffs into the lungs 2 (two) times daily.  Dispense: 1 Inhaler; Refill: 2  2. Depression, major, recurrent, moderate (Albert Allen) - Uncontrolled. Patient agreeable to starting medication to treat.  - sertraline (ZOLOFT) 25 MG tablet; Take 1 tablet (25 mg total) by mouth daily.  Dispense: 30 tablet; Refill: 2  3. Chest tightness - EKG 12-Lead   Follow up plan: Return in about 6 weeks (around 02/02/2020) for COPD/Mood with PCP.  Albert Limes, MSN, APRN, FNP-C Albert Allen  Subjective:   Patient ID: Albert Allen, male    DOB: Dec 26, 1922, 86 y.o.   MRN: [id number]  HPI: Albert Allen is a 86 y.o. male presenting on 12/22/2019 for Chest Pain (x 1 week and has gotten worse. Patient states that he has had this on and off for years.), Dizziness, and Anxiety (Patient states that he is very nervous this morning )  Patient c/o chest tightness/heaviness that has worsened over the past week. It has been on/off for several years. It is unrelated to food/drink. Albuterol does help; he is taking 1 puff 2-3 times every day. Also reports due to his COPD he is unable to take deep breaths.  Patient also is experiencing increased anxiety and depression. He reports since he lost his wife last year on November 26th his nerves have been really bad. He has had thoughts that he would be better off dead so that he could just be with her. He has no thoughts/plans to harm himself. He doesn't feel like himself and just wants to sleep, although he reports he doesn't sleep well and wakes every 2 hours. He has a prescription for Xanax which he takes to help with sleep.   Depression screen Albert Allen 2/9 12/22/2019 07/26/2019 01/25/2019  Decreased  Interest 3 0 0  Down, Depressed, Hopeless 3 0 0  PHQ - 2 Score 6 0 0  Altered sleeping 2 - -  Tired, decreased energy 3 - -  Change in appetite 0 - -  Feeling bad or failure about yourself  2 - -  Trouble concentrating 0 - -  Moving slowly or fidgety/restless 1 - -  Suicidal thoughts 1 - -  PHQ-9 Score 15 - -  Difficult doing work/chores Somewhat difficult - -  Some recent data might be hidden    ROS: Negative unless specifically indicated above in HPI.   Relevant past medical history reviewed and updated as indicated.   Allergies and medications reviewed and updated.   Current Outpatient Medications:  .  albuterol (VENTOLIN HFA) 108 (90 Base) MCG/ACT inhaler, INHALE 2 PUFFS EVERY SIX HOURS AS NEEDED FOR WHEEZING AND SHORT OF BREATH, Disp: 8.5 g, Rfl: 0 .  ALPRAZolam (XANAX) 0.5 MG tablet, TAKE ONE TABLET AT BEDTIME, Disp: 30 tablet, Rfl: 5 .  aspirin EC 81 MG tablet, Take 81 mg by mouth daily., Disp: , Rfl:  .  Cyanocobalamin (VITAMIN B-12 PO), Take by mouth., Disp: , Rfl:  .  diclofenac (VOLTAREN) 75 MG EC tablet, TAKE 1 TABLET TWICE DAILY AS NEEDED FOR JOINT OR MUSCLE PAIN, Disp: 60 tablet, Rfl: 0 .  levothyroxine (SYNTHROID, LEVOTHROID) 75 MCG tablet, Take 1 tablet (75 mcg total) by mouth daily before breakfast., Disp: 90 tablet, Rfl: 3 .  lubiprostone (AMITIZA) 8 MCG capsule,  TAKE  (1)  CAPSULE  TWICE DAILY WITH MEALS., Disp: 60 capsule, Rfl: 2 .  montelukast (SINGULAIR) 10 MG tablet, Take 1 tablet (10 mg total) by mouth at bedtime., Disp: 90 tablet, Rfl: 1 .  Multiple Vitamins-Minerals (PRESERVISION AREDS) CAPS, Take by mouth., Disp: , Rfl:  .  naproxen sodium (ALEVE) 220 MG tablet, Take 220 mg by mouth., Disp: , Rfl:  .  olmesartan (BENICAR) 40 MG tablet, TAKE 1 TABLET EVERY DAY, Disp: 90 tablet, Rfl: 1 .  pantoprazole (PROTONIX) 40 MG tablet, Take 1 tablet (40 mg total) by mouth daily., Disp: 90 tablet, Rfl: 3 .  pravastatin (PRAVACHOL) 40 MG tablet, TAKE 1 TABLET EVERY  EVENING, Disp: 90 tablet, Rfl: 2 .  silodosin (RAPAFLO) 4 MG CAPS capsule, Take 4 mg by mouth daily., Disp: , Rfl:  .  budesonide-formoterol (SYMBICORT) 160-4.5 MCG/ACT inhaler, Inhale 2 puffs into the lungs 2 (two) times daily., Disp: 1 Inhaler, Rfl: 2  Allergies  Allergen Reactions  . Amoxicillin Swelling and Other (See Comments)    Pt has swelling with PCN's  . Penicillins Swelling    Lips swelling Has patient had a PCN reaction causing immediate rash, facial/tongue/throat swelling, SOB or lightheadedness with hypotension: No Has patient had a PCN reaction causing severe rash involving mucus membranes or skin necrosis: No Has patient had a PCN reaction that required hospitalization No Has patient had a PCN reaction occurring within the last 10 years: No If all of the above answers are "NO", then may proceed with Cephalosporin use.  Albert Allen [Cefaclor] Other (See Comments)    Pt does not remember reaction  . Celebrex [Celecoxib] Other (See Comments)    Pt does not remember reaction  . Levaquin [Levofloxacin In D5w] Other (See Comments)    Pt does not remember reaction    Objective:   BP 139/69   Pulse 86   Temp 98.9 F (37.2 C) (Temporal)   Ht 5\' 7"  (1.702 m)   Wt 132 lb 6.4 oz (60.1 kg)   SpO2 100%   BMI 20.74 kg/m    Physical Exam Vitals reviewed.  Constitutional:      General: He is not in acute distress.    Appearance: Normal appearance. He is normal weight. He is not ill-appearing, toxic-appearing or diaphoretic.  HENT:     Head: Normocephalic and atraumatic.  Eyes:     General: No scleral icterus.       Right eye: No discharge.        Left eye: No discharge.     Conjunctiva/sclera: Conjunctivae normal.  Cardiovascular:     Rate and Rhythm: Normal rate and regular rhythm.     Heart sounds: Normal heart sounds. No murmur. No friction rub. No gallop.   Pulmonary:     Effort: Pulmonary effort is normal. No respiratory distress.     Breath sounds: Normal breath  sounds. No stridor. No wheezing, rhonchi or rales.  Musculoskeletal:        General: Normal range of motion.     Cervical back: Normal range of motion.  Skin:    General: Skin is warm and dry.  Neurological:     Mental Status: He is alert and oriented to person, place, and time. Mental status is at baseline.  Psychiatric:        Mood and Affect: Mood normal.        Behavior: Behavior normal.        Thought Content: Thought content normal.  Judgment: Judgment normal.    EKG: no change from last year

## 2019-12-24 ENCOUNTER — Encounter: Payer: Self-pay | Admitting: Family Medicine

## 2019-12-24 MED ORDER — SERTRALINE HCL 25 MG PO TABS: 25.0000 mg | ORAL_TABLET | Freq: Every day | ORAL | 2 refills | Status: DC

## 2019-12-24 NOTE — Telephone Encounter (Signed)
Addressed in visit encounter

## 2019-12-25 NOTE — Progress Notes (Signed)
Patient aware.

## 2020-02-12 ENCOUNTER — Other Ambulatory Visit: Payer: Self-pay | Admitting: Family Medicine

## 2020-03-04 ENCOUNTER — Other Ambulatory Visit: Payer: Self-pay | Admitting: Family Medicine

## 2020-03-04 DIAGNOSIS — J206 Acute bronchitis due to rhinovirus: Secondary | ICD-10-CM

## 2020-03-05 ENCOUNTER — Telehealth: Payer: Self-pay | Admitting: Family Medicine

## 2020-03-05 ENCOUNTER — Other Ambulatory Visit: Payer: Self-pay | Admitting: Family Medicine

## 2020-03-05 DIAGNOSIS — F411 Generalized anxiety disorder: Secondary | ICD-10-CM

## 2020-03-05 NOTE — Chronic Care Management (AMB) (Signed)
  Chronic Care Management   Note  03/05/2020 Name: RIYAN HAILE MRN: [id number] DOB: 03/16/1923  MERRELL BORSUK is a 86 y.o. year old male who is a primary care patient of Stacks, Cletus Gash, MD. I reached out to [name] by phone today in response to a referral sent by Mr. Rael Yo [facility] health plan.     Mr. Putman was given information about Chronic Care Management services today including:  1. CCM service includes personalized support from designated clinical staff supervised by his physician, including individualized plan of care and coordination with other care providers 2. 24/7 contact phone numbers for assistance for urgent and routine care needs. 3. Service will only be billed when office clinical staff spend 20 minutes or more in a month to coordinate care. 4. Only one practitioner may furnish and bill the service in a calendar month. 5. The patient may stop CCM services at any time (effective at the end of the month) by phone call to the office staff. 6. The patient will be responsible for cost sharing (co-pay) of up to 20% of the service fee (after annual deductible is met).  Patient did not agree to enrollment in care management services and does not wish to consider at this time.  Follow up plan: The patient has been provided with contact information for the care management team and has been advised to call with any health related questions or concerns.   [place], [place] [address] Direct Dial: [phone] [email or web address]: [email or web address]

## 2020-03-13 ENCOUNTER — Ambulatory Visit: Payer: Medicare Other | Admitting: Physician Assistant

## 2020-03-13 ENCOUNTER — Encounter: Payer: Self-pay | Admitting: Physician Assistant

## 2020-03-13 VITALS — BP 134/62 | HR 66 | Temp 97.9°F | Ht 67.0 in | Wt 131.0 lb

## 2020-03-13 DIAGNOSIS — K219 Gastro-esophageal reflux disease without esophagitis: Secondary | ICD-10-CM | POA: Diagnosis not present

## 2020-03-13 DIAGNOSIS — K2100 Gastro-esophageal reflux disease with esophagitis, without bleeding: Secondary | ICD-10-CM

## 2020-03-13 DIAGNOSIS — R1310 Dysphagia, unspecified: Secondary | ICD-10-CM | POA: Diagnosis not present

## 2020-03-13 MED ORDER — FAMOTIDINE 40 MG PO TABS: 40.0000 mg | ORAL_TABLET | Freq: Every day | ORAL | 11 refills | Status: DC

## 2020-03-13 MED ORDER — PANTOPRAZOLE SODIUM 40 MG PO TBEC: 40.0000 mg | DELAYED_RELEASE_TABLET | Freq: Every day | ORAL | 3 refills | Status: DC

## 2020-03-13 NOTE — Progress Notes (Signed)
Subjective:    Patient ID: Albert Allen, male    DOB: March 14, 1923, 86 y.o.   MRN: [id number]  HPI Dezmend is a pleasant 86 year old white male, established with Dr. Hilarie Fredrickson, who was last seen in November 2020 and comes in today with complaints of increase in reflux symptoms and intermittent dysphagia. He has history of coronary artery disease, asthma, EtOH induced polyneuropathy, BPH, history of colon polyps and diverticulosis.  Also with chronic constipation currently on Amitiza which she says is working well. Patient has chronic GERD and known large hiatal hernia with last EGD August 2015 showing a 6 cm hiatal hernia and a nonobstructing partial ring at GE junction, this was not dilated. He had undergone barium swallow in July 2019 which showed a patent esophagus with no stricture there was a 4 mm Zenker's, moderate hiatal hernia with significant reflux, tablet passed without difficulty. He has been on Protonix 40 mg p.o. every morning chronically.  He says over the past few months he has had more difficulty with raspy hoarse voice, and pill dysphagia.  He is also had some intermittent burning in his esophagus, at times with eating and intermittent episodes of solid food dysphagia with feeling as if food is "hanging up".  He has not had any episodes requiring regurgitation. He does feel that he has been generally anxious for many months and has not been sleeping well.  His wife of 73 years passed last fall I believe.  He has been taking half of his Xanax at bedtime but says he will still only sleep a couple of hours if he does fall asleep.  He has frequently been taking 3 or 4 shots of alcohol close to bedtime to help himself sleep.  He wonders if this may be aggravating the hiatal hernia.  Review of Systems Pertinent positive and negative review of systems were noted in the above HPI section.  All other review of systems was otherwise negative.  Outpatient Encounter Medications as of 03/13/2020    Medication Sig  . albuterol (VENTOLIN HFA) 108 (90 Base) MCG/ACT inhaler INHALE 2 PUFFS EVERY SIX HOURS AS NEEDED FOR WHEEZING AND SHORT OF BREATH  . ALPRAZolam (XANAX) 0.5 MG tablet TAKE ONE TABLET AT BEDTIME  . aspirin EC 81 MG tablet Take 81 mg by mouth daily.  . Cyanocobalamin (VITAMIN B-12 PO) Take by mouth.  . diclofenac (VOLTAREN) 75 MG EC tablet TAKE 1 TABLET TWICE DAILY AS NEEDED FOR JOINT OR MUSCLE PAIN  . levothyroxine (SYNTHROID) 75 MCG tablet Take 1 tablet (75 mcg total) by mouth daily before breakfast.  . lubiprostone (AMITIZA) 8 MCG capsule TAKE  (1)  CAPSULE  TWICE DAILY WITH MEALS.  . montelukast (SINGULAIR) 10 MG tablet Take 1 tablet (10 mg total) by mouth at bedtime.  . Multiple Vitamins-Minerals (PRESERVISION AREDS) CAPS Take by mouth.  . olmesartan (BENICAR) 40 MG tablet TAKE 1 TABLET EVERY DAY  . pantoprazole (PROTONIX) 40 MG tablet Take 1 tablet (40 mg total) by mouth daily.  . pravastatin (PRAVACHOL) 40 MG tablet TAKE 1 TABLET EVERY EVENING  . silodosin (RAPAFLO) 4 MG CAPS capsule Take 4 mg by mouth daily.  . [DISCONTINUED] pantoprazole (PROTONIX) 40 MG tablet Take 1 tablet (40 mg total) by mouth daily.  . famotidine (PEPCID) 40 MG tablet Take 1 tablet (40 mg total) by mouth at bedtime.  . [DISCONTINUED] budesonide-formoterol (SYMBICORT) 160-4.5 MCG/ACT inhaler Inhale 2 puffs into the lungs 2 (two) times daily.  . [DISCONTINUED] naproxen sodium (ALEVE) 220  MG tablet Take 220 mg by mouth.  . [DISCONTINUED] sertraline (ZOLOFT) 25 MG tablet Take 1 tablet (25 mg total) by mouth daily.   No facility-administered encounter medications on file as of 03/13/2020.   Allergies  Allergen Reactions  . Amoxicillin Swelling and Other (See Comments)    Pt has swelling with PCN's  . Penicillins Swelling    Lips swelling Has patient had a PCN reaction causing immediate rash, facial/tongue/throat swelling, SOB or lightheadedness with hypotension: No Has patient had a PCN reaction  causing severe rash involving mucus membranes or skin necrosis: No Has patient had a PCN reaction that required hospitalization No Has patient had a PCN reaction occurring within the last 10 years: No If all of the above answers are "NO", then may proceed with Cephalosporin use.  Blair Dolphin [Cefaclor] Other (See Comments)    Pt does not remember reaction  . Celebrex [Celecoxib] Other (See Comments)    Pt does not remember reaction  . Levaquin [Levofloxacin In D5w] Other (See Comments)    Pt does not remember reaction   Patient Active Problem List   Diagnosis Date Noted  . Burn 02/19/2017  . Insomnia 08/04/2016  . Midline low back pain without sciatica 04/20/2016  . Right inguinal hernia 12/18/2015  . Seasonal allergic rhinitis 04/17/2013  . BPH (benign prostatic hyperplasia) 04/17/2013  . Diverticulosis of large intestine 06/15/2011  . Hernia, hiatal 06/15/2011  . Diverticulum of duodenum 06/15/2011  . PERSONAL HX COLONIC POLYPS 03/25/2010  . PSA, INCREASED 03/20/2010  . CAD, NATIVE VESSEL 04/04/2009  . Hyperlipidemia 03/27/2009  . LEFT BUNDLE BRANCH BLOCK 02/11/2009  . HEPATOMEGALY 09/25/2008  . ANEMIA, B12 DEFICIENCY 03/29/2008  . ALCOHOLIC POLYNEUROPATHY [id number]  . Hypothyroidism 05/31/2007  . ABUSE, ALCOHOL, IN REMISSION 05/31/2007  . Essential hypertension 05/31/2007  . Asthma 05/31/2007  . GERD 05/31/2007  . Osteoarthritis 05/31/2007   Social History   Socioeconomic History  . Marital status: Married    Spouse name: Not on file  . Number of children: 2  . Years of education: 9  . Highest education level: 9th grade  Occupational History  . Occupation: retired    Comment: Oncologist  Tobacco Use  . Smoking status: Former Smoker    Types: Cigarettes    Quit date: 04/24/1998    Years since quitting: 21.9  . Smokeless tobacco: Never Used  Substance and Sexual Activity  . Alcohol use: Yes    Alcohol/week: 5.0 standard drinks    Types: 5 Shots of liquor per  week    Comment: Does not drink weekly but does buy a pint of liqour occasionally and will drink most of the bottle when he does  . Drug use: No  . Sexual activity: Yes  Other Topics Concern  . Not on file  Social History Narrative  . Not on file   Social Determinants of Health   Financial Resource Strain:   . Difficulty of Paying Living Expenses:   Food Insecurity:   . Worried About Charity fundraiser in the Last Year:   . Arboriculturist in the Last Year:   Transportation Needs:   . Film/video editor (Medical):   Marland Kitchen Lack of Transportation (Non-Medical):   Physical Activity:   . Days of Exercise per Week:   . Minutes of Exercise per Session:   Stress:   . Feeling of Stress :   Social Connections:   . Frequency of Communication with Friends and Family:   .  Frequency of Social Gatherings with Friends and Family:   . Attends Religious Services:   . Active Member of Clubs or Organizations:   . Attends Archivist Meetings:   Marland Kitchen Marital Status:   Intimate Partner Violence:   . Fear of Current or Ex-Partner:   . Emotionally Abused:   Marland Kitchen Physically Abused:   . Sexually Abused:     Mr. Macari family history includes Alcohol abuse in his father; Cancer in his brother and mother; Diabetes in his brother, sister, and sister; Lung disease in his father.      Objective:    Vitals:   03/13/20 1331  BP: 134/62  Pulse: 66  Temp: 97.9 F (36.6 C)    Physical Exam Well-developed well-nourished very elderly white male  in no acute distress.  Accompanied by his son height, Weight, 131 BMI 20.52  HEENT; nontraumatic normocephalic, EOMI, PE RR LA, sclera anicteric. Oropharynx; not examined today neck; supple, no JVD Cardiovascular; regular rate and rhythm with S1-S2, no murmur rub or gallop Pulmonary; Clear bilaterally Abdomen; soft, nontender, nondistended, no palpable mass or hepatosplenomegaly, bowel sounds are active Rectal; not done today Skin; benign exam,  no jaundice rash or appreciable lesions Extremities; no clubbing cyanosis or edema skin warm and dry Neuro/Psych; alert and oriented x4, grossly nonfocal mood and affect appropriate       Assessment & Plan:   #9 86 year old white male with chronic GERD, on Protonix 40 mg p.o. every morning with exacerbation of symptoms over the past several months with rest be hoarse voice, frequent pill dysphagia and intermittent solid food dysphagia.  He has also been having intermittent heartburn. Rule out esophageal stricture versus progression of previously documented GE junction ring.  #2 known large hiatal hernia, 6 cm #3 chronic constipation-stable on Amitiza 8 mcg twice daily #4 insomnia/anxiety-suspect underlying depression #5 coronary artery disease #6 EtOH use/overuse  Plan; continue Protonix 40 mg p.o. every morning Add Pepcid 40 mg AC dinner daily, prescription sent. Continue Amitiza 8 mcg twice daily We discussed option of repeat barium swallow versus proceeding to EGD with probable dilation.  Patient would like to proceed with EGD and dilation.  Procedure was discussed in detail with patient including indications risks and benefits and he is agreeable to proceed.  This will be scheduled with Dr. Hilarie [facility]rickson in the [facility]. Patient has been vaccinated for COVID-19. I asked him to discuss his anxiety/insomnia further with his PCP-question consider trial of Remeron.  Plan  Shai Mckenzie Genia Harold PA-C 03/13/2020   Cc: [name], MD

## 2020-03-13 NOTE — Patient Instructions (Signed)
If you are age 84 or older, your body mass index should be between 23-30. Your Body mass index is 20.52 kg/m. If this is out of the aforementioned range listed, please consider follow up with your Primary Care Provider.  If you are age 53 or younger, your body mass index should be between 19-25. Your Body mass index is 20.52 kg/m. If this is out of the aformentioned range listed, please consider follow up with your Primary Care Provider.   You have been scheduled for an endoscopy. Please follow written instructions given to you at your visit today. If you use inhalers (even only as needed), please bring them with you on the day of your procedure.  Start Pepcid 40mg - once by mouth at dinner time.   We have sent the following medications to your pharmacy for you to pick up at your convenience: Pepcid  Due to recent changes in healthcare laws, you may see the results of your imaging and laboratory studies on MyChart before your provider has had a chance to review them.  We understand that in some cases there may be results that are confusing or concerning to you. Not all laboratory results come back in the same time frame and the provider may be waiting for multiple results in order to interpret others.  Please give [place] 48 hours in order for your provider to thoroughly review all the results before contacting the office for clarification of your results.   Thank you for choosing me and [facility].  Amy Esterwood-PA

## 2020-03-18 ENCOUNTER — Other Ambulatory Visit: Payer: Self-pay | Admitting: Family Medicine

## 2020-03-18 DIAGNOSIS — N138 Other obstructive and reflux uropathy: Secondary | ICD-10-CM | POA: Diagnosis not present

## 2020-03-18 NOTE — Progress Notes (Signed)
Addendum: Reviewed and agree with assessment and management plan. Yaniyah Koors M, MD  

## 2020-03-19 ENCOUNTER — Encounter: Payer: Self-pay | Admitting: Family Medicine

## 2020-03-19 ENCOUNTER — Other Ambulatory Visit: Payer: Self-pay

## 2020-03-19 ENCOUNTER — Ambulatory Visit (INDEPENDENT_AMBULATORY_CARE_PROVIDER_SITE_OTHER): Payer: Medicare Other | Admitting: Family Medicine

## 2020-03-19 VITALS — BP 131/66 | HR 80 | Temp 98.0°F | Ht 67.0 in | Wt 133.0 lb

## 2020-03-19 DIAGNOSIS — Z79899 Other long term (current) drug therapy: Secondary | ICD-10-CM

## 2020-03-19 DIAGNOSIS — F1320 Sedative, hypnotic or anxiolytic dependence, uncomplicated: Secondary | ICD-10-CM

## 2020-03-20 ENCOUNTER — Encounter: Payer: Self-pay | Admitting: Family Medicine

## 2020-03-20 NOTE — Progress Notes (Signed)
Subjective:  Patient ID: Albert Allen, male    DOB: [birth year]  Age: 86 y.o. MRN: [id number]  CC: Follow-up (6 month)   HPI Albert Allen presents for follow-up concerning his use of benzodiazepine medication.  He takes alprazolam 0.5 mg at bedtime.  This helps him sleep better without worrying or ruminating over the days events.  His PDMP report was reviewed.  He has NARx score is of 130 for narcotics and to 84 sedatives.  I am his own prescriber of record.  His prescriptions have been filled appropriately on schedule.  His lorazepam equivalency score is 1.00.  LME.  Depression screen [facility] 2/9 03/19/2020 12/22/2019 07/26/2019  Decreased Interest 0 3 0  Down, Depressed, Hopeless 0 3 0  PHQ - 2 Score 0 6 0  Altered sleeping - 2 -  Tired, decreased energy - 3 -  Change in appetite - 0 -  Feeling bad or failure about yourself  - 2 -  Trouble concentrating - 0 -  Moving slowly or fidgety/restless - 1 -  Suicidal thoughts - 1 -  PHQ-9 Score - 15 -  Difficult doing work/chores - Somewhat difficult -  Some recent data might be hidden    History Albert Allen has a past medical history of ABNORMAL CV (STRESS) TEST (03/05/2009), ABUSE, ALCOHOL, IN REMISSION (05/31/2007), Adhesive capsulitis of shoulder ([id number]), Alcoholic polyneuropathy ([place]) (03/29/2008), ANEMIA, B12 DEFICIENCY (03/29/2008), Anxiety state, unspecified (11/19/2008), ASTHMA (05/31/2007), CAD, NATIVE VESSEL (04/04/2009), CARPAL TUNNEL SYNDROME, RIGHT (09/05/2007), Chronic prostatitis (03/29/2008), Crohn's disease ([place]), Degenerative disc disease, cervical, DEGENERATIVE DISC DISEASE, CERVICAL SPINE (06/22/2007), Diverticulosis, DYSPHAGIA UNSPECIFIED (09/25/2008), Esophageal stricture, External hemorrhoids, GERD (05/31/2007), GOUT (05/31/2007), Hepatomegaly (09/25/2008), Hiatal hernia, HIP PAIN (03/20/2010), History of echocardiogram, HYPERLIPIDEMIA (03/27/2009), HYPERTENSION (05/31/2007), HYPOTHYROIDISM (05/31/2007), LEFT BUNDLE BRANCH BLOCK (02/11/2009),  Liver hemangioma, Liver mass, left lobe, LOC OSTEOARTHROS NOT SPEC PRIM/SEC LOWER LEG (03/20/2010), OSTEOARTHRITIS (05/31/2007), PERSONAL HX COLONIC POLYPS (03/05/1997), PSA, INCREASED (03/20/2010), SINUSITIS, CHRONIC NOS (05/31/2007), Sore throat, SYNCOPE (11/19/2008), and WEIGHT LOSS, RECENT (11/19/2008).   He has a past surgical history that includes pul. colapse w/chest tube; Tonsillectomy; carpel tunnel release; ORIF ulnar fracture (11/03/2012); Eye surgery; Inguinal hernia repair (12/18/2015); Inguinal hernia repair (Right, 12/18/2015); and Insertion of mesh (Right, 12/18/2015).   His family history includes Alcohol abuse in his father; Cancer in his brother and mother; Diabetes in his brother, sister, and sister; Lung disease in his father.He reports that he quit smoking about 21 years ago. His smoking use included cigarettes. He has never used smokeless tobacco. He reports current alcohol use of about 5.0 standard drinks of alcohol per week. He reports that he does not use drugs.    ROS Review of Systems  Constitutional: Negative for fever.  Respiratory: Negative for shortness of breath.   Cardiovascular: Negative for chest pain.  Musculoskeletal: Negative for arthralgias.  Skin: Negative for rash.    Objective:  BP 131/66   Pulse 80   Temp 98 F (36.7 C) (Temporal)   Ht 5\' 7"  (1.702 m)   Wt 133 lb (60.3 kg)   BMI 20.83 kg/m   BP Readings from Last 3 Encounters:  03/19/20 131/66  03/13/20 134/62  12/22/19 139/69    Wt Readings from Last 3 Encounters:  03/19/20 133 lb (60.3 kg)  03/13/20 131 lb (59.4 kg)  12/22/19 132 lb 6.4 oz (60.1 kg)     Physical Exam Vitals reviewed.  Constitutional:      Appearance: He is well-developed.  HENT:  Head: Normocephalic and atraumatic.     Right Ear: External ear normal.     Left Ear: External ear normal.     Mouth/Throat:     Pharynx: No oropharyngeal exudate or posterior oropharyngeal erythema.  Eyes:     Pupils: Pupils are equal,  round, and reactive to light.  Cardiovascular:     Rate and Rhythm: Normal rate and regular rhythm.     Heart sounds: No murmur.  Pulmonary:     Effort: No respiratory distress.     Breath sounds: Normal breath sounds.  Musculoskeletal:     Cervical back: Normal range of motion and neck supple.  Neurological:     Mental Status: He is alert and oriented to person, place, and time.       Assessment & Plan:   Albert Allen was seen today for follow-up.  Diagnoses and all orders for this visit:  Controlled substance agreement signed -     ToxASSURE Select 13 (MW), Urine  Benzodiazepine dependence ([place])   Patient is using a minimal amount of medicine with fairly maximal benefit per history.  Therefore I told him he could go ahead with the current dose but no increases could be allowed.  Pain contract reviewed patient understands although he does not particularly agree with it.  He is willing to abide by it however.    I am having Albert Allen maintain his aspirin EC, Cyanocobalamin (VITAMIN B-12 PO), montelukast, pravastatin, PreserVision AREDS, ALPRAZolam, lubiprostone, silodosin, diclofenac, albuterol, levothyroxine, pantoprazole, famotidine, and olmesartan.  Allergies as of 03/19/2020      Reactions   Amoxicillin Swelling, Other (See Comments)   Pt has swelling with PCN's   Penicillins Swelling   Lips swelling Has patient had a PCN reaction causing immediate rash, facial/tongue/throat swelling, SOB or lightheadedness with hypotension: No Has patient had a PCN reaction causing severe rash involving mucus membranes or skin necrosis: No Has patient had a PCN reaction that required hospitalization No Has patient had a PCN reaction occurring within the last 10 years: No If all of the above answers are "NO", then may proceed with Cephalosporin use.   Ceclor [cefaclor] Other (See Comments)   Pt does not remember reaction   Celebrex [celecoxib] Other (See Comments)   Pt does not  remember reaction   Levaquin [levofloxacin In D5w] Other (See Comments)   Pt does not remember reaction      Medication List       Accurate as of March 19, 2020 11:59 PM. If you have any questions, ask your nurse or doctor.        albuterol 108 (90 Base) MCG/ACT inhaler Commonly known as: VENTOLIN HFA INHALE 2 PUFFS EVERY SIX HOURS AS NEEDED FOR WHEEZING AND SHORT OF BREATH   ALPRAZolam 0.5 MG tablet Commonly known as: XANAX TAKE ONE TABLET AT BEDTIME   aspirin EC 81 MG tablet Take 81 mg by mouth daily.   diclofenac 75 MG EC tablet Commonly known as: VOLTAREN TAKE 1 TABLET TWICE DAILY AS NEEDED FOR JOINT OR MUSCLE PAIN   famotidine 40 MG tablet Commonly known as: Pepcid Take 1 tablet (40 mg total) by mouth at bedtime.   levothyroxine 75 MCG tablet Commonly known as: SYNTHROID Take 1 tablet (75 mcg total) by mouth daily before breakfast.   lubiprostone 8 MCG capsule Commonly known as: Amitiza TAKE  (1)  CAPSULE  TWICE DAILY WITH MEALS.   montelukast 10 MG tablet Commonly known as: SINGULAIR Take 1 tablet (10 mg  total) by mouth at bedtime.   olmesartan 40 MG tablet Commonly known as: BENICAR Take 1 tablet (40 mg total) by mouth daily. Needs to be seen for further refills.   pantoprazole 40 MG tablet Commonly known as: PROTONIX Take 1 tablet (40 mg total) by mouth daily.   pravastatin 40 MG tablet Commonly known as: PRAVACHOL TAKE 1 TABLET EVERY EVENING   PreserVision AREDS Caps Take by mouth.   silodosin 4 MG Caps capsule Commonly known as: RAPAFLO Take 4 mg by mouth daily.   VITAMIN B-12 PO Take by mouth.        Follow-up: No follow-ups on file.  [name], M.D.

## 2020-03-21 ENCOUNTER — Telehealth: Payer: Self-pay | Admitting: Family Medicine

## 2020-03-21 ENCOUNTER — Other Ambulatory Visit: Payer: Self-pay | Admitting: Family Medicine

## 2020-03-21 DIAGNOSIS — F411 Generalized anxiety disorder: Secondary | ICD-10-CM

## 2020-03-21 NOTE — Telephone Encounter (Signed)
Tried calling patient. Pt seen recently by Dr. Livia Snellen. He did sign CSA and toxassure was ordered. However, I did not see a result.  Did pt leave a urine? Pt did not answer home number and their was not a voicemail.

## 2020-03-22 LAB — TOXASSURE SELECT 13 (MW), URINE

## 2020-03-22 NOTE — Telephone Encounter (Signed)
Patient aware, provider not working today.   Please review and advise on refill.

## 2020-03-25 ENCOUNTER — Telehealth: Payer: Self-pay | Admitting: Family Medicine

## 2020-03-25 NOTE — Telephone Encounter (Signed)
Please contact the patient I went ahead with the medicine even though he didn't leave a urine specimen for his toxassure. He will need to come in for the test. ASAP. WS

## 2020-03-25 NOTE — Telephone Encounter (Signed)
Pt informed that Rx is at the pharmacy. Pt came by on 03/19/2020 and left urine for drug screen. Messaged forwarded to Dr. Livia Snellen.

## 2020-03-27 ENCOUNTER — Encounter: Payer: Self-pay | Admitting: Internal Medicine

## 2020-03-27 ENCOUNTER — Other Ambulatory Visit: Payer: Self-pay

## 2020-03-27 ENCOUNTER — Ambulatory Visit (AMBULATORY_SURGERY_CENTER): Payer: Medicare Other | Admitting: Internal Medicine

## 2020-03-27 VITALS — BP 110/63 | HR 68 | Temp 96.8°F | Resp 11 | Ht 67.0 in | Wt 131.0 lb

## 2020-03-27 DIAGNOSIS — K259 Gastric ulcer, unspecified as acute or chronic, without hemorrhage or perforation: Secondary | ICD-10-CM | POA: Diagnosis not present

## 2020-03-27 DIAGNOSIS — K253 Acute gastric ulcer without hemorrhage or perforation: Secondary | ICD-10-CM | POA: Diagnosis not present

## 2020-03-27 DIAGNOSIS — K219 Gastro-esophageal reflux disease without esophagitis: Secondary | ICD-10-CM | POA: Diagnosis not present

## 2020-03-27 DIAGNOSIS — K225 Diverticulum of esophagus, acquired: Secondary | ICD-10-CM

## 2020-03-27 DIAGNOSIS — K2950 Unspecified chronic gastritis without bleeding: Secondary | ICD-10-CM | POA: Diagnosis not present

## 2020-03-27 MED ORDER — PANTOPRAZOLE SODIUM 40 MG PO TBEC
40.0000 mg | DELAYED_RELEASE_TABLET | Freq: Two times a day (BID) | ORAL | 11 refills | Status: DC
Start: 2020-03-27 — End: 2020-06-05

## 2020-03-27 MED ORDER — SODIUM CHLORIDE 0.9 % IV SOLN: 500.0000 mL | Freq: Once | INTRAVENOUS | Status: DC

## 2020-03-27 NOTE — Op Note (Signed)
[place] Patient Name: Albert Allen Procedure Date: 03/27/2020 8:35 AM MRN: [id number] Endoscopist: Jerene Bears , MD Age: 86 Referring MD:  Date of Birth: 1923-11-10 Gender: Male Account #: [id number] Procedure:                Upper GI endoscopy Indications:              Gastro-esophageal reflux disease with recent flare                            despite once daily PPI, intermittent pill and solid                            food dysphagia Medicines:                Monitored Anesthesia Care Procedure:                Pre-Anesthesia Assessment:                           - Prior to the procedure, a History and Physical                            was performed, and patient medications and                            allergies were reviewed. The patient's tolerance of                            previous anesthesia was also reviewed. The risks                            and benefits of the procedure and the sedation                            options and risks were discussed with the patient.                            All questions were answered, and informed consent                            was obtained. Prior Anticoagulants: The patient has                            taken no previous anticoagulant or antiplatelet                            agents. ASA Grade Assessment: III - A patient with                            severe systemic disease. After reviewing the risks                            and benefits, the patient was deemed in  satisfactory condition to undergo the procedure.                           After obtaining informed consent, the endoscope was                            passed under direct vision. Throughout the                            procedure, the patient's blood pressure, pulse, and                            oxygen saturations were monitored continuously. The                            Endoscope was introduced through the  mouth, and                            advanced to the second part of duodenum. The upper                            GI endoscopy was accomplished without difficulty.                            The patient tolerated the procedure well. Scope In: Scope Out: Findings:                 LA Grade B (one or more mucosal breaks greater than                            5 mm, not extending between the tops of two mucosal                            folds) esophagitis with no bleeding was found 34 to                            35 cm from the incisors.                           A 5 cm hiatal hernia was present (located 35 to 40                            cm from the incisors).                           The gastroesophageal flap valve was visualized                            endoscopically and classified as Hill Grade IV (no                            fold, wide open lumen, hiatal hernia present).  Moderate inflammation characterized by erosions,                            erythema and granularity was found on the greater                            curvature of the gastric body. Biopsies were taken                            with a cold forceps for histology and Helicobacter                            pylori testing.                           A medium diverticulum was found in the area of the                            papilla.                           The exam of the duodenum was otherwise normal. Complications:            No immediate complications. Estimated Blood Loss:     Estimated blood loss was minimal. Impression:               - LA Grade B reflux esophagitis with no bleeding.                           - 5 cm hiatal hernia.                           - Acute gastritis. Biopsied.                           - Duodenal diverticulum. Recommendation:           - Patient has a contact number available for                            emergencies. The signs and symptoms of  potential                            delayed complications were discussed with the                            patient. Return to normal activities tomorrow.                            Written discharge instructions were provided to the                            patient.                           - Resume previous diet.                           -  Continue present medications. Given recent flare                            of GERD symptoms and esophagitis + gastritis seen                            today, increase pantoprazole to 40 mg twice daily                            before 1st and last meal of the day. Would continue                            with this twice daily dosing for 4-6 weeks and then                            if symptoms improve you can return to once daily                            pantoprazole. Twice daily can be used as needed                            thereafter if effective.                           - Await pathology results. Jerene Bears, MD 03/27/2020 8:58:44 AM This report has been signed electronically.

## 2020-03-27 NOTE — Progress Notes (Signed)
Called to room to assist during endoscopic procedure.  Patient ID and intended procedure confirmed with present staff. Received instructions for my participation in the procedure from the performing physician.  

## 2020-03-27 NOTE — Progress Notes (Signed)
Report to PACU, RN, vss, BBS= Clear.  

## 2020-03-27 NOTE — Progress Notes (Signed)
Temp-JB VS-DT 

## 2020-03-27 NOTE — Patient Instructions (Signed)
Please read handouts provided. Await pathology results. Increase pantoprazole to 40 mg twice daily before !st and last meal of the day. Continue twice daily for 4-6 weeks, and if symptoms improve can return to once daily. Twice daily can be used as needed if effective. Continue present medications.     YOU HAD AN ENDOSCOPIC PROCEDURE TODAY AT [facility]:   Refer to the procedure report that was given to you for any specific questions about what was found during the examination.  If the procedure report does not answer your questions, please call your gastroenterologist to clarify.  If you requested that your care partner not be given the details of your procedure findings, then the procedure report has been included in a sealed envelope for you to review at your convenience later.  YOU SHOULD EXPECT: Some feelings of bloating in the abdomen. Passage of more gas than usual.  Walking can help get rid of the air that was put into your GI tract during the procedure and reduce the bloating. If you had a lower endoscopy (such as a colonoscopy or flexible sigmoidoscopy) you may notice spotting of blood in your stool or on the toilet paper. If you underwent a bowel prep for your procedure, you may not have a normal bowel movement for a few days.  Please Note:  You might notice some irritation and congestion in your nose or some drainage.  This is from the oxygen used during your procedure.  There is no need for concern and it should clear up in a day or so.  SYMPTOMS TO REPORT IMMEDIATELY:     Following upper endoscopy (EGD)  Vomiting of blood or coffee ground material  New chest pain or pain under the shoulder blades  Painful or persistently difficult swallowing  New shortness of breath  Fever of 100F or higher  Black, tarry-looking stools  For urgent or emergent issues, a gastroenterologist can be reached at any hour by calling [phone]. Do not use MyChart messaging for  urgent concerns.    DIET:  We do recommend a small meal at first, but then you may proceed to your regular diet.  Drink plenty of fluids but you should avoid alcoholic beverages for 24 hours.  ACTIVITY:  You should plan to take it easy for the rest of today and you should NOT DRIVE or use heavy machinery until tomorrow (because of the sedation medicines used during the test).    FOLLOW UP: Our staff will call the number listed on your records 48-72 hours following your procedure to check on you and address any questions or concerns that you may have regarding the information given to you following your procedure. If we do not reach you, we will leave a message.  We will attempt to reach you two times.  During this call, we will ask if you have developed any symptoms of COVID 19. If you develop any symptoms (ie: fever, flu-like symptoms, shortness of breath, cough etc.) before then, please call [phone].  If you test positive for Covid 19 in the 2 weeks post procedure, please call and report this information to [place].    If any biopsies were taken you will be contacted by phone or by letter within the next 1-3 weeks.  Please call us at [phone] if you have not heard about the biopsies in 3 weeks.    SIGNATURES/CONFIDENTIALITY: You and/or your care partner have signed paperwork which will be entered into your electronic  medical record.  These signatures attest to the fact that that the information above on your After Visit Summary has been reviewed and is understood.  Full responsibility of the confidentiality of this discharge information lies with you and/or your care-partner.

## 2020-03-29 ENCOUNTER — Telehealth: Payer: Self-pay

## 2020-03-29 NOTE — Telephone Encounter (Signed)
  Follow up Call-  Call back number 03/27/2020  Post procedure Call Back phone  # [phone]  Permission to leave phone message Yes  Some recent data might be hidden     Patient questions:  Do you have a fever, pain , or abdominal swelling? No. Pain Score  0 *  Have you tolerated food without any problems? Yes.    Have you been able to return to your normal activities? Yes.    Do you have any questions about your discharge instructions: Diet   No. Medications  No. Follow up visit  No.  Do you have questions or concerns about your Care? No.  Actions: * If pain score is 4 or above: No action needed, pain <4. 1. Have you developed a fever since your procedure? no  2.   Have you had an respiratory symptoms (SOB or cough) since your procedure? no  3.   Have you tested positive for COVID 19 since your procedure no  4.   Have you had any family members/close contacts diagnosed with the COVID 19 since your procedure?  no   If yes to any of these questions please route to Joylene John, RN and Erenest Rasher, RN

## 2020-04-01 ENCOUNTER — Encounter: Payer: Self-pay | Admitting: Internal Medicine

## 2020-04-15 ENCOUNTER — Other Ambulatory Visit: Payer: Self-pay | Admitting: Family Medicine

## 2020-04-23 ENCOUNTER — Other Ambulatory Visit: Payer: Self-pay | Admitting: Family Medicine

## 2020-04-23 DIAGNOSIS — F411 Generalized anxiety disorder: Secondary | ICD-10-CM

## 2020-05-30 ENCOUNTER — Other Ambulatory Visit: Payer: Self-pay | Admitting: Cardiovascular Disease

## 2020-06-04 ENCOUNTER — Other Ambulatory Visit: Payer: Self-pay

## 2020-06-04 ENCOUNTER — Other Ambulatory Visit: Payer: Self-pay | Admitting: *Deleted

## 2020-06-04 DIAGNOSIS — J206 Acute bronchitis due to rhinovirus: Secondary | ICD-10-CM

## 2020-06-04 MED ORDER — FAMOTIDINE 40 MG PO TABS: 40.0000 mg | ORAL_TABLET | Freq: Every day | ORAL | 3 refills | Status: DC

## 2020-06-04 MED ORDER — LEVOTHYROXINE SODIUM 75 MCG PO TABS: 75.0000 ug | ORAL_TABLET | Freq: Every day | ORAL | 0 refills | Status: DC

## 2020-06-04 MED ORDER — ALBUTEROL SULFATE HFA 108 (90 BASE) MCG/ACT IN AERS: INHALATION_SPRAY | RESPIRATORY_TRACT | 0 refills | Status: DC

## 2020-06-05 ENCOUNTER — Other Ambulatory Visit: Payer: Self-pay | Admitting: *Deleted

## 2020-06-05 MED ORDER — PANTOPRAZOLE SODIUM 40 MG PO TBEC: 40.0000 mg | DELAYED_RELEASE_TABLET | Freq: Every day | ORAL | 2 refills | Status: DC

## 2020-06-06 ENCOUNTER — Telehealth: Payer: Self-pay | Admitting: *Deleted

## 2020-06-06 ENCOUNTER — Other Ambulatory Visit: Payer: Self-pay | Admitting: Family Medicine

## 2020-06-06 DIAGNOSIS — F411 Generalized anxiety disorder: Secondary | ICD-10-CM

## 2020-06-06 MED ORDER — ALPRAZOLAM 0.5 MG PO TABS: 0.5000 mg | ORAL_TABLET | Freq: Every day | ORAL | 4 refills | Status: DC

## 2020-06-06 MED ORDER — ALBUTEROL SULFATE HFA 108 (90 BASE) MCG/ACT IN AERS: 2.0000 | INHALATION_SPRAY | RESPIRATORY_TRACT | 5 refills | Status: DC | PRN

## 2020-06-06 NOTE — Telephone Encounter (Signed)
I sent in the [place]

## 2020-06-06 NOTE — Telephone Encounter (Signed)
Fax from [facility] Ventolin HFA AE 90 mcg is not covered by pt's ins Alternative: Proair HFA Please provide new prescription or have a PA done

## 2020-06-06 NOTE — Telephone Encounter (Signed)
Patient aware, message on vm,  script is ready.

## 2020-06-07 ENCOUNTER — Other Ambulatory Visit: Payer: Self-pay | Admitting: Internal Medicine

## 2020-07-08 ENCOUNTER — Other Ambulatory Visit: Payer: Self-pay | Admitting: Family Medicine

## 2020-07-16 ENCOUNTER — Other Ambulatory Visit: Payer: Self-pay | Admitting: Family Medicine

## 2020-08-05 ENCOUNTER — Other Ambulatory Visit: Payer: Self-pay | Admitting: Family Medicine

## 2020-08-05 DIAGNOSIS — J206 Acute bronchitis due to rhinovirus: Secondary | ICD-10-CM

## 2020-08-06 NOTE — Telephone Encounter (Signed)
Next Ov 09/18/20

## 2020-08-13 ENCOUNTER — Other Ambulatory Visit: Payer: Self-pay | Admitting: Internal Medicine

## 2020-08-16 ENCOUNTER — Telehealth: Payer: Self-pay | Admitting: Internal Medicine

## 2020-08-16 NOTE — Telephone Encounter (Signed)
Pt is requesting a call back from a nurse in regards to his Amitiza, pt states he is experiencing dizziness.

## 2020-08-16 NOTE — Telephone Encounter (Signed)
Pt states he has been taking amitiza since April and pt thinks he has been feeling dizzy since then. Reports his BP has been "checking low", today on his auto cuff it was 121/59. He states his head feels foolish and full of saw dust, he states he "does not feel like doing much but he is 95." He is still driving and reports he is still able to see the road pretty good. Pt also reports he take xanax at night to help with sleep and it can make you dizzy also. Discussed with pt that he should contact he PCP as there might be several issues that make him dizzy. Pt wants to know what Dr. Hilarie Fredrickson recommends also. Please advise.

## 2020-08-16 NOTE — Telephone Encounter (Signed)
Attempted to call pt back, no answer and unable to leave message.

## 2020-08-19 NOTE — Telephone Encounter (Signed)
Spoke with pt and he is aware and knows to contact his PCP. Also states he fell a few weeks ago and hit his head, he did not go get checked out after the fall. Encouraged him to contact his PCP regarding these issues.

## 2020-08-19 NOTE — Telephone Encounter (Signed)
Agree that the Xanax may be contributing to some of the dizziness I do not think it is Amitiza related and actually when he does not have regular incomplete bowel movements he has quite a bit more "nausea" and sick feeling on the stomach I would recommend he continue with the Amitiza and discuss further dizziness with PCP as you recommended

## 2020-09-02 ENCOUNTER — Other Ambulatory Visit: Payer: Self-pay | Admitting: Family Medicine

## 2020-09-02 DIAGNOSIS — J206 Acute bronchitis due to rhinovirus: Secondary | ICD-10-CM

## 2020-09-06 ENCOUNTER — Telehealth: Payer: Self-pay

## 2020-09-06 DIAGNOSIS — J206 Acute bronchitis due to rhinovirus: Secondary | ICD-10-CM

## 2020-09-06 MED ORDER — LEVOTHYROXINE SODIUM 75 MCG PO TABS: ORAL_TABLET | ORAL | 0 refills | Status: DC

## 2020-09-06 NOTE — Telephone Encounter (Signed)
  Prescription Request  09/06/2020  What is the name of the medication or equipment? Levothyroxine  Have you contacted your pharmacy to request a refill? (if applicable) Yes  Which pharmacy would you like this sent to? [facility]  Pt spoke with someone from [facility] who told him that his Levothyroxine Rx wont be delivered until Wednesday. Pt only has enough medicine to last him until Sunday.   I spoke with Gregary Signs from [facility] who said the reason why pt has not already received his Rx is because its not on auto refill because when it was sent, no refills were included. Said if we send script with refills then we can put it on auto refill so that pt can receive in time.  Patient notified that their request is being sent to the clinical staff for review and that they should receive a response within 2 business days.

## 2020-09-06 NOTE — Telephone Encounter (Signed)
30 day supply of Levothyroxine sent to [facility].  Tried to contact patient and inform, no answer.

## 2020-09-16 DIAGNOSIS — N138 Other obstructive and reflux uropathy: Secondary | ICD-10-CM | POA: Diagnosis not present

## 2020-09-18 ENCOUNTER — Encounter: Payer: Self-pay | Admitting: Family Medicine

## 2020-09-18 ENCOUNTER — Other Ambulatory Visit: Payer: Self-pay

## 2020-09-18 ENCOUNTER — Ambulatory Visit (INDEPENDENT_AMBULATORY_CARE_PROVIDER_SITE_OTHER): Payer: Medicare Other | Admitting: Family Medicine

## 2020-09-18 VITALS — BP 123/63 | HR 88 | Temp 97.3°F | Ht 67.0 in | Wt 133.8 lb

## 2020-09-18 DIAGNOSIS — E039 Hypothyroidism, unspecified: Secondary | ICD-10-CM | POA: Diagnosis not present

## 2020-09-18 DIAGNOSIS — D518 Other vitamin B12 deficiency anemias: Secondary | ICD-10-CM | POA: Diagnosis not present

## 2020-09-18 DIAGNOSIS — E559 Vitamin D deficiency, unspecified: Secondary | ICD-10-CM | POA: Diagnosis not present

## 2020-09-18 DIAGNOSIS — R6889 Other general symptoms and signs: Secondary | ICD-10-CM | POA: Diagnosis not present

## 2020-09-18 DIAGNOSIS — F5104 Psychophysiologic insomnia: Secondary | ICD-10-CM | POA: Diagnosis not present

## 2020-09-18 DIAGNOSIS — Z23 Encounter for immunization: Secondary | ICD-10-CM | POA: Diagnosis not present

## 2020-09-18 DIAGNOSIS — J206 Acute bronchitis due to rhinovirus: Secondary | ICD-10-CM

## 2020-09-18 DIAGNOSIS — I10 Essential (primary) hypertension: Secondary | ICD-10-CM

## 2020-09-18 DIAGNOSIS — E782 Mixed hyperlipidemia: Secondary | ICD-10-CM

## 2020-09-18 DIAGNOSIS — K2100 Gastro-esophageal reflux disease with esophagitis, without bleeding: Secondary | ICD-10-CM

## 2020-09-18 DIAGNOSIS — N401 Enlarged prostate with lower urinary tract symptoms: Secondary | ICD-10-CM

## 2020-09-18 MED ORDER — QUETIAPINE FUMARATE 25 MG PO TABS: 25.0000 mg | ORAL_TABLET | Freq: Every day | ORAL | 1 refills | Status: DC

## 2020-09-18 MED ORDER — FAMOTIDINE 40 MG PO TABS: 40.0000 mg | ORAL_TABLET | Freq: Every day | ORAL | 3 refills | Status: DC

## 2020-09-18 MED ORDER — PANTOPRAZOLE SODIUM 40 MG PO TBEC: 40.0000 mg | DELAYED_RELEASE_TABLET | Freq: Every day | ORAL | 2 refills | Status: DC

## 2020-09-18 MED ORDER — DICLOFENAC SODIUM 75 MG PO TBEC
DELAYED_RELEASE_TABLET | ORAL | 2 refills | Status: DC
Start: 2020-09-18 — End: 2020-10-18

## 2020-09-18 MED ORDER — OLMESARTAN MEDOXOMIL 40 MG PO TABS: 40.0000 mg | ORAL_TABLET | Freq: Every day | ORAL | 1 refills | Status: DC

## 2020-09-18 MED ORDER — SILODOSIN 4 MG PO CAPS: 4.0000 mg | ORAL_CAPSULE | Freq: Every day | ORAL | 1 refills | Status: DC

## 2020-09-18 MED ORDER — PRAVASTATIN SODIUM 40 MG PO TABS: 40.0000 mg | ORAL_TABLET | Freq: Every evening | ORAL | 1 refills | Status: DC

## 2020-09-18 MED ORDER — LEVOTHYROXINE SODIUM 75 MCG PO TABS: ORAL_TABLET | ORAL | 3 refills | Status: DC

## 2020-09-18 NOTE — Progress Notes (Addendum)
Subjective:  Patient ID: Albert Allen, male    DOB: February 10, 1923  Age: 86 y.o. MRN: [id number]  CC: Follow-up (3 Month)   HPI Albert Allen presents for decrease energy. Concerned PPI causing low b12.  Currently he stopped orally.  He has been diagnosed with a low B12 in the past.  It will be rechecked today.  Patient is also experiencing some lower abdominal pain in the bladder area.  He has been seeing a neurologist who has not offered any explanation.  Patient says that he fell 3 months ago in his left temple.  Since that time he has some jitteriness and dizziness.  No focal neurologic changes.  At this time he says he wants to discontinue the Xanax because it makes him feel foolish.  When it wears off he feels much more alert.  Depression screen [facility] 2/9 09/18/2020 03/19/2020 12/22/2019  Decreased Interest 1 0 3  Down, Depressed, Hopeless 0 0 3  PHQ - 2 Score 1 0 6  Altered sleeping 0 - 2  Tired, decreased energy 1 - 3  Change in appetite 0 - 0  Feeling bad or failure about yourself  0 - 2  Trouble concentrating 0 - 0  Moving slowly or fidgety/restless 1 - 1  Suicidal thoughts 0 - 1  PHQ-9 Score 3 - 15  Difficult doing work/chores Not difficult at all - Somewhat difficult  Some recent data might be hidden    History Albert Allen has a past medical history of ABNORMAL CV (STRESS) TEST (03/05/2009), ABUSE, ALCOHOL, IN REMISSION (05/31/2007), Adhesive capsulitis of shoulder (0/06/6760), Alcoholic polyneuropathy ([place]) (03/29/2008), ANEMIA, B12 DEFICIENCY (03/29/2008), Anxiety state, unspecified (11/19/2008), ASTHMA (05/31/2007), Asthma, CAD, NATIVE VESSEL (04/04/2009), CARPAL TUNNEL SYNDROME, RIGHT (09/05/2007), Cataract, Chronic prostatitis (03/29/2008), COPD (chronic obstructive pulmonary disease) ([place]), Crohn's disease ([place]), Degenerative disc disease, cervical, DEGENERATIVE DISC DISEASE, CERVICAL SPINE (06/22/2007), Diverticulosis, DYSPHAGIA UNSPECIFIED (09/25/2008), Esophageal stricture, External  hemorrhoids, GERD (05/31/2007), GOUT (05/31/2007), Hepatomegaly (09/25/2008), Hiatal hernia, HIP PAIN (03/20/2010), History of echocardiogram, HYPERLIPIDEMIA (03/27/2009), HYPERTENSION (05/31/2007), HYPOTHYROIDISM (05/31/2007), LEFT BUNDLE BRANCH BLOCK (02/11/2009), Liver hemangioma, Liver mass, left lobe, LOC OSTEOARTHROS NOT SPEC PRIM/SEC LOWER LEG (03/20/2010), OSTEOARTHRITIS (05/31/2007), PERSONAL HX COLONIC POLYPS (03/05/1997), PSA, INCREASED (03/20/2010), SINUSITIS, CHRONIC NOS (05/31/2007), Sore throat, SYNCOPE (11/19/2008), and WEIGHT LOSS, RECENT (11/19/2008).   He has a past surgical history that includes pul. colapse w/chest tube; Tonsillectomy; carpel tunnel release; ORIF ulnar fracture (11/03/2012); Eye surgery; Inguinal hernia repair (12/18/2015); Inguinal hernia repair (Right, 12/18/2015); Insertion of mesh (Right, 12/18/2015); and Cardiac catheterization.   His family history includes Alcohol abuse in his father; Cancer in his brother and mother; Diabetes in his brother, sister, and sister; Lung disease in his father.He reports that he quit smoking about 22 years ago. His smoking use included cigarettes. He has never used smokeless tobacco. He reports current alcohol use of about 5.0 standard drinks of alcohol per week. He reports that he does not use drugs.    ROS Review of Systems  Constitutional: Negative for fever.  Respiratory: Negative for shortness of breath.   Cardiovascular: Negative for chest pain.  Musculoskeletal: Negative for arthralgias.  Skin: Negative for rash.    Objective:  BP 123/63   Pulse 88   Temp (!) 97.3 F (36.3 C) (Temporal)   Ht $R'5\' 7"'NA$  (1.702 m)   Wt 133 lb 12.8 oz (60.7 kg)   BMI 20.96 kg/m   BP Readings from Last 3 Encounters:  09/18/20 123/63  03/27/20 110/63  03/19/20 131/66  Wt Readings from Last 3 Encounters:  09/18/20 133 lb 12.8 oz (60.7 kg)  03/27/20 131 lb (59.4 kg)  03/19/20 133 lb (60.3 kg)     Physical Exam Vitals reviewed.  Constitutional:       Appearance: He is well-developed.  HENT:     Head: Normocephalic and atraumatic.     Right Ear: Tympanic membrane and external ear normal. No decreased hearing noted.     Left Ear: Tympanic membrane and external ear normal. No decreased hearing noted.     Mouth/Throat:     Pharynx: No oropharyngeal exudate or posterior oropharyngeal erythema.  Eyes:     Pupils: Pupils are equal, round, and reactive to light.  Cardiovascular:     Rate and Rhythm: Normal rate and regular rhythm.     Heart sounds: No murmur heard.   Pulmonary:     Effort: No respiratory distress.     Breath sounds: Normal breath sounds.  Abdominal:     General: Bowel sounds are normal.     Palpations: Abdomen is soft. There is no mass.     Tenderness: There is no abdominal tenderness.  Musculoskeletal:     Cervical back: Normal range of motion and neck supple.       Assessment & Plan:   Albert Allen was seen today for follow-up.  Diagnoses and all orders for this visit:  Hypothyroidism, unspecified type -     CBC with Differential/Platelet -     CMP14+EGFR -     TSH + free T4 -     levothyroxine (SYNTHROID) 75 MCG tablet; TAKE 1 TABLET BY MOUTH  DAILY BEFORE BREAKFAST  Need for immunization against influenza -     Flu Vaccine QUAD High Dose(Fluad) -     CBC with Differential/Platelet -     CMP14+EGFR  Psychophysiological insomnia -     CBC with Differential/Platelet -     CMP14+EGFR  Mixed hyperlipidemia -     CBC with Differential/Platelet -     CMP14+EGFR -     Lipid panel -     pravastatin (PRAVACHOL) 40 MG tablet; Take 1 tablet (40 mg total) by mouth every evening.  ANEMIA, B12 DEFICIENCY -     CBC with Differential/Platelet -     CMP14+EGFR -     Vitamin B12  Essential hypertension -     CBC with Differential/Platelet -     CMP14+EGFR -     olmesartan (BENICAR) 40 MG tablet; Take 1 tablet (40 mg total) by mouth daily.  Gastroesophageal reflux disease with esophagitis without  hemorrhage -     CBC with Differential/Platelet -     CMP14+EGFR -     famotidine (PEPCID) 40 MG tablet; Take 1 tablet (40 mg total) by mouth at bedtime. -     pantoprazole (PROTONIX) 40 MG tablet; Take 1 tablet (40 mg total) by mouth daily.  Benign prostatic hyperplasia with urinary frequency -     CBC with Differential/Platelet -     CMP14+EGFR -     silodosin (RAPAFLO) 4 MG CAPS capsule; Take 1 capsule (4 mg total) by mouth daily.  Vitamin D deficiency -     VITAMIN D 25 Hydroxy (Vit-D Deficiency, Fractures)  Acute bronchitis due to Rhinovirus -     levothyroxine (SYNTHROID) 75 MCG tablet; TAKE 1 TABLET BY MOUTH  DAILY BEFORE BREAKFAST  Other orders -     diclofenac (VOLTAREN) 75 MG EC tablet; TAKE 1 TABLET TWICE DAILY AS NEEDED FOR  JOINT OR MUSCLE PAIN -     QUEtiapine (SEROQUEL) 25 MG tablet; Take 1 tablet (25 mg total) by mouth at bedtime. Helps with sleep and nerves       I have discontinued Coralyn Pear. Radke's montelukast and ALPRAZolam. I have also changed his olmesartan, pravastatin, and silodosin. Additionally, I am having him start on QUEtiapine. Lastly, I am having him maintain his aspirin EC, Cyanocobalamin (VITAMIN B-12 PO), PreserVision AREDS, albuterol, lubiprostone, diclofenac, famotidine, levothyroxine, and pantoprazole.  Allergies as of 09/18/2020      Reactions   Amoxicillin Swelling, Other (See Comments)   Pt has swelling with PCN's   Penicillins Swelling   Lips swelling Has patient had a PCN reaction causing immediate rash, facial/tongue/throat swelling, SOB or lightheadedness with hypotension: No Has patient had a PCN reaction causing severe rash involving mucus membranes or skin necrosis: No Has patient had a PCN reaction that required hospitalization No Has patient had a PCN reaction occurring within the last 10 years: No If all of the above answers are "NO", then may proceed with Cephalosporin use.   Ceclor [cefaclor] Other (See Comments)   Pt does  not remember reaction   Celebrex [celecoxib] Other (See Comments)   Pt does not remember reaction   Levaquin [levofloxacin In D5w] Other (See Comments)   Pt does not remember reaction      Medication List       Accurate as of September 18, 2020  5:41 PM. If you have any questions, ask your nurse or doctor.        STOP taking these medications   ALPRAZolam 0.5 MG tablet Commonly known as: XANAX Stopped by: [name], MD   montelukast 10 MG tablet Commonly known as: SINGULAIR Stopped by: [name], MD     TAKE these medications   albuterol 108 (90 Base) MCG/ACT inhaler Commonly known as: VENTOLIN HFA Inhale 2 puffs into the lungs every 4 (four) hours as needed for wheezing or shortness of breath.   aspirin EC 81 MG tablet Take 81 mg by mouth daily.   diclofenac 75 MG EC tablet Commonly known as: VOLTAREN TAKE 1 TABLET TWICE DAILY AS NEEDED FOR JOINT OR MUSCLE PAIN   famotidine 40 MG tablet Commonly known as: Pepcid Take 1 tablet (40 mg total) by mouth at bedtime.   levothyroxine 75 MCG tablet Commonly known as: SYNTHROID TAKE 1 TABLET BY MOUTH  DAILY BEFORE BREAKFAST   lubiprostone 8 MCG capsule Commonly known as: Amitiza TAKE (1) CAPSULE TWICE DAILY WITH MEALS.   olmesartan 40 MG tablet Commonly known as: BENICAR Take 1 tablet (40 mg total) by mouth daily.   pantoprazole 40 MG tablet Commonly known as: PROTONIX Take 1 tablet (40 mg total) by mouth daily.   pravastatin 40 MG tablet Commonly known as: PRAVACHOL Take 1 tablet (40 mg total) by mouth every evening.   PreserVision AREDS Caps Take by mouth.   QUEtiapine 25 MG tablet Commonly known as: SEROQUEL Take 1 tablet (25 mg total) by mouth at bedtime. Helps with sleep and nerves Started by: [name], MD   silodosin 4 MG Caps capsule Commonly known as: RAPAFLO Take 1 capsule (4 mg total) by mouth daily.   VITAMIN B-12 PO Take by mouth.        Follow-up: Return in about 3 months  (around 12/19/2020).  [name], M.D.

## 2020-09-18 NOTE — Addendum Note (Signed)
Addended by: [name] on: 09/18/2020 05:41 PM   Modules accepted: Orders

## 2020-09-19 LAB — CBC WITH DIFFERENTIAL/PLATELET
Basophils Absolute: 0 10*3/uL (ref 0.0–0.2)
Basos: 0 %
EOS (ABSOLUTE): 0 10*3/uL (ref 0.0–0.4)
Eos: 1 %
Hematocrit: 29.9 % — ABNORMAL LOW (ref 37.5–51.0)
Hemoglobin: 10 g/dL — ABNORMAL LOW (ref 13.0–17.7)
Immature Grans (Abs): 0.1 10*3/uL (ref 0.0–0.1)
Immature Granulocytes: 2 %
Lymphocytes Absolute: 0.7 10*3/uL (ref 0.7–3.1)
Lymphs: 12 %
MCH: 36.1 pg — ABNORMAL HIGH (ref 26.6–33.0)
MCHC: 33.4 g/dL (ref 31.5–35.7)
MCV: 108 fL — ABNORMAL HIGH (ref 79–97)
Monocytes Absolute: 1.4 10*3/uL — ABNORMAL HIGH (ref 0.1–0.9)
Monocytes: 24 %
Neutrophils Absolute: 3.6 10*3/uL (ref 1.4–7.0)
Neutrophils: 61 %
Platelets: 119 10*3/uL — ABNORMAL LOW (ref 150–450)
RBC: 2.77 x10E6/uL — ABNORMAL LOW (ref 4.14–5.80)
RDW: 13.3 % (ref 11.6–15.4)
WBC: 5.8 10*3/uL (ref 3.4–10.8)

## 2020-09-19 LAB — VITAMIN B12: Vitamin B-12: 376 pg/mL (ref 232–1245)

## 2020-09-19 LAB — CMP14+EGFR
ALT: 7 IU/L (ref 0–44)
AST: 10 IU/L (ref 0–40)
Albumin/Globulin Ratio: 2.2 (ref 1.2–2.2)
Albumin: 4 g/dL (ref 3.5–4.6)
Alkaline Phosphatase: 69 IU/L (ref 44–121)
BUN/Creatinine Ratio: 13 (ref 10–24)
BUN: 16 mg/dL (ref 10–36)
Bilirubin Total: 0.4 mg/dL (ref 0.0–1.2)
CO2: 23 mmol/L (ref 20–29)
Calcium: 8.7 mg/dL (ref 8.6–10.2)
Chloride: 102 mmol/L (ref 96–106)
Creatinine, Ser: 1.2 mg/dL (ref 0.76–1.27)
GFR calc Af Amer: 59 mL/min/{1.73_m2} — ABNORMAL LOW (ref >59)
GFR calc non Af Amer: 51 mL/min/{1.73_m2} — ABNORMAL LOW (ref >59)
Globulin, Total: 1.8 g/dL (ref 1.5–4.5)
Glucose: 117 mg/dL — ABNORMAL HIGH (ref 65–99)
Potassium: 4.1 mmol/L (ref 3.5–5.2)
Sodium: 137 mmol/L (ref 134–144)
Total Protein: 5.8 g/dL — ABNORMAL LOW (ref 6.0–8.5)

## 2020-09-19 LAB — TSH+FREE T4
Free T4: 1.4 ng/dL (ref 0.82–1.77)
TSH: 3.47 u[IU]/mL (ref 0.450–4.500)

## 2020-09-19 LAB — LIPID PANEL
Chol/HDL Ratio: 2.6 ratio (ref 0.0–5.0)
Cholesterol, Total: 120 mg/dL (ref 100–199)
HDL: 46 mg/dL (ref >39)
LDL Chol Calc (NIH): 51 mg/dL (ref 0–99)
Triglycerides: 134 mg/dL (ref 0–149)
VLDL Cholesterol Cal: 23 mg/dL (ref 5–40)

## 2020-09-19 LAB — VITAMIN D 25 HYDROXY (VIT D DEFICIENCY, FRACTURES): Vit D, 25-Hydroxy: 27 ng/mL — ABNORMAL LOW (ref 30.0–100.0)

## 2020-09-23 ENCOUNTER — Other Ambulatory Visit: Payer: Self-pay

## 2020-09-23 DIAGNOSIS — D619 Aplastic anemia, unspecified: Secondary | ICD-10-CM

## 2020-09-26 LAB — FERRITIN: Ferritin: 73 ng/mL (ref 30–400)

## 2020-09-26 LAB — FOLATE: Folate: 9.5 ng/mL (ref >3.0)

## 2020-09-26 LAB — SPECIMEN STATUS REPORT

## 2020-09-26 LAB — IRON: Iron: 40 ug/dL (ref 38–169)

## 2020-10-16 ENCOUNTER — Telehealth: Payer: Self-pay

## 2020-10-16 ENCOUNTER — Other Ambulatory Visit: Payer: Self-pay | Admitting: Family Medicine

## 2020-10-16 DIAGNOSIS — F411 Generalized anxiety disorder: Secondary | ICD-10-CM

## 2020-10-16 MED ORDER — ALPRAZOLAM 0.5 MG PO TABS: 0.5000 mg | ORAL_TABLET | Freq: Every day | ORAL | 4 refills | Status: DC

## 2020-10-16 NOTE — Telephone Encounter (Signed)
PATIENT AWARE

## 2020-10-16 NOTE — Telephone Encounter (Signed)
Call C# [phone] if you don't get him on H#

## 2020-10-16 NOTE — Telephone Encounter (Signed)
Okay to sto the quetiapine (seroquel. ) I will send in the xanax.

## 2020-10-18 ENCOUNTER — Other Ambulatory Visit: Payer: Self-pay | Admitting: Family Medicine

## 2020-10-18 DIAGNOSIS — E782 Mixed hyperlipidemia: Secondary | ICD-10-CM

## 2020-10-18 DIAGNOSIS — J206 Acute bronchitis due to rhinovirus: Secondary | ICD-10-CM

## 2020-10-18 DIAGNOSIS — E039 Hypothyroidism, unspecified: Secondary | ICD-10-CM

## 2020-10-18 MED ORDER — DICLOFENAC SODIUM 75 MG PO TBEC
DELAYED_RELEASE_TABLET | ORAL | 0 refills | Status: DC
Start: 2020-10-18 — End: 2020-11-21

## 2020-10-18 MED ORDER — PRAVASTATIN SODIUM 40 MG PO TABS: 40.0000 mg | ORAL_TABLET | Freq: Every evening | ORAL | 0 refills | Status: DC

## 2020-10-18 MED ORDER — ALBUTEROL SULFATE HFA 108 (90 BASE) MCG/ACT IN AERS: 2.0000 | INHALATION_SPRAY | RESPIRATORY_TRACT | 0 refills | Status: DC | PRN

## 2020-10-18 NOTE — Addendum Note (Signed)
Addended by: Antonietta Barcelona D on: 10/18/2020 04:01 PM   Modules accepted: Orders

## 2020-10-21 ENCOUNTER — Ambulatory Visit: Payer: Medicare Other | Admitting: Cardiovascular Disease

## 2020-10-21 ENCOUNTER — Encounter: Payer: Self-pay | Admitting: Cardiovascular Disease

## 2020-10-21 ENCOUNTER — Other Ambulatory Visit: Payer: Self-pay

## 2020-10-21 VITALS — BP 136/60 | HR 78 | Ht 67.0 in | Wt 130.8 lb

## 2020-10-21 DIAGNOSIS — I2510 Atherosclerotic heart disease of native coronary artery without angina pectoris: Secondary | ICD-10-CM | POA: Diagnosis not present

## 2020-10-21 DIAGNOSIS — I10 Essential (primary) hypertension: Secondary | ICD-10-CM | POA: Diagnosis not present

## 2020-10-21 NOTE — Patient Instructions (Signed)

## 2020-10-21 NOTE — Progress Notes (Signed)
Chief Complaint  Patient presents with  . Follow-up    CAD   History of Present Illness: 86 yo male with past medical history of CAD, HTN, anemia, asthma/COPD, GERD with hiatal hernia, alcohol abuse in remission and hypothyroidism here today for cardiac follow up. Cardiac cath April 2010 showed a 70-80% heavily calcified stenosis in the moderate caliber mid LAD. The most diseased segment was in the mid LAD just before the takeoff of a large diagonal branch. The Diagonal branch was larger than the LAD although the LAD did course to the apex. The LAD was not felt to be be a good vessel for PCI. There was diffuse mildly obstructive disease in the other vessels. We elected for medical management of his CAD and he has done well since then. Vascular screening at [facility] October 2015 with mild bilateral carotid artery disease, normal ABI.  Echo 03/18/16 with normal LV function, mild LVH, trivial AI, mild MR. He has had atypical chest pain for years which has been felt to be related to GERD and anxiety. He has had vascular screening In October 2020 which showed mild carotid artery disease, normal ABI, no AAA.   He is here today for follow up. The patient denies any chest pain, dyspnea, palpitations, lower extremity edema, orthopnea, PND, dizziness, near syncope or syncope.    Primary Care Physician: [name], MD  Past Medical History:  Diagnosis Date  . ABNORMAL CV (STRESS) TEST 03/05/2009  . ABUSE, ALCOHOL, IN REMISSION 05/31/2007  . Adhesive capsulitis of shoulder 12/29/2007  . Alcoholic polyneuropathy ([place]) 03/29/2008  . ANEMIA, B12 DEFICIENCY 03/29/2008  . Anxiety state, unspecified 11/19/2008  . ASTHMA 05/31/2007  . Asthma   . CAD, NATIVE VESSEL 04/04/2009  . CARPAL TUNNEL SYNDROME, RIGHT 09/05/2007  . Cataract    removed bilaterally  . Chronic prostatitis 03/29/2008  . COPD (chronic obstructive pulmonary disease) ([place])    "they told me I have a touch of COPD"  . Crohn's disease ([place])    . Degenerative disc disease, cervical   . DEGENERATIVE DISC DISEASE, CERVICAL SPINE 06/22/2007  . Diverticulosis   . DYSPHAGIA UNSPECIFIED 09/25/2008  . Esophageal stricture   . External hemorrhoids   . GERD 05/31/2007  . GOUT 05/31/2007  . Hepatomegaly 09/25/2008  . Hiatal hernia   . HIP PAIN 03/20/2010  . History of echocardiogram    Echo 4/17 - mild LVH, EF 50-55%, Gr 1 DD, trivial AI, MAC, mild MR  . HYPERLIPIDEMIA 03/27/2009  . HYPERTENSION 05/31/2007  . HYPOTHYROIDISM 05/31/2007  . LEFT BUNDLE BRANCH BLOCK 02/11/2009  . Liver hemangioma   . Liver mass, left lobe   . LOC OSTEOARTHROS NOT SPEC PRIM/SEC LOWER LEG 03/20/2010  . OSTEOARTHRITIS 05/31/2007  . PERSONAL HX COLONIC POLYPS 03/05/1997   adenomatous   . PSA, INCREASED 03/20/2010  . SINUSITIS, CHRONIC NOS 05/31/2007  . Sore throat    CURRENTLY  . SYNCOPE 11/19/2008  . WEIGHT LOSS, RECENT 11/19/2008    Past Surgical History:  Procedure Laterality Date  . CARDIAC CATHETERIZATION    . carpel tunnel release    . EYE SURGERY    . INGUINAL HERNIA REPAIR  12/18/2015  . INGUINAL HERNIA REPAIR Right 12/18/2015   Procedure: OPEN REPAIR RIGHT INGUINAL HERNIA ;  Surgeon: Greer Pickerel, MD;  Location: [facility];  Service: General;  Laterality: Right;  . INSERTION OF MESH Right 12/18/2015   Procedure: INSERTION OF MESH;  Surgeon: Greer Pickerel, MD;  Location: [facility];  Service: General;  Laterality: Right;  . ORIF ULNAR FRACTURE  11/03/2012   Procedure: OPEN REDUCTION INTERNAL FIXATION (ORIF) ULNAR FRACTURE;  Surgeon: Hessie Dibble, MD;  Location: [facility];  Service: Orthopedics;  Laterality: Right;  ORIF radius and ulnar fractures  . pul. colapse w/chest tube    . TONSILLECTOMY      Current Outpatient Medications  Medication Sig Dispense Refill  . albuterol (VENTOLIN HFA) 108 (90 Base) MCG/ACT inhaler Inhale 2 puffs into the lungs every 4 (four) hours as needed for wheezing or shortness of breath. 54 g 0  . ALPRAZolam (XANAX) 0.5 MG tablet Take 1  tablet (0.5 mg total) by mouth at bedtime. 30 tablet 4  . aspirin EC 81 MG tablet Take 81 mg by mouth daily.    . Cyanocobalamin (VITAMIN B-12 PO) Take by mouth.    . diclofenac (VOLTAREN) 75 MG EC tablet TAKE 1 TABLET TWICE DAILY AS NEEDED FOR JOINT OR MUSCLE PAIN 180 tablet 0  . famotidine (PEPCID) 40 MG tablet Take 1 tablet (40 mg total) by mouth at bedtime. 90 tablet 3  . levothyroxine (SYNTHROID) 75 MCG tablet TAKE 1 TABLET BY MOUTH  DAILY BEFORE BREAKFAST 90 tablet 3  . lubiprostone (AMITIZA) 8 MCG capsule TAKE (1) CAPSULE TWICE DAILY WITH MEALS. 60 capsule 3  . Multiple Vitamins-Minerals (PRESERVISION AREDS) CAPS Take by mouth.    . olmesartan (BENICAR) 40 MG tablet Take 1 tablet (40 mg total) by mouth daily. 90 tablet 1  . pantoprazole (PROTONIX) 40 MG tablet Take 1 tablet (40 mg total) by mouth daily. 90 tablet 2  . pravastatin (PRAVACHOL) 40 MG tablet Take 1 tablet (40 mg total) by mouth every evening. 90 tablet 0  . silodosin (RAPAFLO) 4 MG CAPS capsule Take 1 capsule (4 mg total) by mouth daily. 90 capsule 1   No current facility-administered medications for this visit.    Allergies  Allergen Reactions  . Amoxicillin Swelling and Other (See Comments)    Pt has swelling with PCN's  . Penicillins Swelling    Lips swelling Has patient had a PCN reaction causing immediate rash, facial/tongue/throat swelling, SOB or lightheadedness with hypotension: No Has patient had a PCN reaction causing severe rash involving mucus membranes or skin necrosis: No Has patient had a PCN reaction that required hospitalization No Has patient had a PCN reaction occurring within the last 10 years: No If all of the above answers are "NO", then may proceed with Cephalosporin use.  Blair Dolphin [Cefaclor] Other (See Comments)    Pt does not remember reaction  . Celebrex [Celecoxib] Other (See Comments)    Pt does not remember reaction  . Levaquin [Levofloxacin In D5w] Other (See Comments)    Pt does not  remember reaction    Social History   Socioeconomic History  . Marital status: Married    Spouse name: Not on file  . Number of children: 2  . Years of education: 9  . Highest education level: 9th grade  Occupational History  . Occupation: retired    Comment: Oncologist  Tobacco Use  . Smoking status: Former Smoker    Types: Cigarettes    Quit date: 04/24/1998    Years since quitting: 22.5  . Smokeless tobacco: Never Used  Vaping Use  . Vaping Use: Never used  Substance and Sexual Activity  . Alcohol use: Yes    Alcohol/week: 5.0 standard drinks    Types: 5 Shots of liquor per week  Comment: Does not drink weekly but does buy a pint of liqour occasionally and will drink most of the bottle when he does  . Drug use: No  . Sexual activity: Not Currently  Other Topics Concern  . Not on file  Social History Narrative  . Not on file   Social Determinants of Health   Financial Resource Strain:   . Difficulty of Paying Living Expenses: Not on file  Food Insecurity:   . Worried About Charity fundraiser in the Last Year: Not on file  . Ran Out of Food in the Last Year: Not on file  Transportation Needs:   . Lack of Transportation (Medical): Not on file  . Lack of Transportation (Non-Medical): Not on file  Physical Activity:   . Days of Exercise per Week: Not on file  . Minutes of Exercise per Session: Not on file  Stress:   . Feeling of Stress : Not on file  Social Connections:   . Frequency of Communication with Friends and Family: Not on file  . Frequency of Social Gatherings with Friends and Family: Not on file  . Attends Religious Services: Not on file  . Active Member of Clubs or Organizations: Not on file  . Attends Archivist Meetings: Not on file  . Marital Status: Not on file  Intimate Partner Violence:   . Fear of Current or Ex-Partner: Not on file  . Emotionally Abused: Not on file  . Physically Abused: Not on file  . Sexually Abused: Not  on file    Family History  Problem Relation Age of Onset  . Cancer Mother        In back but unsure of what type of cancer  . Alcohol abuse Father   . Lung disease Father        abscess  . Diabetes Sister   . Cancer Brother        throat  . Diabetes Brother   . Diabetes Sister   . Colon cancer Neg Hx   . Colon polyps Neg Hx   . Esophageal cancer Neg Hx   . Rectal cancer Neg Hx   . Stomach cancer Neg Hx     Review of Systems:  As stated in the HPI and otherwise negative.   BP 136/60   Pulse 78   Ht _0  (1.702 m)   Wt 130 lb 12.8 oz (59.3 kg)   SpO2 96%   BMI 20.49 kg/m   Physical Examination:  General: Well developed, well nourished, NAD  HEENT: OP clear, mucus membranes moist  SKIN: warm, dry. No rashes. Neuro: No focal deficits  Musculoskeletal: Muscle strength 5/5 all ext  Psychiatric: Mood and affect normal  Neck: No JVD, no carotid bruits, no thyromegaly, no lymphadenopathy.  Lungs:Clear bilaterally, no wheezes, rhonci, crackles Cardiovascular: Regular rate and rhythm. No murmurs, gallops or rubs. Abdomen:Soft. Bowel sounds present. Non-tender.  Extremities: No lower extremity edema. Pulses are 2 + in the bilateral DP/PT.  Echo 03/18/16: Left ventricle: Abnormal septal motion The cavity size was mildly   dilated. Wall thickness was increased in a pattern of mild LVH.   Systolic function was normal. The estimated ejection fraction was   in the range of 50% to 55%. Doppler parameters are consistent   with abnormal left ventricular relaxation (grade 1 diastolic   dysfunction). - Aortic valve: There was trivial regurgitation. - Mitral valve: Calcified annulus. Mildly thickened leaflets .   There was mild regurgitation. -  Atrial septum: No defect or patent foramen ovale was identified.  EKG:  EKG is not ordered today. The ekg ordered today demonstrates   Recent Labs: 09/18/2020: ALT 7; BUN 16; Creatinine, Ser 1.20; Hemoglobin 10.0; Platelets 119;  Potassium 4.1; Sodium 137; TSH 3.470   Lipid Panel    Component Value Date/Time   CHOL 120 09/18/2020 0942   CHOL 177 05/01/2013 1059   TRIG 134 09/18/2020 0942   TRIG 196 (H) 05/01/2013 1059   HDL 46 09/18/2020 0942   HDL 45 05/01/2013 1059   CHOLHDL 2.6 09/18/2020 0942   CHOLHDL 4 09/22/2012 1051   VLDL 35.2 09/22/2012 1051   LDLCALC 51 09/18/2020 0942   LDLCALC 93 05/01/2013 1059   LDLDIRECT 67.7 01/12/2012 0823     Wt Readings from Last 3 Encounters:  10/21/20 130 lb 12.8 oz (59.3 kg)  09/18/20 133 lb 12.8 oz (60.7 kg)  03/27/20 131 lb (59.4 kg)     Other studies Reviewed: Additional studies/ records that were reviewed today include: . Review of the above records demonstrates:    Assessment and Plan:   1. CAD with stable angina: He is known to have severe, heavily calcified disease in his mid LAD by cath in 2010. The LAD was not felt to be favorable for PCI. He has done well with medical therapy of his CAD since 2010. He has occasional chest pains which have not changed in frequency or severity. He has not been on a beta blocker due to bradycardia. He has NTG to use as needed for chest pain. Will continue ASA and statin.   2. HTN:  BP is well controlled. Continue current therapy  3. LBBB: transient. Noted on EKG in 2016 and 2019 but not present on EKG in 2017 or 2018.   Current medicines are reviewed at length with the patient today.  The patient does not have concerns regarding medicines.  The following changes have been made:  no change  Labs/ tests ordered today include:   No orders of the defined types were placed in this encounter.   Disposition:   Follow up with me in 12 months.    Signed, Lauree Chandler, MD 10/21/2020 9:43 AM    [facility] [place], [place], [place]  [address] Phone: [phone]; Fax: [phone]

## 2020-10-23 ENCOUNTER — Telehealth: Payer: Self-pay

## 2020-10-23 NOTE — Telephone Encounter (Signed)
Form placed back on providers desk

## 2020-11-01 NOTE — Telephone Encounter (Signed)
Form received back from provider and faxed Dr. Livia Snellen marked, I will submit a PA

## 2020-11-05 ENCOUNTER — Other Ambulatory Visit: Payer: Self-pay | Admitting: Family Medicine

## 2020-11-05 MED ORDER — PROAIR RESPICLICK 108 (90 BASE) MCG/ACT IN AEPB: 2.0000 | INHALATION_SPRAY | Freq: Four times a day (QID) | RESPIRATORY_TRACT | 11 refills | Status: AC | PRN

## 2020-11-05 NOTE — Telephone Encounter (Signed)
PA for ventolin HFA 108-is non preferred  The following alternatives are the preferred alternatives: Albuterol HFA (Proair) or albuterol HFA (Proventil) or Proair HFA or Proair Respiclick. Would you like to switch to the provided preferred alternatives?  KeyRaelyn Mora -   PA Case ID: [id number]

## 2020-11-05 NOTE — Telephone Encounter (Signed)
Please let the patient know that I sent their prescription to their pharmacy. Thanks, WS 

## 2020-11-05 NOTE — Telephone Encounter (Signed)
Patient aware and verbalizes understanding. 

## 2020-11-20 ENCOUNTER — Other Ambulatory Visit: Payer: Self-pay | Admitting: Family Medicine

## 2020-12-13 ENCOUNTER — Telehealth: Payer: Self-pay | Admitting: *Deleted

## 2020-12-13 NOTE — Telephone Encounter (Signed)
Per pharmacy, patient's insurance no longer covers Amitiza, however, the will cover Linzess. Please advise...  Of note: patient has previously tried and failed Miralax

## 2020-12-13 NOTE — Telephone Encounter (Signed)
Can try linzess 72 mcg daily (once daily) to replace [employer] he has not tried this before [employer]

## 2020-12-17 MED ORDER — LINACLOTIDE 72 MCG PO CAPS: 72.0000 ug | ORAL_CAPSULE | Freq: Every day | ORAL | 2 refills | Status: DC

## 2020-12-17 NOTE — Telephone Encounter (Signed)
I have spoken to patient to advise that per insurance, they will no longer cover Amitza. They will cover Linzess. We will try him on Linzess and he should let us know if this is ineffective for him. He verbalizes understanding.

## 2020-12-20 ENCOUNTER — Other Ambulatory Visit: Payer: Self-pay | Admitting: Internal Medicine

## 2020-12-20 DIAGNOSIS — K2100 Gastro-esophageal reflux disease with esophagitis, without bleeding: Secondary | ICD-10-CM

## 2020-12-23 ENCOUNTER — Ambulatory Visit: Payer: Medicare Other | Admitting: Family Medicine

## 2021-01-03 NOTE — Telephone Encounter (Signed)
Patient calling to give update on medication. He said the medication is doing as good as the Amitza or better.

## 2021-01-03 NOTE — Telephone Encounter (Signed)
Noted  

## 2021-01-07 ENCOUNTER — Encounter: Payer: Self-pay | Admitting: Family Medicine

## 2021-01-07 ENCOUNTER — Ambulatory Visit (INDEPENDENT_AMBULATORY_CARE_PROVIDER_SITE_OTHER): Payer: Medicare Other | Admitting: Family Medicine

## 2021-01-07 ENCOUNTER — Other Ambulatory Visit: Payer: Self-pay

## 2021-01-07 VITALS — BP 109/60 | HR 82 | Temp 97.2°F | Ht 67.0 in | Wt 129.8 lb

## 2021-01-07 DIAGNOSIS — R972 Elevated prostate specific antigen [PSA]: Secondary | ICD-10-CM

## 2021-01-07 DIAGNOSIS — G8929 Other chronic pain: Secondary | ICD-10-CM

## 2021-01-07 DIAGNOSIS — D518 Other vitamin B12 deficiency anemias: Secondary | ICD-10-CM

## 2021-01-07 DIAGNOSIS — M545 Low back pain, unspecified: Secondary | ICD-10-CM | POA: Diagnosis not present

## 2021-01-07 DIAGNOSIS — I10 Essential (primary) hypertension: Secondary | ICD-10-CM | POA: Diagnosis not present

## 2021-01-07 DIAGNOSIS — E782 Mixed hyperlipidemia: Secondary | ICD-10-CM | POA: Diagnosis not present

## 2021-01-07 DIAGNOSIS — E039 Hypothyroidism, unspecified: Secondary | ICD-10-CM

## 2021-01-07 DIAGNOSIS — E559 Vitamin D deficiency, unspecified: Secondary | ICD-10-CM | POA: Diagnosis not present

## 2021-01-07 DIAGNOSIS — K449 Diaphragmatic hernia without obstruction or gangrene: Secondary | ICD-10-CM | POA: Diagnosis not present

## 2021-01-07 NOTE — Progress Notes (Signed)
 Subjective:  Patient ID: Albert Allen, male    DOB: 03/31/1923  Age: 87 y.o. MRN: [id number]  CC: Medical Management of Chronic Issues (3 month/)   HPI Albert Allen presents for  follow-up of hypertension. Patient has no history of headache chest pain or shortness of breath or recent cough. Patient also denies symptoms of TIA such as focal numbness or weakness. Patient denies side effects from medication. States taking it regularly.   follow-up on  thyroid. The patient has a history of hypothyroidism for many years. It has been stable recently. Pt. denies any change in  voice, loss of hair, heat or cold intolerance. Energy level has been adequate to good. Patient denies constipation and diarrhea. No myxedema. Medication is as noted below. Verified that pt is taking it daily on an empty stomach. Well tolerated.  Lots of AM nausea. Pain is low in the abdomen with it. Relates it to urinary retention and prostate. States urologist said he was fine, but he still feels like he's dying due to AM nausea. Says sometimes hefeels better if he can empty his bladder, but sometimes he is unable and he nausea is bad.  He also is having some back pain due to known diagnosis of degenerative disc disease.  He requests a shot.  He reports a grinding sensation and food getting lodged from his hiatal hernia. History Albert Allen has a past medical history of ABNORMAL CV (STRESS) TEST (03/05/2009), ABUSE, ALCOHOL, IN REMISSION (05/31/2007), Adhesive capsulitis of shoulder (12/29/2007), Alcoholic polyneuropathy ([facility]) (03/29/2008), ANEMIA, B12 DEFICIENCY (03/29/2008), Anxiety state, unspecified (11/19/2008), ASTHMA (05/31/2007), Asthma, CAD, NATIVE VESSEL (04/04/2009), CARPAL TUNNEL SYNDROME, RIGHT (09/05/2007), Cataract, Chronic prostatitis (03/29/2008), COPD (chronic obstructive pulmonary disease) ([facility]), Crohn's disease ([facility]), Degenerative disc disease, cervical, DEGENERATIVE DISC DISEASE, CERVICAL SPINE (06/22/2007),  Diverticulosis, DYSPHAGIA UNSPECIFIED (09/25/2008), Esophageal stricture, External hemorrhoids, GERD (05/31/2007), GOUT (05/31/2007), Hepatomegaly (09/25/2008), Hiatal hernia, HIP PAIN (03/20/2010), History of echocardiogram, HYPERLIPIDEMIA (03/27/2009), HYPERTENSION (05/31/2007), HYPOTHYROIDISM (05/31/2007), LEFT BUNDLE BRANCH BLOCK (02/11/2009), Liver hemangioma, Liver mass, left lobe, LOC OSTEOARTHROS NOT SPEC PRIM/SEC LOWER LEG (03/20/2010), OSTEOARTHRITIS (05/31/2007), PERSONAL HX COLONIC POLYPS (03/05/1997), PSA, INCREASED (03/20/2010), SINUSITIS, CHRONIC NOS (05/31/2007), Sore throat, SYNCOPE (11/19/2008), and WEIGHT LOSS, RECENT (11/19/2008).   He has a past surgical history that includes pul. colapse w/chest tube; Tonsillectomy; carpel tunnel release; ORIF ulnar fracture (11/03/2012); Eye surgery; Inguinal hernia repair (12/18/2015); Inguinal hernia repair (Right, 12/18/2015); Insertion of mesh (Right, 12/18/2015); and Cardiac catheterization.   His family history includes Alcohol abuse in his father; Cancer in his brother and mother; Diabetes in his brother, sister, and sister; Lung disease in his father.He reports that he quit smoking about 22 years ago. His smoking use included cigarettes. He has never used smokeless tobacco. He reports current alcohol use of about 5.0 standard drinks of alcohol per week. He reports that he does not use drugs.  Current Outpatient Medications on File Prior to Visit  Medication Sig Dispense Refill  . Albuterol Sulfate (PROAIR RESPICLICK) 108 (90 Base) MCG/ACT AEPB Inhale 2 puffs into the lungs 4 (four) times daily as needed (shortness of breath). 1 each 11  . ALPRAZolam (XANAX) 0.5 MG tablet Take 1 tablet (0.5 mg total) by mouth at bedtime. 30 tablet 4  . aspirin EC 81 MG tablet Take 81 mg by mouth daily.    . Cyanocobalamin (VITAMIN B-12 PO) Take by mouth.    . famotidine (PEPCID) 40 MG tablet Take 1 tablet (40 mg total) by mouth at bedtime. 90 tablet 3  .   linaclotide (LINZESS) 72  MCG capsule Take 1 capsule (72 mcg total) by mouth daily before breakfast. Please d/c amitiza- rx no longer covered 30 capsule 2  . Multiple Vitamins-Minerals (PRESERVISION AREDS) CAPS Take by mouth.    . pantoprazole (PROTONIX) 40 MG tablet TAKE 1 TABLET BY MOUTH  DAILY 90 tablet 1  . silodosin (RAPAFLO) 4 MG CAPS capsule Take 1 capsule (4 mg total) by mouth daily. 90 capsule 1   No current facility-administered medications on file prior to visit.    ROS Review of Systems  Constitutional: Negative for fever.  Respiratory: Negative for shortness of breath.   Cardiovascular: Negative for chest pain.  Gastrointestinal: Positive for nausea.  Genitourinary: Positive for difficulty urinating.  Musculoskeletal: Negative for arthralgias.  Skin: Negative for rash.    Objective:  BP 109/60   Pulse 82   Temp (!) 97.2 F (36.2 C) (Temporal)   Ht 5' 7" (1.702 m)   Wt 129 lb 12.8 oz (58.9 kg)   BMI 20.33 kg/m   BP Readings from Last 3 Encounters:  01/07/21 109/60  10/21/20 136/60  09/18/20 123/63    Wt Readings from Last 3 Encounters:  01/07/21 129 lb 12.8 oz (58.9 kg)  10/21/20 130 lb 12.8 oz (59.3 kg)  09/18/20 133 lb 12.8 oz (60.7 kg)     Physical Exam Vitals reviewed.  Constitutional:      Appearance: He is well-developed and well-nourished.  HENT:     Head: Normocephalic and atraumatic.     Right Ear: Tympanic membrane and external ear normal. No decreased hearing noted.     Left Ear: Tympanic membrane and external ear normal. No decreased hearing noted.     Mouth/Throat:     Pharynx: No oropharyngeal exudate or posterior oropharyngeal erythema.  Eyes:     Pupils: Pupils are equal, round, and reactive to light.  Cardiovascular:     Rate and Rhythm: Normal rate and regular rhythm.     Heart sounds: No murmur heard.   Pulmonary:     Effort: No respiratory distress.     Breath sounds: Normal breath sounds.  Abdominal:     General: Bowel sounds are normal.      Palpations: Abdomen is soft. There is no mass.     Tenderness: There is no abdominal tenderness.  Musculoskeletal:     Cervical back: Normal range of motion and neck supple.       Assessment & Plan:   Albert Allen was seen today for medical management of chronic issues.  Diagnoses and all orders for this visit:  Hypothyroidism, unspecified type -     CBC with Differential/Platelet -     CMP14+EGFR -     TSH + free T4  Mixed hyperlipidemia -     CBC with Differential/Platelet -     CMP14+EGFR -     TSH + free T4  Chronic midline low back pain without sciatica -     CBC with Differential/Platelet -     CMP14+EGFR -     TSH + free T4  Hernia, hiatal -     CBC with Differential/Platelet -     CMP14+EGFR -     TSH + free T4  ANEMIA, B12 DEFICIENCY -     CBC with Differential/Platelet -     CMP14+EGFR -     TSH + free T4 -     Vitamin B12  Essential hypertension -     CBC with Differential/Platelet -       CMP14+EGFR -     TSH + free T4 -     olmesartan (BENICAR) 20 MG tablet; Take 1 tablet (20 mg total) by mouth daily.  PSA, INCREASED -     PSA, total and free  Vitamin D deficiency -     VITAMIN D 25 Hydroxy (Vit-D Deficiency, Fractures)  Other orders -     PSA, total and free -     Folate -     Specimen status report -     diclofenac (VOLTAREN) 50 MG EC tablet; Take 1 tablet (50 mg total) by mouth daily. TAKE 1 TABLET BY MOUTH  TWICE DAILY AS NEEDED FOR  JOINT OR MUSCLE PAIN   Allergies as of 01/07/2021      Reactions   Amoxicillin Swelling, Other (See Comments)   Pt has swelling with PCN's   Penicillins Swelling   Lips swelling Has patient had a PCN reaction causing immediate rash, facial/tongue/throat swelling, SOB or lightheadedness with hypotension: No Has patient had a PCN reaction causing severe rash involving mucus membranes or skin necrosis: No Has patient had a PCN reaction that required hospitalization No Has patient had a PCN reaction occurring within  the last 10 years: No If all of the above answers are "NO", then may proceed with Cephalosporin use.   Ceclor [cefaclor] Other (See Comments)   Pt does not remember reaction   Celebrex [celecoxib] Other (See Comments)   Pt does not remember reaction   Levaquin [levofloxacin In D5w] Other (See Comments)   Pt does not remember reaction      Medication List       Accurate as of January 07, 2021 11:59 PM. If you have any questions, ask your nurse or doctor.        STOP taking these medications   pravastatin 40 MG tablet Commonly known as: PRAVACHOL Stopped by:  , MD     TAKE these medications   ALPRAZolam 0.5 MG tablet Commonly known as: XANAX Take 1 tablet (0.5 mg total) by mouth at bedtime.   aspirin EC 81 MG tablet Take 81 mg by mouth daily.   diclofenac 50 MG EC tablet Commonly known as: VOLTAREN Take 1 tablet (50 mg total) by mouth daily. TAKE 1 TABLET BY MOUTH  TWICE DAILY AS NEEDED FOR  JOINT OR MUSCLE PAIN What changed:   medication strength  how much to take  how to take this  when to take this Changed by:  , MD   famotidine 40 MG tablet Commonly known as: Pepcid Take 1 tablet (40 mg total) by mouth at bedtime.   levothyroxine 75 MCG tablet Commonly known as: SYNTHROID TAKE 1 TABLET BY MOUTH  DAILY BEFORE BREAKFAST   linaclotide 72 MCG capsule Commonly known as: Linzess Take 1 capsule (72 mcg total) by mouth daily before breakfast. Please d/c amitiza- rx no longer covered   olmesartan 20 MG tablet Commonly known as: BENICAR Take 1 tablet (20 mg total) by mouth daily. What changed:   medication strength  how much to take Changed by:  , MD   pantoprazole 40 MG tablet Commonly known as: PROTONIX TAKE 1 TABLET BY MOUTH  DAILY   PreserVision AREDS Caps Take by mouth.   ProAir RespiClick 108 (90 Base) MCG/ACT Aepb Generic drug: Albuterol Sulfate Inhale 2 puffs into the lungs 4 (four) times daily as needed  (shortness of breath).   silodosin 4 MG Caps capsule Commonly known as: RAPAFLO Take 1 capsule (4 mg total)   by mouth daily.   VITAMIN B-12 PO Take by mouth.       Meds ordered this encounter  Medications  . diclofenac (VOLTAREN) 50 MG EC tablet    Sig: Take 1 tablet (50 mg total) by mouth daily. TAKE 1 TABLET BY MOUTH  TWICE DAILY AS NEEDED FOR  JOINT OR MUSCLE PAIN    Dispense:  30 tablet    Refill:  2  . olmesartan (BENICAR) 20 MG tablet    Sig: Take 1 tablet (20 mg total) by mouth daily.    Dispense:  90 tablet    Refill:  1    Asked him to reduce his diclofenac to once a day dose of the lower strength in order to reduce the discomfort in the hiatal hernia.  He also was to discontinue the pravastatin due to concerns over polypharmacy.  Since blood pressures tending toward the low side I also asked him to decrease the olmesartan to 20 mg daily.  Follow-up: Return in about 6 weeks (around 02/18/2021).  [name], M.D.

## 2021-01-08 ENCOUNTER — Other Ambulatory Visit: Payer: Self-pay | Admitting: Family Medicine

## 2021-01-08 ENCOUNTER — Other Ambulatory Visit: Payer: Self-pay

## 2021-01-08 DIAGNOSIS — R710 Precipitous drop in hematocrit: Secondary | ICD-10-CM

## 2021-01-08 DIAGNOSIS — J206 Acute bronchitis due to rhinovirus: Secondary | ICD-10-CM

## 2021-01-08 DIAGNOSIS — E039 Hypothyroidism, unspecified: Secondary | ICD-10-CM

## 2021-01-08 LAB — CBC WITH DIFFERENTIAL/PLATELET
Basophils Absolute: 0.1 10*3/uL (ref 0.0–0.2)
Basos: 2 %
EOS (ABSOLUTE): 0.1 10*3/uL (ref 0.0–0.4)
Eos: 1 %
Hematocrit: 28 % — ABNORMAL LOW (ref 37.5–51.0)
Hemoglobin: 9.4 g/dL — ABNORMAL LOW (ref 13.0–17.7)
Lymphocytes Absolute: 1.3 10*3/uL (ref 0.7–3.1)
Lymphs: 25 %
MCH: 36.3 pg — ABNORMAL HIGH (ref 26.6–33.0)
MCHC: 33.6 g/dL (ref 31.5–35.7)
MCV: 108 fL — ABNORMAL HIGH (ref 79–97)
Monocytes Absolute: 0.8 10*3/uL (ref 0.1–0.9)
Monocytes: 16 %
Neutrophils Absolute: 2.8 10*3/uL (ref 1.4–7.0)
Neutrophils: 56 %
Platelets: 117 10*3/uL — ABNORMAL LOW (ref 150–450)
RBC: 2.59 x10E6/uL — CL (ref 4.14–5.80)
RDW: 13.1 % (ref 11.6–15.4)
WBC: 5 10*3/uL (ref 3.4–10.8)

## 2021-01-08 LAB — CMP14+EGFR
ALT: 7 IU/L (ref 0–44)
AST: 13 IU/L (ref 0–40)
Albumin/Globulin Ratio: 2 (ref 1.2–2.2)
Albumin: 4.2 g/dL (ref 3.5–4.6)
Alkaline Phosphatase: 69 IU/L (ref 44–121)
BUN/Creatinine Ratio: 22 (ref 10–24)
BUN: 31 mg/dL (ref 10–36)
Bilirubin Total: 0.3 mg/dL (ref 0.0–1.2)
CO2: 18 mmol/L — ABNORMAL LOW (ref 20–29)
Calcium: 9 mg/dL (ref 8.6–10.2)
Chloride: 106 mmol/L (ref 96–106)
Creatinine, Ser: 1.4 mg/dL — ABNORMAL HIGH (ref 0.76–1.27)
GFR calc Af Amer: 49 mL/min/{1.73_m2} — ABNORMAL LOW (ref >59)
GFR calc non Af Amer: 42 mL/min/{1.73_m2} — ABNORMAL LOW (ref >59)
Globulin, Total: 2.1 g/dL (ref 1.5–4.5)
Glucose: 109 mg/dL — ABNORMAL HIGH (ref 65–99)
Potassium: 4.9 mmol/L (ref 3.5–5.2)
Sodium: 136 mmol/L (ref 134–144)
Total Protein: 6.3 g/dL (ref 6.0–8.5)

## 2021-01-08 LAB — VITAMIN D 25 HYDROXY (VIT D DEFICIENCY, FRACTURES): Vit D, 25-Hydroxy: 53.3 ng/mL (ref 30.0–100.0)

## 2021-01-08 LAB — TSH+FREE T4
Free T4: 1.62 ng/dL (ref 0.82–1.77)
TSH: 4.58 u[IU]/mL — ABNORMAL HIGH (ref 0.450–4.500)

## 2021-01-08 LAB — PSA, TOTAL AND FREE
PSA, Free Pct: 20 %
PSA, Free: 0.26 ng/mL
Prostate Specific Ag, Serum: 1.3 ng/mL (ref 0.0–4.0)

## 2021-01-08 LAB — VITAMIN B12: Vitamin B-12: 778 pg/mL (ref 232–1245)

## 2021-01-08 MED ORDER — LEVOTHYROXINE SODIUM 88 MCG PO TABS: 88.0000 ug | ORAL_TABLET | Freq: Every day | ORAL | 1 refills | Status: DC

## 2021-01-10 ENCOUNTER — Encounter: Payer: Self-pay | Admitting: Family Medicine

## 2021-01-10 LAB — SPECIMEN STATUS REPORT

## 2021-01-10 LAB — FOLATE: Folate: 6.8 ng/mL (ref >3.0)

## 2021-01-10 MED ORDER — DICLOFENAC SODIUM 50 MG PO TBEC: 50.0000 mg | DELAYED_RELEASE_TABLET | Freq: Every day | ORAL | 2 refills | Status: DC

## 2021-01-10 MED ORDER — OLMESARTAN MEDOXOMIL 20 MG PO TABS: 20.0000 mg | ORAL_TABLET | Freq: Every day | ORAL | 1 refills | Status: DC

## 2021-01-20 ENCOUNTER — Other Ambulatory Visit: Payer: Self-pay | Admitting: Family Medicine

## 2021-01-20 DIAGNOSIS — E782 Mixed hyperlipidemia: Secondary | ICD-10-CM

## 2021-01-21 ENCOUNTER — Other Ambulatory Visit: Payer: Self-pay | Admitting: Family Medicine

## 2021-01-21 DIAGNOSIS — E782 Mixed hyperlipidemia: Secondary | ICD-10-CM

## 2021-01-22 ENCOUNTER — Other Ambulatory Visit: Payer: Self-pay | Admitting: Family Medicine

## 2021-01-22 DIAGNOSIS — E782 Mixed hyperlipidemia: Secondary | ICD-10-CM

## 2021-01-23 ENCOUNTER — Other Ambulatory Visit: Payer: Self-pay | Admitting: Family Medicine

## 2021-01-23 DIAGNOSIS — E782 Mixed hyperlipidemia: Secondary | ICD-10-CM

## 2021-02-11 ENCOUNTER — Other Ambulatory Visit: Payer: Self-pay | Admitting: Family Medicine

## 2021-02-12 ENCOUNTER — Other Ambulatory Visit: Payer: Self-pay | Admitting: Family Medicine

## 2021-02-13 ENCOUNTER — Other Ambulatory Visit: Payer: Self-pay | Admitting: Family Medicine

## 2021-02-18 ENCOUNTER — Encounter: Payer: Self-pay | Admitting: Family Medicine

## 2021-02-18 ENCOUNTER — Telehealth: Payer: Self-pay | Admitting: Family Medicine

## 2021-02-18 ENCOUNTER — Other Ambulatory Visit: Payer: Self-pay

## 2021-02-18 ENCOUNTER — Ambulatory Visit (INDEPENDENT_AMBULATORY_CARE_PROVIDER_SITE_OTHER): Payer: Medicare Other | Admitting: Family Medicine

## 2021-02-18 ENCOUNTER — Ambulatory Visit: Payer: Medicare Other | Admitting: Family Medicine

## 2021-02-18 VITALS — BP 124/69 | HR 83 | Temp 97.9°F | Ht 67.0 in | Wt 128.6 lb

## 2021-02-18 DIAGNOSIS — R42 Dizziness and giddiness: Secondary | ICD-10-CM | POA: Diagnosis not present

## 2021-02-18 DIAGNOSIS — W19XXXD Unspecified fall, subsequent encounter: Secondary | ICD-10-CM

## 2021-02-18 DIAGNOSIS — F411 Generalized anxiety disorder: Secondary | ICD-10-CM | POA: Diagnosis not present

## 2021-02-18 MED ORDER — ALPRAZOLAM 0.5 MG PO TABS: 0.5000 mg | ORAL_TABLET | Freq: Every day | ORAL | 4 refills | Status: DC

## 2021-02-18 MED ORDER — MIRTAZAPINE 15 MG PO TABS: 15.0000 mg | ORAL_TABLET | Freq: Every day | ORAL | 5 refills | Status: DC

## 2021-02-18 NOTE — Progress Notes (Signed)
Subjective:  Patient ID: Albert Allen, male    DOB: Apr 25, 1923  Age: 87 y.o. MRN: [id number]  CC: Medical Management of Chronic Issues   HPI Albert Allen presents for fell, no HA but feels crazy headed. Not clear.Feels like head is woozy. No LOC. "Ain't right."  NEck pain for years since 2 fx 40+ years ago. Feels very nervous on awakening. "Like I'll jump out of my skin."    Awakening at 4:30 AM with nausea.   Depression screen [facility] 2/9 02/18/2021 01/07/2021 09/18/2020  Decreased Interest 3 1 1   Down, Depressed, Hopeless 2 0 0  PHQ - 2 Score 5 1 1   Altered sleeping 3 - 0  Tired, decreased energy 3 - 1  Change in appetite 0 - 0  Feeling bad or failure about yourself  2 - 0  Trouble concentrating 0 - 0  Moving slowly or fidgety/restless 0 - 1  Suicidal thoughts 1 - 0  PHQ-9 Score 14 - 3  Difficult doing work/chores Not difficult at all - Not difficult at all  Some recent data might be hidden    History Albert Allen has a past medical history of ABNORMAL CV (STRESS) TEST (03/05/2009), ABUSE, ALCOHOL, IN REMISSION (05/31/2007), Adhesive capsulitis of shoulder (7/78/2423), Alcoholic polyneuropathy ([place]) (03/29/2008), ANEMIA, B12 DEFICIENCY (03/29/2008), Anxiety state, unspecified (11/19/2008), ASTHMA (05/31/2007), Asthma, CAD, NATIVE VESSEL (04/04/2009), CARPAL TUNNEL SYNDROME, RIGHT (09/05/2007), Cataract, Chronic prostatitis (03/29/2008), COPD (chronic obstructive pulmonary disease) ([place]), Crohn's disease ([place]), Degenerative disc disease, cervical, DEGENERATIVE DISC DISEASE, CERVICAL SPINE (06/22/2007), Diverticulosis, DYSPHAGIA UNSPECIFIED (09/25/2008), Esophageal stricture, External hemorrhoids, GERD (05/31/2007), GOUT (05/31/2007), Hepatomegaly (09/25/2008), Hiatal hernia, HIP PAIN (03/20/2010), History of echocardiogram, HYPERLIPIDEMIA (03/27/2009), HYPERTENSION (05/31/2007), HYPOTHYROIDISM (05/31/2007), LEFT BUNDLE BRANCH BLOCK (02/11/2009), Liver hemangioma, Liver mass, left lobe, LOC OSTEOARTHROS NOT SPEC  PRIM/SEC LOWER LEG (03/20/2010), OSTEOARTHRITIS (05/31/2007), PERSONAL HX COLONIC POLYPS (03/05/1997), PSA, INCREASED (03/20/2010), SINUSITIS, CHRONIC NOS (05/31/2007), Sore throat, SYNCOPE (11/19/2008), and WEIGHT LOSS, RECENT (11/19/2008).   He has a past surgical history that includes pul. colapse w/chest tube; Tonsillectomy; carpel tunnel release; ORIF ulnar fracture (11/03/2012); Eye surgery; Inguinal hernia repair (12/18/2015); Inguinal hernia repair (Right, 12/18/2015); Insertion of mesh (Right, 12/18/2015); and Cardiac catheterization.   His family history includes Alcohol abuse in his father; Cancer in his brother and mother; Diabetes in his brother, sister, and sister; Lung disease in his father.He reports that he quit smoking about 22 years ago. His smoking use included cigarettes. He has never used smokeless tobacco. He reports current alcohol use of about 5.0 standard drinks of alcohol per week. He reports that he does not use drugs.    ROS Review of Systems  Constitutional: Negative for fever.  Respiratory: Negative for shortness of breath.   Cardiovascular: Negative for chest pain.  Genitourinary: Positive for frequency (nocturia q2 hours. Sees urology).  Musculoskeletal: Negative for arthralgias.  Skin: Negative for rash.  Psychiatric/Behavioral: Positive for confusion and sleep disturbance. The patient is nervous/anxious.     Objective:  BP 124/69   Pulse 83   Temp 97.9 F (36.6 C)   Ht 5\' 7"  (1.702 m)   Wt 128 lb 9.6 oz (58.3 kg)   SpO2 99%   BMI 20.14 kg/m   BP Readings from Last 3 Encounters:  02/18/21 124/69  01/07/21 109/60  10/21/20 136/60    Wt Readings from Last 3 Encounters:  02/18/21 128 lb 9.6 oz (58.3 kg)  01/07/21 129 lb 12.8 oz (58.9 kg)  10/21/20 130 lb 12.8 oz (59.3 kg)  Physical Exam Vitals reviewed.  Constitutional:      Appearance: He is well-developed.  HENT:     Head: Normocephalic and atraumatic.     Right Ear: Tympanic membrane and  external ear normal. No decreased hearing noted.     Left Ear: Tympanic membrane and external ear normal. No decreased hearing noted.     Mouth/Throat:     Pharynx: No oropharyngeal exudate or posterior oropharyngeal erythema.  Eyes:     Pupils: Pupils are equal, round, and reactive to light.  Cardiovascular:     Rate and Rhythm: Normal rate and regular rhythm.     Heart sounds: No murmur heard.   Pulmonary:     Effort: No respiratory distress.     Breath sounds: Normal breath sounds.  Abdominal:     General: Bowel sounds are normal.     Palpations: Abdomen is soft. There is no mass.     Tenderness: There is no abdominal tenderness.  Musculoskeletal:     Cervical back: Normal range of motion and neck supple.       Assessment & Plan:   Albert Allen was seen today for medical management of chronic issues.  Diagnoses and all orders for this visit:  Fall, subsequent encounter -     CT Head Wo Contrast; Future  GAD (generalized anxiety disorder) -     ALPRAZolam (XANAX) 0.5 MG tablet; Take 1 tablet (0.5 mg total) by mouth at bedtime.  Dizziness -     CT Head Wo Contrast; Future  Other orders -     mirtazapine (REMERON) 15 MG tablet; Take 1 tablet (15 mg total) by mouth at bedtime. For sleep       I am having Albert Allen. Albert Allen start on mirtazapine. I am also having him maintain his aspirin EC, Cyanocobalamin (VITAMIN B-12 PO), PreserVision AREDS, famotidine, silodosin, ProAir RespiClick, linaclotide, pantoprazole, levothyroxine, diclofenac, olmesartan, diclofenac, and ALPRAZolam.  Allergies as of 02/18/2021      Reactions   Amoxicillin Swelling, Other (See Comments)   Pt has swelling with PCN's   Penicillins Swelling   Lips swelling Has patient had a PCN reaction causing immediate rash, facial/tongue/throat swelling, SOB or lightheadedness with hypotension: No Has patient had a PCN reaction causing severe rash involving mucus membranes or skin necrosis: No Has patient  had a PCN reaction that required hospitalization No Has patient had a PCN reaction occurring within the last 10 years: No If all of the above answers are "NO", then may proceed with Cephalosporin use.   Ceclor [cefaclor] Other (See Comments)   Pt does not remember reaction   Celebrex [celecoxib] Other (See Comments)   Pt does not remember reaction   Levaquin [levofloxacin In D5w] Other (See Comments)   Pt does not remember reaction      Medication List       Accurate as of February 18, 2021 11:15 AM. If you have any questions, ask your nurse or doctor.        ALPRAZolam 0.5 MG tablet Commonly known as: XANAX Take 1 tablet (0.5 mg total) by mouth at bedtime.   aspirin EC 81 MG tablet Take 81 mg by mouth daily.   diclofenac 50 MG EC tablet Commonly known as: VOLTAREN Take 1 tablet (50 mg total) by mouth daily. TAKE 1 TABLET BY MOUTH  TWICE DAILY AS NEEDED FOR  JOINT OR MUSCLE PAIN   diclofenac 75 MG EC tablet Commonly known as: VOLTAREN TAKE 1 TABLET BY MOUTH  TWICE DAILY  AS NEEDED FOR  JOINT OR MUSCLE PAIN   famotidine 40 MG tablet Commonly known as: Pepcid Take 1 tablet (40 mg total) by mouth at bedtime.   levothyroxine 88 MCG tablet Commonly known as: SYNTHROID Take 1 tablet (88 mcg total) by mouth daily before breakfast. On an empty stomach   linaclotide 72 MCG capsule Commonly known as: Linzess Take 1 capsule (72 mcg total) by mouth daily before breakfast. Please d/c amitiza- rx no longer covered   mirtazapine 15 MG tablet Commonly known as: Remeron Take 1 tablet (15 mg total) by mouth at bedtime. For sleep Started by: [name], MD   olmesartan 20 MG tablet Commonly known as: BENICAR Take 1 tablet (20 mg total) by mouth daily.   pantoprazole 40 MG tablet Commonly known as: PROTONIX TAKE 1 TABLET BY MOUTH  DAILY   PreserVision AREDS Caps Take by mouth.   ProAir RespiClick 854 (90 Base) MCG/ACT Aepb Generic drug: Albuterol Sulfate Inhale 2 puffs into  the lungs 4 (four) times daily as needed (shortness of breath).   silodosin 4 MG Caps capsule Commonly known as: RAPAFLO Take 1 capsule (4 mg total) by mouth daily.   VITAMIN B-12 PO Take by mouth.        Follow-up: Return in about 6 months (around 08/21/2021), or if symptoms worsen or fail to improve.  [name], M.D.

## 2021-03-05 ENCOUNTER — Other Ambulatory Visit: Payer: Self-pay | Admitting: Family Medicine

## 2021-03-05 ENCOUNTER — Other Ambulatory Visit: Payer: Self-pay | Admitting: Internal Medicine

## 2021-03-05 DIAGNOSIS — F411 Generalized anxiety disorder: Secondary | ICD-10-CM

## 2021-03-17 ENCOUNTER — Other Ambulatory Visit: Payer: Self-pay

## 2021-03-17 ENCOUNTER — Ambulatory Visit (HOSPITAL_COMMUNITY)
Admission: RE | Admit: 2021-03-17 | Discharge: 2021-03-17 | Disposition: A | Discharge status: Home / Self Care | Payer: Medicare Other | Source: Ambulatory Visit | Attending: Family Medicine | Admitting: Family Medicine

## 2021-03-17 DIAGNOSIS — R4182 Altered mental status, unspecified: Secondary | ICD-10-CM | POA: Diagnosis present

## 2021-03-17 DIAGNOSIS — W19XXXD Unspecified fall, subsequent encounter: Secondary | ICD-10-CM | POA: Insufficient documentation

## 2021-03-17 DIAGNOSIS — R42 Dizziness and giddiness: Secondary | ICD-10-CM | POA: Diagnosis not present

## 2021-03-17 DIAGNOSIS — G9389 Other specified disorders of brain: Secondary | ICD-10-CM | POA: Diagnosis not present

## 2021-03-17 DIAGNOSIS — I6782 Cerebral ischemia: Secondary | ICD-10-CM | POA: Diagnosis not present

## 2021-03-17 DIAGNOSIS — I6523 Occlusion and stenosis of bilateral carotid arteries: Secondary | ICD-10-CM | POA: Diagnosis not present

## 2021-04-03 DIAGNOSIS — N138 Other obstructive and reflux uropathy: Secondary | ICD-10-CM | POA: Diagnosis not present

## 2021-04-25 ENCOUNTER — Ambulatory Visit (INDEPENDENT_AMBULATORY_CARE_PROVIDER_SITE_OTHER): Payer: Medicare Other | Admitting: Family Medicine

## 2021-04-25 ENCOUNTER — Other Ambulatory Visit: Payer: Self-pay

## 2021-04-25 ENCOUNTER — Encounter: Payer: Self-pay | Admitting: Family Medicine

## 2021-04-25 VITALS — BP 129/63 | HR 81 | Temp 97.5°F | Ht 67.0 in | Wt 125.8 lb

## 2021-04-25 DIAGNOSIS — Z79899 Other long term (current) drug therapy: Secondary | ICD-10-CM

## 2021-04-25 DIAGNOSIS — I10 Essential (primary) hypertension: Secondary | ICD-10-CM

## 2021-04-25 MED ORDER — OLMESARTAN MEDOXOMIL 20 MG PO TABS: 10.0000 mg | ORAL_TABLET | Freq: Every day | ORAL | 1 refills | Status: DC

## 2021-04-25 NOTE — Patient Instructions (Signed)
Cut your olmesartan (Benicar) in half. This will be a decrease from 20 mg to 10 mg.   You can decrease your Xanax from 0.25 mg (half tablet) from daily to every other day.

## 2021-04-25 NOTE — Progress Notes (Signed)
Assessment & Plan:  1. Essential hypertension Soft blood pressures.  Advised patient to decrease olmesartan from 20 mg to 10 mg once daily and keep a log of his blood pressures.  Advised to bring this log back when he comes to see his PCP for follow-up as he may no longer need his blood pressure medication. - olmesartan (BENICAR) 20 MG tablet; Take 0.5 tablets (10 mg total) by mouth daily.  Dispense: 90 tablet; Refill: 1  2. Long term prescription benzodiazepine use Discussed with patient that he cannot stop Xanax cold [place] without the risk of withdrawal symptoms and seizures.  Since he is currently only taking 0.25 mg once daily I advised that he decrease to 0.25 mg every other day until he follows up with his PCP.   Follow up plan: Return in about 3 weeks (around 05/16/2021) for HTN & Xanax.  Hendricks Limes, MSN, APRN, FNP-C [facility]  Subjective:   Patient ID: Albert Allen, male    DOB: 28-Jul-1923, 87 y.o.   MRN: [id number]  HPI: Albert Allen is a 87 y.o. male presenting on 04/25/2021 for Hypotension (Patient states his BP on Tuesday was 90/60 )  Patient [place] [facility] [place] [facility] 90/60.  He reports he does experience a lot of dizziness in general.  He does not consistently check his blood pressure at home but did check it recently since the low and his systolic was in the 093A.  He has not taken his blood pressure medicine since Tuesday.  While he was here patient mentioned he no longer wants to be on Xanax and that he is just going to quit taking it.  His Xanax prescription is for 0.5 mg once daily.  He states for several months he has only been taking 0.25 mg once daily.   ROS: Negative unless specifically indicated above in HPI.   Relevant past medical history reviewed and updated as indicated.   Allergies and medications reviewed and updated.   Current Outpatient Medications:  .   Albuterol Sulfate (PROAIR RESPICLICK) 355 (90 Base) MCG/ACT AEPB, Inhale 2 puffs into the lungs 4 (four) times daily as needed (shortness of breath)., Disp: 1 each, Rfl: 11 .  ALPRAZolam (XANAX) 0.5 MG tablet, Take 1 tablet (0.5 mg total) by mouth at bedtime., Disp: 30 tablet, Rfl: 4 .  aspirin EC 81 MG tablet, Take 81 mg by mouth daily., Disp: , Rfl:  .  Cyanocobalamin (VITAMIN B-12 PO), Take by mouth., Disp: , Rfl:  .  diclofenac (VOLTAREN) 50 MG EC tablet, Take 1 tablet (50 mg total) by mouth daily. TAKE 1 TABLET BY MOUTH  TWICE DAILY AS NEEDED FOR  JOINT OR MUSCLE PAIN, Disp: 30 tablet, Rfl: 2 .  diclofenac (VOLTAREN) 75 MG EC tablet, TAKE 1 TABLET BY MOUTH  TWICE DAILY AS NEEDED FOR  JOINT OR MUSCLE PAIN, Disp: 180 tablet, Rfl: 0 .  famotidine (PEPCID) 40 MG tablet, Take 1 tablet (40 mg total) by mouth at bedtime., Disp: 90 tablet, Rfl: 3 .  levothyroxine (SYNTHROID) 88 MCG tablet, Take 1 tablet (88 mcg total) by mouth daily before breakfast. On an empty stomach, Disp: 90 tablet, Rfl: 1 .  LINZESS 72 MCG capsule, Take 1 capsule daily before breakfast., Disp: 90 capsule, Rfl: 0 .  Multiple Vitamins-Minerals (PRESERVISION AREDS) CAPS, Take by mouth., Disp: , Rfl:  .  olmesartan (BENICAR) 20 MG tablet, Take 1 tablet (20 mg  total) by mouth daily., Disp: 90 tablet, Rfl: 1 .  pantoprazole (PROTONIX) 40 MG tablet, TAKE 1 TABLET BY MOUTH  DAILY, Disp: 90 tablet, Rfl: 1 .  silodosin (RAPAFLO) 4 MG CAPS capsule, Take 1 capsule (4 mg total) by mouth daily., Disp: 90 capsule, Rfl: 1  Allergies  Allergen Reactions  . Amoxicillin Swelling and Other (See Comments)    Pt has swelling with PCN's  . Penicillins Swelling    Lips swelling Has patient had a PCN reaction causing immediate rash, facial/tongue/throat swelling, SOB or lightheadedness with hypotension: No Has patient had a PCN reaction causing severe rash involving mucus membranes or skin necrosis: No Has patient had a PCN reaction that required  hospitalization No Has patient had a PCN reaction occurring within the last 10 years: No If all of the above answers are "NO", then may proceed with Cephalosporin use.  Blair Dolphin [Cefaclor] Other (See Comments)    Pt does not remember reaction  . Celebrex [Celecoxib] Other (See Comments)    Pt does not remember reaction  . Levaquin [Levofloxacin In D5w] Other (See Comments)    Pt does not remember reaction    Objective:   BP 129/63   Pulse 81   Temp (!) 97.5 F (36.4 C) (Temporal)   Ht 5\' 7"  (1.702 m)   Wt 125 lb 12.8 oz (57.1 kg)   SpO2 99%   BMI 19.70 kg/m    Physical Exam Vitals reviewed.  Constitutional:      General: He is not in acute distress.    Appearance: Normal appearance. He is not ill-appearing, toxic-appearing or diaphoretic.  HENT:     Head: Normocephalic and atraumatic.  Eyes:     General: No scleral icterus.       Right eye: No discharge.        Left eye: No discharge.     Conjunctiva/sclera: Conjunctivae normal.  Cardiovascular:     Rate and Rhythm: Normal rate and regular rhythm.     Heart sounds: Normal heart sounds. No murmur heard. No friction rub. No gallop.   Pulmonary:     Effort: Pulmonary effort is normal. No respiratory distress.     Breath sounds: Normal breath sounds. No stridor. No wheezing, rhonchi or rales.  Musculoskeletal:        General: Normal range of motion.     Cervical back: Normal range of motion.  Skin:    General: Skin is warm and dry.  Neurological:     Mental Status: He is alert and oriented to person, place, and time. Mental status is at baseline.  Psychiatric:        Mood and Affect: Mood normal.        Behavior: Behavior normal.        Thought Content: Thought content normal.        Judgment: Judgment normal.

## 2021-05-06 ENCOUNTER — Other Ambulatory Visit: Payer: Self-pay | Admitting: Family Medicine

## 2021-05-19 ENCOUNTER — Other Ambulatory Visit: Payer: Self-pay

## 2021-05-19 ENCOUNTER — Encounter: Payer: Self-pay | Admitting: Family Medicine

## 2021-05-19 ENCOUNTER — Ambulatory Visit (INDEPENDENT_AMBULATORY_CARE_PROVIDER_SITE_OTHER): Payer: Medicare Other | Admitting: Family Medicine

## 2021-05-19 VITALS — BP 107/51 | HR 83 | Temp 97.5°F | Ht 67.0 in | Wt 128.0 lb

## 2021-05-19 DIAGNOSIS — F411 Generalized anxiety disorder: Secondary | ICD-10-CM

## 2021-05-19 DIAGNOSIS — R1013 Epigastric pain: Secondary | ICD-10-CM | POA: Diagnosis not present

## 2021-05-19 MED ORDER — ALPRAZOLAM 0.5 MG PO TABS: 0.5000 mg | ORAL_TABLET | Freq: Every day | ORAL | 4 refills | Status: DC

## 2021-05-19 NOTE — Progress Notes (Signed)
Of lso wants to start weaning off alcohol.  He says he is drinking about 4/2pints of [employer] weekly.  He should cut back by half of 1/2 pint bottle weekly until he is completely off of the liquor.  He is also tapering Xanax and should use that on Monday and Thursday nights only  Having a whole lot of heartburn from hiatal hernia. Losing weight. Eating good. Concerned that the linzess is causing weight loss.  Depression screen [facility] 2/9 05/19/2021 05/19/2021 04/25/2021  Decreased Interest 0 0 1  Down, Depressed, Hopeless 0 0 2  PHQ - 2 Score 0 0 3  Altered sleeping 2 - 2  Tired, decreased energy 1 - 2  Change in appetite 0 - 0  Feeling bad or failure about yourself  0 - 0  Trouble concentrating 0 - 0  Moving slowly or fidgety/restless 1 - 0  Suicidal thoughts 0 - 0  PHQ-9 Score 4 - 7  Difficult doing work/chores Not difficult at all - Not difficult at all  Some recent data might be hidden    History [place] has a past medical history of ABNORMAL CV (STRESS) TEST (03/05/2009), ABUSE, ALCOHOL, IN REMISSION (05/31/2007), Adhesive capsulitis of shoulder (4/69/6295), Alcoholic polyneuropathy ([place]) (03/29/2008), ANEMIA, B12 DEFICIENCY (03/29/2008), Anxiety state, unspecified (11/19/2008), ASTHMA (05/31/2007), Asthma, CAD, NATIVE VESSEL (04/04/2009), CARPAL TUNNEL SYNDROME, RIGHT (09/05/2007), Cataract, Chronic prostatitis (03/29/2008), COPD (chronic obstructive pulmonary disease) ([place]), Crohn's disease ([place]), Degenerative disc disease, cervical, DEGENERATIVE DISC DISEASE, CERVICAL SPINE (06/22/2007), Diverticulosis, DYSPHAGIA UNSPECIFIED (09/25/2008), Esophageal stricture, External hemorrhoids, GERD (05/31/2007), GOUT (05/31/2007), Hepatomegaly (09/25/2008), Hiatal hernia, HIP PAIN (03/20/2010), History of echocardiogram, HYPERLIPIDEMIA (03/27/2009), HYPERTENSION (05/31/2007), HYPOTHYROIDISM (05/31/2007), LEFT BUNDLE BRANCH BLOCK (02/11/2009), Liver hemangioma, Liver mass, left lobe, LOC OSTEOARTHROS NOT SPEC PRIM/SEC LOWER  LEG (03/20/2010), OSTEOARTHRITIS (05/31/2007), PERSONAL HX COLONIC POLYPS (03/05/1997), PSA, INCREASED (03/20/2010), SINUSITIS, CHRONIC NOS (05/31/2007), Sore throat, SYNCOPE (11/19/2008), and WEIGHT LOSS, RECENT (11/19/2008).   He has a past surgical history that includes pul. colapse w/chest tube; Tonsillectomy; carpel tunnel release; ORIF ulnar fracture (11/03/2012); Eye surgery; Inguinal hernia repair (12/18/2015); Inguinal hernia repair (Right, 12/18/2015); Insertion of mesh (Right, 12/18/2015); and Cardiac catheterization.   His family history includes Alcohol abuse in his father; Cancer in his brother and mother; Diabetes in his brother, sister, and sister; Lung disease in his father.He reports that he quit smoking about 23 years ago. His smoking use included cigarettes. He has never used smokeless tobacco. He reports current alcohol use of about 5.0 standard drinks of alcohol per week. He reports that he does not use drugs.    ROS Review of Systems  Constitutional:  Negative for fever.  Respiratory:  Negative for shortness of breath.   Cardiovascular:  Negative for chest pain.  Gastrointestinal:  Positive for abdominal pain. Negative for blood in stool and nausea.  Musculoskeletal:  Negative for arthralgias.  Skin:  Negative for rash.   Objective:  BP (!) 107/51   Pulse 83   Temp (!) 97.5 F (36.4 C)   Ht 5\' 7"  (1.702 m)   Wt 128 lb (58.1 kg)   SpO2 98%   BMI 20.05 kg/m   BP Readings from Last 3 Encounters:  05/19/21 (!) 107/51  04/25/21 129/63  02/18/21 124/69    Wt Readings from Last 3 Encounters:  05/19/21 128 lb (58.1 kg)  04/25/21 125 lb 12.8 oz (57.1 kg)  02/18/21 128 lb 9.6 oz (58.3 kg)     Physical Exam Vitals reviewed.  Constitutional:  Appearance: He is well-developed.  HENT:     Head: Normocephalic and atraumatic.     Right Ear: External ear normal.     Left Ear: External ear normal.     Mouth/Throat:     Pharynx: No oropharyngeal exudate or posterior  oropharyngeal erythema.  Eyes:     Pupils: Pupils are equal, round, and reactive to light.  Cardiovascular:     Rate and Rhythm: Normal rate and regular rhythm.     Heart sounds: No murmur heard. Pulmonary:     Effort: No respiratory distress.     Breath sounds: Normal breath sounds.  Musculoskeletal:     Cervical back: Normal range of motion and neck supple.  Neurological:     Mental Status: He is alert and oriented to person, place, and time.      Assessment & Plan:   There are no diagnoses linked to this encounter.     I am having Coralyn Pear. Cimmino maintain his aspirin EC, Cyanocobalamin (VITAMIN B-12 PO), PreserVision AREDS, famotidine, silodosin, ProAir RespiClick, pantoprazole, levothyroxine, ALPRAZolam, Linzess, olmesartan, and diclofenac.  Allergies as of 05/19/2021       Reactions   Amoxicillin Swelling, Other (See Comments)   Pt has swelling with PCN's   Penicillins Swelling   Lips swelling Has patient had a PCN reaction causing immediate rash, facial/tongue/throat swelling, SOB or lightheadedness with hypotension: No Has patient had a PCN reaction causing severe rash involving mucus membranes or skin necrosis: No Has patient had a PCN reaction that required hospitalization No Has patient had a PCN reaction occurring within the last 10 years: No If all of the above answers are "NO", then may proceed with Cephalosporin use.   Ceclor [cefaclor] Other (See Comments)   Pt does not remember reaction   Celebrex [celecoxib] Other (See Comments)   Pt does not remember reaction   Levaquin [levofloxacin In D5w] Other (See Comments)   Pt does not remember reaction        Medication List        Accurate as of May 19, 2021 11:09 AM. If you have any questions, ask your nurse or doctor.          ALPRAZolam 0.5 MG tablet Commonly known as: XANAX Take 1 tablet (0.5 mg total) by mouth at bedtime.   aspirin EC 81 MG tablet Take 81 mg by mouth daily.    diclofenac 75 MG EC tablet Commonly known as: VOLTAREN TAKE 1 TABLET BY MOUTH  TWICE DAILY AS NEEDED FOR  JOINT OR MUSCLE PAIN   famotidine 40 MG tablet Commonly known as: Pepcid Take 1 tablet (40 mg total) by mouth at bedtime.   levothyroxine 88 MCG tablet Commonly known as: SYNTHROID Take 1 tablet (88 mcg total) by mouth daily before breakfast. On an empty stomach   Linzess 72 MCG capsule Generic drug: linaclotide Take 1 capsule daily before breakfast.   olmesartan 20 MG tablet Commonly known as: BENICAR Take 0.5 tablets (10 mg total) by mouth daily.   pantoprazole 40 MG tablet Commonly known as: PROTONIX TAKE 1 TABLET BY MOUTH  DAILY   PreserVision AREDS Caps Take by mouth.   ProAir RespiClick 235 (90 Base) MCG/ACT Aepb Generic drug: Albuterol Sulfate Inhale 2 puffs into the lungs 4 (four) times daily as needed (shortness of breath).   silodosin 4 MG Caps capsule Commonly known as: RAPAFLO Take 1 capsule (4 mg total) by mouth daily.   VITAMIN B-12 PO Take by mouth.  Follow-up: No follow-ups on file.  [name], M.D.

## 2021-05-19 NOTE — Patient Instructions (Signed)
Decrease alcohol intake by a half of a half-pint bottle weekly until yo are completely off of alcohol. It should take about 7 weeks.  Take xanax only on Monday and Thursday nights (2 times a week)

## 2021-05-29 ENCOUNTER — Other Ambulatory Visit: Payer: Self-pay | Admitting: Family Medicine

## 2021-05-29 DIAGNOSIS — N401 Enlarged prostate with lower urinary tract symptoms: Secondary | ICD-10-CM

## 2021-06-16 ENCOUNTER — Other Ambulatory Visit: Payer: Self-pay | Admitting: Family Medicine

## 2021-06-17 ENCOUNTER — Other Ambulatory Visit: Payer: Self-pay | Admitting: Internal Medicine

## 2021-06-19 ENCOUNTER — Encounter: Payer: Self-pay | Admitting: Family Medicine

## 2021-06-19 ENCOUNTER — Other Ambulatory Visit: Payer: Self-pay

## 2021-06-19 ENCOUNTER — Ambulatory Visit (INDEPENDENT_AMBULATORY_CARE_PROVIDER_SITE_OTHER): Payer: Medicare Other | Admitting: Family Medicine

## 2021-06-19 DIAGNOSIS — J206 Acute bronchitis due to rhinovirus: Secondary | ICD-10-CM

## 2021-06-19 DIAGNOSIS — K2100 Gastro-esophageal reflux disease with esophagitis, without bleeding: Secondary | ICD-10-CM | POA: Diagnosis not present

## 2021-06-19 DIAGNOSIS — I10 Essential (primary) hypertension: Secondary | ICD-10-CM

## 2021-06-19 DIAGNOSIS — E039 Hypothyroidism, unspecified: Secondary | ICD-10-CM | POA: Diagnosis not present

## 2021-06-19 MED ORDER — LEVOTHYROXINE SODIUM 88 MCG PO TABS: 88.0000 ug | ORAL_TABLET | Freq: Every day | ORAL | 3 refills | Status: DC

## 2021-06-19 MED ORDER — FAMOTIDINE 40 MG PO TABS
40.0000 mg | ORAL_TABLET | Freq: Every day | ORAL | 3 refills | Status: DC
Start: 2021-06-19 — End: 2021-09-26

## 2021-06-19 NOTE — Progress Notes (Signed)
Subjective:  Patient ID: Albert Allen, male    DOB: 1923/01/17  Age: 87 y.o. MRN: [id number]  CC: Follow-up (dyspepsia)   HPI Albert Allen presents for RLQ pain. Described as nausea. Awaiting referral to GI. Sensation  is intermittent. Not related to any foods ingested or to any activity. No relief with meds. Prefers GI to see for workup, but hasn't heard from them.   Depression screen [facility] 2/9 06/19/2021 05/19/2021 05/19/2021  Decreased Interest 0 0 0  Down, Depressed, Hopeless 0 0 0  PHQ - 2 Score 0 0 0  Altered sleeping - 2 -  Tired, decreased energy - 1 -  Change in appetite - 0 -  Feeling bad or failure about yourself  - 0 -  Trouble concentrating - 0 -  Moving slowly or fidgety/restless - 1 -  Suicidal thoughts - 0 -  PHQ-9 Score - 4 -  Difficult doing work/chores - Not difficult at all -  Some recent data might be hidden    History Damarion has a past medical history of ABNORMAL CV (STRESS) TEST (03/05/2009), ABUSE, ALCOHOL, IN REMISSION (05/31/2007), Adhesive capsulitis of shoulder (12/18/4172), Alcoholic polyneuropathy ([place]) (03/29/2008), ANEMIA, B12 DEFICIENCY (03/29/2008), Anxiety state, unspecified (11/19/2008), ASTHMA (05/31/2007), Asthma, CAD, NATIVE VESSEL (04/04/2009), CARPAL TUNNEL SYNDROME, RIGHT (09/05/2007), Cataract, Chronic prostatitis (03/29/2008), COPD (chronic obstructive pulmonary disease) ([place]), Crohn's disease ([place]), Degenerative disc disease, cervical, DEGENERATIVE DISC DISEASE, CERVICAL SPINE (06/22/2007), Diverticulosis, DYSPHAGIA UNSPECIFIED (09/25/2008), Esophageal stricture, External hemorrhoids, GERD (05/31/2007), GOUT (05/31/2007), Hepatomegaly (09/25/2008), Hiatal hernia, HIP PAIN (03/20/2010), History of echocardiogram, HYPERLIPIDEMIA (03/27/2009), HYPERTENSION (05/31/2007), HYPOTHYROIDISM (05/31/2007), LEFT BUNDLE BRANCH BLOCK (02/11/2009), Liver hemangioma, Liver mass, left lobe, LOC OSTEOARTHROS NOT SPEC PRIM/SEC LOWER LEG (03/20/2010), OSTEOARTHRITIS (05/31/2007),  PERSONAL HX COLONIC POLYPS (03/05/1997), PSA, INCREASED (03/20/2010), SINUSITIS, CHRONIC NOS (05/31/2007), Sore throat, SYNCOPE (11/19/2008), and WEIGHT LOSS, RECENT (11/19/2008).   He has a past surgical history that includes pul. colapse w/chest tube; Tonsillectomy; carpel tunnel release; ORIF ulnar fracture (11/03/2012); Eye surgery; Inguinal hernia repair (12/18/2015); Inguinal hernia repair (Right, 12/18/2015); Insertion of mesh (Right, 12/18/2015); and Cardiac catheterization.   His family history includes Alcohol abuse in his father; Cancer in his brother and mother; Diabetes in his brother, sister, and sister; Lung disease in his father.He reports that he quit smoking about 23 years ago. His smoking use included cigarettes. He has never used smokeless tobacco. He reports current alcohol use of about 5.0 standard drinks of alcohol per week. He reports that he does not use drugs.    ROS Review of Systems  Objective:  BP 116/67   Pulse 84   Temp 97.8 F (36.6 C)   Ht 5\' 7"  (1.702 m)   Wt 124 lb 6.4 oz (56.4 kg)   SpO2 96%   BMI 19.48 kg/m   BP Readings from Last 3 Encounters:  06/19/21 116/67  05/19/21 (!) 107/51  04/25/21 129/63    Wt Readings from Last 3 Encounters:  06/19/21 124 lb 6.4 oz (56.4 kg)  05/19/21 128 lb (58.1 kg)  04/25/21 125 lb 12.8 oz (57.1 kg)     Physical Exam    Assessment & Plan:   Younis was seen today for follow-up.  Diagnoses and all orders for this visit:  Gastroesophageal reflux disease with esophagitis without hemorrhage  Hypothyroidism, unspecified type  Acute bronchitis due to Rhinovirus  Essential hypertension       I am having Coralyn Pear. Dorough maintain his aspirin EC, Cyanocobalamin (VITAMIN B-12 PO), PreserVision AREDS, famotidine, ProAir  RespiClick, pantoprazole, levothyroxine, olmesartan, diclofenac, ALPRAZolam, silodosin, albuterol, and Linzess.  Allergies as of 06/19/2021       Reactions   Amoxicillin Swelling, Other  (See Comments)   Pt has swelling with PCN's   Penicillins Swelling   Lips swelling Has patient had a PCN reaction causing immediate rash, facial/tongue/throat swelling, SOB or lightheadedness with hypotension: No Has patient had a PCN reaction causing severe rash involving mucus membranes or skin necrosis: No Has patient had a PCN reaction that required hospitalization No Has patient had a PCN reaction occurring within the last 10 years: No If all of the above answers are "NO", then may proceed with Cephalosporin use.   Ceclor [cefaclor] Other (See Comments)   Pt does not remember reaction   Celebrex [celecoxib] Other (See Comments)   Pt does not remember reaction   Levaquin [levofloxacin In D5w] Other (See Comments)   Pt does not remember reaction        Medication List        Accurate as of June 19, 2021  9:42 AM. If you have any questions, ask your nurse or doctor.          ALPRAZolam 0.5 MG tablet Commonly known as: XANAX Take 1 tablet (0.5 mg total) by mouth at bedtime. Only on Mondays and Thursdays   aspirin EC 81 MG tablet Take 81 mg by mouth daily.   diclofenac 75 MG EC tablet Commonly known as: VOLTAREN TAKE 1 TABLET BY MOUTH  TWICE DAILY AS NEEDED FOR  JOINT OR MUSCLE PAIN   famotidine 40 MG tablet Commonly known as: Pepcid Take 1 tablet (40 mg total) by mouth at bedtime.   levothyroxine 88 MCG tablet Commonly known as: SYNTHROID Take 1 tablet (88 mcg total) by mouth daily before breakfast. On an empty stomach   Linzess 72 MCG capsule Generic drug: linaclotide TAKE 1 CAPSULE DAILY BEFORE BREAKFAST   olmesartan 20 MG tablet Commonly known as: BENICAR Take 0.5 tablets (10 mg total) by mouth daily.   pantoprazole 40 MG tablet Commonly known as: PROTONIX TAKE 1 TABLET BY MOUTH  DAILY   PreserVision AREDS Caps Take by mouth.   ProAir RespiClick 678 (90 Base) MCG/ACT Aepb Generic drug: Albuterol Sulfate Inhale 2 puffs into the lungs 4 (four) times  daily as needed (shortness of breath).   albuterol 108 (90 Base) MCG/ACT inhaler Commonly known as: VENTOLIN HFA INHALE 2 PUFFS EVERY 6 HOURS AS NEEDED FOR WHEEZING OR SHORTNESS OF BREATH   silodosin 4 MG Caps capsule Commonly known as: RAPAFLO TAKE 1 CAPSULE ONCE A DAY   VITAMIN B-12 PO Take by mouth.        GI referral has been approved and is awaiting scheduling.  The patient was advised to call the gastroenterology service directly.  Our referrals coordinator tells me that she reached out on Mr. Osama behalf and was told that due to short staffing GI has been behind on reaching out to schedule patients. Follow-up: No follow-ups on file.  [name], M.D.

## 2021-07-17 ENCOUNTER — Other Ambulatory Visit: Payer: Self-pay | Admitting: Family Medicine

## 2021-07-17 DIAGNOSIS — R350 Frequency of micturition: Secondary | ICD-10-CM

## 2021-07-17 DIAGNOSIS — N401 Enlarged prostate with lower urinary tract symptoms: Secondary | ICD-10-CM

## 2021-07-30 ENCOUNTER — Other Ambulatory Visit: Payer: Self-pay | Admitting: Internal Medicine

## 2021-07-30 DIAGNOSIS — K2100 Gastro-esophageal reflux disease with esophagitis, without bleeding: Secondary | ICD-10-CM

## 2021-08-18 ENCOUNTER — Other Ambulatory Visit: Payer: Self-pay | Admitting: Family Medicine

## 2021-08-18 DIAGNOSIS — F411 Generalized anxiety disorder: Secondary | ICD-10-CM

## 2021-08-18 NOTE — Telephone Encounter (Signed)
I denied refill earlier to for Alprazolam, refill was done 05/19/21 #30 with 4 refills but this was not received by pharmacy, it was set to no print Please advise

## 2021-08-18 NOTE — Addendum Note (Signed)
Addended by: Antonietta Barcelona D on: 08/18/2021 02:38 PM   Modules accepted: Orders

## 2021-08-19 ENCOUNTER — Other Ambulatory Visit: Payer: Self-pay | Admitting: Family Medicine

## 2021-08-19 DIAGNOSIS — F411 Generalized anxiety disorder: Secondary | ICD-10-CM

## 2021-08-19 MED ORDER — ALPRAZOLAM 0.5 MG PO TABS: 0.5000 mg | ORAL_TABLET | Freq: Every day | ORAL | 2 refills | Status: DC

## 2021-08-21 ENCOUNTER — Ambulatory Visit: Payer: Medicare Other | Admitting: Family Medicine

## 2021-09-18 ENCOUNTER — Other Ambulatory Visit: Payer: Self-pay

## 2021-09-18 ENCOUNTER — Emergency Department (HOSPITAL_BASED_OUTPATIENT_CLINIC_OR_DEPARTMENT_OTHER): Payer: Medicare Other | Admitting: Radiology

## 2021-09-18 ENCOUNTER — Encounter (HOSPITAL_BASED_OUTPATIENT_CLINIC_OR_DEPARTMENT_OTHER): Payer: Self-pay

## 2021-09-18 ENCOUNTER — Inpatient Hospital Stay (HOSPITAL_BASED_OUTPATIENT_CLINIC_OR_DEPARTMENT_OTHER)
Admission: EM | Admit: 2021-09-18 | Discharge: 2021-09-26 | DRG: 291 | Disposition: A | Discharge status: Home Health | Payer: Medicare Other | Attending: Internal Medicine | Admitting: Internal Medicine

## 2021-09-18 DIAGNOSIS — E785 Hyperlipidemia, unspecified: Secondary | ICD-10-CM | POA: Diagnosis present

## 2021-09-18 DIAGNOSIS — I4892 Unspecified atrial flutter: Secondary | ICD-10-CM | POA: Diagnosis present

## 2021-09-18 DIAGNOSIS — R0602 Shortness of breath: Secondary | ICD-10-CM | POA: Diagnosis not present

## 2021-09-18 DIAGNOSIS — R091 Pleurisy: Secondary | ICD-10-CM | POA: Diagnosis not present

## 2021-09-18 DIAGNOSIS — I5033 Acute on chronic diastolic (congestive) heart failure: Secondary | ICD-10-CM | POA: Diagnosis present

## 2021-09-18 DIAGNOSIS — Z881 Allergy status to other antibiotic agents status: Secondary | ICD-10-CM

## 2021-09-18 DIAGNOSIS — G621 Alcoholic polyneuropathy: Secondary | ICD-10-CM | POA: Diagnosis present

## 2021-09-18 DIAGNOSIS — E871 Hypo-osmolality and hyponatremia: Secondary | ICD-10-CM | POA: Diagnosis not present

## 2021-09-18 DIAGNOSIS — I10 Essential (primary) hypertension: Secondary | ICD-10-CM | POA: Diagnosis not present

## 2021-09-18 DIAGNOSIS — I2510 Atherosclerotic heart disease of native coronary artery without angina pectoris: Secondary | ICD-10-CM | POA: Diagnosis not present

## 2021-09-18 DIAGNOSIS — I248 Other forms of acute ischemic heart disease: Secondary | ICD-10-CM | POA: Diagnosis not present

## 2021-09-18 DIAGNOSIS — I480 Paroxysmal atrial fibrillation: Secondary | ICD-10-CM | POA: Diagnosis present

## 2021-09-18 DIAGNOSIS — M109 Gout, unspecified: Secondary | ICD-10-CM | POA: Diagnosis not present

## 2021-09-18 DIAGNOSIS — N179 Acute kidney failure, unspecified: Secondary | ICD-10-CM | POA: Diagnosis not present

## 2021-09-18 DIAGNOSIS — D539 Nutritional anemia, unspecified: Secondary | ICD-10-CM | POA: Diagnosis not present

## 2021-09-18 DIAGNOSIS — K5090 Crohn's disease, unspecified, without complications: Secondary | ICD-10-CM | POA: Diagnosis present

## 2021-09-18 DIAGNOSIS — K219 Gastro-esophageal reflux disease without esophagitis: Secondary | ICD-10-CM | POA: Diagnosis not present

## 2021-09-18 DIAGNOSIS — J9811 Atelectasis: Secondary | ICD-10-CM | POA: Diagnosis not present

## 2021-09-18 DIAGNOSIS — Z7189 Other specified counseling: Secondary | ICD-10-CM

## 2021-09-18 DIAGNOSIS — D696 Thrombocytopenia, unspecified: Secondary | ICD-10-CM | POA: Diagnosis present

## 2021-09-18 DIAGNOSIS — F411 Generalized anxiety disorder: Secondary | ICD-10-CM | POA: Diagnosis present

## 2021-09-18 DIAGNOSIS — D509 Iron deficiency anemia, unspecified: Secondary | ICD-10-CM | POA: Diagnosis present

## 2021-09-18 DIAGNOSIS — E039 Hypothyroidism, unspecified: Secondary | ICD-10-CM | POA: Diagnosis not present

## 2021-09-18 DIAGNOSIS — E782 Mixed hyperlipidemia: Secondary | ICD-10-CM | POA: Diagnosis not present

## 2021-09-18 DIAGNOSIS — I5041 Acute combined systolic (congestive) and diastolic (congestive) heart failure: Secondary | ICD-10-CM | POA: Diagnosis present

## 2021-09-18 DIAGNOSIS — Z20822 Contact with and (suspected) exposure to covid-19: Secondary | ICD-10-CM | POA: Diagnosis not present

## 2021-09-18 DIAGNOSIS — Z48813 Encounter for surgical aftercare following surgery on the respiratory system: Secondary | ICD-10-CM | POA: Diagnosis not present

## 2021-09-18 DIAGNOSIS — I110 Hypertensive heart disease with heart failure: Principal | ICD-10-CM | POA: Diagnosis present

## 2021-09-18 DIAGNOSIS — J918 Pleural effusion in other conditions classified elsewhere: Secondary | ICD-10-CM | POA: Diagnosis not present

## 2021-09-18 DIAGNOSIS — I5021 Acute systolic (congestive) heart failure: Secondary | ICD-10-CM | POA: Diagnosis not present

## 2021-09-18 DIAGNOSIS — I509 Heart failure, unspecified: Secondary | ICD-10-CM | POA: Diagnosis not present

## 2021-09-18 DIAGNOSIS — R079 Chest pain, unspecified: Secondary | ICD-10-CM | POA: Diagnosis not present

## 2021-09-18 DIAGNOSIS — E222 Syndrome of inappropriate secretion of antidiuretic hormone: Secondary | ICD-10-CM | POA: Diagnosis not present

## 2021-09-18 DIAGNOSIS — J90 Pleural effusion, not elsewhere classified: Secondary | ICD-10-CM

## 2021-09-18 DIAGNOSIS — Z888 Allergy status to other drugs, medicaments and biological substances status: Secondary | ICD-10-CM

## 2021-09-18 DIAGNOSIS — I501 Left ventricular failure: Secondary | ICD-10-CM | POA: Diagnosis not present

## 2021-09-18 DIAGNOSIS — Z87891 Personal history of nicotine dependence: Secondary | ICD-10-CM

## 2021-09-18 DIAGNOSIS — R160 Hepatomegaly, not elsewhere classified: Secondary | ICD-10-CM | POA: Diagnosis not present

## 2021-09-18 DIAGNOSIS — E8779 Other fluid overload: Secondary | ICD-10-CM | POA: Diagnosis not present

## 2021-09-18 DIAGNOSIS — I5043 Acute on chronic combined systolic (congestive) and diastolic (congestive) heart failure: Secondary | ICD-10-CM | POA: Diagnosis present

## 2021-09-18 DIAGNOSIS — I255 Ischemic cardiomyopathy: Secondary | ICD-10-CM | POA: Diagnosis present

## 2021-09-18 DIAGNOSIS — R109 Unspecified abdominal pain: Secondary | ICD-10-CM | POA: Diagnosis not present

## 2021-09-18 DIAGNOSIS — K5790 Diverticulosis of intestine, part unspecified, without perforation or abscess without bleeding: Secondary | ICD-10-CM | POA: Diagnosis present

## 2021-09-18 DIAGNOSIS — D518 Other vitamin B12 deficiency anemias: Secondary | ICD-10-CM | POA: Diagnosis present

## 2021-09-18 DIAGNOSIS — Z880 Allergy status to penicillin: Secondary | ICD-10-CM

## 2021-09-18 DIAGNOSIS — Z7982 Long term (current) use of aspirin: Secondary | ICD-10-CM

## 2021-09-18 DIAGNOSIS — Z66 Do not resuscitate: Secondary | ICD-10-CM | POA: Diagnosis not present

## 2021-09-18 DIAGNOSIS — E538 Deficiency of other specified B group vitamins: Secondary | ICD-10-CM | POA: Diagnosis present

## 2021-09-18 DIAGNOSIS — N400 Enlarged prostate without lower urinary tract symptoms: Secondary | ICD-10-CM | POA: Diagnosis present

## 2021-09-18 DIAGNOSIS — Z79890 Hormone replacement therapy (postmenopausal): Secondary | ICD-10-CM

## 2021-09-18 DIAGNOSIS — Z79899 Other long term (current) drug therapy: Secondary | ICD-10-CM

## 2021-09-18 DIAGNOSIS — M1990 Unspecified osteoarthritis, unspecified site: Secondary | ICD-10-CM | POA: Diagnosis present

## 2021-09-18 DIAGNOSIS — I959 Hypotension, unspecified: Secondary | ICD-10-CM | POA: Diagnosis not present

## 2021-09-18 DIAGNOSIS — I447 Left bundle-branch block, unspecified: Secondary | ICD-10-CM | POA: Diagnosis present

## 2021-09-18 LAB — CBC
HCT: 35.5 % — ABNORMAL LOW (ref 39.0–52.0)
Hemoglobin: 11.9 g/dL — ABNORMAL LOW (ref 13.0–17.0)
MCH: 34.9 pg — ABNORMAL HIGH (ref 26.0–34.0)
MCHC: 33.5 g/dL (ref 30.0–36.0)
MCV: 104.1 fL — ABNORMAL HIGH (ref 80.0–100.0)
Platelets: 120 10*3/uL — ABNORMAL LOW (ref 150–400)
RBC: 3.41 MIL/uL — ABNORMAL LOW (ref 4.22–5.81)
RDW: 14.7 % (ref 11.5–15.5)
WBC: 7 10*3/uL (ref 4.0–10.5)
nRBC: 0 % (ref 0.0–0.2)

## 2021-09-18 LAB — BASIC METABOLIC PANEL
Anion gap: 9 (ref 5–15)
BUN: 18 mg/dL (ref 8–23)
CO2: 24 mmol/L (ref 22–32)
Calcium: 9.3 mg/dL (ref 8.9–10.3)
Chloride: 99 mmol/L (ref 98–111)
Creatinine, Ser: 1.06 mg/dL (ref 0.61–1.24)
GFR, Estimated: >60 mL/min (ref >60)
Glucose, Bld: 112 mg/dL — ABNORMAL HIGH (ref 70–99)
Potassium: 4.6 mmol/L (ref 3.5–5.1)
Sodium: 132 mmol/L — ABNORMAL LOW (ref 135–145)

## 2021-09-18 LAB — TROPONIN I (HIGH SENSITIVITY): Troponin I (High Sensitivity): 93 ng/L — ABNORMAL HIGH (ref <18)

## 2021-09-18 NOTE — ED Triage Notes (Signed)
Patient here POV from Home with SOB.  Patient endorses SOB for approximately 1 week. Patient also endorses increased use of Albuterol Inhaler with decreased effectiveness.  Patient endorses Hiatal Hernia History. No Definite CP.   BIB Wheelchair. NAD Noted during Triage. A&Ox4. GCS 15.

## 2021-09-19 ENCOUNTER — Emergency Department (HOSPITAL_BASED_OUTPATIENT_CLINIC_OR_DEPARTMENT_OTHER): Payer: Medicare Other

## 2021-09-19 ENCOUNTER — Encounter (HOSPITAL_BASED_OUTPATIENT_CLINIC_OR_DEPARTMENT_OTHER): Payer: Self-pay | Admitting: Radiology

## 2021-09-19 DIAGNOSIS — I5043 Acute on chronic combined systolic (congestive) and diastolic (congestive) heart failure: Secondary | ICD-10-CM | POA: Diagnosis present

## 2021-09-19 DIAGNOSIS — I5033 Acute on chronic diastolic (congestive) heart failure: Secondary | ICD-10-CM | POA: Diagnosis present

## 2021-09-19 DIAGNOSIS — I2510 Atherosclerotic heart disease of native coronary artery without angina pectoris: Secondary | ICD-10-CM | POA: Diagnosis present

## 2021-09-19 DIAGNOSIS — G621 Alcoholic polyneuropathy: Secondary | ICD-10-CM | POA: Diagnosis present

## 2021-09-19 DIAGNOSIS — N179 Acute kidney failure, unspecified: Secondary | ICD-10-CM | POA: Diagnosis not present

## 2021-09-19 DIAGNOSIS — R0602 Shortness of breath: Secondary | ICD-10-CM | POA: Diagnosis not present

## 2021-09-19 DIAGNOSIS — D509 Iron deficiency anemia, unspecified: Secondary | ICD-10-CM | POA: Diagnosis present

## 2021-09-19 DIAGNOSIS — Z20822 Contact with and (suspected) exposure to covid-19: Secondary | ICD-10-CM | POA: Diagnosis present

## 2021-09-19 DIAGNOSIS — I248 Other forms of acute ischemic heart disease: Secondary | ICD-10-CM | POA: Diagnosis present

## 2021-09-19 DIAGNOSIS — I480 Paroxysmal atrial fibrillation: Secondary | ICD-10-CM | POA: Diagnosis present

## 2021-09-19 DIAGNOSIS — I5041 Acute combined systolic (congestive) and diastolic (congestive) heart failure: Secondary | ICD-10-CM | POA: Diagnosis not present

## 2021-09-19 DIAGNOSIS — D696 Thrombocytopenia, unspecified: Secondary | ICD-10-CM | POA: Diagnosis present

## 2021-09-19 DIAGNOSIS — I959 Hypotension, unspecified: Secondary | ICD-10-CM | POA: Diagnosis present

## 2021-09-19 DIAGNOSIS — E871 Hypo-osmolality and hyponatremia: Secondary | ICD-10-CM | POA: Diagnosis not present

## 2021-09-19 DIAGNOSIS — J918 Pleural effusion in other conditions classified elsewhere: Secondary | ICD-10-CM | POA: Diagnosis present

## 2021-09-19 DIAGNOSIS — M109 Gout, unspecified: Secondary | ICD-10-CM | POA: Diagnosis present

## 2021-09-19 DIAGNOSIS — I10 Essential (primary) hypertension: Secondary | ICD-10-CM

## 2021-09-19 DIAGNOSIS — I4892 Unspecified atrial flutter: Secondary | ICD-10-CM | POA: Diagnosis present

## 2021-09-19 DIAGNOSIS — I509 Heart failure, unspecified: Secondary | ICD-10-CM | POA: Diagnosis not present

## 2021-09-19 DIAGNOSIS — F411 Generalized anxiety disorder: Secondary | ICD-10-CM | POA: Diagnosis present

## 2021-09-19 DIAGNOSIS — E039 Hypothyroidism, unspecified: Secondary | ICD-10-CM

## 2021-09-19 DIAGNOSIS — E785 Hyperlipidemia, unspecified: Secondary | ICD-10-CM | POA: Diagnosis present

## 2021-09-19 DIAGNOSIS — E222 Syndrome of inappropriate secretion of antidiuretic hormone: Secondary | ICD-10-CM | POA: Diagnosis present

## 2021-09-19 DIAGNOSIS — I110 Hypertensive heart disease with heart failure: Secondary | ICD-10-CM | POA: Diagnosis present

## 2021-09-19 DIAGNOSIS — J9811 Atelectasis: Secondary | ICD-10-CM | POA: Diagnosis not present

## 2021-09-19 DIAGNOSIS — J90 Pleural effusion, not elsewhere classified: Secondary | ICD-10-CM | POA: Diagnosis not present

## 2021-09-19 DIAGNOSIS — K5090 Crohn's disease, unspecified, without complications: Secondary | ICD-10-CM | POA: Diagnosis present

## 2021-09-19 DIAGNOSIS — I501 Left ventricular failure: Secondary | ICD-10-CM | POA: Diagnosis not present

## 2021-09-19 DIAGNOSIS — E538 Deficiency of other specified B group vitamins: Secondary | ICD-10-CM | POA: Diagnosis present

## 2021-09-19 DIAGNOSIS — I5021 Acute systolic (congestive) heart failure: Secondary | ICD-10-CM | POA: Diagnosis not present

## 2021-09-19 DIAGNOSIS — E782 Mixed hyperlipidemia: Secondary | ICD-10-CM | POA: Diagnosis not present

## 2021-09-19 DIAGNOSIS — K5790 Diverticulosis of intestine, part unspecified, without perforation or abscess without bleeding: Secondary | ICD-10-CM | POA: Diagnosis present

## 2021-09-19 DIAGNOSIS — D539 Nutritional anemia, unspecified: Secondary | ICD-10-CM | POA: Diagnosis present

## 2021-09-19 DIAGNOSIS — Z66 Do not resuscitate: Secondary | ICD-10-CM | POA: Diagnosis present

## 2021-09-19 DIAGNOSIS — Z7189 Other specified counseling: Secondary | ICD-10-CM | POA: Diagnosis not present

## 2021-09-19 DIAGNOSIS — K219 Gastro-esophageal reflux disease without esophagitis: Secondary | ICD-10-CM | POA: Diagnosis present

## 2021-09-19 LAB — TROPONIN I (HIGH SENSITIVITY): Troponin I (High Sensitivity): 115 ng/L (ref <18)

## 2021-09-19 LAB — HEPATIC FUNCTION PANEL
ALT: 9 U/L (ref 0–44)
AST: 12 U/L — ABNORMAL LOW (ref 15–41)
Albumin: 3.1 g/dL — ABNORMAL LOW (ref 3.5–5.0)
Alkaline Phosphatase: 48 U/L (ref 38–126)
Bilirubin, Direct: 0.2 mg/dL (ref 0.0–0.2)
Indirect Bilirubin: 0.6 mg/dL (ref 0.3–0.9)
Total Bilirubin: 0.8 mg/dL (ref 0.3–1.2)
Total Protein: 5.6 g/dL — ABNORMAL LOW (ref 6.5–8.1)

## 2021-09-19 LAB — BRAIN NATRIURETIC PEPTIDE: B Natriuretic Peptide: 1425.5 pg/mL — ABNORMAL HIGH (ref 0.0–100.0)

## 2021-09-19 LAB — RESP PANEL BY RT-PCR (FLU A&B, COVID) ARPGX2
Influenza A by PCR: NEGATIVE
Influenza B by PCR: NEGATIVE
SARS Coronavirus 2 by RT PCR: NEGATIVE

## 2021-09-19 MED ORDER — SODIUM CHLORIDE 0.9% FLUSH
3.0000 mL | Freq: Two times a day (BID) | INTRAVENOUS | Status: DC
  Administered 2021-09-19 – 2021-09-22 (×6): 3 mL via INTRAVENOUS

## 2021-09-19 MED ORDER — FUROSEMIDE 10 MG/ML IJ SOLN
20.0000 mg | Freq: Once | INTRAMUSCULAR | Status: AC
  Administered 2021-09-19: 20 mg via INTRAVENOUS
  Filled 2021-09-19: qty 2

## 2021-09-19 MED ORDER — TAMSULOSIN HCL 0.4 MG PO CAPS
0.4000 mg | ORAL_CAPSULE | Freq: Every day | ORAL | Status: DC
  Administered 2021-09-19 – 2021-09-26 (×8): 0.4 mg via ORAL
  Filled 2021-09-19 (×8): qty 1

## 2021-09-19 MED ORDER — SODIUM CHLORIDE 0.9% FLUSH: 3.0000 mL | INTRAVENOUS | Status: DC | PRN

## 2021-09-19 MED ORDER — SODIUM CHLORIDE 0.9 % IV SOLN: 250.0000 mL | INTRAVENOUS | Status: DC | PRN

## 2021-09-19 MED ORDER — IOHEXOL 350 MG/ML SOLN
100.0000 mL | Freq: Once | INTRAVENOUS | Status: AC | PRN
  Administered 2021-09-19: 75 mL via INTRAVENOUS

## 2021-09-19 MED ORDER — LINACLOTIDE 145 MCG PO CAPS
145.0000 ug | ORAL_CAPSULE | Freq: Every day | ORAL | Status: DC
  Administered 2021-09-21 – 2021-09-26 (×4): 145 ug via ORAL
  Filled 2021-09-19 (×10): qty 1

## 2021-09-19 MED ORDER — ALBUTEROL SULFATE HFA 108 (90 BASE) MCG/ACT IN AERS
2.0000 | INHALATION_SPRAY | Freq: Four times a day (QID) | RESPIRATORY_TRACT | Status: DC | PRN
  Administered 2021-09-20: 2 via RESPIRATORY_TRACT
  Filled 2021-09-19: qty 6.7

## 2021-09-19 MED ORDER — ALPRAZOLAM 0.5 MG PO TABS
0.5000 mg | ORAL_TABLET | Freq: Every evening | ORAL | Status: DC | PRN
  Administered 2021-09-19 – 2021-09-25 (×7): 0.5 mg via ORAL
  Filled 2021-09-19 (×7): qty 1

## 2021-09-19 MED ORDER — LEVOTHYROXINE SODIUM 88 MCG PO TABS: 88.0000 ug | ORAL_TABLET | Freq: Every day | ORAL | Status: DC

## 2021-09-19 MED ORDER — PANTOPRAZOLE SODIUM 40 MG PO TBEC
40.0000 mg | DELAYED_RELEASE_TABLET | Freq: Every day | ORAL | Status: DC
  Administered 2021-09-19 – 2021-09-26 (×8): 40 mg via ORAL
  Filled 2021-09-19 (×8): qty 1

## 2021-09-19 MED ORDER — ONDANSETRON HCL 4 MG/2ML IJ SOLN: 4.0000 mg | Freq: Four times a day (QID) | INTRAMUSCULAR | Status: DC | PRN

## 2021-09-19 MED ORDER — IPRATROPIUM-ALBUTEROL 0.5-2.5 (3) MG/3ML IN SOLN
3.0000 mL | Freq: Four times a day (QID) | RESPIRATORY_TRACT | Status: DC | PRN
  Administered 2021-09-19: 3 mL via RESPIRATORY_TRACT
  Filled 2021-09-19: qty 3

## 2021-09-19 MED ORDER — INFLUENZA VAC A&B SA ADJ QUAD 0.5 ML IM PRSY
0.5000 mL | PREFILLED_SYRINGE | INTRAMUSCULAR | Status: DC
  Filled 2021-09-19: qty 0.5

## 2021-09-19 MED ORDER — ACETAMINOPHEN 325 MG PO TABS
650.0000 mg | ORAL_TABLET | ORAL | Status: DC | PRN
  Filled 2021-09-19: qty 2

## 2021-09-19 MED ORDER — LEVOTHYROXINE SODIUM 88 MCG PO TABS
88.0000 ug | ORAL_TABLET | Freq: Every day | ORAL | Status: DC
  Administered 2021-09-20 – 2021-09-23 (×4): 88 ug via ORAL
  Filled 2021-09-19 (×4): qty 1

## 2021-09-19 MED ORDER — ENOXAPARIN SODIUM 30 MG/0.3ML IJ SOSY
30.0000 mg | PREFILLED_SYRINGE | INTRAMUSCULAR | Status: DC
  Administered 2021-09-19: 30 mg via SUBCUTANEOUS
  Filled 2021-09-19: qty 0.3

## 2021-09-19 MED ORDER — VITAMIN B-12 100 MCG PO TABS
100.0000 ug | ORAL_TABLET | Freq: Every day | ORAL | Status: DC
  Administered 2021-09-20 – 2021-09-26 (×7): 100 ug via ORAL
  Filled 2021-09-19 (×7): qty 1

## 2021-09-19 MED ORDER — ALPRAZOLAM 0.5 MG PO TABS
0.5000 mg | ORAL_TABLET | Freq: Every day | ORAL | Status: DC
  Administered 2021-09-19: 0.5 mg via ORAL
  Filled 2021-09-19: qty 1

## 2021-09-19 MED ORDER — ASPIRIN EC 81 MG PO TBEC
81.0000 mg | DELAYED_RELEASE_TABLET | Freq: Every day | ORAL | Status: DC
  Administered 2021-09-19 – 2021-09-22 (×4): 81 mg via ORAL
  Filled 2021-09-19 (×4): qty 1

## 2021-09-19 MED ORDER — FUROSEMIDE 10 MG/ML IJ SOLN
20.0000 mg | Freq: Every day | INTRAMUSCULAR | Status: DC
  Administered 2021-09-20 – 2021-09-22 (×3): 20 mg via INTRAVENOUS
  Filled 2021-09-19 (×3): qty 2

## 2021-09-19 MED ORDER — ALPRAZOLAM 0.5 MG PO TABS: 0.5000 mg | ORAL_TABLET | Freq: Every day | ORAL | Status: DC

## 2021-09-19 NOTE — H&P (Signed)
History and Physical    Albert Allen JSE:[id number] DOB: [birth year] DOA: 09/18/2021  PCP: [name], MD  Patient coming from:  Home.   I have personally briefly reviewed patient's old medical records in [place]  Chief Complaint: sob since one week.   HPI: Albert Allen is a 87 y.o. male with medical history significant of anemia, alcoholic polyneuropathy, COPD, hypertension, hypothyroidism,LBBB, gerd presents to ED for sob for one week. He denies any chest pain, reports feeling sob and some tightness on ambulation. No fevers or chills, occasional cough present. Pt denies any nausea, vomiting , hematochezia or hematemesis.he denies any syncope or orthopnea or pND. He denies any  pedal edema. He was tested negative for COVID. He ws seen in ED at [facility]. Labs revealed sodium of 132, albumin of 3.1, bnp of 1425, mildly elevated troponin of 93 and 115 and hemoglobin of 11.9 and platelets of 120,000. COVID test is negative. CXR showed small bilateral effusions. CT angio of the chest showed large bilateral effusions with minimal atelectasis. No PE.  He was referred to [facility] for admission for CHF exacerbation.      Review of Systems: As per HPI otherwise "All others reviewed and are negative,"  Past Medical History:  Diagnosis Date   ABNORMAL CV (STRESS) TEST 03/05/2009   ABUSE, ALCOHOL, IN REMISSION 05/31/2007   Adhesive capsulitis of shoulder 0/73/7106   Alcoholic polyneuropathy ([place]) 03/29/2008   ANEMIA, B12 DEFICIENCY 03/29/2008   Anxiety state, unspecified 11/19/2008   ASTHMA 05/31/2007   Asthma    CAD, NATIVE VESSEL 04/04/2009   CARPAL TUNNEL SYNDROME, RIGHT 09/05/2007   Cataract    removed bilaterally   Chronic prostatitis 03/29/2008   COPD (chronic obstructive pulmonary disease) ([place])    "they told me I have a touch of COPD"   Crohn's disease ([place])    Degenerative disc disease, cervical    DEGENERATIVE DISC DISEASE, CERVICAL SPINE 06/22/2007   Diverticulosis     DYSPHAGIA UNSPECIFIED 09/25/2008   Esophageal stricture    External hemorrhoids    GERD 05/31/2007   GOUT 05/31/2007   Hepatomegaly 09/25/2008   Hiatal hernia    HIP PAIN 03/20/2010   History of echocardiogram    Echo 4/17 - mild LVH, EF 50-55%, Gr 1 DD, trivial AI, MAC, mild MR   HYPERLIPIDEMIA 03/27/2009   HYPERTENSION 05/31/2007   HYPOTHYROIDISM 05/31/2007   LEFT BUNDLE BRANCH BLOCK 02/11/2009   Liver hemangioma    Liver mass, left lobe    LOC OSTEOARTHROS NOT SPEC PRIM/SEC LOWER LEG 03/20/2010   OSTEOARTHRITIS 05/31/2007   PERSONAL HX COLONIC POLYPS 03/05/1997   adenomatous    PSA, INCREASED 03/20/2010   SINUSITIS, CHRONIC NOS 05/31/2007   Sore throat    CURRENTLY   SYNCOPE 11/19/2008   WEIGHT LOSS, RECENT 11/19/2008    Past Surgical History:  Procedure Laterality Date   CARDIAC CATHETERIZATION     carpel tunnel release     EYE SURGERY     INGUINAL HERNIA REPAIR  12/18/2015   INGUINAL HERNIA REPAIR Right 12/18/2015   Procedure: OPEN REPAIR RIGHT INGUINAL HERNIA ;  Surgeon: Greer Pickerel, MD;  Location: [place];  Service: General;  Laterality: Right;   INSERTION OF MESH Right 12/18/2015   Procedure: INSERTION OF MESH;  Surgeon: Greer Pickerel, MD;  Location: [place];  Service: General;  Laterality: Right;   ORIF ULNAR FRACTURE  11/03/2012   Procedure: OPEN REDUCTION INTERNAL FIXATION (ORIF) ULNAR FRACTURE;  Surgeon: Hessie Dibble, MD;  Location: [place];  Service: Orthopedics;  Laterality: Right;  ORIF radius and ulnar fractures   pul. colapse w/chest tube     TONSILLECTOMY      Social History  reports that he quit smoking about 23 years ago. His smoking use included cigarettes. He has never used smokeless tobacco. He reports current alcohol use of about 5.0 standard drinks per week. He reports that he does not use drugs.  Allergies  Allergen Reactions   Amoxicillin Swelling and Other (See Comments)    Pt has swelling with PCN's   Penicillins Swelling    Lips swelling Has patient had a PCN  reaction causing immediate rash, facial/tongue/throat swelling, SOB or lightheadedness with hypotension: No Has patient had a PCN reaction causing severe rash involving mucus membranes or skin necrosis: No Has patient had a PCN reaction that required hospitalization No Has patient had a PCN reaction occurring within the last 10 years: No If all of the above answers are "NO", then may proceed with Cephalosporin use.   Ceclor [Cefaclor] Other (See Comments)    Pt does not remember reaction   Celebrex [Celecoxib] Other (See Comments)    Pt does not remember reaction   Levaquin [Levofloxacin In D5w] Other (See Comments)    Pt does not remember reaction    Family History  Problem Relation Age of Onset   Cancer Mother        In back but unsure of what type of cancer   Alcohol abuse Father    Lung disease Father        abscess   Diabetes Sister    Cancer Brother        throat   Diabetes Brother    Diabetes Sister    Colon cancer Neg Hx    Colon polyps Neg Hx    Esophageal cancer Neg Hx    Rectal cancer Neg Hx    Stomach cancer Neg Hx    Family h/o CHF not present.   Prior to Admission medications   Medication Sig Start Date End Date Taking? Authorizing Provider  albuterol (VENTOLIN HFA) 108 (90 Base) MCG/ACT inhaler INHALE 2 PUFFS EVERY 6 HOURS AS NEEDED FOR WHEEZING OR SHORTNESS OF BREATH 06/16/21   [name], MD  Albuterol Sulfate (PROAIR RESPICLICK) 518 (90 Base) MCG/ACT AEPB Inhale 2 puffs into the lungs 4 (four) times daily as needed (shortness of breath). 11/05/20   [name], MD  ALPRAZolam Duanne Moron) 0.5 MG tablet Take 1 tablet (0.5 mg total) by mouth at bedtime. Only on Mondays and Thursdays 08/19/21   [name], MD  aspirin EC 81 MG tablet Take 81 mg by mouth daily.    [provider]  Cyanocobalamin (VITAMIN B-12 PO) Take by mouth.    [provider]  diclofenac (VOLTAREN) 75 MG EC tablet TAKE 1 TABLET BY MOUTH  TWICE DAILY AS NEEDED FOR  JOINT  OR MUSCLE PAIN 05/07/21   [name], MD  famotidine (PEPCID) 40 MG tablet Take 1 tablet (40 mg total) by mouth at bedtime. 06/19/21   [name], MD  levothyroxine (SYNTHROID) 88 MCG tablet Take 1 tablet (88 mcg total) by mouth daily before breakfast. On an empty stomach 06/19/21   [name], MD  LINZESS 72 MCG capsule TAKE 1 CAPSULE DAILY BEFORE BREAKFAST 06/18/21   Pyrtle, Lajuan Lines, MD  Multiple Vitamins-Minerals (PRESERVISION AREDS) CAPS Take by mouth.    [provider]  olmesartan (BENICAR) 20 MG tablet Take 0.5 tablets (10 mg  total) by mouth daily. 04/25/21   Loman Brooklyn, FNP  pantoprazole (PROTONIX) 40 MG tablet TAKE 1 TABLET BY MOUTH  DAILY 07/31/21   Pyrtle, Lajuan Lines, MD  silodosin (RAPAFLO) 4 MG CAPS capsule TAKE 1 CAPSULE ONCE A DAY 07/17/21   [name], MD    Physical Exam: Vitals:   09/19/21 1600 09/19/21 1615 09/19/21 1719 09/19/21 1920  BP:  111/61 (!) 103/51 127/69  Pulse: (!) 104 73 (!) 54 89  Resp: 15 (!) _0 Temp:  97.7 F (36.5 C) (!) 96.6 F (35.9 C)   TempSrc:  Oral Oral   SpO2: (!) 89% 95% 95% 96%  Weight:   54.8 kg   Height:   _1  (1.702 m)     Constitutional: NAD, calm, comfortable Vitals:   09/19/21 1600 09/19/21 1615 09/19/21 1719 09/19/21 1920  BP:  111/61 (!) 103/51 127/69  Pulse: (!) 104 73 (!) 54 89  Resp: 15 (!) _2 Temp:  97.7 F (36.5 C) (!) 96.6 F (35.9 C)   TempSrc:  Oral Oral   SpO2: (!) 89% 95% 95% 96%  Weight:   54.8 kg   Height:   _3  (1.702 m)    Eyes: PERRL, lids and conjunctivae normal ENMT: Mucous membranes are moist.  Neck: normal, supple, no masses,  Respiratory: diminished air entry at bases.  Cardiovascular: Regular rate and rhythm, trace pedal edema.  Abdomen: no tenderness, no masses palpated. No hepatosplenomegaly. Bowel sounds positive.  Musculoskeletal: no clubbing / cyanosis. . Good ROM, no contractures. Normal muscle tone.  Skin: no rashes, lesions, ulcers. No  induration Neurologic: CN 2-12 grossly intact. Sensation intact, DTR normal.  Psychiatric: Alert and oriented x 3. Anxious. .    Labs on Admission: I have personally reviewed following labs and imaging studies  CBC: Recent Labs  Lab 09/18/21 2108  WBC 7.0  HGB 11.9*  HCT 35.5*  MCV 104.1*  PLT 120*    Basic Metabolic Panel: Recent Labs  Lab 09/18/21 2108  NA 132*  K 4.6  CL 99  CO2 24  GLUCOSE 112*  BUN 18  CREATININE 1.06  CALCIUM 9.3    GFR: Estimated Creatinine Clearance: 31.6 mL/min (by C-G formula based on SCr of 1.06 mg/dL).  Liver Function Tests: Recent Labs  Lab 09/19/21 1858  AST 12*  ALT 9  ALKPHOS 48  BILITOT 0.8  PROT 5.6*  ALBUMIN 3.1*    Urine analysis:    Component Value Date/Time   COLORURINE YELLOW 07/01/2016 1135   APPEARANCEUR Clear 07/26/2019 1128   LABSPEC <1.005 (L) 07/01/2016 1135   PHURINE 6.0 07/01/2016 1135   GLUCOSEU Negative 07/26/2019 1128   HGBUR SMALL (A) 07/01/2016 1135   HGBUR negative 03/29/2008 0752   BILIRUBINUR Negative 07/26/2019 [place] 07/01/2016 1135   PROTEINUR Negative 07/26/2019 [place] 07/01/2016 1135   UROBILINOGEN negative 08/29/2015 0949   UROBILINOGEN 0.2 06/19/2014 0855   NITRITE Negative 07/26/2019 1128   NITRITE NEGATIVE 07/01/2016 1135   LEUKOCYTESUR Negative 07/26/2019 1128    Radiological Exams on Admission: DG Chest 2 View  Result Date: 09/18/2021 CLINICAL DATA:  Chest pain shortness of breath EXAM: CHEST - 2 VIEW COMPARISON:  12/09/2017 FINDINGS: Small left greater than right pleural effusions. Basilar airspace disease. Normal cardiac size. Aortic atherosclerosis. No pneumothorax IMPRESSION: Small left greater than right pleural effusions with basilar atelectasis or pneumonia Electronically Signed   By: Madie Reno.D.  On: 09/18/2021 21:17   CT Angio Chest PE W and/or Wo Contrast  Result Date: 09/19/2021 CLINICAL DATA:  Shortness of breath for  1 week EXAM: CT ANGIOGRAPHY CHEST WITH CONTRAST TECHNIQUE: Multidetector CT imaging of the chest was performed using the standard protocol during bolus administration of intravenous contrast. Multiplanar CT image reconstructions and MIPs were obtained to evaluate the vascular anatomy. CONTRAST:  85m OMNIPAQUE IOHEXOL 350 MG/ML SOLN COMPARISON:  Chest x-ray from the previous day. FINDINGS: Cardiovascular: Atherosclerotic calcifications of the aorta are noted without significant aneurysmal dilatation. The degree of opacification of the aorta is limited precluding evaluation for dissection. The pulmonary artery shows a normal branching pattern without intraluminal filling defect to suggest pulmonary embolism. Coronary calcifications seen. Mediastinum/Nodes: Thoracic inlet is within normal limits. No sizable hilar or mediastinal adenopathy is noted. The esophagus as visualized is within normal limits. Lungs/Pleura: Large bilateral pleural effusions are noted left slightly greater than right. Some basilar atelectasis is noted bilaterally. Emphysematous changes are noted. No focal confluent infiltrate is seen. No pneumothorax is noted. Upper Abdomen: Visualized upper abdomen is unremarkable. Musculoskeletal: Degenerative changes of the thoracic spine are noted. No acute rib abnormality is seen. Review of the MIP images confirms the above findings. IMPRESSION: No evidence of pulmonary emboli. Large bilateral pleural effusions with only minimal atelectasis in the bases. Aortic Atherosclerosis (ICD10-I70.0) and Emphysema (ICD10-J43.9). Electronically Signed   By: MInez CatalinaM.D.   On: 09/19/2021 00:44    EKG: sinus rhythm with left bundle branch block.   Assessment/Plan Active Problems:   Hypothyroidism   Hyperlipidemia   ANEMIA, B12 DEFICIENCY   Alcoholic polyneuropathy (HCC)   Essential hypertension   GERD   Acute on chronic diastolic CHF (congestive heart failure) (HCC)    Bilateral large pleural  effusions  probably from acute on chronic CHF exacerbation.  - differential include pneumonia vs cirrhosis.  - start on low dose IV lasix 20 mg daily  as pts BP parameters are borderline.  - strict intake and output.  - daily weights.  - echocardiogram ordered.  - holding ARB/ ACE Inhibitors for borderline BP.  - cardiology will be consulted in am.  - IR consulted for thoracentesis and pleural fluid to be sent for analysis.  - pt on RA with good sats and no pedal edema.    Elevated troponins from demand ischemia from acute diastolic CHF.   Pt denies any chest pain or chest tightness at this time.     Abdominal discomfort:  Liver panel ordered. UKoreaabdomen.  UA .    Hypothyroidism:  Resume synthroid. Get tsh.    COPD:  No wheezing heard on exam.  Signs of emphysema on CT chest .  Duonebs ordered.  Pt on RA with good sats.   Anxiety:  Resume xanax.    GERD:  Resume protonix.    Macrocytic anemia:  Will get anemia panel.    Hyponatremia:  ? Fluid overload.    Mild thrombocytopenia:  Monitor platelets.     DVT prophylaxis: (Lovenox) Code Status:   DNR Family Communication:  FAMILY AT BEDSIDE.  Disposition Plan:   Patient is from:  Home   Anticipated DC to:  Home.   Anticipated DC date:  09/22/21  Anticipated DC barriers: CHF.  Consults called:  Cardiology.  Admission status:  Inpatient, tele  Severity of Illness: The appropriate patient status for this patient is INPATIENT. Inpatient status is judged to be reasonable and necessary in order to provide the required intensity  of service to ensure the patient's safety. The patient's presenting symptoms, physical exam findings, and initial radiographic and laboratory data in the context of their chronic comorbidities is felt to place them at high risk for further clinical deterioration. Furthermore, it is not anticipated that the patient will be medically stable for discharge from the hospital within 2 midnights  of admission.   * I certify that at the point of admission it is my clinical judgment that the patient will require inpatient hospital care spanning beyond 2 midnights from the point of admission due to high intensity of service, high risk for further deterioration and high frequency of surveillance required.*    Hosie Poisson MD [facility]  How to contact the [facility] Attending or Consulting provider [place] or covering provider during after hours [place], for this patient?   Check the care team in [facility] and look for a) attending/consulting [facility] provider listed and b) the [facility] team listed Log into [email or web address] and use [place]'s universal password to access. If you do not have the password, please contact the hospital operator. Locate the [facility] provider you are looking for under [facility] and page to a number that you can be directly reached. If you still have difficulty reaching the provider, please page the [facility] (Director on Call) for the Hospitalists listed on amion for assistance.  09/19/2021, 9:03 PM

## 2021-09-19 NOTE — ED Provider Notes (Signed)
[facility] EMERGENCY DEPT Provider Note   CSN: [id number] Arrival date & time: 09/18/21  2031     History Chief Complaint  Patient presents with   Shortness of Breath    Albert Allen is a 87 y.o. male.  Patient is a 87 year old male with history of HTN, GERD, Gout presenting with complaints of shortness of breath worsening over the past several days.  He denies fevers or chills.  He does report some tightness in his chest over the past several days as well.  Symptoms worse with exertion, lying flat.  No leg swelling or calf pain.  The history is provided by the patient.  Shortness of Breath Severity:  Moderate Onset quality:  Gradual Duration:  1 week Timing:  Constant Progression:  Partially resolved Chronicity:  New Context: activity   Relieved by:  Nothing Worsened by:  Exertion (Change in position)     Past Medical History:  Diagnosis Date   ABNORMAL CV (STRESS) TEST 03/05/2009   ABUSE, ALCOHOL, IN REMISSION 05/31/2007   Adhesive capsulitis of shoulder 06/07/6282   Alcoholic polyneuropathy ([place]) 03/29/2008   ANEMIA, B12 DEFICIENCY 03/29/2008   Anxiety state, unspecified 11/19/2008   ASTHMA 05/31/2007   Asthma    CAD, NATIVE VESSEL 04/04/2009   CARPAL TUNNEL SYNDROME, RIGHT 09/05/2007   Cataract    removed bilaterally   Chronic prostatitis 03/29/2008   COPD (chronic obstructive pulmonary disease) ([place])    "they told me I have a touch of COPD"   Crohn's disease ([place])    Degenerative disc disease, cervical    DEGENERATIVE DISC DISEASE, CERVICAL SPINE 06/22/2007   Diverticulosis    DYSPHAGIA UNSPECIFIED 09/25/2008   Esophageal stricture    External hemorrhoids    GERD 05/31/2007   GOUT 05/31/2007   Hepatomegaly 09/25/2008   Hiatal hernia    HIP PAIN 03/20/2010   History of echocardiogram    Echo 4/17 - mild LVH, EF 50-55%, Gr 1 DD, trivial AI, MAC, mild MR   HYPERLIPIDEMIA 03/27/2009   HYPERTENSION 05/31/2007   HYPOTHYROIDISM 05/31/2007   LEFT BUNDLE  BRANCH BLOCK 02/11/2009   Liver hemangioma    Liver mass, left lobe    LOC OSTEOARTHROS NOT SPEC PRIM/SEC LOWER LEG 03/20/2010   OSTEOARTHRITIS 05/31/2007   PERSONAL HX COLONIC POLYPS 03/05/1997   adenomatous    PSA, INCREASED 03/20/2010   SINUSITIS, CHRONIC NOS 05/31/2007   Sore throat    CURRENTLY   SYNCOPE 11/19/2008   WEIGHT LOSS, RECENT 11/19/2008    Patient Active Problem List   Diagnosis Date Noted   Insomnia 08/04/2016   Midline low back pain without sciatica 04/20/2016   Right inguinal hernia 12/18/2015   Seasonal allergic rhinitis 04/17/2013   BPH (benign prostatic hyperplasia) 04/17/2013   Diverticulosis of large intestine 06/15/2011   Hernia, hiatal 06/15/2011   Diverticulum of duodenum 06/15/2011   PERSONAL HX COLONIC POLYPS 03/25/2010   PSA, INCREASED 03/20/2010   CAD, NATIVE VESSEL 04/04/2009   Hyperlipidemia 03/27/2009   LEFT BUNDLE BRANCH BLOCK 02/11/2009   HEPATOMEGALY 09/25/2008   ANEMIA, B12 DEFICIENCY 66/29/4765   ALCOHOLIC POLYNEUROPATHY 46/50/3546   Hypothyroidism 05/31/2007   ABUSE, ALCOHOL, IN REMISSION 05/31/2007   Essential hypertension 05/31/2007   Asthma 05/31/2007   GERD 05/31/2007   Osteoarthritis 05/31/2007    Past Surgical History:  Procedure Laterality Date   CARDIAC CATHETERIZATION     carpel tunnel release     EYE SURGERY     INGUINAL HERNIA REPAIR  12/18/2015  INGUINAL HERNIA REPAIR Right 12/18/2015   Procedure: OPEN REPAIR RIGHT INGUINAL HERNIA ;  Surgeon: Greer Pickerel, MD;  Location: [facility];  Service: General;  Laterality: Right;   INSERTION OF MESH Right 12/18/2015   Procedure: INSERTION OF MESH;  Surgeon: Greer Pickerel, MD;  Location: [facility];  Service: General;  Laterality: Right;   ORIF ULNAR FRACTURE  11/03/2012   Procedure: OPEN REDUCTION INTERNAL FIXATION (ORIF) ULNAR FRACTURE;  Surgeon: Hessie Dibble, MD;  Location: [facility];  Service: Orthopedics;  Laterality: Right;  ORIF radius and ulnar fractures   pul. colapse w/chest tube      TONSILLECTOMY         Family History  Problem Relation Age of Onset   Cancer Mother        In back but unsure of what type of cancer   Alcohol abuse Father    Lung disease Father        abscess   Diabetes Sister    Cancer Brother        throat   Diabetes Brother    Diabetes Sister    Colon cancer Neg Hx    Colon polyps Neg Hx    Esophageal cancer Neg Hx    Rectal cancer Neg Hx    Stomach cancer Neg Hx     Social History   Tobacco Use   Smoking status: Former    Types: Cigarettes    Quit date: 04/24/1998    Years since quitting: 23.4   Smokeless tobacco: Never  Vaping Use   Vaping Use: Never used  Substance Use Topics   Alcohol use: Yes    Alcohol/week: 5.0 standard drinks    Types: 5 Shots of liquor per week    Comment: Does not drink weekly but does buy a pint of liqour occasionally and will drink most of the bottle when he does   Drug use: No    Home Medications Prior to Admission medications   Medication Sig Start Date End Date Taking? Authorizing Provider  albuterol (VENTOLIN HFA) 108 (90 Base) MCG/ACT inhaler INHALE 2 PUFFS EVERY 6 HOURS AS NEEDED FOR WHEEZING OR SHORTNESS OF BREATH 06/16/21   [name], MD  Albuterol Sulfate (PROAIR RESPICLICK) 921 (90 Base) MCG/ACT AEPB Inhale 2 puffs into the lungs 4 (four) times daily as needed (shortness of breath). 11/05/20   [name], MD  ALPRAZolam Duanne Moron) 0.5 MG tablet Take 1 tablet (0.5 mg total) by mouth at bedtime. Only on Mondays and Thursdays 08/19/21   [name], MD  aspirin EC 81 MG tablet Take 81 mg by mouth daily.    [provider]  Cyanocobalamin (VITAMIN B-12 PO) Take by mouth.    [provider]  diclofenac (VOLTAREN) 75 MG EC tablet TAKE 1 TABLET BY MOUTH  TWICE DAILY AS NEEDED FOR  JOINT OR MUSCLE PAIN 05/07/21   [name], MD  famotidine (PEPCID) 40 MG tablet Take 1 tablet (40 mg total) by mouth at bedtime. 06/19/21   [name], MD  levothyroxine (SYNTHROID) 88 MCG  tablet Take 1 tablet (88 mcg total) by mouth daily before breakfast. On an empty stomach 06/19/21   [name], MD  LINZESS 72 MCG capsule TAKE 1 CAPSULE DAILY BEFORE BREAKFAST 06/18/21   Pyrtle, Lajuan Lines, MD  Multiple Vitamins-Minerals (PRESERVISION AREDS) CAPS Take by mouth.    [provider]  olmesartan (BENICAR) 20 MG tablet Take 0.5 tablets (10 mg total) by mouth daily. 04/25/21   Loman Brooklyn,  FNP  pantoprazole (PROTONIX) 40 MG tablet TAKE 1 TABLET BY MOUTH  DAILY 07/31/21   Pyrtle, Lajuan Lines, MD  silodosin (RAPAFLO) 4 MG CAPS capsule TAKE 1 CAPSULE ONCE A DAY 07/17/21   [name], MD    Allergies    Amoxicillin, Penicillins, Ceclor [cefaclor], Celebrex [celecoxib], and Levaquin [levofloxacin in d5w]  Review of Systems   Review of Systems  Respiratory:  Positive for shortness of breath.   All other systems reviewed and are negative.  Physical Exam Updated Vital Signs BP 123/69 (BP Location: Right Arm)   Pulse 70   Temp 98 F (36.7 C) (Oral)   Resp 16   Ht _0  (1.702 m)   Wt 56.4 kg   SpO2 97%   BMI 19.47 kg/m   Physical Exam Vitals and nursing note reviewed.  Constitutional:      General: He is not in acute distress.    Appearance: He is well-developed. He is not diaphoretic.  HENT:     Head: Normocephalic and atraumatic.  Cardiovascular:     Rate and Rhythm: Normal rate and regular rhythm.     Heart sounds: No murmur heard.   No friction rub.  Pulmonary:     Effort: Pulmonary effort is normal. No respiratory distress.     Breath sounds: Normal breath sounds. No wheezing or rales.  Abdominal:     General: Bowel sounds are normal. There is no distension.     Palpations: Abdomen is soft.     Tenderness: There is no abdominal tenderness.  Musculoskeletal:        General: Normal range of motion.     Cervical back: Normal range of motion and neck supple.     Right lower leg: No tenderness. No edema.     Left lower leg: No tenderness. No edema.  Skin:     General: Skin is warm and dry.  Neurological:     Mental Status: He is alert and oriented to person, place, and time.     Coordination: Coordination normal.    ED Results / Procedures / Treatments   Labs (all labs ordered are listed, but only abnormal results are displayed) Labs Reviewed  BASIC METABOLIC PANEL - Abnormal; Notable for the following components:      Result Value   Sodium 132 (*)    Glucose, Bld 112 (*)    All other components within normal limits  CBC - Abnormal; Notable for the following components:   RBC 3.41 (*)    Hemoglobin 11.9 (*)    HCT 35.5 (*)    MCV 104.1 (*)    MCH 34.9 (*)    Platelets 120 (*)    All other components within normal limits  TROPONIN I (HIGH SENSITIVITY) - Abnormal; Notable for the following components:   Troponin I (High Sensitivity) 93 (*)    All other components within normal limits  BRAIN NATRIURETIC PEPTIDE  TROPONIN I (HIGH SENSITIVITY)    EKG EKG Interpretation  Date/Time:  Thursday September 18 2021 20:52:44 EDT Ventricular Rate:  87 PR Interval:  288 QRS Duration: 136 QT Interval:  398 QTC Calculation: 478 R Axis:   65 Text Interpretation: Sinus rhythm with 1st degree A-V block Left bundle branch block Abnormal ECG Confirmed by Veryl Speak [phone]) on 09/19/2021 1:49:26 AM  Radiology DG Chest 2 View  Result Date: 09/18/2021 CLINICAL DATA:  Chest pain shortness of breath EXAM: CHEST - 2 VIEW COMPARISON:  12/09/2017 FINDINGS: Small left greater than right  pleural effusions. Basilar airspace disease. Normal cardiac size. Aortic atherosclerosis. No pneumothorax IMPRESSION: Small left greater than right pleural effusions with basilar atelectasis or pneumonia Electronically Signed   By: Donavan Foil M.D.   On: 09/18/2021 21:17    Procedures Procedures   Medications Ordered in ED Medications - No data to display  ED Course  I have reviewed the triage vital signs and the nursing notes.  Pertinent labs & imaging  results that were available during my care of the patient were reviewed by me and considered in my medical decision making (see chart for details).    MDM Rules/Calculators/A&P  Patient is a 87 year old male with medical history as per HPI presenting with chest tightness and shortness of breath, worsening over the past 5 days.  Patient's initial chest x-ray shows small bilateral pleural effusions which appear large on CT scan.  His BNP is elevated and he also has mild troponin leak.  Patient's last echocardiogram on file is from 2017 showing EF of 50 to 55% and grade 1 diastolic dysfunction.  Patient to be given IV Lasix and I feel as though admission to the hospital is indicated.  I have spoken with Dr. Myna Hidalgo who agrees to admit.  Final Clinical Impression(s) / ED Diagnoses Final diagnoses:  None    Rx / DC Orders ED Discharge Orders     None        Veryl Speak, MD 09/19/21 [phone]

## 2021-09-19 NOTE — ED Notes (Signed)
RT assisted pt with admin of his home Albuterol MDI. Pt respiratory status stable w/no distress noted at this time.

## 2021-09-19 NOTE — ED Notes (Signed)
Attempted to give report to RN for 6E-27. Was told that no one is able to take a report at this time and it could be hours before they are able to.

## 2021-09-19 NOTE — Progress Notes (Signed)
When patient arrived, rhythm appeared to be in afib, I had the tech do a EKG which showed NSR. I had strip posted from 1717 with visualization of strip that we saw on admission. Dr. Karleen Hampshire notified when she came to floor

## 2021-09-19 NOTE — ED Notes (Signed)
Pt given meal and milk.

## 2021-09-19 NOTE — ED Provider Notes (Signed)
Patient holding in the ED waiting for bed availability.  His home meds have been ordered.  Patient is requesting a dose of Xanax as he feels anxious waiting in the emergency room.  He does regularly take it at home   Dorie Rank, MD 09/19/21 1127

## 2021-09-19 NOTE — Progress Notes (Signed)
Dr. Karleen Hampshire paged per consult person for orders

## 2021-09-19 NOTE — ED Notes (Signed)
Report given to [place], RN on [place].

## 2021-09-20 ENCOUNTER — Telehealth: Payer: Self-pay | Admitting: Cardiology

## 2021-09-20 ENCOUNTER — Inpatient Hospital Stay (HOSPITAL_COMMUNITY): Payer: Medicare Other

## 2021-09-20 DIAGNOSIS — E039 Hypothyroidism, unspecified: Secondary | ICD-10-CM | POA: Diagnosis not present

## 2021-09-20 DIAGNOSIS — I480 Paroxysmal atrial fibrillation: Secondary | ICD-10-CM | POA: Diagnosis not present

## 2021-09-20 DIAGNOSIS — I5041 Acute combined systolic (congestive) and diastolic (congestive) heart failure: Secondary | ICD-10-CM

## 2021-09-20 DIAGNOSIS — G621 Alcoholic polyneuropathy: Secondary | ICD-10-CM | POA: Diagnosis not present

## 2021-09-20 DIAGNOSIS — J90 Pleural effusion, not elsewhere classified: Secondary | ICD-10-CM

## 2021-09-20 DIAGNOSIS — I501 Left ventricular failure: Secondary | ICD-10-CM

## 2021-09-20 DIAGNOSIS — I10 Essential (primary) hypertension: Secondary | ICD-10-CM | POA: Diagnosis not present

## 2021-09-20 DIAGNOSIS — I509 Heart failure, unspecified: Secondary | ICD-10-CM | POA: Diagnosis not present

## 2021-09-20 LAB — IRON AND TIBC
Iron: 25 ug/dL — ABNORMAL LOW (ref 45–182)
Saturation Ratios: 7 % — ABNORMAL LOW (ref 17.9–39.5)
TIBC: 354 ug/dL (ref 250–450)
UIBC: 329 ug/dL

## 2021-09-20 LAB — BODY FLUID CELL COUNT WITH DIFFERENTIAL
Eos, Fluid: 0 %
Lymphs, Fluid: 43 %
Monocyte-Macrophage-Serous Fluid: 51 % (ref 50–90)
Neutrophil Count, Fluid: 6 % (ref 0–25)
Total Nucleated Cell Count, Fluid: 158 cu mm (ref 0–1000)

## 2021-09-20 LAB — GRAM STAIN

## 2021-09-20 LAB — ECHOCARDIOGRAM COMPLETE
Area-P 1/2: 5.38 cm2
Calc EF: 21.2 %
Height: 67 in
MV VTI: 2.24 cm2
P 1/2 time: 793 msec
S' Lateral: 4.1 cm
Single Plane A2C EF: 22.2 %
Single Plane A4C EF: 15.1 %
Weight: 1996.8 oz

## 2021-09-20 LAB — COMPREHENSIVE METABOLIC PANEL
ALT: 7 U/L (ref 0–44)
AST: 11 U/L — ABNORMAL LOW (ref 15–41)
Albumin: 3 g/dL — ABNORMAL LOW (ref 3.5–5.0)
Alkaline Phosphatase: 51 U/L (ref 38–126)
Anion gap: 9 (ref 5–15)
BUN: 18 mg/dL (ref 8–23)
CO2: 23 mmol/L (ref 22–32)
Calcium: 8.5 mg/dL — ABNORMAL LOW (ref 8.9–10.3)
Chloride: 98 mmol/L (ref 98–111)
Creatinine, Ser: 1.05 mg/dL (ref 0.61–1.24)
GFR, Estimated: >60 mL/min (ref >60)
Glucose, Bld: 113 mg/dL — ABNORMAL HIGH (ref 70–99)
Potassium: 4.1 mmol/L (ref 3.5–5.1)
Sodium: 130 mmol/L — ABNORMAL LOW (ref 135–145)
Total Bilirubin: 0.9 mg/dL (ref 0.3–1.2)
Total Protein: 5.4 g/dL — ABNORMAL LOW (ref 6.5–8.1)

## 2021-09-20 LAB — VITAMIN B12: Vitamin B-12: 539 pg/mL (ref 180–914)

## 2021-09-20 LAB — SODIUM, URINE, RANDOM: Sodium, Ur: 75 mmol/L

## 2021-09-20 LAB — OSMOLALITY, URINE: Osmolality, Ur: 263 mOsm/kg — ABNORMAL LOW (ref 300–900)

## 2021-09-20 LAB — FERRITIN: Ferritin: 19 ng/mL — ABNORMAL LOW (ref 24–336)

## 2021-09-20 LAB — FOLATE: Folate: 17.5 ng/mL (ref >5.9)

## 2021-09-20 LAB — LACTATE DEHYDROGENASE, PLEURAL OR PERITONEAL FLUID: LD, Fluid: 45 U/L — ABNORMAL HIGH (ref 3–23)

## 2021-09-20 LAB — RETICULOCYTES
Immature Retic Fract: 20.5 % — ABNORMAL HIGH (ref 2.3–15.9)
RBC.: 3.03 MIL/uL — ABNORMAL LOW (ref 4.22–5.81)
Retic Count, Absolute: 75.1 10*3/uL (ref 19.0–186.0)
Retic Ct Pct: 2.5 % (ref 0.4–3.1)

## 2021-09-20 LAB — MAGNESIUM: Magnesium: 1.9 mg/dL (ref 1.7–2.4)

## 2021-09-20 LAB — PROTEIN, PLEURAL OR PERITONEAL FLUID: Total protein, fluid: <3 g/dL

## 2021-09-20 LAB — TSH: TSH: 8.62 u[IU]/mL — ABNORMAL HIGH (ref 0.350–4.500)

## 2021-09-20 LAB — ALBUMIN, PLEURAL OR PERITONEAL FLUID: Albumin, Fluid: <1.5 g/dL

## 2021-09-20 MED ORDER — SPIRONOLACTONE 12.5 MG HALF TABLET
12.5000 mg | ORAL_TABLET | Freq: Every day | ORAL | Status: DC
  Administered 2021-09-20 – 2021-09-23 (×4): 12.5 mg via ORAL
  Filled 2021-09-20 (×4): qty 1

## 2021-09-20 MED ORDER — PERFLUTREN LIPID MICROSPHERE
1.0000 mL | INTRAVENOUS | Status: AC | PRN
  Administered 2021-09-20: 2 mL via INTRAVENOUS
  Filled 2021-09-20: qty 10

## 2021-09-20 MED ORDER — APIXABAN 2.5 MG PO TABS
2.5000 mg | ORAL_TABLET | Freq: Two times a day (BID) | ORAL | Status: DC
  Administered 2021-09-20: 2.5 mg via ORAL
  Filled 2021-09-20: qty 1

## 2021-09-20 MED ORDER — ENSURE ENLIVE PO LIQD
237.0000 mL | Freq: Two times a day (BID) | ORAL | Status: DC
  Administered 2021-09-21 – 2021-09-26 (×9): 237 mL via ORAL
  Filled 2021-09-20 (×3): qty 237

## 2021-09-20 MED ORDER — ADULT MULTIVITAMIN W/MINERALS CH
1.0000 | ORAL_TABLET | Freq: Every day | ORAL | Status: DC
  Administered 2021-09-20 – 2021-09-26 (×7): 1 via ORAL
  Filled 2021-09-20 (×7): qty 1

## 2021-09-20 MED ORDER — LIDOCAINE HCL (PF) 1 % IJ SOLN
INTRAMUSCULAR | Status: AC
  Filled 2021-09-20: qty 30

## 2021-09-20 MED ORDER — METOPROLOL SUCCINATE ER 25 MG PO TB24
25.0000 mg | ORAL_TABLET | Freq: Every day | ORAL | Status: DC
  Administered 2021-09-20 – 2021-09-22 (×3): 25 mg via ORAL
  Filled 2021-09-20 (×4): qty 1

## 2021-09-20 NOTE — Telephone Encounter (Signed)
Error

## 2021-09-20 NOTE — Progress Notes (Signed)
PROGRESS NOTE    HARPER SMOKER  HQI:[id number] DOB: 1923/01/21 DOA: 09/18/2021 PCP: [name], MD    Chief Complaint  Patient presents with   Shortness of Breath    Brief Narrative:    FREEMAN BORBA is a 87 y.o. male with medical history significant of anemia, alcoholic polyneuropathy, COPD, hypertension, hypothyroidism,LBBB, gerd presents to ED for sob for one week. He denies any chest pain, reports feeling sob and some tightness on ambulation. CT angio of the chest showed large bilateral effusions with minimal atelectasis. No PE.  He was referred to [facility] for admission for CHF exacerbation.   Assessment & Plan:   Active Problems:   Hypothyroidism   Hyperlipidemia   ANEMIA, B12 DEFICIENCY   Alcoholic polyneuropathy ([facility])   Essential hypertension   GERD   Acute on chronic diastolic CHF (congestive heart failure) (H[facility])   Acute on chronic diastolic heart failure Gentle diuresis with IV Lasix 20 mg, with strict intake and output and daily weights. Echocardiogram pending.. IR consulted for thoracentesis and left pleural fluid aspirated and sent for analysis. Patient continues to be on room air with good saturations.     Elevated troponins probably from demand ischemia from acute CHF.  Episode of atrial flutter/fib yesterday around 5: 17 p.m. Patient was started on IV heparin for anticoagulation. Patient currently in sinus rhythm.    Hypothyroidism TSH elevated at 8, continue with Synthroid, get free T3 and T4 levels.   COPD No wheezing on exam Continue with duo nebs.  Anxiety Continue with Xanax.   Mild macrocytic anemia Therapy evaluations showing iron deficiency anemia Iron supplementation will be added.  DVT prophylaxis: heparin.  Code Status: DNR Family Communication: none at bedside.  Disposition:   Status is: Inpatient  Remains inpatient appropriate because: IV lasix, echocardiogram, therapy evals. IV heparin.        Consultants:   CARDIOLOGY  Procedures: echocardiogram.   Antimicrobials: none.    Subjective: Some chest tight ness earlier this am.   Objective: Vitals:   09/19/21 2110 09/19/21 2350 09/20/21 0450 09/20/21 0749  BP:  113/61 101/60 139/76  Pulse: 84 81 70 (!) 105  Resp: 16 20 17 20   Temp:   98.2 F (36.8 C) 98.1 F (36.7 C)  TempSrc:   Axillary Oral  SpO2: 98% 93% 95% 95%  Weight:   56.6 kg   Height:       No intake or output data in the 24 hours ending 09/20/21 1041 Filed Weights   09/18/21 2057 09/19/21 1719 09/20/21 0450  Weight: 56.4 kg 54.8 kg 56.6 kg    Examination:  General exam: Appears calm and comfortable  Respiratory system: Clear to auscultation. Respiratory effort normal. Cardiovascular system: S1 & S2 heard, RRR. No JVD,  No pedal edema. Gastrointestinal system: Abdomen is nondistended, soft and nontender.  Normal bowel sounds heard. Central nervous system: Alert and oriented. No focal neurological deficits. Extremities: Symmetric 5 x 5 power. Skin: No rashes, lesions or ulcers Psychiatry: Mood & affect appropriate.     Data Reviewed: I have personally reviewed following labs and imaging studies  CBC: Recent Labs  Lab 09/18/21 2108  WBC 7.0  HGB 11.9*  HCT 35.5*  MCV 104.1*  PLT 120*    Basic Metabolic Panel: Recent Labs  Lab 09/18/21 2108 09/20/21 0319  NA 132* 130*  K 4.6 4.1  CL 99 98  CO2 24 23  GLUCOSE 112* 113*  BUN 18 18  CREATININE 1.06 1.05  CALCIUM  9.3 8.5*  MG  --  1.9    GFR: Estimated Creatinine Clearance: 32.9 mL/min (by C-G formula based on SCr of 1.05 mg/dL).  Liver Function Tests: Recent Labs  Lab 09/19/21 1858 09/20/21 0319  AST 12* 11*  ALT 9 7  ALKPHOS 48 51  BILITOT 0.8 0.9  PROT 5.6* 5.4*  ALBUMIN 3.1* 3.0*    CBG: No results for input(s): GLUCAP in the last 168 hours.   Recent Results (from the past 240 hour(s))  Resp Panel by RT-PCR (Flu A&B, Covid) Nasopharyngeal Swab     Status: None   Collection  Time: 09/19/21  1:48 AM   Specimen: Nasopharyngeal Swab; Nasopharyngeal(NP) swabs in vial transport medium  Result Value Ref Range Status   SARS Coronavirus 2 by RT PCR NEGATIVE NEGATIVE Final    Comment: (NOTE) SARS-CoV-2 target nucleic acids are NOT DETECTED.  The SARS-CoV-2 RNA is generally detectable in upper respiratory specimens during the acute phase of infection. The lowest concentration of SARS-CoV-2 viral copies this assay can detect is 138 copies/mL. A negative result does not preclude SARS-Cov-2 infection and should not be used as the sole basis for treatment or other patient management decisions. A negative result may occur with  improper specimen collection/handling, submission of specimen other than nasopharyngeal swab, presence of viral mutation(s) within the areas targeted by this assay, and inadequate number of viral copies(<138 copies/mL). A negative result must be combined with clinical observations, patient history, and epidemiological information. The expected result is Negative.  Fact Sheet for Patients:  EntrepreneurPulse.com.au  Fact Sheet for Healthcare Providers:  IncredibleEmployment.be  This test is no t yet approved or cleared by the [place] FDA and  has been authorized for detection and/or diagnosis of SARS-CoV-2 by FDA under an Emergency Use Authorization (EUA). This EUA will remain  in effect (meaning this test can be used) for the duration of the COVID-19 declaration under Section 564(b)(1) of the Act, 21 U.S.C.section 360bbb-3(b)(1), unless the authorization is terminated  or revoked sooner.       Influenza A by PCR NEGATIVE NEGATIVE Final   Influenza B by PCR NEGATIVE NEGATIVE Final    Comment: (NOTE) The Xpert Xpress SARS-CoV-2/FLU/RSV plus assay is intended as an aid in the diagnosis of influenza from Nasopharyngeal swab specimens and should not be used as a sole basis for treatment. Nasal washings  and aspirates are unacceptable for Xpert Xpress SARS-CoV-2/FLU/RSV testing.  Fact Sheet for Patients: EntrepreneurPulse.com.au  Fact Sheet for Healthcare Providers: IncredibleEmployment.be  This test is not yet approved or cleared by the [place] FDA and has been authorized for detection and/or diagnosis of SARS-CoV-2 by FDA under an Emergency Use Authorization (EUA). This EUA will remain in effect (meaning this test can be used) for the duration of the COVID-19 declaration under Section 564(b)(1) of the Act, 21 U.S.C. section 360bbb-3(b)(1), unless the authorization is terminated or revoked.  Performed at [facility], [name] [place], [place] [address]          Radiology Studies: DG Chest 2 View  Result Date: 09/18/2021 CLINICAL DATA:  Chest pain shortness of breath EXAM: CHEST - 2 VIEW COMPARISON:  12/09/2017 FINDINGS: Small left greater than right pleural effusions. Basilar airspace disease. Normal cardiac size. Aortic atherosclerosis. No pneumothorax IMPRESSION: Small left greater than right pleural effusions with basilar atelectasis or pneumonia Electronically Signed   By: Donavan Foil M.D.   On: 09/18/2021 21:17   CT Angio Chest PE W and/or Wo Contrast  Result Date: 09/19/2021 CLINICAL DATA:  Shortness of breath for 1 week EXAM: CT ANGIOGRAPHY CHEST WITH CONTRAST TECHNIQUE: Multidetector CT imaging of the chest was performed using the standard protocol during bolus administration of intravenous contrast. Multiplanar CT image reconstructions and MIPs were obtained to evaluate the vascular anatomy. CONTRAST:  8mL OMNIPAQUE IOHEXOL 350 MG/ML SOLN COMPARISON:  Chest x-ray from the previous day. FINDINGS: Cardiovascular: Atherosclerotic calcifications of the aorta are noted without significant aneurysmal dilatation. The degree of opacification of the aorta is limited precluding evaluation for dissection. The  pulmonary artery shows a normal branching pattern without intraluminal filling defect to suggest pulmonary embolism. Coronary calcifications seen. Mediastinum/Nodes: Thoracic inlet is within normal limits. No sizable hilar or mediastinal adenopathy is noted. The esophagus as visualized is within normal limits. Lungs/Pleura: Large bilateral pleural effusions are noted left slightly greater than right. Some basilar atelectasis is noted bilaterally. Emphysematous changes are noted. No focal confluent infiltrate is seen. No pneumothorax is noted. Upper Abdomen: Visualized upper abdomen is unremarkable. Musculoskeletal: Degenerative changes of the thoracic spine are noted. No acute rib abnormality is seen. Review of the MIP images confirms the above findings. IMPRESSION: No evidence of pulmonary emboli. Large bilateral pleural effusions with only minimal atelectasis in the bases. Aortic Atherosclerosis (ICD10-I70.0) and Emphysema (ICD10-J43.9). Electronically Signed   By: Inez Catalina M.D.   On: 09/19/2021 00:44        Scheduled Meds:  aspirin EC  81 mg Oral Daily   enoxaparin (LOVENOX) injection  30 mg Subcutaneous Q24H   furosemide  20 mg Intravenous Daily   influenza vaccine adjuvanted  0.5 mL Intramuscular Tomorrow-1000   levothyroxine  88 mcg Oral Q0600   lidocaine (PF)       linaclotide  145 mcg Oral QAC breakfast   pantoprazole  40 mg Oral Daily   sodium chloride flush  3 mL Intravenous Q12H   tamsulosin  0.4 mg Oral Daily   vitamin B-12  100 mcg Oral Daily   Continuous Infusions:  sodium chloride       LOS: 1 day       Hosie Poisson, MD [facility]   To contact the attending provider between 7A-7P or the covering provider during after hours 7P-7A, please log into the web site [email or web address] and access using universal [place] password for that web site. If you do not have the password, please call the hospital operator.  09/20/2021, 10:41 AM

## 2021-09-20 NOTE — Consult Note (Addendum)
Cardiology Consultation:   Patient ID: Albert Allen MRN: [id number]; DOB: 05-Feb-1923  Admit date: 09/18/2021 Date of Consult: 09/20/2021  PCP:  [name], MD   [facility] Providers Cardiologist:  Lauree Chandler, MD    Patient Profile:   Albert Allen is a 87 y.o. male with a hx of diffuse CAD (medical management), hypertension, anemia, asthma/COPD, GERD, EtOH use and hypothyroidism who is being seen 09/20/2021 for the evaluation of elevated troponin/CHF at the request of Dr. Karleen Hampshire.  History of Present Illness:   Mr. Albert Allen is a 87 year old male with past medical history noted above.  He has been followed by Dr. [name] as an outpatient.  Underwent cardiac cath in April 2010 which showed 70 to 80% heavily calcified stenosis in the mid LAD before takeoff of large diagonal branch.  It was felt not to be a good vessel for PCI.  There was diffuse mildly obstructive disease in other vessels and he was treated medically.  Echocardiogram 02/2016 with normal LV function, mild LVH, trivial AI and mild MR.  Office notes indicate he has had atypical chest pain for many years which is been attributed to GERD and anxiety.  Vascular screening in October 2020 showed mild carotid artery disease with normal ABIs and no AAA.  He was last seen in the office on 09/2020 and denied any symptoms.  He was continued on aspirin and statin.  Not on a beta-blocker secondary to bradycardia.   Presented to the ED on 10/20 with complaints of shortness of breath and tightness in his chest.  Symptoms started several days prior to admission but worsened the night of admission.  He presented to [facility].   Labs on admission showed sodium 132, potassium 4.6, creatinine 1.06, BNP 1425, high-sensitivity troponin 93>> 115, WBC 7, hemoglobin 11.9, TSH 8.6.  EKG showed sinus rhythm with first-degree AV block, left bundle branch block. Chest X-ray with small bilateral pleural effusions with  basilar atelectasis versus pneumonia.  CT angio chest showed large bilateral pleural effusions with minimal atelectasis in bases.  He is admitted to internal medicine and started on IV Lasix 20 mg daily.  IR consult pending for thoracentesis.  Cardiology consulted in regards to elevated troponin and CHF.  In talking with patient he reports that he is very independent.  He works around his home doing ADLs, mows his grass as well without any anginal symptoms.  States he has felt a little rundown recently but has not had any chest pain.  Did note some issues with orthopnea recently.  Denies any lower extremity edema, palpitations or lightheadedness.    Past Medical History:  Diagnosis Date   ABNORMAL CV (STRESS) TEST 03/05/2009   ABUSE, ALCOHOL, IN REMISSION 05/31/2007   Adhesive capsulitis of shoulder 0/17/5102   Alcoholic polyneuropathy ([place]) 03/29/2008   ANEMIA, B12 DEFICIENCY 03/29/2008   Anxiety state, unspecified 11/19/2008   ASTHMA 05/31/2007   Asthma    CAD, NATIVE VESSEL 04/04/2009   CARPAL TUNNEL SYNDROME, RIGHT 09/05/2007   Cataract    removed bilaterally   Chronic prostatitis 03/29/2008   COPD (chronic obstructive pulmonary disease) ([place])    "they told me I have a touch of COPD"   Crohn's disease ([place])    Degenerative disc disease, cervical    DEGENERATIVE DISC DISEASE, CERVICAL SPINE 06/22/2007   Diverticulosis    DYSPHAGIA UNSPECIFIED 09/25/2008   Esophageal stricture    External hemorrhoids    GERD 05/31/2007   GOUT 05/31/2007   Hepatomegaly 09/25/2008  Hiatal hernia    HIP PAIN 03/20/2010   History of echocardiogram    Echo 4/17 - mild LVH, EF 50-55%, Gr 1 DD, trivial AI, MAC, mild MR   HYPERLIPIDEMIA 03/27/2009   HYPERTENSION 05/31/2007   HYPOTHYROIDISM 05/31/2007   LEFT BUNDLE BRANCH BLOCK 02/11/2009   Liver hemangioma    Liver mass, left lobe    LOC OSTEOARTHROS NOT SPEC PRIM/SEC LOWER LEG 03/20/2010   OSTEOARTHRITIS 05/31/2007   PERSONAL HX COLONIC POLYPS 03/05/1997   adenomatous     PSA, INCREASED 03/20/2010   SINUSITIS, CHRONIC NOS 05/31/2007   Sore throat    CURRENTLY   SYNCOPE 11/19/2008   WEIGHT LOSS, RECENT 11/19/2008    Past Surgical History:  Procedure Laterality Date   CARDIAC CATHETERIZATION     carpel tunnel release     EYE SURGERY     INGUINAL HERNIA REPAIR  12/18/2015   INGUINAL HERNIA REPAIR Right 12/18/2015   Procedure: OPEN REPAIR RIGHT INGUINAL HERNIA ;  Surgeon: Greer Pickerel, MD;  Location: [place];  Service: General;  Laterality: Right;   INSERTION OF MESH Right 12/18/2015   Procedure: INSERTION OF MESH;  Surgeon: Greer Pickerel, MD;  Location: [place];  Service: General;  Laterality: Right;   ORIF ULNAR FRACTURE  11/03/2012   Procedure: OPEN REDUCTION INTERNAL FIXATION (ORIF) ULNAR FRACTURE;  Surgeon: Hessie Dibble, MD;  Location: [place];  Service: Orthopedics;  Laterality: Right;  ORIF radius and ulnar fractures   pul. colapse w/chest tube     TONSILLECTOMY       Home Medications:  Prior to Admission medications   Medication Sig Start Date End Date Taking? Authorizing Provider  albuterol (VENTOLIN HFA) 108 (90 Base) MCG/ACT inhaler INHALE 2 PUFFS EVERY 6 HOURS AS NEEDED FOR WHEEZING OR SHORTNESS OF BREATH Patient taking differently: Inhale 2 puffs into the lungs every 6 (six) hours as needed for wheezing or shortness of breath. 06/16/21  Yes Stacks, Cletus Gash, MD  ALPRAZolam Duanne Moron) 0.5 MG tablet Take 1 tablet (0.5 mg total) by mouth at bedtime. Only on Mondays and Thursdays Patient taking differently: Take 0.5 mg by mouth daily as needed for anxiety. 08/19/21  Yes [name], MD  aspirin EC 81 MG tablet Take 81 mg by mouth daily.   Yes [provider]  Cyanocobalamin (VITAMIN B-12 PO) Take 1 tablet by mouth daily.   Yes [provider]  diclofenac (VOLTAREN) 75 MG EC tablet TAKE 1 TABLET BY MOUTH  TWICE DAILY AS NEEDED FOR  JOINT OR MUSCLE PAIN Patient taking differently: Take 75 mg by mouth daily as needed for mild pain. 05/07/21   Yes Stacks, Cletus Gash, MD  hydroxypropyl methylcellulose / hypromellose (ISOPTO TEARS / GONIOVISC) 2.5 % ophthalmic solution Place 1 drop into both eyes daily.   Yes [provider]  levothyroxine (SYNTHROID) 88 MCG tablet Take 1 tablet (88 mcg total) by mouth daily before breakfast. On an empty stomach 06/19/21  Yes Stacks, Cletus Gash, MD  magnesium hydroxide (MILK OF MAGNESIA) 400 MG/5ML suspension Take 15 mLs by mouth daily as needed for mild constipation.   Yes [provider]  Multiple Vitamins-Minerals (PRESERVISION AREDS) CAPS Take 1 capsule by mouth daily.   Yes [provider]  pantoprazole (PROTONIX) 40 MG tablet TAKE 1 TABLET BY MOUTH  DAILY Patient taking differently: Take 40 mg by mouth daily. 07/31/21  Yes Pyrtle, Lajuan Lines, MD  silodosin (RAPAFLO) 4 MG CAPS capsule TAKE 1 CAPSULE ONCE A DAY Patient taking differently: Take 4 mg  by mouth daily. 07/17/21  Yes [name], MD  Albuterol Sulfate (PROAIR RESPICLICK) 099 (90 Base) MCG/ACT AEPB Inhale 2 puffs into the lungs 4 (four) times daily as needed (shortness of breath). Patient not taking: No sig reported 11/05/20   [name], MD  famotidine (PEPCID) 40 MG tablet Take 1 tablet (40 mg total) by mouth at bedtime. Patient not taking: No sig reported 06/19/21   [name], MD  LINZESS 72 MCG capsule TAKE 1 CAPSULE DAILY BEFORE BREAKFAST Patient not taking: No sig reported 06/18/21   Pyrtle, Lajuan Lines, MD  olmesartan (BENICAR) 20 MG tablet Take 0.5 tablets (10 mg total) by mouth daily. Patient not taking: No sig reported 04/25/21   Loman Brooklyn, FNP    Inpatient Medications: Scheduled Meds:  aspirin EC  81 mg Oral Daily   enoxaparin (LOVENOX) injection  30 mg Subcutaneous Q24H   furosemide  20 mg Intravenous Daily   influenza vaccine adjuvanted  0.5 mL Intramuscular Tomorrow-1000   levothyroxine  88 mcg Oral Q0600   linaclotide  145 mcg Oral QAC breakfast   pantoprazole  40 mg Oral Daily   sodium chloride  flush  3 mL Intravenous Q12H   tamsulosin  0.4 mg Oral Daily   vitamin B-12  100 mcg Oral Daily   Continuous Infusions:  sodium chloride     PRN Meds: sodium chloride, acetaminophen, albuterol, ALPRAZolam, ipratropium-albuterol, ondansetron (ZOFRAN) IV, sodium chloride flush  Allergies:    Allergies  Allergen Reactions   Amoxicillin Swelling and Other (See Comments)    Pt has swelling with PCN's   Penicillins Swelling    Lips swelling Has patient had a PCN reaction causing immediate rash, facial/tongue/throat swelling, SOB or lightheadedness with hypotension: No Has patient had a PCN reaction causing severe rash involving mucus membranes or skin necrosis: No Has patient had a PCN reaction that required hospitalization No Has patient had a PCN reaction occurring within the last 10 years: No If all of the above answers are "NO", then may proceed with Cephalosporin use.   Ceclor [Cefaclor] Other (See Comments)    Pt does not remember reaction   Celebrex [Celecoxib] Other (See Comments)    Pt does not remember reaction   Levaquin [Levofloxacin In D5w] Other (See Comments)    Pt does not remember reaction    Social History:   Social History   Socioeconomic History   Marital status: Married    Spouse name: Not on file   Number of children: 2   Years of education: 9   Highest education level: 9th grade  Occupational History   Occupation: retired    Comment: Oncologist  Tobacco Use   Smoking status: Former    Types: Cigarettes    Quit date: 04/24/1998    Years since quitting: 23.4   Smokeless tobacco: Never  Vaping Use   Vaping Use: Never used  Substance and Sexual Activity   Alcohol use: Yes    Alcohol/week: 5.0 standard drinks    Types: 5 Shots of liquor per week    Comment: Does not drink weekly but does buy a pint of liqour occasionally and will drink most of the bottle when he does   Drug use: No   Sexual activity: Not Currently  Other Topics Concern   Not on  file  Social History Narrative   Not on file   Social Determinants of Health   Financial Resource Strain: Not on file  Food Insecurity: Not on file  Transportation Needs: Not on file  Physical Activity: Not on file  Stress: Not on file  Social Connections: Not on file  Intimate Partner Violence: Not on file    Family History:    Family History  Problem Relation Age of Onset   Cancer Mother        In back but unsure of what type of cancer   Alcohol abuse Father    Lung disease Father        abscess   Diabetes Sister    Cancer Brother        throat   Diabetes Brother    Diabetes Sister    Colon cancer Neg Hx    Colon polyps Neg Hx    Esophageal cancer Neg Hx    Rectal cancer Neg Hx    Stomach cancer Neg Hx      ROS:  Please see the history of present illness.   All other ROS reviewed and negative.     Physical Exam/Data:   Vitals:   09/19/21 1920 09/19/21 2110 09/19/21 2350 09/20/21 0450  BP: 127/69  113/61 101/60  Pulse: 89 84 81 70  Resp: _0 Temp:    98.2 F (36.8 C)  TempSrc:    Axillary  SpO2: 96% 98% 93% 95%  Weight:    56.6 kg  Height:       No intake or output data in the 24 hours ending 09/20/21 0729 Last 3 Weights 09/20/2021 09/19/2021 09/18/2021  Weight (lbs) 124 lb 12.8 oz 120 lb 13 oz 124 lb 5.4 oz  Weight (kg) 56.609 kg 54.8 kg 56.4 kg     Body mass index is 19.55 kg/m.  General:  Well nourished, well developed, in no acute distress HEENT: normal Neck: no JVD Vascular: No carotid bruits; Distal pulses 2+ bilaterally Cardiac:  normal S1, S2; RRR; soft systolic murmur  Lungs: Diminished in bases Abd: soft, nontender, no hepatomegaly  Ext: no edema Musculoskeletal:  No deformities, BUE and BLE strength normal and equal Skin: warm and dry  Neuro:  CNs 2-12 intact, no focal abnormalities noted Psych:  Normal affect   EKG:  The EKG was personally reviewed and demonstrates: Sinus rhythm, 70 bpm, first-degree AV block, left bundle  branch block (chronic) Telemetry:  Telemetry was personally reviewed and demonstrates: Sinus rhythm with first-degree AV block, intermittent episodes sinus tach.   -- Does have 1 episode around 1715 10/21 which looks like Afib.   Relevant CV Studies:  Cath: 03/2009   ANGIOGRAPHIC FINDINGS:  1. The left main coronary artery has no evidence of obstructive      disease.  2. The left anterior descending is a moderate-sized vessel that      courses to the apex and gives off a large third diagonal branch      that appears to be larger in caliber than the mid and distal LAD.      The proximal portion of the LAD has heavy calcification and a long      area of 50% stenosis.  Just prior to the takeoff of the large      diagonal branch, there is a 70-80% heavily calcified area of      stenosis.  The remainder of the mid segment of the LAD is heavily      diseased with 50-60% stenosis and calcification.  The large      diagonal branch has a 50% ostial stenosis.  The distal LAD is small  in caliber and has plaque disease only.  3. The circumflex artery has plaque in the proximal portion and gives      off 2 small early marginal branches.  The third marginal branch is      a moderate-sized vessel and has an ostial 20% stenosis.  4. The right coronary artery is a dominant vessel that has serial 30%      lesions in the proximal portion.  The distal right coronary artery      bifurcates into a posterior descending artery and a branching      posterolateral segment.  5. Left ventricular angiogram was performed in the RAO projection and      shows normal left ventricular systolic function with no wall motion      abnormalities.  Ejection fraction is estimated at 60%.    IMPRESSION:  1. Single-vessel coronary artery disease with a heavily calcified      proximal and mid LAD and an area of stenosis that is just prior to      the take off of a large diagonal branch.  2. Normal left ventricular  systolic function.    RECOMMENDATIONS:  This patient's lesion in the proximal mid LAD is not  favorable for percutaneous coronary intervention.  There is heavy  calcification and an area of stenosis that is just at the take off of a  large diagonal branch which is a very important vessel for this patient.  Any attempts at percutaneous intervention would potentially compromise  this large diagonal branch.  The mid and distal LAD also does not appear  to be large in caliber and would most likely require stenting with a  very small stent.  Since Mr. Benish is currently having no exertional  angina, no exertional shortness of breath, and no overall fatigue, I  think it would be best at this time to proceed with medical management  of his single-vessel coronary artery disease.  As stated above, his left  ventricular systolic function is normal.  His filling pressures in the  left ventricle are normal.  I will continue his daily aspirin and we  will start him on Lopressor 25 mg twice daily and will start him on a  statin medication as well.  I will see him as an outpatient in several  weeks to see how he is doing and we will plan in the future to perform a  treadmill exercise test to see if he has any angina or arrhythmias  during exercise.  Echo: 02/2016  Study Conclusions   - Left ventricle: Abnormal septal motion The cavity size was mildly    dilated. Wall thickness was increased in a pattern of mild LVH.    Systolic function was normal. The estimated ejection fraction was    in the range of 50% to 55%. Doppler parameters are consistent    with abnormal left ventricular relaxation (grade 1 diastolic    dysfunction).  - Aortic valve: There was trivial regurgitation.  - Mitral valve: Calcified annulus. Mildly thickened leaflets .    There was mild regurgitation.  - Atrial septum: No defect or patent foramen ovale was identified.   Laboratory Data:  High Sensitivity Troponin:    Recent Labs  Lab 09/18/21 2108 09/19/21 0001  TROPONINIHS 93* 115*     Chemistry Recent Labs  Lab 09/18/21 2108 09/20/21 0319  NA 132* 130*  K 4.6 4.1  CL 99 98  CO2 24 23  GLUCOSE 112* 113*  BUN  18 18  CREATININE 1.06 1.05  CALCIUM 9.3 8.5*  MG  --  1.9  GFRNONAA >60 >60  ANIONGAP 9 9    Recent Labs  Lab 09/19/21 1858 09/20/21 0319  PROT 5.6* 5.4*  ALBUMIN 3.1* 3.0*  AST 12* 11*  ALT 9 7  ALKPHOS 48 51  BILITOT 0.8 0.9   Lipids No results for input(s): CHOL, TRIG, HDL, LABVLDL, LDLCALC, CHOLHDL in the last 168 hours.  Hematology Recent Labs  Lab 09/18/21 2108 09/20/21 0319  WBC 7.0  --   RBC 3.41* 3.03*  HGB 11.9*  --   HCT 35.5*  --   MCV 104.1*  --   MCH 34.9*  --   MCHC 33.5  --   RDW 14.7  --   PLT 120*  --    Thyroid  Recent Labs  Lab 09/20/21 0319  TSH 8.620*    BNP Recent Labs  Lab 09/19/21 0001  BNP 1,425.5*    DDimer No results for input(s): DDIMER in the last 168 hours.   Radiology/Studies:  DG Chest 2 View  Result Date: 09/18/2021 CLINICAL DATA:  Chest pain shortness of breath EXAM: CHEST - 2 VIEW COMPARISON:  12/09/2017 FINDINGS: Small left greater than right pleural effusions. Basilar airspace disease. Normal cardiac size. Aortic atherosclerosis. No pneumothorax IMPRESSION: Small left greater than right pleural effusions with basilar atelectasis or pneumonia Electronically Signed   By: Donavan Foil M.D.   On: 09/18/2021 21:17   CT Angio Chest PE W and/or Wo Contrast  Result Date: 09/19/2021 CLINICAL DATA:  Shortness of breath for 1 week EXAM: CT ANGIOGRAPHY CHEST WITH CONTRAST TECHNIQUE: Multidetector CT imaging of the chest was performed using the standard protocol during bolus administration of intravenous contrast. Multiplanar CT image reconstructions and MIPs were obtained to evaluate the vascular anatomy. CONTRAST:  73m OMNIPAQUE IOHEXOL 350 MG/ML SOLN COMPARISON:  Chest x-ray from the previous day. FINDINGS:  Cardiovascular: Atherosclerotic calcifications of the aorta are noted without significant aneurysmal dilatation. The degree of opacification of the aorta is limited precluding evaluation for dissection. The pulmonary artery shows a normal branching pattern without intraluminal filling defect to suggest pulmonary embolism. Coronary calcifications seen. Mediastinum/Nodes: Thoracic inlet is within normal limits. No sizable hilar or mediastinal adenopathy is noted. The esophagus as visualized is within normal limits. Lungs/Pleura: Large bilateral pleural effusions are noted left slightly greater than right. Some basilar atelectasis is noted bilaterally. Emphysematous changes are noted. No focal confluent infiltrate is seen. No pneumothorax is noted. Upper Abdomen: Visualized upper abdomen is unremarkable. Musculoskeletal: Degenerative changes of the thoracic spine are noted. No acute rib abnormality is seen. Review of the MIP images confirms the above findings. IMPRESSION: No evidence of pulmonary emboli. Large bilateral pleural effusions with only minimal atelectasis in the bases. Aortic Atherosclerosis (ICD10-I70.0) and Emphysema (ICD10-J43.9). Electronically Signed   By: MInez CatalinaM.D.   On: 09/19/2021 00:44     Assessment and Plan:   NCOLBIN JOVELis a 87y.o. male with a hx of diffuse CAD (medical management), hypertension, anemia, asthma/COPD, GERD, EtOH use and hypothyroidism who is being seen 09/20/2021 for the evaluation of elevated troponin/CHF at the request of Dr. AKarleen Hampshire  Acute CHF: Etiology is unclear.  Prior echo with diastolic dysfunction.  On admission BNP 1425.  CT angio with bilateral effusions.  Currently treated with IV Lasix 20 mg daily, no I&Os documented but reports UOP.  IR consulted by primary for thoracentesis --Echo pending --Continue diuresis, follow response  Elevated troponin: EKG without acute ST/TW abnormalities, chronic left bundle branch block. hsTn 93>>115.  Does  report some chest tightness prior to admission but states this improved with a breathing treatment.  No further episodes this morning.  He does have history of CAD on cath from 2010 which was managed medically. Given his advanced age would not anticipate invasive work up unless significant abnormality noted on echo, or enzyme leak.  -- continue ASA  -- Echo pending  Hypothyroidism: TSH 8.6 -- on synthroid  -- management per primary  New onset Paroxysmal Afib: brief episode noted on telemetry yesterday. He denies any palpitations or fluttering PTA.  -- CHA2DS2-VASc Score of at least 4 -- start IV heparin as he is pending IR consult for thoracentesis   HTN: BP meds held on admission as BPs were soft -- stable, resume as tolerated   COPD/Asthma: No wheezing on exam. Emphysematous changes noted on CT chest -- nebs  Thrombocytopenia: mild,no reported bleeding -- follow CBC  Risk Assessment/Risk Scores:   New York Heart Association (NYHA) Functional Class NYHA Class II   For questions or updates, please contact [facility] Please consult [email or web address] for contact info under    Signed, Reino Bellis, NP  09/20/2021 7:29 AM   Personally seen and examined. Agree with above.  87 year old male with recent worsening shortness of breath, NYHA class III-IV symptoms.  Previously very functional driving mowing his lawn for instance.  He is here with his son who attests to this.  CT of chest showed large bilateral pleural effusions left greater than right.  Currently breathing better after left sided thoracentesis today.  GEN: Looks younger than stated age.  Well nourished, well developed, in no acute distress HEENT: normal, hard of hearing Neck: no JVD, carotid bruits, or masses Cardiac: RRR; no murmurs, rubs, or gallops,no edema  Respiratory: Decreased breath sounds at bases now right greater than left postthoracentesis of left, normal work of breathing GI: soft, nontender,  nondistended, + BS MS: no deformity or atrophy Skin: warm and dry, no rash Neuro:  Alert and Oriented x 3, Strength and sensation are intact Psych: euthymic mood, full affect  Telemetry personally reviewed demonstrates brief episode of atrial fibrillation, flutter with brief rapid ventricular response in the 140s mostly rate controlled.  Spontaneously converted back to sinus rhythm with first-degree AV block.  Cardiac catheterization reviewed as above from 2010.  Troponin 115 flat.  Sodium 130 creatinine 1.05 albumin 3.0 hemoglobin 11.9 TSH 8.6 BNP 1400.  Echo preliminarily with markedly decreased ejection fraction  Assessment and plan:  87 year old with acute systolic heart failure, bilateral pleural effusions secondary to heart failure, paroxysmal atrial fibrillation, demand ischemia  -He is post left-sided thoracentesis today with 800 cc of fluid out.  Feeling better.  Going for thoracentesis again tomorrow for right side he states.  He did receive an isolated Eliquis 2.5 mg dose today at approximately 10:50 AM.  This was his first dose of Eliquis ever.  He may proceed with thoracentesis tomorrow without any difficulty from a bleeding perspective.  This will not affect his bleeding profile over the next 24 hours.  He has not been loaded. -After thoracentesis tomorrow, we will go ahead and initiate Eliquis 2.5 mg twice a day for stroke protection.  Discussed with he and his son.  They understand. -We will also try to initiate goal-directed medical therapy for cardiomyopathy to the best of our abilities.  For now continue with diuresis with IV Lasix. -I will start Toprol-XL  25 mg once a day. -I will start spironolactone 12.5 mg once a day.  We will continue to follow closely.  When we do start Eliquis again tomorrow, we will stop his aspirin 81 mg.  Candee Furbish, MD

## 2021-09-20 NOTE — Procedures (Signed)
PROCEDURE SUMMARY:  Successful US guided left thoracentesis. Yielded 800 mL of clear yellow fluid. Patient tolerated procedure well. No immediate complications. EBL = trace  Specimen was sent for labs.  Post procedure chest X-ray pending.  Nedra Mcinnis S Mayanna Garlitz PA-C 09/20/2021 9:53 AM

## 2021-09-20 NOTE — Progress Notes (Signed)
  Echocardiogram 2D Echocardiogram has been performed.  Albert Allen 09/20/2021, 11:08 AM

## 2021-09-20 NOTE — Progress Notes (Addendum)
Initial Nutrition Assessment  DOCUMENTATION CODES:   Not applicable  INTERVENTION:   Recommend liberalize diet to regular d/t poor nutrition status, suspected malnutrition. Ensure Enlive po BID, each supplement provides 350 kcal and 20 grams of protein. MVI with minerals daily. Recommend iron supplementation given low ferritin level.   NUTRITION DIAGNOSIS:   Inadequate oral intake related to decreased appetite as evidenced by per patient/family report.  GOAL:   Patient will meet greater than or equal to 90% of their needs  MONITOR:   PO intake, Supplement acceptance  REASON FOR ASSESSMENT:   Malnutrition Screening Tool    ASSESSMENT:   87 yo male admitted with CHF exacerbation. PMH includes anemia, B-12 deficiency, alcoholic polyneuropathy, COPD, HTN, HLD, hypothyroidism, LBBB, GERD, CHF.  S/P L thoracentesis with removal of 800 ml clear yellow fluid today.   Labs reviewed. Na 130 (L), Ferritin 19 (L), Folate 17.5 WNL, vitamin B-12 539 WNL  Medications reviewed and include Lasix, Protonix, Aldactone, Flomax, Vitamin B-12 tablet daily.  Weight history reviewed. 7% weight loss over the past year is not significant. However, BMI=19.55 is below the preferred range for patient's age.   Currently on a heart healthy diet.  Meal intakes not recorded.  On admission, patient/family reported recent poor intake r/t decreased appetite.   Suspect malnutrition, but unable to obtain enough information at this time for identification of malnutrition.   NUTRITION - FOCUSED PHYSICAL EXAM:  Unable to complete  Diet Order:   Diet Order             Diet Heart Room service appropriate? Yes; Fluid consistency: Thin  Diet effective now                   EDUCATION NEEDS:   Not appropriate for education at this time  Skin:  Skin Assessment: Reviewed RN Assessment  Last BM:  10/20  Height:   Ht Readings from Last 1 Encounters:  09/19/21 5\' 7"  (1.702 m)    Weight:    Wt Readings from Last 1 Encounters:  09/20/21 56.6 kg    BMI:  Body mass index is 19.55 kg/m.  Estimated Nutritional Needs:   Kcal:  1800-2000  Protein:  85-100 gm  Fluid:  1.7-1.8 L    Lucas Mallow, RD, LDN, CNSC Please refer to Amion for contact information.

## 2021-09-20 NOTE — Progress Notes (Addendum)
ANTICOAGULATION CONSULT NOTE - Initial Consult  Pharmacy Consult for Eliquis Indication: atrial fibrillation  Allergies  Allergen Reactions   Amoxicillin Swelling and Other (See Comments)    Pt has swelling with PCN's   Penicillins Swelling    Lips swelling Has patient had a PCN reaction causing immediate rash, facial/tongue/throat swelling, SOB or lightheadedness with hypotension: No Has patient had a PCN reaction causing severe rash involving mucus membranes or skin necrosis: No Has patient had a PCN reaction that required hospitalization No Has patient had a PCN reaction occurring within the last 10 years: No If all of the above answers are "NO", then may proceed with Cephalosporin use.   Ceclor [Cefaclor] Other (See Comments)    Pt does not remember reaction   Celebrex [Celecoxib] Other (See Comments)    Pt does not remember reaction   Levaquin [Levofloxacin In D5w] Other (See Comments)    Pt does not remember reaction    Patient Measurements: Height: _0  (170.2 cm) Weight: 56.6 kg (124 lb 12.8 oz) IBW/kg (Calculated) : 66.1  Vital Signs: Temp: 98.1 F (36.7 C) (10/22 0749) Temp Source: Oral (10/22 0749) BP: 139/76 (10/22 0749) Pulse Rate: 105 (10/22 0749)  Labs: Recent Labs    09/18/21 2108 09/19/21 0001 09/20/21 0319  HGB 11.9*  --   --   HCT 35.5*  --   --   PLT 120*  --   --   CREATININE 1.06  --  1.05  TROPONINIHS 93* 115*  --     Estimated Creatinine Clearance: 32.9 mL/min (by C-G formula based on SCr of 1.05 mg/dL).   Medical History: Past Medical History:  Diagnosis Date   ABNORMAL CV (STRESS) TEST 03/05/2009   ABUSE, ALCOHOL, IN REMISSION 05/31/2007   Adhesive capsulitis of shoulder 0/94/0768   Alcoholic polyneuropathy ([place]) 03/29/2008   ANEMIA, B12 DEFICIENCY 03/29/2008   Anxiety state, unspecified 11/19/2008   ASTHMA 05/31/2007   Asthma    CAD, NATIVE VESSEL 04/04/2009   CARPAL TUNNEL SYNDROME, RIGHT 09/05/2007   Cataract    removed bilaterally    Chronic prostatitis 03/29/2008   COPD (chronic obstructive pulmonary disease) ([place])    "they told me I have a touch of COPD"   Crohn's disease ([place])    Degenerative disc disease, cervical    DEGENERATIVE DISC DISEASE, CERVICAL SPINE 06/22/2007   Diverticulosis    DYSPHAGIA UNSPECIFIED 09/25/2008   Esophageal stricture    External hemorrhoids    GERD 05/31/2007   GOUT 05/31/2007   Hepatomegaly 09/25/2008   Hiatal hernia    HIP PAIN 03/20/2010   History of echocardiogram    Echo 4/17 - mild LVH, EF 50-55%, Gr 1 DD, trivial AI, MAC, mild MR   HYPERLIPIDEMIA 03/27/2009   HYPERTENSION 05/31/2007   HYPOTHYROIDISM 05/31/2007   LEFT BUNDLE BRANCH BLOCK 02/11/2009   Liver hemangioma    Liver mass, left lobe    LOC OSTEOARTHROS NOT SPEC PRIM/SEC LOWER LEG 03/20/2010   OSTEOARTHRITIS 05/31/2007   PERSONAL HX COLONIC POLYPS 03/05/1997   adenomatous    PSA, INCREASED 03/20/2010   SINUSITIS, CHRONIC NOS 05/31/2007   Sore throat    CURRENTLY   SYNCOPE 11/19/2008   WEIGHT LOSS, RECENT 11/19/2008    Medications:  Scheduled:   aspirin EC  81 mg Oral Daily   enoxaparin (LOVENOX) injection  30 mg Subcutaneous Q24H   furosemide  20 mg Intravenous Daily   influenza vaccine adjuvanted  0.5 mL Intramuscular Tomorrow-1000   levothyroxine  88 mcg  Oral Q0600   lidocaine (PF)       linaclotide  145 mcg Oral QAC breakfast   pantoprazole  40 mg Oral Daily   sodium chloride flush  3 mL Intravenous Q12H   tamsulosin  0.4 mg Oral Daily   vitamin B-12  100 mcg Oral Daily   Infusions:   sodium chloride      Assessment: 87 yo M with history of diffuse CAD (medical management), HTN, anemia, asthma/COPD, GERD, EtOH use, and hypothyroidism. Patient presented on 09/19/2021 with acute CHF (ECHO pending), elevated troponin, and new onset atrial fibrillation.   Patient was not on anticoagulation PTA. Patient underwent left thoracentesis on 09/20/2021. Pharmacy consulted for Eliquis dosing.   Goal of Therapy:  Monitor  platelets by anticoagulation protocol: Yes   Plan:  Start Eliquis 2.72m PO BID.  Monitor CBC and for signs/symptoms of bleeding.    AVance Peper PharmD PGY1 Pharmacy Resident Phone [phone]/22/2022 10:08 AM   Please check AMION for all [place] numbers After 10:00 PM, call [phone]

## 2021-09-21 DIAGNOSIS — G621 Alcoholic polyneuropathy: Secondary | ICD-10-CM | POA: Diagnosis not present

## 2021-09-21 DIAGNOSIS — I5041 Acute combined systolic (congestive) and diastolic (congestive) heart failure: Secondary | ICD-10-CM

## 2021-09-21 DIAGNOSIS — I5021 Acute systolic (congestive) heart failure: Secondary | ICD-10-CM

## 2021-09-21 DIAGNOSIS — I509 Heart failure, unspecified: Secondary | ICD-10-CM | POA: Diagnosis not present

## 2021-09-21 LAB — BASIC METABOLIC PANEL
Anion gap: 7 (ref 5–15)
BUN: 21 mg/dL (ref 8–23)
CO2: 25 mmol/L (ref 22–32)
Calcium: 8.5 mg/dL — ABNORMAL LOW (ref 8.9–10.3)
Chloride: 96 mmol/L — ABNORMAL LOW (ref 98–111)
Creatinine, Ser: 1.03 mg/dL (ref 0.61–1.24)
GFR, Estimated: >60 mL/min (ref >60)
Glucose, Bld: 103 mg/dL — ABNORMAL HIGH (ref 70–99)
Potassium: 4.2 mmol/L (ref 3.5–5.1)
Sodium: 128 mmol/L — ABNORMAL LOW (ref 135–145)

## 2021-09-21 LAB — URINALYSIS, ROUTINE W REFLEX MICROSCOPIC
Bilirubin Urine: NEGATIVE
Glucose, UA: NEGATIVE mg/dL
Hgb urine dipstick: NEGATIVE
Ketones, ur: NEGATIVE mg/dL
Leukocytes,Ua: NEGATIVE
Nitrite: NEGATIVE
Protein, ur: NEGATIVE mg/dL
Specific Gravity, Urine: 1.011 (ref 1.005–1.030)
pH: 5 (ref 5.0–8.0)

## 2021-09-21 LAB — CORTISOL: Cortisol, Plasma: 16.3 ug/dL

## 2021-09-21 LAB — T4, FREE: Free T4: 1.02 ng/dL (ref 0.61–1.12)

## 2021-09-21 LAB — OSMOLALITY: Osmolality: 271 mOsm/kg — ABNORMAL LOW (ref 275–295)

## 2021-09-21 MED ORDER — HEPARIN (PORCINE) 25000 UT/250ML-% IV SOLN
700.0000 [IU]/h | INTRAVENOUS | Status: DC
  Administered 2021-09-21: 700 [IU]/h via INTRAVENOUS
  Filled 2021-09-21: qty 250

## 2021-09-21 NOTE — Progress Notes (Signed)
PROGRESS NOTE    KONNAR BEN  YSA:[id number] DOB: 12-17-1922 DOA: 09/18/2021 PCP: [name], MD    Chief Complaint  Patient presents with   Shortness of Breath    Brief Narrative:    Albert Allen is a 87 y.o. male with medical history significant of anemia, alcoholic polyneuropathy, COPD, hypertension, hypothyroidism,LBBB, gerd presents to ED for sob for one week. He denies any chest pain, reports feeling sob and some tightness on ambulation. CT angio of the chest showed large bilateral effusions with minimal atelectasis. No PE.  He was referred to [facility] for admission for CHF exacerbation.   Assessment & Plan:   Active Problems:   Hypothyroidism   Hyperlipidemia   ANEMIA, B12 DEFICIENCY   Alcoholic polyneuropathy (HCC)   Essential hypertension   GERD   Acute combined systolic and diastolic heart failure (HCC)   Acute on chronic diastolic heart failure Gentle diuresis with IV Lasix 20 mg, with strict intake and output and daily weights. Echocardiogram showed LVEF of 20 to 25%, with decreased function, The left ventricle demonstrates regional wall motion abnormalities (see scoring diagram/findings for description). Left ventricular diastolic parameters are indeterminate. Elevated left ventricular end-diastolic pressure. IR consulted for thoracentesis and left pleural fluid aspirated and sent for analysis. Patient continues to be on room air with good saturations. Pt currently on spironolactone and low dose metoprolol. Cardiology on board and appreciate recommendations.  Plan for medical management.    Elevated troponins probably from demand ischemia from acute CHF.  Episode of atrial flutter/fib yesterday around 5: 17 p.m. Patient was started on IV heparin for anticoagulation transition to eliquis post thoracentesis.  Patient currently in sinus rhythm.    Hypothyroidism TSH elevated at 8, continue with Synthroid, free t4 levels wnl.    COPD No wheezing  on exam Continue with duo nebs.  Anxiety Continue with Xanax.   Mild macrocytic anemia Therapy evaluations showing iron deficiency anemia Iron supplementation will be added piror to discharge.   GERD  Stable on protonix.    BPH:  On flomax.   Hyponatremia:  Worsening sodium,  SIADH, will need fluid restriction.  Urine sodium is 75 and urien osmo is 263. Am cortisol level adequate.     DVT prophylaxis: heparin.  Code Status: DNR Family Communication: none at bedside.  Disposition:   Status is: Inpatient  Remains inpatient appropriate because: IV lasix, echocardiogram, therapy evals. IV heparin.        Consultants:  CARDIOLOGY IR   Procedures: echocardiogram.  Thoracentesis on the left Successful ultrasound guided left thoracentesis yielding 800 mL of pleural fluid.  Antimicrobials: none.    Subjective: Breathing has improved, no chest pain , no Nausea, vomiting or abd pain.   Objective: Vitals:   09/20/21 1827 09/21/21 0601 09/21/21 0811 09/21/21 0923  BP: 114/63 112/67 104/60 105/60  Pulse: 74 82 74 76  Resp: 18 16 18    Temp: 97.6 F (36.4 C) 98.1 F (36.7 C)    TempSrc: Oral Oral    SpO2: 98% 98% 98%   Weight:  53.1 kg    Height:        Intake/Output Summary (Last 24 hours) at 09/21/2021 1110 Last data filed at 09/21/2021 0900 Gross per 24 hour  Intake 240 ml  Output 350 ml  Net -110 ml   Filed Weights   09/19/21 1719 09/20/21 0450 09/21/21 0601  Weight: 54.8 kg 56.6 kg 53.1 kg    Examination:  General exam: Appears calm and comfortable  Respiratory system: Clear to auscultation. Respiratory effort normal. Cardiovascular system: S1 & S2 heard, RRR. No JVD,  No pedal edema. Gastrointestinal system: Abdomen is nondistended, soft and nontender. . Normal bowel sounds heard. Central nervous system: Alert and oriented. No focal neurological deficits. Extremities: Symmetric 5 x 5 power. Skin: No rashes, lesions or ulcers Psychiatry:   Mood & affect appropriate.      Data Reviewed: I have personally reviewed following labs and imaging studies  CBC: Recent Labs  Lab 09/18/21 2108  WBC 7.0  HGB 11.9*  HCT 35.5*  MCV 104.1*  PLT 120*     Basic Metabolic Panel: Recent Labs  Lab 09/18/21 2108 09/20/21 0319 09/21/21 0220  NA 132* 130* 128*  K 4.6 4.1 4.2  CL 99 98 96*  CO2 24 23 25   GLUCOSE 112* 113* 103*  BUN 18 18 21   CREATININE 1.06 1.05 1.03  CALCIUM 9.3 8.5* 8.5*  MG  --  1.9  --      GFR: Estimated Creatinine Clearance: 31.5 mL/min (by C-G formula based on SCr of 1.03 mg/dL).  Liver Function Tests: Recent Labs  Lab 09/19/21 1858 09/20/21 0319  AST 12* 11*  ALT 9 7  ALKPHOS 48 51  BILITOT 0.8 0.9  PROT 5.6* 5.4*  ALBUMIN 3.1* 3.0*     CBG: No results for input(s): GLUCAP in the last 168 hours.   Recent Results (from the past 240 hour(s))  Resp Panel by RT-PCR (Flu A&B, Covid) Nasopharyngeal Swab     Status: None   Collection Time: 09/19/21  1:48 AM   Specimen: Nasopharyngeal Swab; Nasopharyngeal(NP) swabs in vial transport medium  Result Value Ref Range Status   SARS Coronavirus 2 by RT PCR NEGATIVE NEGATIVE Final    Comment: (NOTE) SARS-CoV-2 target nucleic acids are NOT DETECTED.  The SARS-CoV-2 RNA is generally detectable in upper respiratory specimens during the acute phase of infection. The lowest concentration of SARS-CoV-2 viral copies this assay can detect is 138 copies/mL. A negative result does not preclude SARS-Cov-2 infection and should not be used as the sole basis for treatment or other patient management decisions. A negative result may occur with  improper specimen collection/handling, submission of specimen other than nasopharyngeal swab, presence of viral mutation(s) within the areas targeted by this assay, and inadequate number of viral copies(<138 copies/mL). A negative result must be combined with clinical observations, patient history, and  epidemiological information. The expected result is Negative.  Fact Sheet for Patients:  EntrepreneurPulse.com.au  Fact Sheet for Healthcare Providers:  IncredibleEmployment.be  This test is no t yet approved or cleared by the [place] FDA and  has been authorized for detection and/or diagnosis of SARS-CoV-2 by FDA under an Emergency Use Authorization (EUA). This EUA will remain  in effect (meaning this test can be used) for the duration of the COVID-19 declaration under Section 564(b)(1) of the Act, 21 U.S.C.section 360bbb-3(b)(1), unless the authorization is terminated  or revoked sooner.       Influenza A by PCR NEGATIVE NEGATIVE Final   Influenza B by PCR NEGATIVE NEGATIVE Final    Comment: (NOTE) The Xpert Xpress SARS-CoV-2/FLU/RSV plus assay is intended as an aid in the diagnosis of influenza from Nasopharyngeal swab specimens and should not be used as a sole basis for treatment. Nasal washings and aspirates are unacceptable for Xpert Xpress SARS-CoV-2/FLU/RSV testing.  Fact Sheet for Patients: EntrepreneurPulse.com.au  Fact Sheet for Healthcare Providers: IncredibleEmployment.be  This test is not yet approved or cleared by  the [employer] and has been authorized for detection and/or diagnosis of SARS-CoV-2 by FDA under an Emergency Use Authorization (EUA). This EUA will remain in effect (meaning this test can be used) for the duration of the COVID-19 declaration under Section 564(b)(1) of the Act, 21 U.S.C. section 360bbb-3(b)(1), unless the authorization is terminated or revoked.  Performed at [facility], [address] [place], [place] [address]   Gram stain     Status: None   Collection Time: 09/20/21 11:46 AM   Specimen: Pleura  Result Value Ref Range Status   Specimen Description PLEURAL FLUID  Final   Special Requests LUNG LEFT LOWER LOBE  Final    Gram Stain   Final    WBC PRESENT,BOTH PMN AND MONONUCLEAR NO ORGANISMS SEEN CYTOSPIN SMEAR Performed at [facility], [address] [place], [place] [address]    Report Status 09/20/2021 FINAL  Final          Radiology Studies: DG Chest 1 View  Result Date: 09/20/2021 CLINICAL DATA:  Thoracentesis post op EXAM: CHEST  1 VIEW COMPARISON:  09/18/2021 FINDINGS: No pneumothorax. Skin fold overlies the left lateral hemithorax. Residual small right pleural effusion. Patchy atelectasis/infiltrate at the right lung base. Heart size and mediastinal contours are within normal limits. Aortic Atherosclerosis (ICD10-170.0). Bilateral shoulder DJD IMPRESSION: No pneumothorax post thoracentesis. Electronically Signed   By: Lucrezia Europe M.D.   On: 09/20/2021 11:21   US Abdomen Complete  Result Date: 09/20/2021 CLINICAL DATA:  87 year old male history of abdominal pain. EXAM: ABDOMEN ULTRASOUND COMPLETE COMPARISON:  CT abdomen pelvis from 09/08/2018 FINDINGS: Gallbladder: No gallstones or wall thickening visualized. No sonographic Murphy sign noted by sonographer. Common bile duct: Diameter: 5 mm Liver: Ill-defined hyperechoic subcapsular mass visualized in the right dome of the liver compatible with previously visualized angioma. No new suspicious hepatic lesions. Within normal limits in parenchymal echogenicity. Portal vein is patent on color Doppler imaging with normal direction of blood flow towards the liver. IVC: No abnormality visualized. Pancreas: Not well visualized. Spleen: Size and appearance within normal limits. Right Kidney: Length: 9.8 cm. Echogenicity within normal limits. No mass or hydronephrosis visualized. Left Kidney: Length: 9.3 cm. Echogenicity within normal limits. No mass or hydronephrosis visualized. Abdominal aorta: Not well visualized. Other findings: None. IMPRESSION: No acute abnormality in the visualized abdomen. Ruthann Cancer, MD [facility] Radiology Electronically Signed   By: Ruthann Cancer M.D.   On: 09/20/2021 12:55   ECHOCARDIOGRAM COMPLETE  Result Date: 09/20/2021    ECHOCARDIOGRAM REPORT   Patient Name:   Albert Allen Date of Exam: 09/20/2021 Medical Rec #:  [id number]         Height:       67.0 in Accession #:    [id number]        Weight:       124.8 lb Date of Birth:  1923-11-08         BSA:          1.655 m Patient Age:    15 years          BP:           139/76 mmHg Patient Gender: M                 HR:           64 bpm. Exam Location:  Inpatient Procedure: 2D Echo, 3D Echo, Color Doppler, Cardiac Doppler and Intracardiac  Opacification Agent Indications:    I50.1 Left ventricular failure; I50.40* Unspecified combined                 systolic (congestive) and diastolic (congestive) heart failure  History:        Patient has prior history of Echocardiogram examinations, most                 recent 03/18/2016. CAD, Abnormal ECG, Arrythmias:LBBB; Risk                 Factors:Hypertension and Dyslipidemia. ETOH.  Sonographer:    Roseanna Rainbow RDCS Referring Phys: Theola Sequin IMPRESSIONS  1. Left ventricular ejection fraction, by estimation, is 20 to 25%. The left ventricle has severely decreased function. The left ventricle demonstrates regional wall motion abnormalities (see scoring diagram/findings for description). Left ventricular diastolic parameters are indeterminate. Elevated left ventricular end-diastolic pressure.  2. Right ventricular systolic function is normal. The right ventricular size is normal. There is normal pulmonary artery systolic pressure.  3. The mitral valve is grossly normal. Mild to moderate mitral valve regurgitation. No evidence of mitral stenosis.  4. The aortic valve was not well visualized. There is moderate calcification of the aortic valve. Aortic valve regurgitation is trivial. Mild to moderate aortic valve sclerosis/calcification is present, without any evidence of  aortic stenosis.  5. The inferior vena cava is normal in size with greater than 50% respiratory variability, suggesting right atrial pressure of 3 mmHg. Comparison(s): Changes from prior study are noted. Conclusion(s)/Recommendation(s): Severely reduced LVEF with akinesis mid to apical, anterior/septal more than inferior/lateral. Findings discussed with cardiology team seeing him in consultation. FINDINGS  Left Ventricle: Lv thrombus excluded by contrast. Left ventricular ejection fraction, by estimation, is 20 to 25%. The left ventricle has severely decreased function. The left ventricle demonstrates regional wall motion abnormalities. Definity contrast agent was given IV to delineate the left ventricular endocardial borders. The left ventricular internal cavity size was normal in size. There is no left ventricular hypertrophy. Abnormal (paradoxical) septal motion, consistent with left bundle branch block. Left ventricular diastolic parameters are indeterminate. Elevated left ventricular end-diastolic pressure.  LV Wall Scoring: The mid and distal anterior wall, mid and distal anterior septum, entire apex, and mid inferoseptal segment are akinetic. The antero-lateral wall, inferior wall, posterior wall, basal anteroseptal segment, basal anterior segment, and basal inferoseptal segment are hypokinetic. Right Ventricle: The right ventricular size is normal. No increase in right ventricular wall thickness. Right ventricular systolic function is normal. There is normal pulmonary artery systolic pressure. The tricuspid regurgitant velocity is 2.59 m/s, and  with an assumed right atrial pressure of 3 mmHg, the estimated right ventricular systolic pressure is 27.0 mmHg. Left Atrium: Left atrial size was normal in size. Right Atrium: Right atrial size was normal in size. Pericardium: There is no evidence of pericardial effusion. Mitral Valve: The mitral valve is grossly normal. There is mild calcification of the mitral  valve leaflet(s). Mild mitral annular calcification. Mild to moderate mitral valve regurgitation. No evidence of mitral valve stenosis. MV peak gradient, 5.8 mmHg. The mean mitral valve gradient is 3.0 mmHg. Tricuspid Valve: The tricuspid valve is normal in structure. Tricuspid valve regurgitation is trivial. No evidence of tricuspid stenosis. Aortic Valve: The aortic valve was not well visualized. There is moderate calcification of the aortic valve. Aortic valve regurgitation is trivial. Aortic regurgitation PHT measures 793 msec. Mild to moderate aortic valve sclerosis/calcification is present, without any evidence of aortic stenosis. Pulmonic Valve: The  pulmonic valve was not well visualized. Pulmonic valve regurgitation is trivial. No evidence of pulmonic stenosis. Aorta: The aortic root, ascending aorta and aortic arch are all structurally normal, with no evidence of dilitation or obstruction. Venous: The inferior vena cava is normal in size with greater than 50% respiratory variability, suggesting right atrial pressure of 3 mmHg. IAS/Shunts: The atrial septum is grossly normal.  LEFT VENTRICLE PLAX 2D LVIDd:         4.60 cm     Diastology LVIDs:         4.10 cm     LV e' medial:    9.30 cm/s LV PW:         0.80 cm     LV E/e' medial:  10.9 LV IVS:        1.00 cm     LV e' lateral:   6.45 cm/s LVOT diam:     1.95 cm     LV E/e' lateral: 15.7 LV SV:         55 LV SV Index:   33 LVOT Area:     2.99 cm                             3D Volume EF: LV Volumes (MOD)           3D EF:        22 % LV vol d, MOD A2C: 96.1 ml LV EDV:       155 ml LV vol d, MOD A4C: 91.6 ml LV ESV:       120 ml LV vol s, MOD A2C: 74.8 ml LV SV:        34 ml LV vol s, MOD A4C: 77.8 ml LV SV MOD A2C:     21.3 ml LV SV MOD A4C:     91.6 ml LV SV MOD BP:      21.4 ml RIGHT VENTRICLE            IVC RV S prime:     8.78 cm/s  IVC diam: 1.20 cm TAPSE (M-mode): 1.6 cm LEFT ATRIUM           Index        RIGHT ATRIUM           Index LA diam:      3.10  cm 1.87 cm/m   RA Area:     12.40 cm LA Vol (A2C): 26.7 ml 16.13 ml/m  RA Volume:   28.30 ml  17.10 ml/m LA Vol (A4C): 29.5 ml 17.83 ml/m  AORTIC VALVE LVOT Vmax:   99.30 cm/s LVOT Vmean:  62.800 cm/s LVOT VTI:    0.185 m AI PHT:      793 msec  AORTA Ao Root diam: 3.70 cm Ao Asc diam:  2.90 cm MITRAL VALVE                TRICUSPID VALVE MV Area (PHT): 5.38 cm     TR Peak grad:   26.8 mmHg MV Area VTI:   2.24 cm     TR Vmax:        259.00 cm/s MV Peak grad:  5.8 mmHg MV Mean grad:  3.0 mmHg     SHUNTS MV Vmax:       1.20 m/s     Systemic VTI:  0.18 m MV Vmean:      80.8 cm/s    Systemic Diam: 1.95  cm MV Decel Time: 141 msec MV E velocity: 101.00 cm/s Buford Dresser MD Electronically signed by Buford Dresser MD Signature Date/Time: 09/20/2021/11:47:11 AM    Final    US THORACENTESIS ASP PLEURAL SPACE W/IMG GUIDE  Result Date: 09/20/2021 INDICATION: Congestive heart failure with fluid overload and bilateral pleural effusions. Request for diagnostic and therapeutic thoracentesis. EXAM: ULTRASOUND GUIDED LEFT THORACENTESIS MEDICATIONS: 1% lidocaine 10 mL COMPLICATIONS: None immediate. PROCEDURE: An ultrasound guided thoracentesis was thoroughly discussed with the patient and questions answered. The benefits, risks, alternatives and complications were also discussed. The patient understands and wishes to proceed with the procedure. Written consent was obtained. Ultrasound was performed to localize and mark an adequate pocket of fluid in the left chest. The area was then prepped and draped in the normal sterile fashion. 1% Lidocaine was used for local anesthesia. Under ultrasound guidance a 6 Fr Safe-T-Centesis catheter was introduced. Thoracentesis was performed. The catheter was removed and a dressing applied. FINDINGS: A total of approximately 800 mL of clear yellow fluid was removed. Samples were sent to the laboratory as requested by the clinical team. IMPRESSION: Successful ultrasound guided  left thoracentesis yielding 800 mL of pleural fluid. No pneumothorax on post-procedure chest x-ray. Read by: Gareth Eagle, PA-C Electronically Signed   By: Ruthann Cancer M.D.   On: 09/20/2021 11:02        Scheduled Meds:  aspirin EC  81 mg Oral Daily   feeding supplement  237 mL Oral BID BM   furosemide  20 mg Intravenous Daily   influenza vaccine adjuvanted  0.5 mL Intramuscular Tomorrow-1000   levothyroxine  88 mcg Oral Q0600   linaclotide  145 mcg Oral QAC breakfast   metoprolol succinate  25 mg Oral Daily   multivitamin with minerals  1 tablet Oral Daily   pantoprazole  40 mg Oral Daily   sodium chloride flush  3 mL Intravenous Q12H   spironolactone  12.5 mg Oral Daily   tamsulosin  0.4 mg Oral Daily   vitamin B-12  100 mcg Oral Daily   Continuous Infusions:  sodium chloride       LOS: 2 days       Hosie Poisson, MD [facility]   To contact the attending provider between 7A-7P or the covering provider during after hours 7P-7A, please log into the web site [email or web address] and access using universal [place] password for that web site. If you do not have the password, please call the hospital operator.  09/21/2021, 11:10 AM

## 2021-09-21 NOTE — Evaluation (Signed)
Occupational Therapy Evaluation Patient Details Name: Albert Allen MRN: [id number] DOB: Jul 01, 1923 Today's Date: 09/21/2021   History of Present Illness Patient is a 87 y/o male who presents on 09/19/21 due to SOB. CT angio of the chest- large bilateral effusions with minimal atelectasis. s/p left thoracentesis 10/22 and plan for Rt thoracentesis 10/23. Also with CHF exacerbation. PMH includes anemia, alcoholic polyneuropathy, COPD, HTN, LBBB.   Clinical Impression   [name] was indep PTA, drives and completes all IADLs. He lives alone in a 1 level, level entry home with family close by to assist if needed. Currently pt is requiring up to min A for ADLs and min guard for functional mobility. He is limited by fatigue, generalized weakness and activity tolerance. Pt would benefit from continued OT acutely to progress towards his indep baseline prior to going home. Recommend home with supervision intermittently.      Recommendations for follow up therapy are one component of a multi-disciplinary discharge planning process, led by the attending physician.  Recommendations may be updated based on patient status, additional functional criteria and insurance authorization.   Follow Up Recommendations  No OT follow up;Supervision - Intermittent    Equipment Recommendations  None recommended by OT       Precautions / Restrictions Precautions Precautions: Fall;Other (comment) Precaution Comments: watch BP Restrictions Weight Bearing Restrictions: No      Mobility Bed Mobility Overal bed mobility: Modified Independent             General bed mobility comments: HOB elevated, use of rail. +incrased time    Transfers Overall transfer level: Needs assistance Equipment used: None Transfers: Sit to/from Stand Sit to Stand: Min guard         General transfer comment: for safety    Balance Overall balance assessment: Needs assistance Sitting-balance support: Feet supported;No  upper extremity supported Sitting balance-Leahy Scale: Good     Standing balance support: During functional activity Standing balance-Leahy Scale: Fair Standing balance comment: Able to stand unsupported but does better with UE support for ambulation.                           ADL either performed or assessed with clinical judgement   ADL Overall ADL's : Needs assistance/impaired Eating/Feeding: Set up;Sitting   Grooming: Set up;Sitting   Upper Body Bathing: Set up;Sitting   Lower Body Bathing: Minimal assistance;Sit to/from stand   Upper Body Dressing : Set up;Sitting   Lower Body Dressing: Minimal assistance;Sit to/from stand   Toilet Transfer: Min guard;Ambulation   Toileting- Clothing Manipulation and Hygiene: Supervision/safety       Functional mobility during ADLs: Min guard General ADL Comments: Pt is limited by generalized weakness, fatigue, SOB and insight into safety - pt not feeling up to completing full ADL session     Vision Baseline Vision/History: 0 No visual deficits Ability to See in Adequate Light: 0 Adequate Vision Assessment?: No apparent visual deficits     Perception     Praxis      Pertinent Vitals/Pain Pain Assessment: Faces Faces Pain Scale: Hurts a little bit Pain Location: generalized with movement Pain Descriptors / Indicators: Grimacing     Hand Dominance Right   Extremity/Trunk Assessment Upper Extremity Assessment Upper Extremity Assessment: Generalized weakness   Lower Extremity Assessment Lower Extremity Assessment: Generalized weakness   Cervical / Trunk Assessment Cervical / Trunk Assessment: Kyphotic   Communication Communication Communication: HOH   Cognition Arousal/Alertness: Awake/alert Behavior  During Therapy: WFL for tasks assessed/performed Overall Cognitive Status: Within Functional Limits for tasks assessed                                     General Comments  VSS on RA, SpO2  at 99-100% but pt with some report of mild SOB    Exercises     Shoulder Instructions      Home Living Family/patient expects to be discharged to:: Private residence Living Arrangements: Alone Available Help at Discharge: Family;Available PRN/intermittently Type of Home: House Home Access: Level entry     Home Layout: One level     Bathroom Shower/Tub: Teacher, early years/pre: Standard     Home Equipment: Environmental consultant - 2 wheels;Shower seat;Bedside commode          Prior Functioning/Environment Level of Independence: Independent        Comments: Independent with ADLs/IADLs, drives, mows lawn, cooks, cleans; No falls reported.        OT Problem List: Decreased strength;Decreased range of motion;Decreased activity tolerance;Impaired balance (sitting and/or standing);Cardiopulmonary status limiting activity      OT Treatment/Interventions: Self-care/ADL training;Therapeutic exercise;Therapeutic activities;Balance training;Patient/family education    OT Goals(Current goals can be found in the care plan section) Acute Rehab OT Goals Patient Stated Goal: get better and go home OT Goal Formulation: With patient Time For Goal Achievement: 10/05/21 Potential to Achieve Goals: Fair ADL Goals Pt Will Perform Grooming: Independently;standing Pt Will Transfer to Toilet: Independently;ambulating Pt Will Perform Tub/Shower Transfer: with modified independence;shower seat;ambulating Pt/caregiver will Perform Home Exercise Program: Increased strength;Both right and left upper extremity;With written HEP provided Additional ADL Goal #1: pt will tolerate at least 5 minutes of OOB functional activity to improve activity tolerance for ADLs  OT Frequency: Min 2X/week   Barriers to D/C:            Co-evaluation              AM-PAC OT "6 Clicks" Daily Activity     Outcome Measure Help from another person eating meals?: None Help from another person taking care of  personal grooming?: A Little Help from another person toileting, which includes using toliet, bedpan, or urinal?: A Little Help from another person bathing (including washing, rinsing, drying)?: A Little Help from another person to put on and taking off regular upper body clothing?: None Help from another person to put on and taking off regular lower body clothing?: A Little 6 Click Score: 20   End of Session Nurse Communication: Mobility status  Activity Tolerance: Patient tolerated treatment well Patient left: in bed;with call bell/phone within reach;with family/visitor present  OT Visit Diagnosis: Other abnormalities of gait and mobility (R26.89);Muscle weakness (generalized) (M62.81)                Time: 1434-1450 OT Time Calculation (min): 16 min Charges:  OT Evaluation $OT Eval Moderate Complexity: 1 Mod   Tonyia Marschall A Amauri Medellin 09/21/2021, 3:24 PM

## 2021-09-21 NOTE — Assessment & Plan Note (Signed)
Treating with medications.

## 2021-09-21 NOTE — Evaluation (Addendum)
Physical Therapy Evaluation Patient Details Name: Albert Allen MRN: [id number] DOB: 01-21-1923 Today's Date: 09/21/2021  History of Present Illness  Patient is a 87 y/o male who presents on 09/19/21 due to SOB. CT angio of the chest- large bilateral effusions with minimal atelectasis. s/p left thoracentesis 10/22 and plan for Rt thoracentesis 10/23. Also with CHF exacerbation. PMH includes anemia, alcoholic polyneuropathy, COPD, HTN, LBBB.  Clinical Impression  Patient presents with generalized deconditioning, impaired balance, mild SOB and impaired mobility s/p above. Pt with abnormal BP response to exercise but no dizziness reported. Tolerated transfers and gait training with Min guard assist for balance/safety. Pt walking close to rail in hallway and grabbing it as needed which pt reports is not baseline. May need DME if continues to demonstrate unsteadiness with ambulation. Sp02 >88% on RA during activity. Encouraged walking with nurses daily and walking to bathroom. See BPs below. Supine BP 104/60, Sitting BP 103/58, Standing BP 121/52, sitting BP post activity 105/60, asymptomatic. Will follow acutely to maximize independence and mobility prior to return home.     Recommendations for follow up therapy are one component of a multi-disciplinary discharge planning process, led by the attending physician.  Recommendations may be updated based on patient status, additional functional criteria and insurance authorization.  Follow Up Recommendations Home health PT;Supervision - Intermittent (pending improvement)    Equipment Recommendations  None recommended by PT    Recommendations for Other Services       Precautions / Restrictions Precautions Precautions: Fall;Other (comment) Precaution Comments: watch BP Restrictions Weight Bearing Restrictions: No      Mobility  Bed Mobility Overal bed mobility: Modified Independent             General bed mobility comments: HOB  elevated, use of rail.    Transfers Overall transfer level: Needs assistance Equipment used: None Transfers: Sit to/from Stand Sit to Stand: Min guard         General transfer comment: Min guard for safety, wide BoS.No dizziness.  Ambulation/Gait Ambulation/Gait assistance: Min guard Gait Distance (Feet): 150 Feet Assistive device: None Gait Pattern/deviations: Step-through pattern;Decreased stride length;Wide base of support   Gait velocity interpretation: 1.31 - 2.62 ft/sec, indicative of limited community ambulator General Gait Details: Faster mildly unsteady gait with pt walking close to wall on right side grabbing rail as needed. No dizziness. SP02 >88% on RA.  Stairs            Wheelchair Mobility    Modified Rankin (Stroke Patients Only)       Balance Overall balance assessment: Needs assistance Sitting-balance support: Feet supported;No upper extremity supported Sitting balance-Leahy Scale: Good     Standing balance support: During functional activity Standing balance-Leahy Scale: Fair Standing balance comment: Able to stand unsupported but does better with UE support for ambulation.                             Pertinent Vitals/Pain Pain Assessment: No/denies pain    Home Living Family/patient expects to be discharged to:: Private residence Living Arrangements: Alone Available Help at Discharge: Family;Available PRN/intermittently Type of Home: House Home Access: Level entry     Home Layout: One level Home Equipment: Walker - 2 wheels;Shower seat;Bedside commode      Prior Function Level of Independence: Independent         Comments: Independent with ADLs/IADLs, drives,. mows lawn. No falls reported.     Hand Dominance   Dominant  Hand: Right    Extremity/Trunk Assessment   Upper Extremity Assessment Upper Extremity Assessment: Defer to OT evaluation    Lower Extremity Assessment Lower Extremity Assessment:  Generalized weakness (burning in bil feet constant)       Communication   Communication: HOH  Cognition Arousal/Alertness: Awake/alert Behavior During Therapy: WFL for tasks assessed/performed Overall Cognitive Status: Within Functional Limits for tasks assessed                                        General Comments General comments (skin integrity, edema, etc.): Supine BP104/60, Sitting BP 103/58, Standing BP 121/52, sitting BP post activity 105/60, asymptomatic.    Exercises     Assessment/Plan    PT Assessment Patient needs continued PT services  PT Problem List Decreased strength;Decreased mobility;Decreased balance;Decreased activity tolerance;Cardiopulmonary status limiting activity;Pain       PT Treatment Interventions Therapeutic exercise;Gait training;Patient/family education;Therapeutic activities;Functional mobility training;Balance training    PT Goals (Current goals can be found in the Care Plan section)  Acute Rehab PT Goals Patient Stated Goal: get better and go home PT Goal Formulation: With patient Time For Goal Achievement: 10/05/21 Potential to Achieve Goals: Good    Frequency Min 3X/week   Barriers to discharge        Co-evaluation               AM-PAC PT "6 Clicks" Mobility  Outcome Measure Help needed turning from your back to your side while in a flat bed without using bedrails?: None Help needed moving from lying on your back to sitting on the side of a flat bed without using bedrails?: A Little Help needed moving to and from a bed to a chair (including a wheelchair)?: A Little Help needed standing up from a chair using your arms (e.g., wheelchair or bedside chair)?: A Little Help needed to walk in hospital room?: A Little Help needed climbing 3-5 steps with a railing? : A Little 6 Click Score: 19    End of Session Equipment Utilized During Treatment: Gait belt Activity Tolerance: Patient tolerated treatment  well Patient left: in bed;with call bell/phone within reach;with bed alarm set Nurse Communication: Mobility status PT Visit Diagnosis: Unsteadiness on feet (R26.81);Difficulty in walking, not elsewhere classified (R26.2);Muscle weakness (generalized) (M62.81)    Time: 2956-2130 PT Time Calculation (min) (ACUTE ONLY): 32 min   Charges:   PT Evaluation $PT Eval Moderate Complexity: 1 Mod PT Treatments $Gait Training: 8-22 mins        Marisa Severin, PT, DPT Acute Rehabilitation Services Pager [phone] Office [place] 09/21/2021, 9:49 AM

## 2021-09-21 NOTE — Progress Notes (Signed)
[place] for heparin Indication: atrial fibrillation  Allergies  Allergen Reactions   Amoxicillin Swelling and Other (See Comments)    Pt has swelling with PCN's   Penicillins Swelling    Lips swelling Has patient had a PCN reaction causing immediate rash, facial/tongue/throat swelling, SOB or lightheadedness with hypotension: No Has patient had a PCN reaction causing severe rash involving mucus membranes or skin necrosis: No Has patient had a PCN reaction that required hospitalization No Has patient had a PCN reaction occurring within the last 10 years: No If all of the above answers are "NO", then may proceed with Cephalosporin use.   Ceclor [Cefaclor] Other (See Comments)    Pt does not remember reaction   Celebrex [Celecoxib] Other (See Comments)    Pt does not remember reaction   Levaquin [Levofloxacin In D5w] Other (See Comments)    Pt does not remember reaction    Patient Measurements: Height: _0  (170.2 cm) Weight: 53.1 kg (117 lb) IBW/kg (Calculated) : 66.1 Heparin Dosing Weight: 53kg  Vital Signs: Temp: 98.3 F (36.8 C) (10/23 1328) Temp Source: Oral (10/23 1328) BP: 99/46 (10/23 1328) Pulse Rate: 76 (10/23 1328)  Labs: Recent Labs    09/18/21 2108 09/19/21 0001 09/20/21 0319 09/21/21 0220  HGB 11.9*  --   --   --   HCT 35.5*  --   --   --   PLT 120*  --   --   --   CREATININE 1.06  --  1.05 1.03  TROPONINIHS 93* 115*  --   --     Estimated Creatinine Clearance: 31.5 mL/min (by C-G formula based on SCr of 1.03 mg/dL).   Medical History: Past Medical History:  Diagnosis Date   ABNORMAL CV (STRESS) TEST 03/05/2009   ABUSE, ALCOHOL, IN REMISSION 05/31/2007   Adhesive capsulitis of shoulder 4/94/4967   Alcoholic polyneuropathy ([place]) 03/29/2008   ANEMIA, B12 DEFICIENCY 03/29/2008   Anxiety state, unspecified 11/19/2008   ASTHMA 05/31/2007   Asthma    CAD, NATIVE VESSEL 04/04/2009   CARPAL TUNNEL SYNDROME, RIGHT  09/05/2007   Cataract    removed bilaterally   Chronic prostatitis 03/29/2008   COPD (chronic obstructive pulmonary disease) ([place])    "they told me I have a touch of COPD"   Crohn's disease ([place])    Degenerative disc disease, cervical    DEGENERATIVE DISC DISEASE, CERVICAL SPINE 06/22/2007   Diverticulosis    DYSPHAGIA UNSPECIFIED 09/25/2008   Esophageal stricture    External hemorrhoids    GERD 05/31/2007   GOUT 05/31/2007   Hepatomegaly 09/25/2008   Hiatal hernia    HIP PAIN 03/20/2010   History of echocardiogram    Echo 4/17 - mild LVH, EF 50-55%, Gr 1 DD, trivial AI, MAC, mild MR   HYPERLIPIDEMIA 03/27/2009   HYPERTENSION 05/31/2007   HYPOTHYROIDISM 05/31/2007   LEFT BUNDLE BRANCH BLOCK 02/11/2009   Liver hemangioma    Liver mass, left lobe    LOC OSTEOARTHROS NOT SPEC PRIM/SEC LOWER LEG 03/20/2010   OSTEOARTHRITIS 05/31/2007   PERSONAL HX COLONIC POLYPS 03/05/1997   adenomatous    PSA, INCREASED 03/20/2010   SINUSITIS, CHRONIC NOS 05/31/2007   Sore throat    CURRENTLY   SYNCOPE 11/19/2008   WEIGHT LOSS, RECENT 11/19/2008    Assessment: 66 yoM admitted with CHF and need for thoracentesis. Pt is on apixaban PTA for hx AFib, to transition to heparin with need for possible procedures. Pt s/p thoracentesis 10/22. Last  dose of apixaban was 10/22.   Goal of Therapy:  Heparin level 0.3-0.7 units/ml aPTT 66-102 seconds Monitor platelets by anticoagulation protocol: Yes   Plan:  Heparin 700 units/h no bolus Check 8h aPTT and heparin level  Arrie Senate, PharmD, BCPS, [facility] Clinical Pharmacist [phone] Please check AMION for all [place] numbers 09/21/2021

## 2021-09-21 NOTE — Progress Notes (Addendum)
Progress Note  Patient Name: Albert Allen Date of Encounter: 09/21/2021  [facility] Cardiologist: Lauree Chandler, MD    Subjective   He feels his breathing is better.  He has not had chest pain.    Inpatient Medications    Scheduled Meds:  aspirin EC  81 mg Oral Daily   feeding supplement  237 mL Oral BID BM   furosemide  20 mg Intravenous Daily   influenza vaccine adjuvanted  0.5 mL Intramuscular Tomorrow-1000   levothyroxine  88 mcg Oral Q0600   linaclotide  145 mcg Oral QAC breakfast   metoprolol succinate  25 mg Oral Daily   multivitamin with minerals  1 tablet Oral Daily   pantoprazole  40 mg Oral Daily   sodium chloride flush  3 mL Intravenous Q12H   spironolactone  12.5 mg Oral Daily   tamsulosin  0.4 mg Oral Daily   vitamin B-12  100 mcg Oral Daily   Continuous Infusions:  sodium chloride     PRN Meds: sodium chloride, acetaminophen, albuterol, ALPRAZolam, ipratropium-albuterol, ondansetron (ZOFRAN) IV, sodium chloride flush   Vital Signs    Vitals:   09/20/21 1306 09/20/21 1827 09/21/21 0601 09/21/21 0923  BP: 115/63 114/63 112/67 105/60  Pulse: 79 74 82 76  Resp: (!) $RemoveB'21 18 16   'RoBOjQQa$ Temp: 98.3 F (36.8 C) 97.6 F (36.4 C) 98.1 F (36.7 C)   TempSrc: Oral Oral Oral   SpO2: 99% 98% 98%   Weight:   53.1 kg   Height:        Intake/Output Summary (Last 24 hours) at 09/21/2021 0934 Last data filed at 09/21/2021 0200 Gross per 24 hour  Intake 480 ml  Output 350 ml  Net 130 ml   Last 3 Weights 09/21/2021 09/20/2021 09/19/2021  Weight (lbs) 117 lb 124 lb 12.8 oz 120 lb 13 oz  Weight (kg) 53.071 kg 56.609 kg 54.8 kg      Telemetry    NSR, AFib - Personally Reviewed  ECG    N/a - Personally Reviewed  Physical Exam   GEN: No acute distress.   Neck: No JVD Cardiac: RRR  Respiratory: decreased BS in R base, no rales GI: Soft, nontender, non-distended  MS: No edema; No deformity. Neuro:  Nonfocal  Psych: Normal affect   Labs     High Sensitivity Troponin:   Recent Labs  Lab 09/18/21 2108 09/19/21 0001  TROPONINIHS 93* 115*     Chemistry Recent Labs  Lab 09/18/21 2108 09/19/21 1858 09/20/21 0319 09/21/21 0220  NA 132*  --  130* 128*  K 4.6  --  4.1 4.2  CL 99  --  98 96*  CO2 24  --  23 25  GLUCOSE 112*  --  113* 103*  BUN 18  --  18 21  CREATININE 1.06  --  1.05 1.03  CALCIUM 9.3  --  8.5* 8.5*  MG  --   --  1.9  --   PROT  --  5.6* 5.4*  --   ALBUMIN  --  3.1* 3.0*  --   AST  --  12* 11*  --   ALT  --  9 7  --   ALKPHOS  --  48 51  --   BILITOT  --  0.8 0.9  --   GFRNONAA >60  --  >60 >60  ANIONGAP 9  --  9 7    Lipids No results for input(s): CHOL, TRIG, HDL, LABVLDL, LDLCALC,  CHOLHDL in the last 168 hours.  Hematology Recent Labs  Lab 09/18/21 2108 09/20/21 0319  WBC 7.0  --   RBC 3.41* 3.03*  HGB 11.9*  --   HCT 35.5*  --   MCV 104.1*  --   MCH 34.9*  --   MCHC 33.5  --   RDW 14.7  --   PLT 120*  --    Thyroid  Recent Labs  Lab 09/20/21 0319 09/21/21 0220  TSH 8.620*  --   FREET4  --  1.02    BNP Recent Labs  Lab 09/19/21 0001  BNP 1,425.5*    DDimer No results for input(s): DDIMER in the last 168 hours.   Radiology    DG Chest 1 View  Result Date: 09/20/2021 CLINICAL DATA:  Thoracentesis post op EXAM: CHEST  1 VIEW COMPARISON:  09/18/2021 FINDINGS: No pneumothorax. Skin fold overlies the left lateral hemithorax. Residual small right pleural effusion. Patchy atelectasis/infiltrate at the right lung base. Heart size and mediastinal contours are within normal limits. Aortic Atherosclerosis (ICD10-170.0). Bilateral shoulder DJD IMPRESSION: No pneumothorax post thoracentesis. Electronically Signed   By: Lucrezia Europe M.D.   On: 09/20/2021 11:21   US Abdomen Complete  Result Date: 09/20/2021 CLINICAL DATA:  87 year old male history of abdominal pain. EXAM: ABDOMEN ULTRASOUND COMPLETE COMPARISON:  CT abdomen pelvis from 09/08/2018 FINDINGS: Gallbladder: No gallstones  or wall thickening visualized. No sonographic Murphy sign noted by sonographer. Common bile duct: Diameter: 5 mm Liver: Ill-defined hyperechoic subcapsular mass visualized in the right dome of the liver compatible with previously visualized angioma. No new suspicious hepatic lesions. Within normal limits in parenchymal echogenicity. Portal vein is patent on color Doppler imaging with normal direction of blood flow towards the liver. IVC: No abnormality visualized. Pancreas: Not well visualized. Spleen: Size and appearance within normal limits. Right Kidney: Length: 9.8 cm. Echogenicity within normal limits. No mass or hydronephrosis visualized. Left Kidney: Length: 9.3 cm. Echogenicity within normal limits. No mass or hydronephrosis visualized. Abdominal aorta: Not well visualized. Other findings: None. IMPRESSION: No acute abnormality in the visualized abdomen. Ruthann Cancer, MD [facility] Electronically Signed   By: Ruthann Cancer M.D.   On: 09/20/2021 12:55   ECHOCARDIOGRAM COMPLETE  Result Date: 09/20/2021    ECHOCARDIOGRAM REPORT   Patient Name:   VUE PAVON Date of Exam: 09/20/2021 Medical Rec #:  [id number]         Height:       67.0 in Accession #:    [id number]        Weight:       124.8 lb Date of Birth:  06/30/1923         BSA:          1.655 m Patient Age:    87 years          BP:           139/76 mmHg Patient Gender: M                 HR:           64 bpm. Exam Location:  Inpatient Procedure: 2D Echo, 3D Echo, Color Doppler, Cardiac Doppler and Intracardiac            Opacification Agent Indications:    I50.1 Left ventricular failure; I50.40* Unspecified combined                 systolic (congestive) and diastolic (congestive) heart  failure  History:        Patient has prior history of Echocardiogram examinations, most                 recent 03/18/2016. CAD, Abnormal ECG, Arrythmias:LBBB; Risk                 Factors:Hypertension and  Dyslipidemia. ETOH.  Sonographer:    Roseanna Rainbow RDCS Referring Phys: Theola Sequin IMPRESSIONS  1. Left ventricular ejection fraction, by estimation, is 20 to 25%. The left ventricle has severely decreased function. The left ventricle demonstrates regional wall motion abnormalities (see scoring diagram/findings for description). Left ventricular diastolic parameters are indeterminate. Elevated left ventricular end-diastolic pressure.  2. Right ventricular systolic function is normal. The right ventricular size is normal. There is normal pulmonary artery systolic pressure.  3. The mitral valve is grossly normal. Mild to moderate mitral valve regurgitation. No evidence of mitral stenosis.  4. The aortic valve was not well visualized. There is moderate calcification of the aortic valve. Aortic valve regurgitation is trivial. Mild to moderate aortic valve sclerosis/calcification is present, without any evidence of aortic stenosis.  5. The inferior vena cava is normal in size with greater than 50% respiratory variability, suggesting right atrial pressure of 3 mmHg. Comparison(s): Changes from prior study are noted. Conclusion(s)/Recommendation(s): Severely reduced LVEF with akinesis mid to apical, anterior/septal more than inferior/lateral. Findings discussed with cardiology team seeing him in consultation. FINDINGS  Left Ventricle: Lv thrombus excluded by contrast. Left ventricular ejection fraction, by estimation, is 20 to 25%. The left ventricle has severely decreased function. The left ventricle demonstrates regional wall motion abnormalities. Definity contrast agent was given IV to delineate the left ventricular endocardial borders. The left ventricular internal cavity size was normal in size. There is no left ventricular hypertrophy. Abnormal (paradoxical) septal motion, consistent with left bundle branch block. Left ventricular diastolic parameters are indeterminate. Elevated left ventricular end-diastolic  pressure.  LV Wall Scoring: The mid and distal anterior wall, mid and distal anterior septum, entire apex, and mid inferoseptal segment are akinetic. The antero-lateral wall, inferior wall, posterior wall, basal anteroseptal segment, basal anterior segment, and basal inferoseptal segment are hypokinetic. Right Ventricle: The right ventricular size is normal. No increase in right ventricular wall thickness. Right ventricular systolic function is normal. There is normal pulmonary artery systolic pressure. The tricuspid regurgitant velocity is 2.59 m/s, and  with an assumed right atrial pressure of 3 mmHg, the estimated right ventricular systolic pressure is 35.3 mmHg. Left Atrium: Left atrial size was normal in size. Right Atrium: Right atrial size was normal in size. Pericardium: There is no evidence of pericardial effusion. Mitral Valve: The mitral valve is grossly normal. There is mild calcification of the mitral valve leaflet(s). Mild mitral annular calcification. Mild to moderate mitral valve regurgitation. No evidence of mitral valve stenosis. MV peak gradient, 5.8 mmHg. The mean mitral valve gradient is 3.0 mmHg. Tricuspid Valve: The tricuspid valve is normal in structure. Tricuspid valve regurgitation is trivial. No evidence of tricuspid stenosis. Aortic Valve: The aortic valve was not well visualized. There is moderate calcification of the aortic valve. Aortic valve regurgitation is trivial. Aortic regurgitation PHT measures 793 msec. Mild to moderate aortic valve sclerosis/calcification is present, without any evidence of aortic stenosis. Pulmonic Valve: The pulmonic valve was not well visualized. Pulmonic valve regurgitation is trivial. No evidence of pulmonic stenosis. Aorta: The aortic root, ascending aorta and aortic arch are all structurally normal, with no evidence of dilitation or  obstruction. Venous: The inferior vena cava is normal in size with greater than 50% respiratory variability, suggesting  right atrial pressure of 3 mmHg. IAS/Shunts: The atrial septum is grossly normal.  LEFT VENTRICLE PLAX 2D LVIDd:         4.60 cm     Diastology LVIDs:         4.10 cm     LV e' medial:    9.30 cm/s LV PW:         0.80 cm     LV E/e' medial:  10.9 LV IVS:        1.00 cm     LV e' lateral:   6.45 cm/s LVOT diam:     1.95 cm     LV E/e' lateral: 15.7 LV SV:         55 LV SV Index:   33 LVOT Area:     2.99 cm                             3D Volume EF: LV Volumes (MOD)           3D EF:        22 % LV vol d, MOD A2C: 96.1 ml LV EDV:       155 ml LV vol d, MOD A4C: 91.6 ml LV ESV:       120 ml LV vol s, MOD A2C: 74.8 ml LV SV:        34 ml LV vol s, MOD A4C: 77.8 ml LV SV MOD A2C:     21.3 ml LV SV MOD A4C:     91.6 ml LV SV MOD BP:      21.4 ml RIGHT VENTRICLE            IVC RV S prime:     8.78 cm/s  IVC diam: 1.20 cm TAPSE (M-mode): 1.6 cm LEFT ATRIUM           Index        RIGHT ATRIUM           Index LA diam:      3.10 cm 1.87 cm/m   RA Area:     12.40 cm LA Vol (A2C): 26.7 ml 16.13 ml/m  RA Volume:   28.30 ml  17.10 ml/m LA Vol (A4C): 29.5 ml 17.83 ml/m  AORTIC VALVE LVOT Vmax:   99.30 cm/s LVOT Vmean:  62.800 cm/s LVOT VTI:    0.185 m AI PHT:      793 msec  AORTA Ao Root diam: 3.70 cm Ao Asc diam:  2.90 cm MITRAL VALVE                TRICUSPID VALVE MV Area (PHT): 5.38 cm     TR Peak grad:   26.8 mmHg MV Area VTI:   2.24 cm     TR Vmax:        259.00 cm/s MV Peak grad:  5.8 mmHg MV Mean grad:  3.0 mmHg     SHUNTS MV Vmax:       1.20 m/s     Systemic VTI:  0.18 m MV Vmean:      80.8 cm/s    Systemic Diam: 1.95 cm MV Decel Time: 141 msec MV E velocity: 101.00 cm/s Buford Dresser MD Electronically signed by Buford Dresser MD Signature Date/Time: 09/20/2021/11:47:11 AM    Final    US THORACENTESIS ASP PLEURAL  SPACE W/IMG GUIDE  Result Date: 09/20/2021 INDICATION: Congestive heart failure with fluid overload and bilateral pleural effusions. Request for diagnostic and therapeutic thoracentesis.  EXAM: ULTRASOUND GUIDED LEFT THORACENTESIS MEDICATIONS: 1% lidocaine 10 mL COMPLICATIONS: None immediate. PROCEDURE: An ultrasound guided thoracentesis was thoroughly discussed with the patient and questions answered. The benefits, risks, alternatives and complications were also discussed. The patient understands and wishes to proceed with the procedure. Written consent was obtained. Ultrasound was performed to localize and Dragan Tamburrino an adequate pocket of fluid in the left chest. The area was then prepped and draped in the normal sterile fashion. 1% Lidocaine was used for local anesthesia. Under ultrasound guidance a 6 Fr Safe-T-Centesis catheter was introduced. Thoracentesis was performed. The catheter was removed and a dressing applied. FINDINGS: A total of approximately 800 mL of clear yellow fluid was removed. Samples were sent to the laboratory as requested by the clinical team. IMPRESSION: Successful ultrasound guided left thoracentesis yielding 800 mL of pleural fluid. No pneumothorax on post-procedure chest x-ray. Read by: Gareth Eagle, PA-C Electronically Signed   By: Ruthann Cancer M.D.   On: 09/20/2021 11:02    Cardiac Studies   Echocardiogram 09/20/21 EF 20-25 (akinesis mid to apical, anterior/septal more than inferior/lateral), normal RVSF, mild to mod MR, trivial AI, mild to mod AV sclerosis w/o AS  Patient Profile     87 y.o. male  with a hx of diffuse CAD (medical management), hypertension, anemia, asthma/COPD, GERD, EtOH use and hypothyroidism who is being seen 09/20/2021 for the evaluation of elevated troponin/CHF.  He has large bilateral effusions and has undergone thoracentesis on the L 09/20/21 (-800 mL).  He has also had atrial fibrillation w RVR identified on telemetry.  Echocardiogram demonstrates EF 20-25, which is new.    Assessment & Plan    (HFrEF) heart failure with reduced ejection fraction  EF now 20-25 with WMA.  Suspect ischemic CM.  Given his advanced age, he is not a good  candidate for invasive testing.  The pt does mention that he would be agreeable if we decided to pursue cardiac catheterization.  For now, we will plan on medical Rx.  He is currently on metoprolol succinate and spironolactone.  SCr and K+ are normal today.  Na is a little low.  His BP is somewhat low.  If BP remains stable, consider adding ACE/ARB (he was on olmesartan at home).  I do not think his BP will tolerate Entresto.  His eGFR is good.  We can consider SGLT2i at some point as well.  Continue current dose of Furosemide.    Bilateral Pleural Effusions S/p L tap yesterday.  He apparently has R sided tap planned for today.   Paroxysmal atrial fibrillation He has had some other episodes of AFib on his monitor.  He will need Apixaban started after his R sided thoracentesis.    Coronary artery disease  Diffuse coronary artery disease on cardiac catheterization in 2010.  He had mLAD 70-80 heavily calcified lesion that was not amenable to PCI at that time.  Given his low EF and WMA, he likely has 3 v/surgical disease now. Given his advanced age, plan medical management.  His ASA can be stopped once he is on Apixaban.         For questions or updates, please contact [place] Please consult [email or web address] for contact info under        Signed, Richardson Dopp, PA-C  09/21/2021, 9:34 AM   Personally seen and examined. Agree with above.  Supposedly to have right-sided thoracentesis planned for today.  Eliquis is currently on hold.  Had a brief episode of atrial fibrillation on monitor.  Restart Eliquis 2.5 mg twice a day postthoracentesis.  Back in 2010 had a severe mid LAD lesion that was heavily calcified.  Once again calcific lesion seen on CT scan.  Given his low ejection fraction, EF of 20 to 25% we will continue to manage aggressively medically.  He is now on low-dose metoprolol XL, spironolactone 12.5, could consider Farxiga 5 mg.  Blood pressure might not be able to tolerate  Entresto.  Acute combined systolic and diastolic heart failure (HCC) Treating with medications.  Continue with low-dose IV diuresis.  Candee Furbish, MD

## 2021-09-22 ENCOUNTER — Inpatient Hospital Stay (HOSPITAL_COMMUNITY): Payer: Medicare Other

## 2021-09-22 ENCOUNTER — Encounter (HOSPITAL_COMMUNITY): Payer: Self-pay | Admitting: Physician Assistant

## 2021-09-22 ENCOUNTER — Other Ambulatory Visit (HOSPITAL_COMMUNITY): Payer: Self-pay

## 2021-09-22 DIAGNOSIS — I5041 Acute combined systolic (congestive) and diastolic (congestive) heart failure: Secondary | ICD-10-CM | POA: Diagnosis not present

## 2021-09-22 DIAGNOSIS — I10 Essential (primary) hypertension: Secondary | ICD-10-CM | POA: Diagnosis not present

## 2021-09-22 DIAGNOSIS — I480 Paroxysmal atrial fibrillation: Secondary | ICD-10-CM | POA: Diagnosis not present

## 2021-09-22 HISTORY — PX: IR THORACENTESIS ASP PLEURAL SPACE W/IMG GUIDE: IMG5380

## 2021-09-22 LAB — CBC
HCT: 33 % — ABNORMAL LOW (ref 39.0–52.0)
Hemoglobin: 11.2 g/dL — ABNORMAL LOW (ref 13.0–17.0)
MCH: 35.3 pg — ABNORMAL HIGH (ref 26.0–34.0)
MCHC: 33.9 g/dL (ref 30.0–36.0)
MCV: 104.1 fL — ABNORMAL HIGH (ref 80.0–100.0)
Platelets: 117 10*3/uL — ABNORMAL LOW (ref 150–400)
RBC: 3.17 MIL/uL — ABNORMAL LOW (ref 4.22–5.81)
RDW: 14.3 % (ref 11.5–15.5)
WBC: 5.8 10*3/uL (ref 4.0–10.5)
nRBC: 0 % (ref 0.0–0.2)

## 2021-09-22 LAB — APTT: aPTT: 38 seconds — ABNORMAL HIGH (ref 24–36)

## 2021-09-22 LAB — BASIC METABOLIC PANEL
Anion gap: 7 (ref 5–15)
BUN: 25 mg/dL — ABNORMAL HIGH (ref 8–23)
CO2: 28 mmol/L (ref 22–32)
Calcium: 8.7 mg/dL — ABNORMAL LOW (ref 8.9–10.3)
Chloride: 93 mmol/L — ABNORMAL LOW (ref 98–111)
Creatinine, Ser: 1.19 mg/dL (ref 0.61–1.24)
GFR, Estimated: 56 mL/min — ABNORMAL LOW (ref >60)
Glucose, Bld: 113 mg/dL — ABNORMAL HIGH (ref 70–99)
Potassium: 4.2 mmol/L (ref 3.5–5.1)
Sodium: 128 mmol/L — ABNORMAL LOW (ref 135–145)

## 2021-09-22 LAB — HEPARIN LEVEL (UNFRACTIONATED): Heparin Unfractionated: <0.1 IU/mL — ABNORMAL LOW (ref 0.30–0.70)

## 2021-09-22 MED ORDER — FUROSEMIDE 20 MG PO TABS
20.0000 mg | ORAL_TABLET | Freq: Every day | ORAL | Status: DC
  Administered 2021-09-22 – 2021-09-23 (×2): 20 mg via ORAL
  Filled 2021-09-22 (×2): qty 1

## 2021-09-22 MED ORDER — LISINOPRIL 5 MG PO TABS
2.5000 mg | ORAL_TABLET | Freq: Every day | ORAL | Status: DC
  Administered 2021-09-22: 2.5 mg via ORAL
  Filled 2021-09-22 (×2): qty 1

## 2021-09-22 MED ORDER — LIDOCAINE HCL (PF) 1 % IJ SOLN
INTRAMUSCULAR | Status: DC | PRN
  Administered 2021-09-22: 10 mL

## 2021-09-22 MED ORDER — HEPARIN (PORCINE) 25000 UT/250ML-% IV SOLN: 700.0000 [IU]/h | INTRAVENOUS | Status: DC

## 2021-09-22 MED ORDER — APIXABAN 2.5 MG PO TABS: 2.5000 mg | ORAL_TABLET | Freq: Two times a day (BID) | ORAL | Status: DC

## 2021-09-22 MED ORDER — LIDOCAINE HCL 1 % IJ SOLN
INTRAMUSCULAR | Status: AC
  Filled 2021-09-22: qty 20

## 2021-09-22 MED ORDER — APIXABAN 2.5 MG PO TABS
2.5000 mg | ORAL_TABLET | Freq: Two times a day (BID) | ORAL | Status: DC
  Administered 2021-09-22 – 2021-09-26 (×8): 2.5 mg via ORAL
  Filled 2021-09-22 (×8): qty 1

## 2021-09-22 NOTE — Progress Notes (Signed)
Physical Therapy Treatment Patient Details Name: Albert Allen MRN: [id number] DOB: Jun 29, 1923 Today's Date: 09/22/2021   History of Present Illness Patient is a 87 y/o male who presents on 09/19/21 due to SOB. CT angio of the chest- large bilateral effusions with minimal atelectasis. s/p left thoracentesis 10/22 and plan for Rt thoracentesis 10/23. Also with CHF exacerbation. PMH includes anemia, alcoholic polyneuropathy, COPD, HTN, LBBB.    PT Comments    Pt and son in room at beginning of session, RN discussing thoracentesis in the next couple of hours. Pt agreeable to therapy while he waits however requests not to go out in hallway. Pt is mod I with bed mobility, min guard for transfers and min guard for ambulation without AD in room. Pt with continued low BP but continues to endorse being asymptomatic.  D/c plans remain appropriate at this time. PT will continue to follow acutely.    Recommendations for follow up therapy are one component of a multi-disciplinary discharge planning process, led by the attending physician.  Recommendations may be updated based on patient status, additional functional criteria and insurance authorization.  Follow Up Recommendations  Home health PT     Assistance Recommended at Discharge Set up Supervision/Assistance  Equipment Recommendations  None recommended by PT       Precautions / Restrictions Precautions Precautions: Fall;Other (comment) Precaution Comments: watch BP Restrictions Weight Bearing Restrictions: No     Mobility  Bed Mobility Overal bed mobility: Modified Independent             General bed mobility comments: HOB elevated, use of rail.    Transfers Overall transfer level: Needs assistance Equipment used: None Transfers: Sit to/from Stand Sit to Stand: Min guard           General transfer comment: Min guard for safety, wide BoS.No dizziness.    Ambulation/Gait Ambulation/Gait assistance: Min guard Gait  Distance (Feet): 80 Feet Assistive device: None Gait Pattern/deviations: Step-through pattern;Decreased stride length;Wide base of support Gait velocity: WFL Gait velocity interpretation: <1.8 ft/sec, indicate of risk for recurrent falls General Gait Details: Pt requests not to ambulate in hallway as he is awaiting thoracentisis, however able to walk 80 ft going to the door and back, mild instability with turns, but no overt LoB          Balance Overall balance assessment: Needs assistance Sitting-balance support: Feet supported;No upper extremity supported Sitting balance-Leahy Scale: Good     Standing balance support: During functional activity Standing balance-Leahy Scale: Fair                              Cognition Arousal/Alertness: Awake/alert Behavior During Therapy: WFL for tasks assessed/performed Overall Cognitive Status: Within Functional Limits for tasks assessed                                          Exercises General Exercises - Lower Extremity Quad Sets: AROM;Both;10 reps;Supine Long Arc Quad: AROM;Both;10 reps;Seated Hip ABduction/ADduction: AROM;Both;10 reps;Seated Hip Flexion/Marching: AROM;Both;Seated Toe Raises: AROM;Both;10 reps;Seated Heel Raises: AROM;Both;10 reps;Seated    General Comments General comments (skin integrity, edema, etc.): able to maintain SaO2 >90%O2 with activity, BP continues to be low but pt with no complaints of dizziness, supine 110/61, seated 104/56, standing 112/57, after ambulation 115/48, HR 74-85 during session      Pertinent Vitals/Pain Pain  Assessment: No/denies pain     PT Goals (current goals can now be found in the care plan section) Acute Rehab PT Goals Patient Stated Goal: get better and go home PT Goal Formulation: With patient Time For Goal Achievement: 10/05/21 Potential to Achieve Goals: Good Progress towards PT goals: Progressing toward goals    Frequency    Min  3X/week      PT Plan Current plan remains appropriate       AM-PAC PT "6 Clicks" Mobility   Outcome Measure  Help needed turning from your back to your side while in a flat bed without using bedrails?: None Help needed moving from lying on your back to sitting on the side of a flat bed without using bedrails?: A Little Help needed moving to and from a bed to a chair (including a wheelchair)?: A Little Help needed standing up from a chair using your arms (e.g., wheelchair or bedside chair)?: A Little Help needed to walk in hospital room?: A Little Help needed climbing 3-5 steps with a railing? : A Little 6 Click Score: 19    End of Session Equipment Utilized During Treatment: Gait belt Activity Tolerance: Patient tolerated treatment well Patient left: in bed;with call bell/phone within reach;with bed alarm set Nurse Communication: Mobility status PT Visit Diagnosis: Unsteadiness on feet (R26.81);Difficulty in walking, not elsewhere classified (R26.2);Muscle weakness (generalized) (M62.81)     Time: 1010-1035 PT Time Calculation (min) (ACUTE ONLY): 25 min  Charges:  $Gait Training: 8-22 mins $Therapeutic Exercise: 8-22 mins                     Marimar Suber B. Migdalia Dk PT, DPT Acute Rehabilitation Services Pager [phone] Office [phone]    [place] 09/22/2021, 10:47 AM

## 2021-09-22 NOTE — Progress Notes (Signed)
Heart Failure Navigator Progress Note  Assessed for Heart & Vascular TOC clinic readiness.  Patient does not meet criteria due to pt declined as he would like to see Dr. Julianne Handler.   Navigator available for reassessment of patient.   Pricilla Holm, MSN, RN Heart Failure Nurse Navigator [phone]

## 2021-09-22 NOTE — Progress Notes (Signed)
Occupational Therapy Treatment Patient Details Name: Albert Allen MRN: [id number] DOB: 13-Mar-1923 Today's Date: 09/22/2021   History of present illness Patient is a 87 y/o male who presents on 09/19/21 due to SOB. CT angio of the chest- large bilateral effusions with minimal atelectasis. s/p left thoracentesis 10/22 and plan for Rt thoracentesis 10/23. Also with CHF exacerbation. PMH includes anemia, alcoholic polyneuropathy, COPD, HTN, LBBB.   OT comments  Pt is progressing towards OT goals. Pt remains limited by balance and weakness. During session, pt completed grooming tasks while standing at sink with supervision. Pt also completed LB dressing at EOB with Min guard. D/c plan remains appropriate. Will continue to follow acutely.   Recommendations for follow up therapy are one component of a multi-disciplinary discharge planning process, led by the attending physician.  Recommendations may be updated based on patient status, additional functional criteria and insurance authorization.    Follow Up Recommendations  No OT follow up    Assistance Recommended at Discharge    Equipment Recommendations  None recommended by OT    Recommendations for Other Services      Precautions / Restrictions Precautions Precautions: Fall;Other (comment) Precaution Comments: watch BP Restrictions Weight Bearing Restrictions: No       Mobility Bed Mobility Overal bed mobility: Modified Independent             General bed mobility comments: HOB elevated, use of rail.    Transfers Overall transfer level: Needs assistance Equipment used: None Transfers: Sit to/from Stand Sit to Stand: Min guard                Balance Overall balance assessment: Needs assistance Sitting-balance support: Feet supported;No upper extremity supported Sitting balance-Leahy Scale: Good     Standing balance support: During functional activity Standing balance-Leahy Scale: Fair                              ADL either performed or assessed with clinical judgement   ADL Overall ADL's : Needs assistance/impaired     Grooming: Oral care;Brushing hair;Wash/dry hands;Supervision/safety;Standing Grooming Details (indicate cue type and reason): at sink, intermittent support from UEs on sink during activity             Lower Body Dressing: Min guard;Sit to/from stand               Functional mobility during ADLs: Min guard General ADL Comments: Pt limited by weakness and balance     Vision       Perception     Praxis      Cognition Arousal/Alertness: Awake/alert Behavior During Therapy: WFL for tasks assessed/performed Overall Cognitive Status: Within Functional Limits for tasks assessed                                            Exercises    Shoulder Instructions       General Comments BP 106/50 supine, 110/52 after standing activity    Pertinent Vitals/ Pain       Pain Assessment: 0-10  Home Living                                          Prior Functioning/Environment  Frequency  Min 2X/week        Progress Toward Goals  OT Goals(current goals can now be found in the care plan section)  Progress towards OT goals: Progressing toward goals  Acute Rehab OT Goals OT Goal Formulation: With patient Time For Goal Achievement: 10/05/21 Potential to Achieve Goals: Fair ADL Goals Pt Will Perform Grooming: Independently;standing Pt Will Transfer to Toilet: Independently;ambulating Pt Will Perform Tub/Shower Transfer: with modified independence;shower seat;ambulating Pt/caregiver will Perform Home Exercise Program: Increased strength;Both right and left upper extremity;With written HEP provided Additional ADL Goal #1: pt will tolerate at least 5 minutes of OOB functional activity to improve activity tolerance for ADLs  Plan      Co-evaluation                 AM-PAC OT "6  Clicks" Daily Activity     Outcome Measure   Help from another person eating meals?: None Help from another person taking care of personal grooming?: A Little Help from another person toileting, which includes using toliet, bedpan, or urinal?: A Little Help from another person bathing (including washing, rinsing, drying)?: A Little Help from another person to put on and taking off regular upper body clothing?: None Help from another person to put on and taking off regular lower body clothing?: A Little 6 Click Score: 20    End of Session Equipment Utilized During Treatment: Gait belt  OT Visit Diagnosis: Other abnormalities of gait and mobility (R26.89);Muscle weakness (generalized) (M62.81)   Activity Tolerance Patient tolerated treatment well   Patient Left in bed;with call bell/phone within reach   Nurse Communication Mobility status        Time: 3662-9476 OT Time Calculation (min): 20 min  Charges: OT General Charges $OT Visit: 1 Visit OT Treatments $Self Care/Home Management : 8-22 mins  Charise Leinbach C, OT/L  Acute Rehab [place] 09/22/2021, 11:45 AM

## 2021-09-22 NOTE — Progress Notes (Signed)
PROGRESS NOTE    Albert Allen  ZOX:[id number] DOB: Oct 24, 1923 DOA: 09/18/2021 PCP: [name], MD    Chief Complaint  Patient presents with   Shortness of Breath    Brief Narrative:    Albert Allen is a 87 y.o. male with medical history significant of anemia, alcoholic polyneuropathy, COPD, hypertension, hypothyroidism,LBBB, gerd presents to ED for sob for one week. He denies any chest pain, reports feeling sob and some tightness on ambulation. CT angio of the chest showed large bilateral effusions with minimal atelectasis. No PE.  He was referred to [facility] for admission for CHF exacerbation.   Assessment & Plan:   Active Problems:   Hypothyroidism   Hyperlipidemia   ANEMIA, B12 DEFICIENCY   Alcoholic polyneuropathy (HCC)   Essential hypertension   GERD   Acute combined systolic and diastolic heart failure (HCC)   Acute on chronic diastolic heart failure Gentle diuresis with IV Lasix 20 mg, with strict intake and output and daily weights. Echocardiogram showed LVEF of 20 to 25%, with decreased function, The left ventricle demonstrates regional wall motion abnormalities (see scoring diagram/findings for description). Left ventricular diastolic parameters are indeterminate. Elevated left ventricular end-diastolic pressure. IR consulted for thoracentesis and left pleural fluid aspirated and sent for analysis. So far culturse have been negative.  Possibly transudate effusion.  Patient continues to be on room air with good saturations. Pt currently on spironolactone and low dose metoprolol. Cardiology on board and appreciate recommendations.  Plan for medical management.  Family wanted right sided thoracentesis. Order placed again today.  Transition to oral lasix today.   Elevated troponins probably from demand ischemia from acute CHF.  Episode of atrial flutter/fib yesterday around 5: 17 p.m. Patient was started on IV heparin for anticoagulation transition to  eliquis post thoracentesis.  Patient currently in sinus rhythm.    Hypothyroidism TSH elevated at 8, continue with Synthroid, free t4 levels wnl.    COPD No wheezing on exam today. Continue with duo nebs.  Anxiety Continue with Xanax.   Mild macrocytic anemia Therapy evaluations showing iron deficiency anemia Iron supplementation will be added piror to discharge.   GERD  Stable on protonix.    BPH:  On flomax.   Hyponatremia:  STABLE AROUND 128. Pt asymptomatic. Serum osmo is 271.  SIADH, will need fluid restriction.  Urine sodium is 75 and urien osmo is 263. Am cortisol level adequate.    Mild thrombocytopenia:  No bleeding seen. Continue to monitor.    DVT prophylaxis: heparin.  Code Status: DNR Family Communication: son at bedside.  Disposition:   Status is: Inpatient  Remains inpatient appropriate because: IV lasix, echocardiogram, therapy evals. IV heparin.        Consultants:  CARDIOLOGY IR   Procedures: echocardiogram.  Thoracentesis on the left Successful ultrasound guided left thoracentesis yielding 800 mL of pleural fluid.  Antimicrobials: none.    Subjective: On RA no chest pain or sob. No nausea,vomiting or abd pain.  Objective: Vitals:   09/22/21 0500 09/22/21 0524 09/22/21 0525 09/22/21 0815  BP:   (!) 118/58 122/64  Pulse:   71 71  Resp:    17  Temp:  97.7 F (36.5 C)  (!) 94.4 F (34.7 C)  TempSrc:  Oral  Oral  SpO2:   96% 99%  Weight: 53.3 kg     Height:        Intake/Output Summary (Last 24 hours) at 09/22/2021 1140 Last data filed at 09/22/2021 0542 Gross per 24  hour  Intake 678.12 ml  Output 700 ml  Net -21.88 ml    Filed Weights   09/20/21 0450 09/21/21 0601 09/22/21 0500  Weight: 56.6 kg 53.1 kg 53.3 kg    Examination:  General exam: Appears calm and comfortable  Respiratory system: Clear to auscultation. Respiratory effort normal. Cardiovascular system: S1 & S2 heard, RRR. No JVD,  No pedal  edema. Gastrointestinal system: Abdomen is nondistended, soft and nontender.  Normal bowel sounds heard. Central nervous system: Alert and oriented. No focal neurological deficits. Extremities: Symmetric 5 x 5 power. Skin: No rashes, lesions or ulcers Psychiatry: Mood & affect appropriate.       Data Reviewed: I have personally reviewed following labs and imaging studies  CBC: Recent Labs  Lab 09/18/21 2108 09/22/21 0215  WBC 7.0 5.8  HGB 11.9* 11.2*  HCT 35.5* 33.0*  MCV 104.1* 104.1*  PLT 120* 117*     Basic Metabolic Panel: Recent Labs  Lab 09/18/21 2108 09/20/21 0319 09/21/21 0220 09/22/21 0215  NA 132* 130* 128* 128*  K 4.6 4.1 4.2 4.2  CL 99 98 96* 93*  CO2 24 23 25 28   GLUCOSE 112* 113* 103* 113*  BUN 18 18 21  25*  CREATININE 1.06 1.05 1.03 1.19  CALCIUM 9.3 8.5* 8.5* 8.7*  MG  --  1.9  --   --      GFR: Estimated Creatinine Clearance: 27.4 mL/min (by C-G formula based on SCr of 1.19 mg/dL).  Liver Function Tests: Recent Labs  Lab 09/19/21 1858 09/20/21 0319  AST 12* 11*  ALT 9 7  ALKPHOS 48 51  BILITOT 0.8 0.9  PROT 5.6* 5.4*  ALBUMIN 3.1* 3.0*     CBG: No results for input(s): GLUCAP in the last 168 hours.   Recent Results (from the past 240 hour(s))  Resp Panel by RT-PCR (Flu A&B, Covid) Nasopharyngeal Swab     Status: None   Collection Time: 09/19/21  1:48 AM   Specimen: Nasopharyngeal Swab; Nasopharyngeal(NP) swabs in vial transport medium  Result Value Ref Range Status   SARS Coronavirus 2 by RT PCR NEGATIVE NEGATIVE Final    Comment: (NOTE) SARS-CoV-2 target nucleic acids are NOT DETECTED.  The SARS-CoV-2 RNA is generally detectable in upper respiratory specimens during the acute phase of infection. The lowest concentration of SARS-CoV-2 viral copies this assay can detect is 138 copies/mL. A negative result does not preclude SARS-Cov-2 infection and should not be used as the sole basis for treatment or other patient  management decisions. A negative result may occur with  improper specimen collection/handling, submission of specimen other than nasopharyngeal swab, presence of viral mutation(s) within the areas targeted by this assay, and inadequate number of viral copies(<138 copies/mL). A negative result must be combined with clinical observations, patient history, and epidemiological information. The expected result is Negative.  Fact Sheet for Patients:  EntrepreneurPulse.com.au  Fact Sheet for Healthcare Providers:  IncredibleEmployment.be  This test is no t yet approved or cleared by the [place] FDA and  has been authorized for detection and/or diagnosis of SARS-CoV-2 by FDA under an Emergency Use Authorization (EUA). This EUA will remain  in effect (meaning this test can be used) for the duration of the COVID-19 declaration under Section 564(b)(1) of the Act, 21 U.S.C.section 360bbb-3(b)(1), unless the authorization is terminated  or revoked sooner.       Influenza A by PCR NEGATIVE NEGATIVE Final   Influenza B by PCR NEGATIVE NEGATIVE Final    Comment: (  NOTE) The Xpert Xpress SARS-CoV-2/FLU/RSV plus assay is intended as an aid in the diagnosis of influenza from Nasopharyngeal swab specimens and should not be used as a sole basis for treatment. Nasal washings and aspirates are unacceptable for Xpert Xpress SARS-CoV-2/FLU/RSV testing.  Fact Sheet for Patients: EntrepreneurPulse.com.au  Fact Sheet for Healthcare Providers: IncredibleEmployment.be  This test is not yet approved or cleared by the [place] FDA and has been authorized for detection and/or diagnosis of SARS-CoV-2 by FDA under an Emergency Use Authorization (EUA). This EUA will remain in effect (meaning this test can be used) for the duration of the COVID-19 declaration under Section 564(b)(1) of the Act, 21 U.S.C. section 360bbb-3(b)(1),  unless the authorization is terminated or revoked.  Performed at [facility], [address] [place], [place] [address]   Culture, body fluid w Gram Stain-bottle     Status: None (Preliminary result)   Collection Time: 09/20/21 11:46 AM   Specimen: Pleura  Result Value Ref Range Status   Specimen Description PLEURAL FLUID  Final   Special Requests LUNG LEFT LOWER LOBE  Final   Culture   Final    NO GROWTH 2 DAYS Performed at [facility], [place] [address] [place], [place] [address]    Report Status PENDING  Incomplete  Gram stain     Status: None   Collection Time: 09/20/21 11:46 AM   Specimen: Pleura  Result Value Ref Range Status   Specimen Description PLEURAL FLUID  Final   Special Requests LUNG LEFT LOWER LOBE  Final   Gram Stain   Final    WBC PRESENT,BOTH PMN AND MONONUCLEAR NO ORGANISMS SEEN CYTOSPIN SMEAR Performed at [facility], [address] [place], [place] [address]    Report Status 09/20/2021 FINAL  Final          Radiology Studies: US Abdomen Complete  Result Date: 09/20/2021 CLINICAL DATA:  87 year old male history of abdominal pain. EXAM: ABDOMEN ULTRASOUND COMPLETE COMPARISON:  CT abdomen pelvis from 09/08/2018 FINDINGS: Gallbladder: No gallstones or wall thickening visualized. No sonographic Murphy sign noted by sonographer. Common bile duct: Diameter: 5 mm Liver: Ill-defined hyperechoic subcapsular mass visualized in the right dome of the liver compatible with previously visualized angioma. No new suspicious hepatic lesions. Within normal limits in parenchymal echogenicity. Portal vein is patent on color Doppler imaging with normal direction of blood flow towards the liver. IVC: No abnormality visualized. Pancreas: Not well visualized. Spleen: Size and appearance within normal limits. Right Kidney: Length: 9.8 cm. Echogenicity within normal limits. No mass or hydronephrosis visualized. Left Kidney: Length: 9.3 cm.  Echogenicity within normal limits. No mass or hydronephrosis visualized. Abdominal aorta: Not well visualized. Other findings: None. IMPRESSION: No acute abnormality in the visualized abdomen. Ruthann Cancer, MD [facility] Radiology Electronically Signed   By: Ruthann Cancer M.D.   On: 09/20/2021 12:55   US THORACENTESIS ASP PLEURAL SPACE W/IMG GUIDE  Result Date: 09/20/2021 INDICATION: Congestive heart failure with fluid overload and bilateral pleural effusions. Request for diagnostic and therapeutic thoracentesis. EXAM: ULTRASOUND GUIDED LEFT THORACENTESIS MEDICATIONS: 1% lidocaine 10 mL COMPLICATIONS: None immediate. PROCEDURE: An ultrasound guided thoracentesis was thoroughly discussed with the patient and questions answered. The benefits, risks, alternatives and complications were also discussed. The patient understands and wishes to proceed with the procedure. Written consent was obtained. Ultrasound was performed to localize and mark an adequate pocket of fluid in the left chest. The area was then prepped and  draped in the normal sterile fashion. 1% Lidocaine was used for local anesthesia. Under ultrasound guidance a 6 Fr Safe-T-Centesis catheter was introduced. Thoracentesis was performed. The catheter was removed and a dressing applied. FINDINGS: A total of approximately 800 mL of clear yellow fluid was removed. Samples were sent to the laboratory as requested by the clinical team. IMPRESSION: Successful ultrasound guided left thoracentesis yielding 800 mL of pleural fluid. No pneumothorax on post-procedure chest x-ray. Read by: Gareth Eagle, PA-C Electronically Signed   By: Ruthann Cancer M.D.   On: 09/20/2021 11:02        Scheduled Meds:  feeding supplement  237 mL Oral BID BM   furosemide  20 mg Oral Daily   influenza vaccine adjuvanted  0.5 mL Intramuscular Tomorrow-1000   levothyroxine  88 mcg Oral Q0600   linaclotide  145 mcg Oral QAC  breakfast   lisinopril  2.5 mg Oral Daily   metoprolol succinate  25 mg Oral Daily   multivitamin with minerals  1 tablet Oral Daily   pantoprazole  40 mg Oral Daily   sodium chloride flush  3 mL Intravenous Q12H   spironolactone  12.5 mg Oral Daily   tamsulosin  0.4 mg Oral Daily   vitamin B-12  100 mcg Oral Daily   Continuous Infusions:  sodium chloride     heparin       LOS: 3 days       Hosie Poisson, MD [facility]   To contact the attending provider between 7A-7P or the covering provider during after hours 7P-7A, please log into the web site [email or web address] and access using universal [place] password for that web site. If you do not have the password, please call the hospital operator.  09/22/2021, 11:40 AM

## 2021-09-22 NOTE — TOC Initial Note (Addendum)
Transition of Care [facility]) - Initial/Assessment Note    Patient Details  Name: Albert Allen MRN: [id number] Date of Birth: January 01, 1923  [facility]) CM/SW Contact:    Erenest Rasher, RN Phone Number: [phone] 09/22/2021, 6:42 PM  Clinical Narrative:                 HF TOC CM spoke to pt and gave permission to speak to son, Geraldo Docker. Son states pt will be staying with him and his wife for a few days until he feels comfortable going back to his home. Pt was independent and living alone. Pt will need HH RN and PT. Will need HH orders with [facility]. Has no DME currently at home. Pt may need oxygen. Pt may need a ambulating saturation. Pt dropped to 88% on RA while walking with PT.   Son's address [place], [place] [address]  Expected Discharge Plan: [place] Services Barriers to Discharge: Continued Medical Work up   Patient Goals and CMS Choice Patient states their goals for this hospitalization and ongoing recovery are:: remain independent CMS Medicare.gov Compare Post Acute Care list provided to:: Patient Represenative (must comment) (son-[place]) Choice offered to / list presented to : Patient  Expected Discharge Plan and Services Expected Discharge Plan: [place] In-house Referral: Clinical Social Work Discharge Planning Services: CM Consult Post Acute Care Choice: [place] arrangements for the past 2 months: Single Family Home                                      Prior Living Arrangements/Services Living arrangements for the past 2 months: Single Family Home Lives with:: Self Patient language and need for interpreter reviewed:: Yes Do you feel safe going back to the place where you live?: Yes      Need for Family Participation in Patient Care: Yes (Comment) Care giver support system in place?: Yes (comment) Current home services: Home PT, Home RN Criminal Activity/Legal Involvement  Pertinent to Current Situation/Hospitalization: No - Comment as needed  Activities of Daily Living Home Assistive Devices/Equipment: None ADL Screening (condition at time of admission) Patient's cognitive ability adequate to safely complete daily activities?: Yes Is the patient deaf or have difficulty hearing?: Yes Does the patient have difficulty seeing, even when wearing glasses/contacts?: No Does the patient have difficulty concentrating, remembering, or making decisions?: No Patient able to express need for assistance with ADLs?: Yes Does the patient have difficulty dressing or bathing?: No Independently performs ADLs?: Yes (appropriate for developmental age) Does the patient have difficulty walking or climbing stairs?: Yes Weakness of Legs: None Weakness of Arms/Hands: None  Permission Sought/Granted Permission sought to share information with : Case Manager, PCP, Family Supports, Customer service manager Permission granted to share information with : Yes, Verbal Permission Granted  Share Information with NAME: Ryoma Nofziger  Permission granted to share info w AGENCY: Home Health, DME  Permission granted to share info w Relationship: son  Permission granted to share info w Contact Information: [phone]  Emotional Assessment   Attitude/Demeanor/Rapport: Gracious Affect (typically observed): Accepting Orientation: : Oriented to Self, Oriented to Place, Oriented to  Time, Oriented to Situation   Psych Involvement: No (comment)  Admission diagnosis:  Acute on chronic diastolic CHF (congestive heart failure) (HCC) [I50.33] Acute congestive heart failure, unspecified heart failure type [facility]) [I50.9] Patient Active Problem  List   Diagnosis Date Noted   Acute combined systolic and diastolic heart failure ([place]) 09/19/2021   Insomnia 08/04/2016   Midline low back pain without sciatica 04/20/2016   Right inguinal hernia 12/18/2015   Seasonal allergic rhinitis  04/17/2013   BPH (benign prostatic hyperplasia) 04/17/2013   Diverticulosis of large intestine 06/15/2011   Hernia, hiatal 06/15/2011   Diverticulum of duodenum 06/15/2011   PERSONAL HX COLONIC POLYPS 03/25/2010   PSA, INCREASED 03/20/2010   CAD, NATIVE VESSEL 04/04/2009   Hyperlipidemia 03/27/2009   LEFT BUNDLE BRANCH BLOCK 02/11/2009   HEPATOMEGALY 09/25/2008   ANEMIA, B12 DEFICIENCY 69/67/8938   Alcoholic polyneuropathy ([place]) 03/29/2008   Hypothyroidism 05/31/2007   ABUSE, ALCOHOL, IN REMISSION 05/31/2007   Essential hypertension 05/31/2007   Asthma 05/31/2007   GERD 05/31/2007   Osteoarthritis 05/31/2007   PCP:  [name], MD Pharmacy:   [place], [place] [address] Phone: [phone] Fax: [place], [place] [address] Phone: [phone] Fax: [phone]     Social Determinants of Health (SDOH) Interventions    Readmission Risk Interventions No flowsheet data found.

## 2021-09-22 NOTE — Progress Notes (Signed)
Mobility Specialist Progress Note:   09/22/21 1600  Mobility  Activity Refused mobility  $Mobility charge 1 Mobility   Pt refused mobility d/t being "too weak from getting fluid drained." Will f/u as schedule permits.   Nelta Numbers Mobility Specialist  Phone [phone]

## 2021-09-22 NOTE — Progress Notes (Signed)
Progress Note  Patient Name: Albert Allen Date of Encounter: 09/22/2021  [facility] Cardiologist: Lauree Chandler, MD   Subjective   Denies any CP or SOB.   Inpatient Medications    Scheduled Meds:  aspirin EC  81 mg Oral Daily   feeding supplement  237 mL Oral BID BM   furosemide  20 mg Intravenous Daily   influenza vaccine adjuvanted  0.5 mL Intramuscular Tomorrow-1000   levothyroxine  88 mcg Oral Q0600   linaclotide  145 mcg Oral QAC breakfast   metoprolol succinate  25 mg Oral Daily   multivitamin with minerals  1 tablet Oral Daily   pantoprazole  40 mg Oral Daily   sodium chloride flush  3 mL Intravenous Q12H   spironolactone  12.5 mg Oral Daily   tamsulosin  0.4 mg Oral Daily   vitamin B-12  100 mcg Oral Daily   Continuous Infusions:  sodium chloride     heparin 700 Units/hr (09/22/21 0230)   PRN Meds: sodium chloride, acetaminophen, albuterol, ALPRAZolam, ipratropium-albuterol, ondansetron (ZOFRAN) IV, sodium chloride flush   Vital Signs    Vitals:   09/22/21 0500 09/22/21 0524 09/22/21 0525 09/22/21 0815  BP:   (!) 118/58 122/64  Pulse:   71 71  Resp:    17  Temp:  97.7 F (36.5 C)  (!) 94.4 F (34.7 C)  TempSrc:  Oral  Oral  SpO2:   96% 99%  Weight: 53.3 kg     Height:        Intake/Output Summary (Last 24 hours) at 09/22/2021 0833 Last data filed at 09/22/2021 0542 Gross per 24 hour  Intake 678.12 ml  Output 1150 ml  Net -471.88 ml   Last 3 Weights 09/22/2021 09/21/2021 09/20/2021  Weight (lbs) 117 lb 8 oz 117 lb 124 lb 12.8 oz  Weight (kg) 53.298 kg 53.071 kg 56.609 kg      Telemetry    NSR without recurrent afib - Personally Reviewed  ECG    NSR, LBBB, first degree AV block - Personally Reviewed  Physical Exam   GEN: No acute distress.   Neck: No JVD Cardiac: RRR, no murmurs, rubs, or gallops.  Respiratory: Clear to auscultation bilaterally. GI: Soft, nontender, non-distended  MS: No edema; No  deformity. Neuro:  Nonfocal  Psych: Normal affect   Labs    High Sensitivity Troponin:   Recent Labs  Lab 09/18/21 2108 09/19/21 0001  TROPONINIHS 93* 115*     Chemistry Recent Labs  Lab 09/19/21 1858 09/20/21 0319 09/21/21 0220 09/22/21 0215  NA  --  130* 128* 128*  K  --  4.1 4.2 4.2  CL  --  98 96* 93*  CO2  --  23 25 28   GLUCOSE  --  113* 103* 113*  BUN  --  18 21 25*  CREATININE  --  1.05 1.03 1.19  CALCIUM  --  8.5* 8.5* 8.7*  MG  --  1.9  --   --   PROT 5.6* 5.4*  --   --   ALBUMIN 3.1* 3.0*  --   --   AST 12* 11*  --   --   ALT 9 7  --   --   ALKPHOS 48 51  --   --   BILITOT 0.8 0.9  --   --   GFRNONAA  --  >60 >60 56*  ANIONGAP  --  9 7 7     Lipids No results for input(s): CHOL,  TRIG, HDL, LABVLDL, LDLCALC, CHOLHDL in the last 168 hours.  Hematology Recent Labs  Lab 09/18/21 2108 09/20/21 0319 09/22/21 0215  WBC 7.0  --  5.8  RBC 3.41* 3.03* 3.17*  HGB 11.9*  --  11.2*  HCT 35.5*  --  33.0*  MCV 104.1*  --  104.1*  MCH 34.9*  --  35.3*  MCHC 33.5  --  33.9  RDW 14.7  --  14.3  PLT 120*  --  117*   Thyroid  Recent Labs  Lab 09/20/21 0319 09/21/21 0220  TSH 8.620*  --   FREET4  --  1.02    BNP Recent Labs  Lab 09/19/21 0001  BNP 1,425.5*    DDimer No results for input(s): DDIMER in the last 168 hours.   Radiology    DG Chest 1 View  Result Date: 09/20/2021 CLINICAL DATA:  Thoracentesis post op EXAM: CHEST  1 VIEW COMPARISON:  09/18/2021 FINDINGS: No pneumothorax. Skin fold overlies the left lateral hemithorax. Residual small right pleural effusion. Patchy atelectasis/infiltrate at the right lung base. Heart size and mediastinal contours are within normal limits. Aortic Atherosclerosis (ICD10-170.0). Bilateral shoulder DJD IMPRESSION: No pneumothorax post thoracentesis. Electronically Signed   By: Lucrezia Europe M.D.   On: 09/20/2021 11:21   US Abdomen Complete  Result Date: 09/20/2021 CLINICAL DATA:  87 year old male history of  abdominal pain. EXAM: ABDOMEN ULTRASOUND COMPLETE COMPARISON:  CT abdomen pelvis from 09/08/2018 FINDINGS: Gallbladder: No gallstones or wall thickening visualized. No sonographic Murphy sign noted by sonographer. Common bile duct: Diameter: 5 mm Liver: Ill-defined hyperechoic subcapsular mass visualized in the right dome of the liver compatible with previously visualized angioma. No new suspicious hepatic lesions. Within normal limits in parenchymal echogenicity. Portal vein is patent on color Doppler imaging with normal direction of blood flow towards the liver. IVC: No abnormality visualized. Pancreas: Not well visualized. Spleen: Size and appearance within normal limits. Right Kidney: Length: 9.8 cm. Echogenicity within normal limits. No mass or hydronephrosis visualized. Left Kidney: Length: 9.3 cm. Echogenicity within normal limits. No mass or hydronephrosis visualized. Abdominal aorta: Not well visualized. Other findings: None. IMPRESSION: No acute abnormality in the visualized abdomen. Ruthann Cancer, MD [facility] Electronically Signed   By: Ruthann Cancer M.D.   On: 09/20/2021 12:55   ECHOCARDIOGRAM COMPLETE  Result Date: 09/20/2021    ECHOCARDIOGRAM REPORT   Patient Name:   Albert Allen Date of Exam: 09/20/2021 Medical Rec #:  [id number]         Height:       67.0 in Accession #:    [id number]        Weight:       124.8 lb Date of Birth:  [birth year]         BSA:          1.655 m Patient Age:    87 years          BP:           139/76 mmHg Patient Gender: M                 HR:           64 bpm. Exam Location:  Inpatient Procedure: 2D Echo, 3D Echo, Color Doppler, Cardiac Doppler and Intracardiac            Opacification Agent Indications:    I50.1 Left ventricular failure; I50.40* Unspecified combined  systolic (congestive) and diastolic (congestive) heart failure  History:        Patient has prior history of Echocardiogram  examinations, most                 recent 03/18/2016. CAD, Abnormal ECG, Arrythmias:LBBB; Risk                 Factors:Hypertension and Dyslipidemia. ETOH.  Sonographer:    Roseanna Rainbow RDCS Referring Phys: Theola Sequin IMPRESSIONS  1. Left ventricular ejection fraction, by estimation, is 20 to 25%. The left ventricle has severely decreased function. The left ventricle demonstrates regional wall motion abnormalities (see scoring diagram/findings for description). Left ventricular diastolic parameters are indeterminate. Elevated left ventricular end-diastolic pressure.  2. Right ventricular systolic function is normal. The right ventricular size is normal. There is normal pulmonary artery systolic pressure.  3. The mitral valve is grossly normal. Mild to moderate mitral valve regurgitation. No evidence of mitral stenosis.  4. The aortic valve was not well visualized. There is moderate calcification of the aortic valve. Aortic valve regurgitation is trivial. Mild to moderate aortic valve sclerosis/calcification is present, without any evidence of aortic stenosis.  5. The inferior vena cava is normal in size with greater than 50% respiratory variability, suggesting right atrial pressure of 3 mmHg. Comparison(s): Changes from prior study are noted. Conclusion(s)/Recommendation(s): Severely reduced LVEF with akinesis mid to apical, anterior/septal more than inferior/lateral. Findings discussed with cardiology team seeing him in consultation. FINDINGS  Left Ventricle: Lv thrombus excluded by contrast. Left ventricular ejection fraction, by estimation, is 20 to 25%. The left ventricle has severely decreased function. The left ventricle demonstrates regional wall motion abnormalities. Definity contrast agent was given IV to delineate the left ventricular endocardial borders. The left ventricular internal cavity size was normal in size. There is no left ventricular hypertrophy. Abnormal (paradoxical) septal motion,  consistent with left bundle branch block. Left ventricular diastolic parameters are indeterminate. Elevated left ventricular end-diastolic pressure.  LV Wall Scoring: The mid and distal anterior wall, mid and distal anterior septum, entire apex, and mid inferoseptal segment are akinetic. The antero-lateral wall, inferior wall, posterior wall, basal anteroseptal segment, basal anterior segment, and basal inferoseptal segment are hypokinetic. Right Ventricle: The right ventricular size is normal. No increase in right ventricular wall thickness. Right ventricular systolic function is normal. There is normal pulmonary artery systolic pressure. The tricuspid regurgitant velocity is 2.59 m/s, and  with an assumed right atrial pressure of 3 mmHg, the estimated right ventricular systolic pressure is 66.0 mmHg. Left Atrium: Left atrial size was normal in size. Right Atrium: Right atrial size was normal in size. Pericardium: There is no evidence of pericardial effusion. Mitral Valve: The mitral valve is grossly normal. There is mild calcification of the mitral valve leaflet(s). Mild mitral annular calcification. Mild to moderate mitral valve regurgitation. No evidence of mitral valve stenosis. MV peak gradient, 5.8 mmHg. The mean mitral valve gradient is 3.0 mmHg. Tricuspid Valve: The tricuspid valve is normal in structure. Tricuspid valve regurgitation is trivial. No evidence of tricuspid stenosis. Aortic Valve: The aortic valve was not well visualized. There is moderate calcification of the aortic valve. Aortic valve regurgitation is trivial. Aortic regurgitation PHT measures 793 msec. Mild to moderate aortic valve sclerosis/calcification is present, without any evidence of aortic stenosis. Pulmonic Valve: The pulmonic valve was not well visualized. Pulmonic valve regurgitation is trivial. No evidence of pulmonic stenosis. Aorta: The aortic root, ascending aorta and aortic arch are all structurally normal,  with no evidence  of dilitation or obstruction. Venous: The inferior vena cava is normal in size with greater than 50% respiratory variability, suggesting right atrial pressure of 3 mmHg. IAS/Shunts: The atrial septum is grossly normal.  LEFT VENTRICLE PLAX 2D LVIDd:         4.60 cm     Diastology LVIDs:         4.10 cm     LV e' medial:    9.30 cm/s LV PW:         0.80 cm     LV E/e' medial:  10.9 LV IVS:        1.00 cm     LV e' lateral:   6.45 cm/s LVOT diam:     1.95 cm     LV E/e' lateral: 15.7 LV SV:         55 LV SV Index:   33 LVOT Area:     2.99 cm                             3D Volume EF: LV Volumes (MOD)           3D EF:        22 % LV vol d, MOD A2C: 96.1 ml LV EDV:       155 ml LV vol d, MOD A4C: 91.6 ml LV ESV:       120 ml LV vol s, MOD A2C: 74.8 ml LV SV:        34 ml LV vol s, MOD A4C: 77.8 ml LV SV MOD A2C:     21.3 ml LV SV MOD A4C:     91.6 ml LV SV MOD BP:      21.4 ml RIGHT VENTRICLE            IVC RV S prime:     8.78 cm/s  IVC diam: 1.20 cm TAPSE (M-mode): 1.6 cm LEFT ATRIUM           Index        RIGHT ATRIUM           Index LA diam:      3.10 cm 1.87 cm/m   RA Area:     12.40 cm LA Vol (A2C): 26.7 ml 16.13 ml/m  RA Volume:   28.30 ml  17.10 ml/m LA Vol (A4C): 29.5 ml 17.83 ml/m  AORTIC VALVE LVOT Vmax:   99.30 cm/s LVOT Vmean:  62.800 cm/s LVOT VTI:    0.185 m AI PHT:      793 msec  AORTA Ao Root diam: 3.70 cm Ao Asc diam:  2.90 cm MITRAL VALVE                TRICUSPID VALVE MV Area (PHT): 5.38 cm     TR Peak grad:   26.8 mmHg MV Area VTI:   2.24 cm     TR Vmax:        259.00 cm/s MV Peak grad:  5.8 mmHg MV Mean grad:  3.0 mmHg     SHUNTS MV Vmax:       1.20 m/s     Systemic VTI:  0.18 m MV Vmean:      80.8 cm/s    Systemic Diam: 1.95 cm MV Decel Time: 141 msec MV E velocity: 101.00 cm/s Buford Dresser MD Electronically signed by Buford Dresser MD Signature Date/Time: 09/20/2021/11:47:11 AM    Final  US THORACENTESIS ASP PLEURAL SPACE W/IMG GUIDE  Result Date:  09/20/2021 INDICATION: Congestive heart failure with fluid overload and bilateral pleural effusions. Request for diagnostic and therapeutic thoracentesis. EXAM: ULTRASOUND GUIDED LEFT THORACENTESIS MEDICATIONS: 1% lidocaine 10 mL COMPLICATIONS: None immediate. PROCEDURE: An ultrasound guided thoracentesis was thoroughly discussed with the patient and questions answered. The benefits, risks, alternatives and complications were also discussed. The patient understands and wishes to proceed with the procedure. Written consent was obtained. Ultrasound was performed to localize and mark an adequate pocket of fluid in the left chest. The area was then prepped and draped in the normal sterile fashion. 1% Lidocaine was used for local anesthesia. Under ultrasound guidance a 6 Fr Safe-T-Centesis catheter was introduced. Thoracentesis was performed. The catheter was removed and a dressing applied. FINDINGS: A total of approximately 800 mL of clear yellow fluid was removed. Samples were sent to the laboratory as requested by the clinical team. IMPRESSION: Successful ultrasound guided left thoracentesis yielding 800 mL of pleural fluid. No pneumothorax on post-procedure chest x-ray. Read by: Gareth Eagle, PA-C Electronically Signed   By: Ruthann Cancer M.D.   On: 09/20/2021 11:02    Cardiac Studies   Echo 09/20/2021  1. Left ventricular ejection fraction, by estimation, is 20 to 25%. The  left ventricle has severely decreased function. The left ventricle  demonstrates regional wall motion abnormalities (see scoring  diagram/findings for description). Left ventricular  diastolic parameters are indeterminate. Elevated left ventricular  end-diastolic pressure.   2. Right ventricular systolic function is normal. The right ventricular  size is normal. There is normal pulmonary artery systolic pressure.   3. The mitral valve is grossly normal. Mild to moderate mitral valve  regurgitation. No evidence of mitral stenosis.    4. The aortic valve was not well visualized. There is moderate  calcification of the aortic valve. Aortic valve regurgitation is trivial.  Mild to moderate aortic valve sclerosis/calcification is present, without  any evidence of aortic stenosis.   5. The inferior vena cava is normal in size with greater than 50%  respiratory variability, suggesting right atrial pressure of 3 mmHg.   Comparison(s): Changes from prior study are noted.   Conclusion(s)/Recommendation(s): Severely reduced LVEF with akinesis mid  to apical, anterior/septal more than inferior/lateral. Findings discussed  with cardiology team seeing him in consultation.   Patient Profile     87 y.o. male with PMH of CAD, HTN, anemia, COPD, GERD, EtOH and hypothyroidism presented with acute CHF, hospital course complicated by brief PAF  Assessment & Plan    Acute combined systolic and diastolic heart failure  - EF 20-25% with wall motion abnormality  - felt likely ischemic cardiomyopathy.   - given his advanced age, planning for medical management. (Although previous note mentioned he would be agreeable if we decided to pursue cardiac catheterization  - continue metoprolol succinate and spironolactone. BP unlikely to tolerate addition of entresto, SBP high 90s last night. Consider add ARB for now, losartan 25mg  daily. Previously note mentioned consider farxiga, however patient is quite concerned with cost of medication and wish to have as many generic as possible. Will need to check the cost of Eliquis on follow up.   - euvolemic on exam. Consider convert IV lasix to 20mg  daily PO lasix. Likely can be discharged soon.   CAD: h/o known mid LAD 70-80% heavily calcified lesion in 2010 that was not amenable to PCI at the time. Given drop in EF, likely have 3v disease at this point. Currently  on ASA, will need to restart Eliquis prior to discharge.   Bilateral pleural effusion: s/p left thoracentesis on 10/22 with removal of 800 ml.  He is under the impression that he needs R thoracentesis, I do not see one scheduled. Although CT of chest mentioned large bilateral pleural effison, CXR obtained after L thoracentesis showed mild R residual pleural effusion. R lung actually passes air well on today's physical exam.  Newly diagnosed PAF: Eliquis 2.5mg  given 1 dose before held in anticipation of thoracentesis. On IV heparin for now. Consider restart Eliquis 2.5mg  BID. Will defer to MD to see if ASA will need to be held once Eliquis restarts. Currently in NSR      For questions or updates, please contact [place] Please consult [email or web address] for contact info under        Signed, Almyra Deforest, [place]  09/22/2021, 8:33 AM

## 2021-09-22 NOTE — Progress Notes (Signed)
[place] for heparin>>apixaban Indication: atrial fibrillation  Allergies  Allergen Reactions   Amoxicillin Swelling and Other (See Comments)    Pt has swelling with PCN's   Penicillins Swelling    Lips swelling Has patient had a PCN reaction causing immediate rash, facial/tongue/throat swelling, SOB or lightheadedness with hypotension: No Has patient had a PCN reaction causing severe rash involving mucus membranes or skin necrosis: No Has patient had a PCN reaction that required hospitalization No Has patient had a PCN reaction occurring within the last 10 years: No If all of the above answers are "NO", then may proceed with Cephalosporin use.   Ceclor [Cefaclor] Other (See Comments)    Pt does not remember reaction   Celebrex [Celecoxib] Other (See Comments)    Pt does not remember reaction   Levaquin [Levofloxacin In D5w] Other (See Comments)    Pt does not remember reaction    Patient Measurements: Height: _0  (170.2 cm) Weight: 53.3 kg (117 lb 8 oz) IBW/kg (Calculated) : 66.1 Heparin Dosing Weight: 53kg  Vital Signs: Temp: 97.7 F (36.5 C) (10/24 1100) Temp Source: Oral (10/24 1100) BP: 107/51 (10/24 1209) Pulse Rate: 73 (10/24 1100)  Labs: Recent Labs    09/20/21 0319 09/21/21 0220 09/22/21 0215  HGB  --   --  11.2*  HCT  --   --  33.0*  PLT  --   --  117*  APTT  --   --  38*  HEPARINUNFRC  --   --  <0.10*  CREATININE 1.05 1.03 1.19     Estimated Creatinine Clearance: 27.4 mL/min (by C-G formula based on SCr of 1.19 mg/dL).   Medical History: Past Medical History:  Diagnosis Date   ABNORMAL CV (STRESS) TEST 03/05/2009   ABUSE, ALCOHOL, IN REMISSION 05/31/2007   Adhesive capsulitis of shoulder 2/50/5397   Alcoholic polyneuropathy ([place]) 03/29/2008   ANEMIA, B12 DEFICIENCY 03/29/2008   Anxiety state, unspecified 11/19/2008   ASTHMA 05/31/2007   Asthma    CAD, NATIVE VESSEL 04/04/2009   CARPAL TUNNEL SYNDROME, RIGHT  09/05/2007   Cataract    removed bilaterally   Chronic prostatitis 03/29/2008   COPD (chronic obstructive pulmonary disease) ([place])    "they told me I have a touch of COPD"   Crohn's disease ([place])    Degenerative disc disease, cervical    DEGENERATIVE DISC DISEASE, CERVICAL SPINE 06/22/2007   Diverticulosis    DYSPHAGIA UNSPECIFIED 09/25/2008   Esophageal stricture    External hemorrhoids    GERD 05/31/2007   GOUT 05/31/2007   Hepatomegaly 09/25/2008   Hiatal hernia    HIP PAIN 03/20/2010   History of echocardiogram    Echo 4/17 - mild LVH, EF 50-55%, Gr 1 DD, trivial AI, MAC, mild MR   HYPERLIPIDEMIA 03/27/2009   HYPERTENSION 05/31/2007   HYPOTHYROIDISM 05/31/2007   LEFT BUNDLE BRANCH BLOCK 02/11/2009   Liver hemangioma    Liver mass, left lobe    LOC OSTEOARTHROS NOT SPEC PRIM/SEC LOWER LEG 03/20/2010   OSTEOARTHRITIS 05/31/2007   PERSONAL HX COLONIC POLYPS 03/05/1997   adenomatous    PSA, INCREASED 03/20/2010   SINUSITIS, CHRONIC NOS 05/31/2007   Sore throat    CURRENTLY   SYNCOPE 11/19/2008   WEIGHT LOSS, RECENT 11/19/2008    Assessment: 38 yoM admitted with CHF and need for thoracentesis. Pt is on apixaban PTA for hx AFib, to transition to heparin.   Heparin turned off this am for thoracentesis. Orders to start apixaban  post procedure.   Goal of Therapy:  Heparin level 0.3-0.7 units/ml aPTT 66-102 seconds Monitor platelets by anticoagulation protocol: Yes   Plan:  Will follow up resuming apixaban 2.5 bid post thoracentesis.   Erin Hearing PharmD., BCPS Clinical Pharmacist 09/22/2021 12:55 PM

## 2021-09-22 NOTE — Procedures (Signed)
PROCEDURE SUMMARY:  Successful US guided right thoracentesis. Yielded 545ml of amber fluid. Pt tolerated procedure well. No immediate complications.  Specimen not sent for labs. CXR ordered.  EBL < 5 mL  Jesiah Grismer PA-C 09/22/2021 4:56 PM

## 2021-09-22 NOTE — TOC Benefit Eligibility Note (Signed)
Test claim ran for this patient. Eliquis #60/30-day is 126.08. Pt is in the coverage gap (donut hole)

## 2021-09-23 ENCOUNTER — Ambulatory Visit: Payer: Medicare Other | Admitting: Family Medicine

## 2021-09-23 DIAGNOSIS — G621 Alcoholic polyneuropathy: Secondary | ICD-10-CM | POA: Diagnosis not present

## 2021-09-23 DIAGNOSIS — I5041 Acute combined systolic (congestive) and diastolic (congestive) heart failure: Secondary | ICD-10-CM | POA: Diagnosis not present

## 2021-09-23 DIAGNOSIS — I509 Heart failure, unspecified: Secondary | ICD-10-CM | POA: Diagnosis not present

## 2021-09-23 LAB — BASIC METABOLIC PANEL
Anion gap: 7 (ref 5–15)
BUN: 27 mg/dL — ABNORMAL HIGH (ref 8–23)
CO2: 26 mmol/L (ref 22–32)
Calcium: 8.3 mg/dL — ABNORMAL LOW (ref 8.9–10.3)
Chloride: 91 mmol/L — ABNORMAL LOW (ref 98–111)
Creatinine, Ser: 1.26 mg/dL — ABNORMAL HIGH (ref 0.61–1.24)
GFR, Estimated: 52 mL/min — ABNORMAL LOW (ref >60)
Glucose, Bld: 104 mg/dL — ABNORMAL HIGH (ref 70–99)
Potassium: 4.3 mmol/L (ref 3.5–5.1)
Sodium: 124 mmol/L — ABNORMAL LOW (ref 135–145)

## 2021-09-23 LAB — CBC
HCT: 30.5 % — ABNORMAL LOW (ref 39.0–52.0)
Hemoglobin: 10.3 g/dL — ABNORMAL LOW (ref 13.0–17.0)
MCH: 35.2 pg — ABNORMAL HIGH (ref 26.0–34.0)
MCHC: 33.8 g/dL (ref 30.0–36.0)
MCV: 104.1 fL — ABNORMAL HIGH (ref 80.0–100.0)
Platelets: 109 10*3/uL — ABNORMAL LOW (ref 150–400)
RBC: 2.93 MIL/uL — ABNORMAL LOW (ref 4.22–5.81)
RDW: 14.4 % (ref 11.5–15.5)
WBC: 5.7 10*3/uL (ref 4.0–10.5)
nRBC: 0 % (ref 0.0–0.2)

## 2021-09-23 LAB — CYTOLOGY - NON PAP

## 2021-09-23 LAB — T3, FREE: T3, Free: 1.5 pg/mL — ABNORMAL LOW (ref 2.0–4.4)

## 2021-09-23 MED ORDER — LACTATED RINGERS IV BOLUS: 500.0000 mL | Freq: Once | INTRAVENOUS | Status: DC

## 2021-09-23 MED ORDER — SODIUM CHLORIDE 0.9 % IV BOLUS
500.0000 mL | Freq: Once | INTRAVENOUS | Status: AC
  Administered 2021-09-23: 500 mL via INTRAVENOUS

## 2021-09-23 MED ORDER — LEVOTHYROXINE SODIUM 100 MCG PO TABS
100.0000 ug | ORAL_TABLET | Freq: Every day | ORAL | Status: DC
  Administered 2021-09-24 – 2021-09-26 (×3): 100 ug via ORAL
  Filled 2021-09-23 (×3): qty 1

## 2021-09-23 MED ORDER — FUROSEMIDE 20 MG PO TABS
20.0000 mg | ORAL_TABLET | Freq: Every day | ORAL | Status: DC
  Administered 2021-09-24 – 2021-09-25 (×2): 20 mg via ORAL
  Filled 2021-09-23 (×2): qty 1

## 2021-09-23 NOTE — Progress Notes (Signed)
PROGRESS NOTE    Albert Allen  STM:[id number] DOB: [birth year] DOA: 09/18/2021 PCP: [name], MD    Chief Complaint  Patient presents with   Shortness of Breath    Brief Narrative:    Albert Allen is a 87 y.o. male with medical history significant of anemia, alcoholic polyneuropathy, COPD, hypertension, hypothyroidism,LBBB, gerd presents to ED for sob for one week. He denies any chest pain, reports feeling sob and some tightness on ambulation. CT angio of the chest showed large bilateral effusions with minimal atelectasis. No PE.  He was referred to [facility] for admission for CHF exacerbation.  He was started on IV lasix and cardiology consulted. Echocardiogram showed wall motion abn and LVEF of 20 to 25%.  Urine output couldn't be measured appropriately. Course complicated by hyponatremia and hypotension. We held lasix, lisinopril, metoprolol and spironolactone as his BP dropped to 70/30's this afternoon. Dr Debara Pickett was  notified, who will see the patient in am and review cardiac meds prior to discharge.   Assessment & Plan:   Active Problems:   Hypothyroidism   Hyperlipidemia   ANEMIA, B12 DEFICIENCY   Alcoholic polyneuropathy (HCC)   Essential hypertension   GERD   Acute combined systolic and diastolic heart failure (HCC)   Acute on chronic diastolic heart failure Initially started with gentle diuresis with IV Lasix 20 mg daily and transitioned to oral lasix yesterday, but held today due to hypotension.  Continue with strict intake and output and daily weights. Echocardiogram showed LVEF of 20 to 25%, with decreased function, The left ventricle demonstrates regional wall motion abnormalities (see scoring diagram/findings for description). Left ventricular diastolic parameters are indeterminate. Elevated left ventricular end-diastolic pressure. IR consulted for thoracentesis, he underwent left and right thoracentesis.  So far culturse have been negative.  Possibly  transudate effusion.  Patient continues to be on room air with good saturations. Cardiology on board and appreciate recommendations.      Elevated troponins probably from demand ischemia from acute CHF.  Episode of atrial flutter/fib on admission around 5: 17 p.m. Patient was started on IV heparin for anticoagulation transition to eliquis post thoracentesis.  Patient currently in sinus rhythm.    Hypothyroidism TSH elevated at 8 ( Euthyroid sick syndrome) , continue with Synthroid, free t4 levels wnl.    COPD No wheezing on exam today. Continue with duo nebs.  Anxiety Continue with Xanax.   Mild macrocytic anemia Therapy evaluations showing iron deficiency anemia Iron supplementation will be added piror to discharge.   GERD  Stable on protonix.    BPH:  On flomax.   Hyponatremia:  Continues to worsen Unclear etiology. Tsh slightly elevated , t3 is low. Free t4 is wnl.  Serum osmo Urine sodium is 75 and urine osmo is 263. Am cortisol level adequate.  Nephrology will be consulted for further recommendations.    Mild thrombocytopenia:  No bleeding seen. Continue to monitor.   Mild AKI Possibly from overdiuresis.    DVT prophylaxis: heparin.  Code Status: DNR Family Communication: son at bedside.  Disposition:   Status is: Inpatient  Remains inpatient appropriate because: IV lasix, echocardiogram, therapy evals. IV heparin.        Consultants:  CARDIOLOGY IR   Procedures: echocardiogram.  Thoracentesis on the left Successful ultrasound guided left thoracentesis yielding 800 mL of pleural fluid.  Antimicrobials: none.    Subjective: On RA no chest pain or sob. No nausea,vomiting or abd pain.  Objective: Vitals:   09/23/21 0953 09/23/21  1039 09/23/21 1148 09/23/21 1321  BP: (!) 85/46 (!) 92/45 (!) 79/42 (!) 104/49  Pulse:  69 72 75  Resp:    16  Temp:    97.6 F (36.4 C)  TempSrc:    Oral  SpO2:    97%  Weight:      Height:         Intake/Output Summary (Last 24 hours) at 09/23/2021 1324 Last data filed at 09/22/2021 1635 Gross per 24 hour  Intake --  Output 200 ml  Net -200 ml    Filed Weights   09/21/21 0601 09/22/21 0500 09/23/21 0545  Weight: 53.1 kg 53.3 kg 53.3 kg    Examination:  General exam: elderly gentleman, ill appearing, not in distress.  Respiratory system: Clear to auscultation. Respiratory effort normal. Cardiovascular system: S1 & S2 heard, RRR. No pedal edema. Gastrointestinal system: Abdomen is nondistended, soft and nontender.  Normal bowel sounds heard. Central nervous system: Alert and oriented to place and person, grossly non focal.  Extremities: Symmetric 5 x 5 power. Skin: No rashes, lesions or ulcers Psychiatry: Mood & affect appropriate.        Data Reviewed: I have personally reviewed following labs and imaging studies  CBC: Recent Labs  Lab 09/18/21 2108 09/22/21 0215 09/23/21 0253  WBC 7.0 5.8 5.7  HGB 11.9* 11.2* 10.3*  HCT 35.5* 33.0* 30.5*  MCV 104.1* 104.1* 104.1*  PLT 120* 117* 109*     Basic Metabolic Panel: Recent Labs  Lab 09/18/21 2108 09/20/21 0319 09/21/21 0220 09/22/21 0215 09/23/21 0253  NA 132* 130* 128* 128* 124*  K 4.6 4.1 4.2 4.2 4.3  CL 99 98 96* 93* 91*  CO2 24 23 25 28 26   GLUCOSE 112* 113* 103* 113* 104*  BUN 18 18 21  25* 27*  CREATININE 1.06 1.05 1.03 1.19 1.26*  CALCIUM 9.3 8.5* 8.5* 8.7* 8.3*  MG  --  1.9  --   --   --      GFR: Estimated Creatinine Clearance: 25.9 mL/min (A) (by C-G formula based on SCr of 1.26 mg/dL (H)).  Liver Function Tests: Recent Labs  Lab 09/19/21 1858 09/20/21 0319  AST 12* 11*  ALT 9 7  ALKPHOS 48 51  BILITOT 0.8 0.9  PROT 5.6* 5.4*  ALBUMIN 3.1* 3.0*     CBG: No results for input(s): GLUCAP in the last 168 hours.   Recent Results (from the past 240 hour(s))  Resp Panel by RT-PCR (Flu A&B, Covid) Nasopharyngeal Swab     Status: None   Collection Time: 09/19/21  1:48 AM    Specimen: Nasopharyngeal Swab; Nasopharyngeal(NP) swabs in vial transport medium  Result Value Ref Range Status   SARS Coronavirus 2 by RT PCR NEGATIVE NEGATIVE Final    Comment: (NOTE) SARS-CoV-2 target nucleic acids are NOT DETECTED.  The SARS-CoV-2 RNA is generally detectable in upper respiratory specimens during the acute phase of infection. The lowest concentration of SARS-CoV-2 viral copies this assay can detect is 138 copies/mL. A negative result does not preclude SARS-Cov-2 infection and should not be used as the sole basis for treatment or other patient management decisions. A negative result may occur with  improper specimen collection/handling, submission of specimen other than nasopharyngeal swab, presence of viral mutation(s) within the areas targeted by this assay, and inadequate number of viral copies(<138 copies/mL). A negative result must be combined with clinical observations, patient history, and epidemiological information. The expected result is Negative.  Fact Sheet for Patients:  EntrepreneurPulse.com.au  Fact Sheet for Healthcare Providers:  IncredibleEmployment.be  This test is no t yet approved or cleared by the [place] FDA and  has been authorized for detection and/or diagnosis of SARS-CoV-2 by FDA under an Emergency Use Authorization (EUA). This EUA will remain  in effect (meaning this test can be used) for the duration of the COVID-19 declaration under Section 564(b)(1) of the Act, 21 U.S.C.section 360bbb-3(b)(1), unless the authorization is terminated  or revoked sooner.       Influenza A by PCR NEGATIVE NEGATIVE Final   Influenza B by PCR NEGATIVE NEGATIVE Final    Comment: (NOTE) The Xpert Xpress SARS-CoV-2/FLU/RSV plus assay is intended as an aid in the diagnosis of influenza from Nasopharyngeal swab specimens and should not be used as a sole basis for treatment. Nasal washings and aspirates are  unacceptable for Xpert Xpress SARS-CoV-2/FLU/RSV testing.  Fact Sheet for Patients: EntrepreneurPulse.com.au  Fact Sheet for Healthcare Providers: IncredibleEmployment.be  This test is not yet approved or cleared by the [place] FDA and has been authorized for detection and/or diagnosis of SARS-CoV-2 by FDA under an Emergency Use Authorization (EUA). This EUA will remain in effect (meaning this test can be used) for the duration of the COVID-19 declaration under Section 564(b)(1) of the Act, 21 U.S.C. section 360bbb-3(b)(1), unless the authorization is terminated or revoked.  Performed at [facility], [address], [place], [place] [address]   Culture, body fluid w Gram Stain-bottle     Status: None (Preliminary result)   Collection Time: 09/20/21 11:46 AM   Specimen: Pleura  Result Value Ref Range Status   Specimen Description PLEURAL FLUID  Final   Special Requests LUNG LEFT LOWER LOBE  Final   Culture   Final    NO GROWTH 3 DAYS Performed at [facility], [address] [place], [place] [address]    Report Status PENDING  Incomplete  Gram stain     Status: None   Collection Time: 09/20/21 11:46 AM   Specimen: Pleura  Result Value Ref Range Status   Specimen Description PLEURAL FLUID  Final   Special Requests LUNG LEFT LOWER LOBE  Final   Gram Stain   Final    WBC PRESENT,BOTH PMN AND MONONUCLEAR NO ORGANISMS SEEN CYTOSPIN SMEAR Performed at [facility], [address], [place], [place] [address]    Report Status 09/20/2021 FINAL  Final          Radiology Studies: DG Chest 1 View  Result Date: 09/22/2021 CLINICAL DATA:  Status post right thoracentesis. EXAM: CHEST  1 VIEW COMPARISON:  09/20/2021 FINDINGS: Stable cardiomediastinal contours. Aortic atherosclerotic calcifications. Decreased volume of right pleural effusion status post thoracentesis. No pneumothorax visualized. Small left  pleural effusion is identified. No airspace opacities or signs of interstitial edema. Degenerative changes noted within the right glenohumeral joint. IMPRESSION: 1. Decreased volume of right pleural effusion status post thoracentesis. 2. No pneumothorax. Electronically Signed   By: Kerby Moors M.D.   On: 09/22/2021 15:05   IR THORACENTESIS ASP PLEURAL SPACE W/IMG GUIDE  Result Date: 09/22/2021 Pasty Spillers, [place]     09/22/2021  4:56 PM PROCEDURE SUMMARY: Successful US guided right thoracentesis. Yielded 52ml of amber fluid. Pt tolerated procedure well. No immediate complications. Specimen not sent for labs. CXR ordered. EBL < 5 mL Hayley Boisseau PA-C 09/22/2021 4:56 PM        Scheduled Meds:  apixaban  2.5 mg Oral BID   feeding supplement  237  mL Oral BID BM   furosemide  20 mg Oral Daily   influenza vaccine adjuvanted  0.5 mL Intramuscular Tomorrow-1000   [START ON 09/24/2021] levothyroxine  100 mcg Oral Q0600   linaclotide  145 mcg Oral QAC breakfast   multivitamin with minerals  1 tablet Oral Daily   pantoprazole  40 mg Oral Daily   tamsulosin  0.4 mg Oral Daily   vitamin B-12  100 mcg Oral Daily   Continuous Infusions:     LOS: 4 days       Hosie Poisson, MD [facility]   To contact the attending provider between 7A-7P or the covering provider during after hours 7P-7A, please log into the web site [email or web address] and access using universal [place] password for that web site. If you do not have the password, please call the hospital operator.  09/23/2021, 1:24 PM

## 2021-09-23 NOTE — Progress Notes (Signed)
Mobility Specialist Progress Note:   09/23/21 1440  Therapy Vitals  Pulse Rate 79  Mobility  Activity Ambulated in room  Level of Assistance Independent  Assistive Device None  Distance Ambulated (ft) 140 ft  Mobility Ambulated independently in room  Mobility Response Tolerated well  Mobility performed by Mobility specialist  $Mobility charge 1 Mobility   Stayed in room, per pt request. Pt c/o pain in spine, and generalized weakness.   Pre Mobility: HR 79 bpm Post Mobility: HR 72 bpm  Nelta Numbers Mobility Specialist  Phone [phone]

## 2021-09-23 NOTE — Discharge Instructions (Addendum)

## 2021-09-23 NOTE — Care Management Important Message (Signed)
Important Message  Patient Details  Name: Albert Allen MRN: [id number] Date of Birth: 09/14/1923   Medicare Important Message Given:  Yes     Orbie Pyo 09/23/2021, 4:16 PM

## 2021-09-23 NOTE — Consult Note (Signed)
Reason for Consult:  hyponatremia Referring Physician: [name] is an 87 y.o. male with past medical history significant for hypertension, COPD, hypothyroidism, hiatal hernia and GERD as well as coronary artery disease per cath in 2010-medical management.  He presented to the emergency department on 10/21 with complaints of shortness of breath.  Imaging showed bilateral pleural effusions-he has now undergone bilateral thoracenteses with improvement in symptoms, now on room air.  I am consulted for hyponatremia.  In looking through past values he has had sodiums in the low 130s before.  He does not recall being told this had been an issue.  Presenting sodium this hospitalization was 132, it is decreased to 124 today and that is the reason for consult.  Also of note, his blood pressures been low and he is noted to be on lisinopril, metoprolol, and Aldactone.  He has received some Lasix intermittently during hospitalization but also fluid boluses.  Even though he has pleural effusions does not seem to have peripheral edema, albumin is 3.0.  A.m. cortisol is within normal limits, serum Osmo 271, urine osm of 263, TSH slightly high at 8.6.  He has had an updated echo which now reveals an EF of 20 to 25%   Trend in Creatinine: Creat  Date/Time Value Ref Range Status  05/01/2013 10:59 AM 0.92 0.50 - 1.35 mg/dL Final   Creatinine, Ser  Date/Time Value Ref Range Status  09/23/2021 02:53 AM 1.26 (H) 0.61 - 1.24 mg/dL Final  09/22/2021 02:15 AM 1.19 0.61 - 1.24 mg/dL Final  09/21/2021 02:20 AM 1.03 0.61 - 1.24 mg/dL Final  09/20/2021 03:19 AM 1.05 0.61 - 1.24 mg/dL Final  09/18/2021 09:08 PM 1.06 0.61 - 1.24 mg/dL Final  01/07/2021 09:19 AM 1.40 (H) 0.76 - 1.27 mg/dL Final  09/18/2020 09:42 AM 1.20 0.76 - 1.27 mg/dL Final  07/26/2019 11:16 AM 0.84 0.76 - 1.27 mg/dL Final  01/25/2019 11:41 AM 0.92 0.76 - 1.27 mg/dL Final  09/07/2018 04:24 PM 1.05 0.76 - 1.27 mg/dL Final  07/26/2018 10:24  AM 1.01 0.76 - 1.27 mg/dL Final  01/17/2018 09:22 AM 0.98 0.76 - 1.27 mg/dL Final  01/05/2017 08:42 AM 1.01 0.76 - 1.27 mg/dL Final  07/21/2016 10:56 AM 0.77 0.76 - 1.27 mg/dL Final  07/01/2016 10:42 AM 0.85 0.61 - 1.24 mg/dL Final  04/20/2016 11:07 AM 0.94 0.76 - 1.27 mg/dL Final  02/06/2016 10:06 PM 1.04 0.61 - 1.24 mg/dL Final  12/19/2015 04:35 AM 0.97 0.61 - 1.24 mg/dL Final  12/18/2015 06:47 AM 0.94 0.61 - 1.24 mg/dL Final  11/29/2015 01:12 PM 1.04 0.61 - 1.24 mg/dL Final  07/29/2015 01:57 PM 0.75 (L) 0.76 - 1.27 mg/dL Final  01/29/2015 12:25 PM 0.83 0.76 - 1.27 mg/dL Final  01/22/2015 09:00 AM 1.94 (H) 0.76 - 1.27 mg/dL Final  06/19/2014 08:00 AM 0.90 0.50 - 1.35 mg/dL Final  06/07/2014 09:15 PM 1.02 0.50 - 1.35 mg/dL Final  06/04/2014 09:27 AM 0.87 0.76 - 1.27 mg/dL Final  11/03/2012 12:42 PM 1.01 0.50 - 1.35 mg/dL Final  09/22/2012 10:51 AM 0.9 0.4 - 1.5 mg/dL Final  01/12/2012 08:23 AM 0.8 0.4 - 1.5 mg/dL Final  01/07/2011 12:30 PM 1.1 0.4 - 1.5 mg/dL Final  09/25/2010 10:11 AM 1.0 0.4 - 1.5 mg/dL Final  12/13/2009 11:37 PM 0.82 0.40 - 1.50 mg/dL Final  02/11/2009 12:00 AM 0.9 0.4 - 1.5 mg/dL Final  09/25/2008 09:15 AM 1.0 0.4 - 1.5 mg/dL Final  07/13/2008 10:30 AM 0.93  Final  03/29/2008 08:48  AM 0.8 0.4 - 1.5 mg/dL Final  12/29/2007 11:23 AM 0.8 0.4 - 1.5 mg/dL Final  06/22/2007 10:06 AM 0.8 0.4 - 1.5 mg/dL Final   Sodium  Date/Time Value Ref Range Status  09/23/2021 02:53 AM 124 (L) 135 - 145 mmol/L Final  09/22/2021 02:15 AM 128 (L) 135 - 145 mmol/L Final  09/21/2021 02:20 AM 128 (L) 135 - 145 mmol/L Final  09/20/2021 03:19 AM 130 (L) 135 - 145 mmol/L Final  09/18/2021 09:08 PM 132 (L) 135 - 145 mmol/L Final  01/07/2021 09:19 AM 136 134 - 144 mmol/L Final  09/18/2020 09:42 AM 137 134 - 144 mmol/L Final  07/26/2019 11:16 AM 137 134 - 144 mmol/L Final  01/25/2019 11:41 AM 139 134 - 144 mmol/L Final  07/26/2018 10:24 AM 137 134 - 144 mmol/L Final  01/17/2018 09:22  AM 137 134 - 144 mmol/L Final  01/05/2017 08:42 AM 139 134 - 144 mmol/L Final  07/21/2016 10:56 AM 131 (L) 134 - 144 mmol/L Final  07/01/2016 10:42 AM 129 (L) 135 - 145 mmol/L Final  04/20/2016 11:07 AM 137 134 - 144 mmol/L Final  02/06/2016 10:06 PM 131 (L) 135 - 145 mmol/L Final  12/19/2015 04:35 AM 135 135 - 145 mmol/L Final  12/18/2015 06:47 AM 136 135 - 145 mmol/L Final  11/29/2015 01:12 PM 135 135 - 145 mmol/L Final  07/29/2015 01:57 PM 138 134 - 144 mmol/L Final  01/29/2015 12:25 PM 135 134 - 144 mmol/L Final  01/22/2015 09:00 AM 133 (L) 134 - 144 mmol/L Final  06/19/2014 08:00 AM 137 137 - 147 mEq/L Final  06/07/2014 09:15 PM 136 (L) 137 - 147 mEq/L Final  06/04/2014 09:27 AM 138 134 - 144 mmol/L Final  05/01/2013 10:59 AM 135 135 - 145 mEq/L Final  11/03/2012 12:42 PM 138 135 - 145 mEq/L Final  09/22/2012 10:51 AM 138 135 - 145 mEq/L Final  01/12/2012 08:23 AM 139 135 - 145 mEq/L Final  01/07/2011 12:30 PM 142 135 - 145 mEq/L Final  09/25/2010 10:11 AM 132 (L) 135 - 145 meq/L Final  12/13/2009 11:37 PM 142 135 - 145 meq/L Final  02/11/2009 12:00 AM 144 135 - 145 meq/L Final  09/25/2008 09:15 AM 140 135 - 145 meq/L Final  07/13/2008 10:30 AM 139  Final  03/29/2008 08:48 AM 139 135 - 145 meq/L Final  12/29/2007 11:23 AM 142 135 - 145 meq/L Final  06/22/2007 10:06 AM 139 135 - 145 meq/L Final   PMH:   Past Medical History:  Diagnosis Date   ABNORMAL CV (STRESS) TEST 03/05/2009   ABUSE, ALCOHOL, IN REMISSION 05/31/2007   Adhesive capsulitis of shoulder 4/62/7035   Alcoholic polyneuropathy ([place]) 03/29/2008   ANEMIA, B12 DEFICIENCY 03/29/2008   Anxiety state, unspecified 11/19/2008   ASTHMA 05/31/2007   Asthma    CAD, NATIVE VESSEL 04/04/2009   CARPAL TUNNEL SYNDROME, RIGHT 09/05/2007   Cataract    removed bilaterally   Chronic prostatitis 03/29/2008   COPD (chronic obstructive pulmonary disease) ([place])    "they told me I have a touch of COPD"   Crohn's disease ([place])     Degenerative disc disease, cervical    DEGENERATIVE DISC DISEASE, CERVICAL SPINE 06/22/2007   Diverticulosis    DYSPHAGIA UNSPECIFIED 09/25/2008   Esophageal stricture    External hemorrhoids    GERD 05/31/2007   GOUT 05/31/2007   Hepatomegaly 09/25/2008   Hiatal hernia    HIP PAIN 03/20/2010   History of echocardiogram  Echo 4/17 - mild LVH, EF 50-55%, Gr 1 DD, trivial AI, MAC, mild MR   HYPERLIPIDEMIA 03/27/2009   HYPERTENSION 05/31/2007   HYPOTHYROIDISM 05/31/2007   LEFT BUNDLE BRANCH BLOCK 02/11/2009   Liver hemangioma    Liver mass, left lobe    LOC OSTEOARTHROS NOT SPEC PRIM/SEC LOWER LEG 03/20/2010   OSTEOARTHRITIS 05/31/2007   PERSONAL HX COLONIC POLYPS 03/05/1997   adenomatous    PSA, INCREASED 03/20/2010   SINUSITIS, CHRONIC NOS 05/31/2007   Sore throat    CURRENTLY   SYNCOPE 11/19/2008   WEIGHT LOSS, RECENT 11/19/2008    PSH:   Past Surgical History:  Procedure Laterality Date   CARDIAC CATHETERIZATION     carpel tunnel release     EYE SURGERY     INGUINAL HERNIA REPAIR  12/18/2015   INGUINAL HERNIA REPAIR Right 12/18/2015   Procedure: OPEN REPAIR RIGHT INGUINAL HERNIA ;  Surgeon: Greer Pickerel, MD;  Location: [place];  Service: General;  Laterality: Right;   INSERTION OF MESH Right 12/18/2015   Procedure: INSERTION OF MESH;  Surgeon: Greer Pickerel, MD;  Location: [place];  Service: General;  Laterality: Right;   IR THORACENTESIS ASP PLEURAL SPACE W/IMG GUIDE  09/22/2021   ORIF ULNAR FRACTURE  11/03/2012   Procedure: OPEN REDUCTION INTERNAL FIXATION (ORIF) ULNAR FRACTURE;  Surgeon: Hessie Dibble, MD;  Location: [place];  Service: Orthopedics;  Laterality: Right;  ORIF radius and ulnar fractures   pul. colapse w/chest tube     TONSILLECTOMY      Allergies:  Allergies  Allergen Reactions   Amoxicillin Swelling and Other (See Comments)    Pt has swelling with PCN's   Penicillins Swelling    Lips swelling Has patient had a PCN reaction causing immediate rash, facial/tongue/throat  swelling, SOB or lightheadedness with hypotension: No Has patient had a PCN reaction causing severe rash involving mucus membranes or skin necrosis: No Has patient had a PCN reaction that required hospitalization No Has patient had a PCN reaction occurring within the last 10 years: No If all of the above answers are "NO", then may proceed with Cephalosporin use.   Ceclor [Cefaclor] Other (See Comments)    Pt does not remember reaction   Celebrex [Celecoxib] Other (See Comments)    Pt does not remember reaction   Levaquin [Levofloxacin In D5w] Other (See Comments)    Pt does not remember reaction    Medications:   Prior to Admission medications   Medication Sig Start Date End Date Taking? Authorizing Provider  albuterol (VENTOLIN HFA) 108 (90 Base) MCG/ACT inhaler INHALE 2 PUFFS EVERY 6 HOURS AS NEEDED FOR WHEEZING OR SHORTNESS OF BREATH Patient taking differently: Inhale 2 puffs into the lungs every 6 (six) hours as needed for wheezing or shortness of breath. 06/16/21  Yes Stacks, Cletus Gash, MD  ALPRAZolam Duanne Moron) 0.5 MG tablet Take 1 tablet (0.5 mg total) by mouth at bedtime. Only on Mondays and Thursdays Patient taking differently: Take 0.5 mg by mouth daily as needed for anxiety. 08/19/21  Yes [name], MD  aspirin EC 81 MG tablet Take 81 mg by mouth daily.   Yes [provider]  Cyanocobalamin (VITAMIN B-12 PO) Take 1 tablet by mouth daily.   Yes [provider]  diclofenac (VOLTAREN) 75 MG EC tablet TAKE 1 TABLET BY MOUTH  TWICE DAILY AS NEEDED FOR  JOINT OR MUSCLE PAIN Patient taking differently: Take 75 mg by mouth daily as needed for mild pain. 05/07/21  Yes Stacks,  Cletus Gash, MD  hydroxypropyl methylcellulose / hypromellose (ISOPTO TEARS / GONIOVISC) 2.5 % ophthalmic solution Place 1 drop into both eyes daily.   Yes [provider]  levothyroxine (SYNTHROID) 88 MCG tablet Take 1 tablet (88 mcg total) by mouth daily before breakfast. On an empty stomach 06/19/21   Yes Stacks, Cletus Gash, MD  magnesium hydroxide (MILK OF MAGNESIA) 400 MG/5ML suspension Take 15 mLs by mouth daily as needed for mild constipation.   Yes [provider]  Multiple Vitamins-Minerals (PRESERVISION AREDS) CAPS Take 1 capsule by mouth daily.   Yes [provider]  pantoprazole (PROTONIX) 40 MG tablet TAKE 1 TABLET BY MOUTH  DAILY Patient taking differently: Take 40 mg by mouth daily. 07/31/21  Yes Pyrtle, Lajuan Lines, MD  silodosin (RAPAFLO) 4 MG CAPS capsule TAKE 1 CAPSULE ONCE A DAY Patient taking differently: Take 4 mg by mouth daily. 07/17/21  Yes [name], MD  Albuterol Sulfate (PROAIR RESPICLICK) 650 (90 Base) MCG/ACT AEPB Inhale 2 puffs into the lungs 4 (four) times daily as needed (shortness of breath). Patient not taking: No sig reported 11/05/20   [name], MD  famotidine (PEPCID) 40 MG tablet Take 1 tablet (40 mg total) by mouth at bedtime. Patient not taking: No sig reported 06/19/21   [name], MD  LINZESS 72 MCG capsule TAKE 1 CAPSULE DAILY BEFORE BREAKFAST Patient not taking: No sig reported 06/18/21   Pyrtle, Lajuan Lines, MD  olmesartan (BENICAR) 20 MG tablet Take 0.5 tablets (10 mg total) by mouth daily. Patient not taking: No sig reported 04/25/21   Loman Brooklyn, FNP    Discontinued Meds:   Medications Discontinued During This Encounter  Medication Reason   ALPRAZolam Duanne Moron) tablet 0.5 mg Duplicate   levothyroxine (SYNTHROID) tablet 88 mcg    ALPRAZolam (XANAX) tablet 0.5 mg    enoxaparin (LOVENOX) injection 30 mg    apixaban (ELIQUIS) tablet 2.5 mg    heparin ADULT infusion 100 units/mL (25000 units/246m)    furosemide (LASIX) injection 20 mg    aspirin EC tablet 81 mg    apixaban (ELIQUIS) tablet 2.5 mg    sodium chloride flush (NS) 0.9 % injection 3 mL    sodium chloride flush (NS) 0.9 % injection 3 mL    0.9 %  sodium chloride infusion    heparin ADULT infusion 100 units/mL (25000 units/2564m    furosemide (LASIX) tablet 20 mg     lactated ringers bolus 500 mL    metoprolol succinate (TOPROL-XL) 24 hr tablet 25 mg    spironolactone (ALDACTONE) tablet 12.5 mg    lisinopril (ZESTRIL) tablet 2.5 mg     Social History:  reports that he quit smoking about 23 years ago. His smoking use included cigarettes. He has never used smokeless tobacco. He reports current alcohol use of about 5.0 standard drinks per week. He reports that he does not use drugs.  Family History:   Family History  Problem Relation Age of Onset   Cancer Mother        In back but unsure of what type of cancer   Alcohol abuse Father    Lung disease Father        abscess   Diabetes Sister    Cancer Brother        throat   Diabetes Brother    Diabetes Sister    Colon cancer Neg Hx    Colon polyps Neg Hx    Esophageal cancer Neg Hx    Rectal cancer  Neg Hx    Stomach cancer Neg Hx     Pertinent items noted in HPI and remainder of comprehensive ROS otherwise negative.  Blood pressure (!) 79/42, pulse 72, temperature (!) 97.4 F (36.3 C), temperature source Oral, resp. rate 16, height _0  (1.702 m), weight 53.3 kg, SpO2 99 %. General appearance: alert, distracted, no distress, and very talkative-  difficult to keep on task Resp: diminished breath sounds bibasilar Cardio: irregularly irregular rhythm GI: soft, non-tender; bowel sounds normal; no masses,  no organomegaly Extremities: extremities normal, atraumatic, no cyanosis or edema  Labs: Basic Metabolic Panel: Recent Labs  Lab 09/18/21 2108 09/19/21 1858 09/20/21 0319 09/21/21 0220 09/22/21 0215 09/23/21 0253  NA 132*  --  130* 128* 128* 124*  K 4.6  --  4.1 4.2 4.2 4.3  CL 99  --  98 96* 93* 91*  CO2 24  --  _1 GLUCOSE 112*  --  113* 103* 113* 104*  BUN 18  --  18 21 25* 27*  CREATININE 1.06  --  1.05 1.03 1.19 1.26*  ALBUMIN  --  3.1* 3.0*  --   --   --   CALCIUM 9.3  --  8.5* 8.5* 8.7* 8.3*   Liver Function Tests: Recent Labs  Lab 09/19/21 1858  09/20/21 0319  AST 12* 11*  ALT 9 7  ALKPHOS 48 51  BILITOT 0.8 0.9  PROT 5.6* 5.4*  ALBUMIN 3.1* 3.0*   No results for input(s): LIPASE, AMYLASE in the last 168 hours. No results for input(s): AMMONIA in the last 168 hours. CBC: Recent Labs  Lab 09/18/21 2108 09/22/21 0215 09/23/21 0253  WBC 7.0 5.8 5.7  HGB 11.9* 11.2* 10.3*  HCT 35.5* 33.0* 30.5*  MCV 104.1* 104.1* 104.1*  PLT 120* 117* 109*   PT/INR: _2 (inr:5) Cardiac Enzymes: No results for input(s): CKTOTAL, CKMB, CKMBINDEX, TROPONINI in the last 168 hours. CBG: No results for input(s): GLUCAP in the last 168 hours.  Iron Studies:  Recent Labs  Lab 09/20/21 0319  IRON 25*  TIBC 354  FERRITIN 19*    Xrays/Other Studies: DG Chest 1 View  Result Date: 09/22/2021 CLINICAL DATA:  Status post right thoracentesis. EXAM: CHEST  1 VIEW COMPARISON:  09/20/2021 FINDINGS: Stable cardiomediastinal contours. Aortic atherosclerotic calcifications. Decreased volume of right pleural effusion status post thoracentesis. No pneumothorax visualized. Small left pleural effusion is identified. No airspace opacities or signs of interstitial edema. Degenerative changes noted within the right glenohumeral joint. IMPRESSION: 1. Decreased volume of right pleural effusion status post thoracentesis. 2. No pneumothorax. Electronically Signed   By: Kerby Moors M.D.   On: 09/22/2021 15:05   IR THORACENTESIS ASP PLEURAL SPACE W/IMG GUIDE  Result Date: 09/22/2021 Albert Allen, [place]     09/22/2021  4:56 PM PROCEDURE SUMMARY: Successful US guided right thoracentesis. Yielded 528m of amber fluid. Pt tolerated procedure well. No immediate complications. Specimen not sent for labs. CXR ordered. EBL < 5 mL Albert Boisseau PA-C 09/22/2021 4:56 PM     Assessment/Plan: 87year old white male with possibly new onset heart failure with decreased ejection fraction and bilateral pleural effusions.  His hospitalization has been complicated by  hypotension and hyponatremia Hyponatremia- I think may be mostly due to this diagnosis of heart failure.  His urine osmolality is appropriately low to the situation, so not really significant SIADH.  He says that his appetite is fairly good so it does not seem like tea and toast syndrome.  His  TSH indicates not adequate control of his hypothyroidism, I have increased his Synthroid dose from 88 to 100.  I am going to start him on oral Lasix in an effort to dilute urine further to correct his sodium but not to lower his BP.  He is currently getting a bolus of normal saline so that we will likely had the sodium in the opposite direction.  He will need to institute fluid restriction, I have ordered Hypotension-so does not seem like a great idea to try to treat him like traditional heart failure with an ACE or ARB, Aldactone, beta-blocker.  It does not seem his blood pressure will tolerate.  I have stopped all of these in the setting of a blood pressure of 70.  I does hope that we can head off AKI.  I did keep him on his Flomax however as he does describe LUTS    Albert Allen 09/23/2021, 12:45 PM

## 2021-09-23 NOTE — Progress Notes (Signed)
   09/23/21 1148  Vitals  BP (!) 79/42  MAP (mmHg) (!) 54  Pulse Rate 72   Patient is sitting up in bed talking with son.  No distress noted.  MD notified of BP.

## 2021-09-23 NOTE — Progress Notes (Signed)
Physical Therapy Treatment Patient Details Name: Albert Allen MRN: [id number] DOB: 1923/02/08 Today's Date: 09/23/2021   History of Present Illness Pt is a 87 y.o. male admitted 09/19/21 with SOB. CTA with large bilateral pleural effusions. Workup also for CHF exacerbation. S/p L thoracentesis 10/22, R thoracentesis 10/24. PMH includes CAD, HTN, anemia, asthma, COPD.   PT Comments    Pt progressing with mobility. Today's session focused on transfers and ambulation for improving activity tolerance and strength; pt moving well with supervision for safety; pt denies SOB or dizziness, negative orthostatic hypotension (see values below). Pt remains limited by generalized weakness and decreased activity tolerance. Continue to recommend HHPT services to maximize functional mobility and independence prior to return home.  Orthostatic BPs Supine 103/55  Sitting 110/66  Standing 113/59  Standing after 2 min 121/57  Post-ambulation 113/61      Recommendations for follow up therapy are one component of a multi-disciplinary discharge planning process, led by the attending physician.  Recommendations may be updated based on patient status, additional functional criteria and insurance authorization.  Follow Up Recommendations  Home health PT     Assistance Recommended at Discharge PRN  Equipment Recommendations  None recommended by PT    Recommendations for Other Services       Precautions / Restrictions Precautions Precautions: Fall Restrictions Weight Bearing Restrictions: No     Mobility  Bed Mobility Overal bed mobility: Modified Independent             General bed mobility comments: HOB elevated, use of rail.    Transfers Overall transfer level: Independent Equipment used: None Transfers: Sit to/from Stand                  Ambulation/Gait Ambulation/Gait assistance: Supervision Gait Distance (Feet): 120 Feet Assistive device: None Gait  Pattern/deviations: Step-through pattern;Decreased stride length;Wide base of support Gait velocity: Decreased   General Gait Details: Slow, mostly steady gait without DME, intermittent brief standing rest break, pt conversant throughout with no significant DOE noted; denies dizziness   Stairs             Wheelchair Mobility    Modified Rankin (Stroke Patients Only)       Balance Overall balance assessment: Needs assistance Sitting-balance support: Feet supported;No upper extremity supported Sitting balance-Leahy Scale: Good     Standing balance support: During functional activity;No upper extremity supported Standing balance-Leahy Scale: Good                              Cognition Arousal/Alertness: Awake/alert Behavior During Therapy: WFL for tasks assessed/performed Overall Cognitive Status: Within Functional Limits for tasks assessed                                          Exercises      General Comments General comments (skin integrity, edema, etc.): negative orthostatic vital signs (resting/supine BP 103/55, up to 121/57 with activity); discussion regarding activity recommendations at home, fall risk reduction/strategies      Pertinent Vitals/Pain Pain Assessment: Faces Faces Pain Scale: Hurts a little bit Pain Location: back Pain Descriptors / Indicators: Sore Pain Intervention(s): Monitored during session    Home Living                          Prior Function  PT Goals (current goals can now be found in the care plan section) Progress towards PT goals: Progressing toward goals    Frequency    Min 3X/week      PT Plan Current plan remains appropriate    Co-evaluation              AM-PAC PT "6 Clicks" Mobility   Outcome Measure  Help needed turning from your back to your side while in a flat bed without using bedrails?: None Help needed moving from lying on your back to sitting  on the side of a flat bed without using bedrails?: None Help needed moving to and from a bed to a chair (including a wheelchair)?: None Help needed standing up from a chair using your arms (e.g., wheelchair or bedside chair)?: None Help needed to walk in hospital room?: A Little Help needed climbing 3-5 steps with a railing? : A Little 6 Click Score: 22    End of Session   Activity Tolerance: Patient tolerated treatment well Patient left: in bed;with call bell/phone within reach;with bed alarm set Nurse Communication: Mobility status PT Visit Diagnosis: Unsteadiness on feet (R26.81);Difficulty in walking, not elsewhere classified (R26.2);Muscle weakness (generalized) (M62.81)     Time: 1740-8144 PT Time Calculation (min) (ACUTE ONLY): 23 min  Charges:  $Therapeutic Exercise: 8-22 mins $Therapeutic Activity: 8-22 mins                     Mabeline Caras, PT, DPT Acute Rehabilitation Services  Pager [phone] Office [place] 09/23/2021, 5:40 PM

## 2021-09-24 DIAGNOSIS — E782 Mixed hyperlipidemia: Secondary | ICD-10-CM | POA: Diagnosis not present

## 2021-09-24 DIAGNOSIS — I2510 Atherosclerotic heart disease of native coronary artery without angina pectoris: Secondary | ICD-10-CM | POA: Diagnosis not present

## 2021-09-24 DIAGNOSIS — E871 Hypo-osmolality and hyponatremia: Secondary | ICD-10-CM | POA: Diagnosis not present

## 2021-09-24 DIAGNOSIS — I5041 Acute combined systolic (congestive) and diastolic (congestive) heart failure: Secondary | ICD-10-CM | POA: Diagnosis not present

## 2021-09-24 DIAGNOSIS — I10 Essential (primary) hypertension: Secondary | ICD-10-CM | POA: Diagnosis not present

## 2021-09-24 LAB — BASIC METABOLIC PANEL
Anion gap: 8 (ref 5–15)
BUN: 27 mg/dL — ABNORMAL HIGH (ref 8–23)
CO2: 26 mmol/L (ref 22–32)
Calcium: 8.3 mg/dL — ABNORMAL LOW (ref 8.9–10.3)
Chloride: 93 mmol/L — ABNORMAL LOW (ref 98–111)
Creatinine, Ser: 1.04 mg/dL (ref 0.61–1.24)
GFR, Estimated: >60 mL/min (ref >60)
Glucose, Bld: 107 mg/dL — ABNORMAL HIGH (ref 70–99)
Potassium: 4.1 mmol/L (ref 3.5–5.1)
Sodium: 127 mmol/L — ABNORMAL LOW (ref 135–145)

## 2021-09-24 LAB — CBC
HCT: 29.6 % — ABNORMAL LOW (ref 39.0–52.0)
Hemoglobin: 10.1 g/dL — ABNORMAL LOW (ref 13.0–17.0)
MCH: 35.2 pg — ABNORMAL HIGH (ref 26.0–34.0)
MCHC: 34.1 g/dL (ref 30.0–36.0)
MCV: 103.1 fL — ABNORMAL HIGH (ref 80.0–100.0)
Platelets: 119 10*3/uL — ABNORMAL LOW (ref 150–400)
RBC: 2.87 MIL/uL — ABNORMAL LOW (ref 4.22–5.81)
RDW: 14 % (ref 11.5–15.5)
WBC: 5.4 10*3/uL (ref 4.0–10.5)
nRBC: 0 % (ref 0.0–0.2)

## 2021-09-24 MED ORDER — ATORVASTATIN CALCIUM 40 MG PO TABS
40.0000 mg | ORAL_TABLET | Freq: Every day | ORAL | Status: DC
  Administered 2021-09-24 – 2021-09-25 (×2): 40 mg via ORAL
  Filled 2021-09-24 (×2): qty 1

## 2021-09-24 NOTE — Progress Notes (Signed)
Occupational Therapy Treatment Patient Details Name: Albert Allen MRN: [id number] DOB: 13-Aug-1923 Today's Date: 09/24/2021   History of present illness Pt is a 87 y.o. male admitted 09/19/21 with SOB. CTA with large bilateral pleural effusions. Workup also for CHF exacerbation. S/p L thoracentesis 10/22, R thoracentesis 10/24. PMH includes CAD, HTN, anemia, asthma, COPD.   OT comments  Albert Allen is progressing well with improved activity tolerance for OOB ADLs. Overall pt was supervision for all transfers and mobility without AD. Standing toileting and grooming tasks also supervision for safety only. Pt tolerated about 5 minutes of standing functional tasks with c/o mild back pain, VSS on RA. He shared that he normally wear a back brace at home when he is in pain and "moving around." Pt continues to benefit from OT acutely. D/c recommendation remains appropriate.    Recommendations for follow up therapy are one component of a multi-disciplinary discharge planning process, led by the attending physician.  Recommendations may be updated based on patient status, additional functional criteria and insurance authorization.    Follow Up Recommendations  No OT follow up       Equipment Recommendations  None recommended by OT       Precautions / Restrictions Precautions Precautions: Fall Precaution Comments: watch BP Restrictions Weight Bearing Restrictions: No       Mobility Bed Mobility Overal bed mobility: Modified Independent                  Transfers Overall transfer level: Independent                       Balance Overall balance assessment: Needs assistance Sitting-balance support: Feet supported;No upper extremity supported Sitting balance-Leahy Scale: Good     Standing balance support: During functional activity;No upper extremity supported Standing balance-Leahy Scale: Good                             ADL either performed or assessed  with clinical judgement   ADL Overall ADL's : Needs assistance/impaired     Grooming: Supervision/safety;Standing Grooming Details (indicate cue type and reason): groomed at the sink with supervision for safety. mildly unsteady, no overt LOB                 Toilet Transfer: Min guard;Ambulation Toilet Transfer Details (indicate cue type and reason): urinated while standing at the toilet Toileting- Clothing Manipulation and Hygiene: Supervision/safety Toileting - Clothing Manipulation Details (indicate cue type and reason): managed clothing in standing no UE support     Functional mobility during ADLs: Min guard General ADL Comments: pt improving with OOB activity tolerance for ADLs, tolerated ~5 minutes of tasks without rest break. c/o back pain.     Vision   Vision Assessment?: No apparent visual deficits   Perception     Praxis      Cognition Arousal/Alertness: Awake/alert Behavior During Therapy: WFL for tasks assessed/performed Overall Cognitive Status: Within Functional Limits for tasks assessed                                                  General Comments VSS onRA c/o back pain and dentures not fitting well    Pertinent Vitals/ Pain       Pain Assessment: Faces Faces Pain Scale: Hurts  little more Pain Location: back Pain Descriptors / Indicators: Sore Pain Intervention(s): Monitored during session   Frequency  Min 2X/week        Progress Toward Goals  OT Goals(current goals can now be found in the care plan section)  Progress towards OT goals: Progressing toward goals  Acute Rehab OT Goals OT Goal Formulation: With patient Time For Goal Achievement: 10/05/21 Potential to Achieve Goals: Fair ADL Goals Pt Will Perform Grooming: Independently;standing Pt Will Transfer to Toilet: Independently;ambulating Pt Will Perform Tub/Shower Transfer: with modified independence;shower seat;ambulating Pt/caregiver will Perform Home  Exercise Program: Increased strength;Both right and left upper extremity;With written HEP provided Additional ADL Goal #1: pt will tolerate at least 5 minutes of OOB functional activity to improve activity tolerance for ADLs  Plan Discharge plan remains appropriate       AM-PAC OT "6 Clicks" Daily Activity     Outcome Measure   Help from another person eating meals?: None Help from another person taking care of personal grooming?: A Little Help from another person toileting, which includes using toliet, bedpan, or urinal?: A Little Help from another person bathing (including washing, rinsing, drying)?: A Little Help from another person to put on and taking off regular upper body clothing?: None Help from another person to put on and taking off regular lower body clothing?: A Little 6 Click Score: 20    End of Session Equipment Utilized During Treatment: Gait belt  OT Visit Diagnosis: Other abnormalities of gait and mobility (R26.89);Muscle weakness (generalized) (M62.81)   Activity Tolerance Patient tolerated treatment well   Patient Left in bed;with call bell/phone within reach   Nurse Communication Mobility status        Time: 1555-1610 OT Time Calculation (min): 15 min  Charges: OT General Charges $OT Visit: 1 Visit OT Treatments $Self Care/Home Management : 8-22 mins   Albert Allen A Albert Allen 09/24/2021, 4:52 PM

## 2021-09-24 NOTE — Progress Notes (Signed)
PROGRESS NOTE    Albert Allen  XBJ:[id number] DOB: 1923-09-23 DOA: 09/18/2021 PCP: [name], MD    Brief Narrative:   Albert Allen is a 87 y.o. male with medical history significant of anemia, alcoholic polyneuropathy, COPD, hypertension, hypothyroidism,LBBB, gerd presented to ED with complaints of shortness of breath.  Found to have acute CHF exacerbation.  Will hospitalize for further management.  Echocardiogram showed EF to be 20 to 25%.  Cardiology was consulted.  Subsequently developed hyponatremia.  Nephrology was consulted.    Assessment & Plan:  Acute on chronic systolic and diastolic heart failure Initially started with gentle diuresis with IV Lasix 20 mg daily and transitioned to oral lasix.  Blood Lasix was held due to hypotension as well as hyponatremia. Echocardiogram showed LVEF of 20 to 25%, with decreased function, The left ventricle demonstrates regional wall motion abnormalities (see scoring diagram/findings for description). Left ventricular diastolic parameters are indeterminate. Elevated left ventricular end-diastolic pressure. Elevated troponins probably from demand ischemia from acute CHF.  Bilateral pleural effusions Likely due to CHF.  Underwent bilateral thoracentesis.  Noted to be a transudative process.  Currently saturating normal on room air.  Hyponatremia Likely a combination of CHF along with overdiuresis.  Sodium level has improved to 127 today from 124.  Nephrology is following.  Appreciate their assistance.  Started back on oral furosemide.  Paroxysmal atrial flutter/fibrillation Patient was started on IV heparin for anticoagulation.  Subsequently transitioned to Eliquis.  Currently in sinus rhythm.    Hypothyroidism TSH elevated at 8.  Free T4 normal.  Nephrology has increased the dose of levothyroxine.   COPD Stable.  Continue with duo nebs.  Anxiety Continue with Xanax.  Macrocytic anemia Anemia panel shows low ferritin and  elevated TIBC.  Will benefit from iron supplementation.  Folic acid level was normal.  Vitamin B12 level was 539.  GERD  Stable on protonix.   BPH:  On flomax.   Mild thrombocytopenia:  No bleeding seen.  Counts is stable.  Mild AKI Possibly from overdiuresis.  Improved this morning   DVT prophylaxis: heparin.  Code Status: DNR Family Communication: No family at bedside Disposition: Hopefully return home when improved     Consultants:  CARDIOLOGY IR  Nephrology  Procedures:  Transthoracic echocardiogram echocardiogram.  Thoracentesis .  Antimicrobials: none.    Subjective: Denies any chest pain shortness of breath.  Inquiring as to when he can go home.  Objective: Vitals:   09/23/21 1744 09/23/21 2150 09/24/21 0536 09/24/21 0927  BP: (!) 116/56 (!) 103/55 110/61 (!) 116/48  Pulse: 72 90 91 72  Resp:   18 18  Temp: 97.6 F (36.4 C) (!) 97.5 F (36.4 C) (!) 97.5 F (36.4 C) (!) 97.5 F (36.4 C)  TempSrc: Oral Oral Oral Oral  SpO2: 100% 98% 100% 97%  Weight:   52.9 kg   Height:        Intake/Output Summary (Last 24 hours) at 09/24/2021 1306 Last data filed at 09/24/2021 0200 Gross per 24 hour  Intake 240 ml  Output 400 ml  Net -160 ml    Filed Weights   09/22/21 0500 09/23/21 0545 09/24/21 0536  Weight: 53.3 kg 53.3 kg 52.9 kg    Examination:  General appearance: Awake alert.  In no distress Resp: Diminished air entry at the bases with few crackles bilaterally.  No wheezing or rhonchi. Cardio: S1-S2 is normal regular.  No S3-S4.  No rubs murmurs or bruit GI: Abdomen is soft.  Nontender  nondistended.  Bowel sounds are present normal.  No masses organomegaly Extremities: Mild edema bilateral lower extremity Neurologic:   No focal neurological deficits.       Data Reviewed: I have personally reviewed following labs and imaging studies  CBC: Recent Labs  Lab 09/18/21 2108 09/22/21 0215 09/23/21 0253 09/24/21 0234  WBC 7.0 5.8 5.7 5.4   HGB 11.9* 11.2* 10.3* 10.1*  HCT 35.5* 33.0* 30.5* 29.6*  MCV 104.1* 104.1* 104.1* 103.1*  PLT 120* 117* 109* 119*     Basic Metabolic Panel: Recent Labs  Lab 09/20/21 0319 09/21/21 0220 09/22/21 0215 09/23/21 0253 09/24/21 0234  NA 130* 128* 128* 124* 127*  K 4.1 4.2 4.2 4.3 4.1  CL 98 96* 93* 91* 93*  CO2 23 25 28 26 26   GLUCOSE 113* 103* 113* 104* 107*  BUN 18 21 25* 27* 27*  CREATININE 1.05 1.03 1.19 1.26* 1.04  CALCIUM 8.5* 8.5* 8.7* 8.3* 8.3*  MG 1.9  --   --   --   --      GFR: Estimated Creatinine Clearance: 31.1 mL/min (by C-G formula based on SCr of 1.04 mg/dL).  Liver Function Tests: Recent Labs  Lab 09/19/21 1858 09/20/21 0319  AST 12* 11*  ALT 9 7  ALKPHOS 48 51  BILITOT 0.8 0.9  PROT 5.6* 5.4*  ALBUMIN 3.1* 3.0*     CBG: No results for input(s): GLUCAP in the last 168 hours.   Recent Results (from the past 240 hour(s))  Resp Panel by RT-PCR (Flu A&B, Covid) Nasopharyngeal Swab     Status: None   Collection Time: 09/19/21  1:48 AM   Specimen: Nasopharyngeal Swab; Nasopharyngeal(NP) swabs in vial transport medium  Result Value Ref Range Status   SARS Coronavirus 2 by RT PCR NEGATIVE NEGATIVE Final    Comment: (NOTE) SARS-CoV-2 target nucleic acids are NOT DETECTED.  The SARS-CoV-2 RNA is generally detectable in upper respiratory specimens during the acute phase of infection. The lowest concentration of SARS-CoV-2 viral copies this assay can detect is 138 copies/mL. A negative result does not preclude SARS-Cov-2 infection and should not be used as the sole basis for treatment or other patient management decisions. A negative result may occur with  improper specimen collection/handling, submission of specimen other than nasopharyngeal swab, presence of viral mutation(s) within the areas targeted by this assay, and inadequate number of viral copies(<138 copies/mL). A negative result must be combined with clinical observations, patient  history, and epidemiological information. The expected result is Negative.  Fact Sheet for Patients:  EntrepreneurPulse.com.au  Fact Sheet for Healthcare Providers:  IncredibleEmployment.be  This test is no t yet approved or cleared by the [place] FDA and  has been authorized for detection and/or diagnosis of SARS-CoV-2 by FDA under an Emergency Use Authorization (EUA). This EUA will remain  in effect (meaning this test can be used) for the duration of the COVID-19 declaration under Section 564(b)(1) of the Act, 21 U.S.C.section 360bbb-3(b)(1), unless the authorization is terminated  or revoked sooner.       Influenza A by PCR NEGATIVE NEGATIVE Final   Influenza B by PCR NEGATIVE NEGATIVE Final    Comment: (NOTE) The Xpert Xpress SARS-CoV-2/FLU/RSV plus assay is intended as an aid in the diagnosis of influenza from Nasopharyngeal swab specimens and should not be used as a sole basis for treatment. Nasal washings and aspirates are unacceptable for Xpert Xpress SARS-CoV-2/FLU/RSV testing.  Fact Sheet for Patients: EntrepreneurPulse.com.au  Fact Sheet for Healthcare Providers: IncredibleEmployment.be  This test is not yet approved or cleared by the [place] and has been authorized for detection and/or diagnosis of SARS-CoV-2 by FDA under an Emergency Use Authorization (EUA). This EUA will remain in effect (meaning this test can be used) for the duration of the COVID-19 declaration under Section 564(b)(1) of the Act, 21 U.S.C. section 360bbb-3(b)(1), unless the authorization is terminated or revoked.  Performed at [facility], [address], [place], [place] [address]   Culture, body fluid w Gram Stain-bottle     Status: None (Preliminary result)   Collection Time: 09/20/21 11:46 AM   Specimen: Pleura  Result Value Ref Range Status   Specimen Description PLEURAL  FLUID  Final   Special Requests LUNG LEFT LOWER LOBE  Final   Culture   Final    NO GROWTH 4 DAYS Performed at [facility], [address], [place], [place] [address]    Report Status PENDING  Incomplete  Gram stain     Status: None   Collection Time: 09/20/21 11:46 AM   Specimen: Pleura  Result Value Ref Range Status   Specimen Description PLEURAL FLUID  Final   Special Requests LUNG LEFT LOWER LOBE  Final   Gram Stain   Final    WBC PRESENT,BOTH PMN AND MONONUCLEAR NO ORGANISMS SEEN CYTOSPIN SMEAR Performed at [facility], [address], [place], [place] [address]    Report Status 09/20/2021 FINAL  Final          Radiology Studies: DG Chest 1 View  Result Date: 09/22/2021 CLINICAL DATA:  Status post right thoracentesis. EXAM: CHEST  1 VIEW COMPARISON:  09/20/2021 FINDINGS: Stable cardiomediastinal contours. Aortic atherosclerotic calcifications. Decreased volume of right pleural effusion status post thoracentesis. No pneumothorax visualized. Small left pleural effusion is identified. No airspace opacities or signs of interstitial edema. Degenerative changes noted within the right glenohumeral joint. IMPRESSION: 1. Decreased volume of right pleural effusion status post thoracentesis. 2. No pneumothorax. Electronically Signed   By: Kerby Moors M.D.   On: 09/22/2021 15:05   IR THORACENTESIS ASP PLEURAL SPACE W/IMG GUIDE  Result Date: 09/22/2021 INDICATION: Pleural Effusion EXAM: ULTRASOUND GUIDED RIGHT THORACENTESIS MEDICATIONS: None. COMPLICATIONS: None immediate. PROCEDURE: An ultrasound guided thoracentesis was thoroughly discussed with the patient and questions answered. The benefits, risks, alternatives and complications were also discussed. The patient understands and wishes to proceed with the procedure. Written consent was obtained. Ultrasound was performed to localize and mark an adequate pocket of fluid in the RIGHT chest. The area was then prepped and  draped in the normal sterile fashion. 1% Lidocaine was used for local anesthesia. Under ultrasound guidance a 6 Fr Safe-T-Centesis catheter was introduced. Thoracentesis was performed. The catheter was removed and a dressing applied. FINDINGS: A total of approximately 567ml of amber fluid was removed. IMPRESSION: Successful ultrasound guided right thoracentesis yielding 550cc amber of pleural fluid. Electronically Signed   By: Miachel Roux M.D.   On: 09/22/2021 16:56        Scheduled Meds:  apixaban  2.5 mg Oral BID   atorvastatin  40 mg Oral QHS   feeding supplement  237 mL Oral BID BM   furosemide  20 mg Oral Daily   influenza vaccine adjuvanted  0.5 mL Intramuscular Tomorrow-1000   levothyroxine  100 mcg Oral Q0600   linaclotide  145 mcg Oral QAC breakfast   multivitamin with minerals  1 tablet Oral Daily   pantoprazole  40 mg Oral Daily  tamsulosin  0.4 mg Oral Daily   vitamin B-12  100 mcg Oral Daily   Continuous Infusions:     LOS: 5 days    Bonnielee Haff, MD [facility]  09/24/2021, 1:06 PM

## 2021-09-24 NOTE — Progress Notes (Signed)
Progress Note  Patient Name: Albert Allen Date of Encounter: 09/24/2021  [facility] Cardiologist: Lauree Chandler, MD   Subjective   Cardiology asked to reevaluate patient after discontinuation of heart medications.  He is feeling much better after improved blood pressure and right thoracentesis.  Breathing stable.  Denies chest pain, palpitation or dizziness.  Inpatient Medications    Scheduled Meds:  apixaban  2.5 mg Oral BID   feeding supplement  237 mL Oral BID BM   furosemide  20 mg Oral Daily   influenza vaccine adjuvanted  0.5 mL Intramuscular Tomorrow-1000   levothyroxine  100 mcg Oral Q0600   linaclotide  145 mcg Oral QAC breakfast   multivitamin with minerals  1 tablet Oral Daily   pantoprazole  40 mg Oral Daily   tamsulosin  0.4 mg Oral Daily   vitamin B-12  100 mcg Oral Daily   Continuous Infusions:  PRN Meds: acetaminophen, albuterol, ALPRAZolam, ipratropium-albuterol, lidocaine (PF), ondansetron (ZOFRAN) IV   Vital Signs    Vitals:   09/23/21 1550 09/23/21 1744 09/23/21 2150 09/24/21 0536  BP: (!) 87/51 (!) 116/56 (!) 103/55 110/61  Pulse: 77 72 90 91  Resp:    18  Temp:  97.6 F (36.4 C) (!) 97.5 F (36.4 C) (!) 97.5 F (36.4 C)  TempSrc:  Oral Oral Oral  SpO2: 99% 100% 98% 100%  Weight:    52.9 kg  Height:        Intake/Output Summary (Last 24 hours) at 09/24/2021 5621 Last data filed at 09/24/2021 0200 Gross per 24 hour  Intake 360 ml  Output 400 ml  Net -40 ml   Last 3 Weights 09/24/2021 09/23/2021 09/22/2021  Weight (lbs) 116 lb 9.6 oz 117 lb 8 oz 117 lb 8 oz  Weight (kg) 52.889 kg 53.298 kg 53.298 kg      Telemetry    Sinus rhythm at controlled ventricular rate- Personally Reviewed  ECG   No new tracing Physical Exam   GEN: Frail elderly male in no acute distress.   Neck: No JVD Cardiac: RRR, no murmurs, rubs, or gallops.  Respiratory: Clear to auscultation bilaterally. GI: Soft, nontender, non-distended   MS: No edema; No deformity. Neuro:  Nonfocal  Psych: Normal affect   Labs    High Sensitivity Troponin:   Recent Labs  Lab 09/18/21 2108 09/19/21 0001  TROPONINIHS 93* 115*     Chemistry Recent Labs  Lab 09/19/21 1858 09/20/21 0319 09/21/21 0220 09/22/21 0215 09/23/21 0253 09/24/21 0234  NA  --  130*   < > 128* 124* 127*  K  --  4.1   < > 4.2 4.3 4.1  CL  --  98   < > 93* 91* 93*  CO2  --  23   < > 28 26 26   GLUCOSE  --  113*   < > 113* 104* 107*  BUN  --  18   < > 25* 27* 27*  CREATININE  --  1.05   < > 1.19 1.26* 1.04  CALCIUM  --  8.5*   < > 8.7* 8.3* 8.3*  MG  --  1.9  --   --   --   --   PROT 5.6* 5.4*  --   --   --   --   ALBUMIN 3.1* 3.0*  --   --   --   --   AST 12* 11*  --   --   --   --  ALT 9 7  --   --   --   --   ALKPHOS 48 51  --   --   --   --   BILITOT 0.8 0.9  --   --   --   --   GFRNONAA  --  >60   < > 56* 52* >60  ANIONGAP  --  9   < > 7 7 8    < > = values in this interval not displayed.    Hematology Recent Labs  Lab 09/22/21 0215 09/23/21 0253 09/24/21 0234  WBC 5.8 5.7 5.4  RBC 3.17* 2.93* 2.87*  HGB 11.2* 10.3* 10.1*  HCT 33.0* 30.5* 29.6*  MCV 104.1* 104.1* 103.1*  MCH 35.3* 35.2* 35.2*  MCHC 33.9 33.8 34.1  RDW 14.3 14.4 14.0  PLT 117* 109* 119*   Thyroid  Recent Labs  Lab 09/20/21 0319 09/21/21 0220  TSH 8.620*  --   FREET4  --  1.02    BNP Recent Labs  Lab 09/19/21 0001  BNP 1,425.5*     Radiology    DG Chest 1 View  Result Date: 09/22/2021 CLINICAL DATA:  Status post right thoracentesis. EXAM: CHEST  1 VIEW COMPARISON:  09/20/2021 FINDINGS: Stable cardiomediastinal contours. Aortic atherosclerotic calcifications. Decreased volume of right pleural effusion status post thoracentesis. No pneumothorax visualized. Small left pleural effusion is identified. No airspace opacities or signs of interstitial edema. Degenerative changes noted within the right glenohumeral joint. IMPRESSION: 1. Decreased volume of right  pleural effusion status post thoracentesis. 2. No pneumothorax. Electronically Signed   By: Kerby Moors M.D.   On: 09/22/2021 15:05   IR THORACENTESIS ASP PLEURAL SPACE W/IMG GUIDE  Result Date: 09/22/2021 INDICATION: Pleural Effusion EXAM: ULTRASOUND GUIDED RIGHT THORACENTESIS MEDICATIONS: None. COMPLICATIONS: None immediate. PROCEDURE: An ultrasound guided thoracentesis was thoroughly discussed with the patient and questions answered. The benefits, risks, alternatives and complications were also discussed. The patient understands and wishes to proceed with the procedure. Written consent was obtained. Ultrasound was performed to localize and mark an adequate pocket of fluid in the RIGHT chest. The area was then prepped and draped in the normal sterile fashion. 1% Lidocaine was used for local anesthesia. Under ultrasound guidance a 6 Fr Safe-T-Centesis catheter was introduced. Thoracentesis was performed. The catheter was removed and a dressing applied. FINDINGS: A total of approximately 537ml of amber fluid was removed. IMPRESSION: Successful ultrasound guided right thoracentesis yielding 550cc amber of pleural fluid. Electronically Signed   By: Miachel Roux M.D.   On: 09/22/2021 16:56    Cardiac Studies   Echo 09/20/21  1. Left ventricular ejection fraction, by estimation, is 20 to 25%. The  left ventricle has severely decreased function. The left ventricle  demonstrates regional wall motion abnormalities (see scoring  diagram/findings for description). Left ventricular  diastolic parameters are indeterminate. Elevated left ventricular  end-diastolic pressure.   2. Right ventricular systolic function is normal. The right ventricular  size is normal. There is normal pulmonary artery systolic pressure.   3. The mitral valve is grossly normal. Mild to moderate mitral valve  regurgitation. No evidence of mitral stenosis.   4. The aortic valve was not well visualized. There is moderate   calcification of the aortic valve. Aortic valve regurgitation is trivial.  Mild to moderate aortic valve sclerosis/calcification is present, without  any evidence of aortic stenosis.   5. The inferior vena cava is normal in size with greater than 50%  respiratory variability, suggesting right atrial  pressure of 3 mmHg.   Comparison(s): Changes from prior study are noted.   Patient Profile     87 y.o. male with PMH of CAD, HTN, anemia, COPD, GERD, EtOH and hypothyroidism presented with acute CHF, hospital course complicated by brief PAF  Assessment & Plan    Acute combined systolic and diastolic heart failure - Echocardiogram this admission showed LV function of 20 to 25% with wall motion abnormality (EF was 50 to 55% in 2017).  This was felt likely ischemic mediated cardiomyopathy but given his advanced age medical management recommended. -His Toprol-XL 25 mg daily, spironolactone 12.5 mg daily and lisinopril 2.5 mg daily discontinued yesterday by nephrologist secondary to soft blood pressure.  Systolic blood pressure was in 70s. -He is feeling much better with improved blood pressure since then. -Continue low-dose Lasix at 20 mg daily. Net I & O -1L. Weight 124>>116lb.   2.  Bilateral pleural effusion -s/p left thoracentesis on 10/22 with removal of 800 ml -s/p right thoracentesis on 10/24 with removal of 550 mm  3. CAD - Hx of known mid LAD 70-80% heavily calcified lesion in 2010 that was not amenable to PCI at the time. Given drop in EF, likely have 3v disease at this point. -Aspirin stopped due to need of anticoagulation -Beta-blocker stopped due to soft blood pressure - ? Role of statin at this age  87.  New paroxysmal atrial fibrillation -Spontaneously converted -Maintaining sinus rhythm -Continue Eliquis 2.5 mg twice daily  5.  Hyponatremia -Seen by nephrologist yesterday.  Improved NA today.   Signed off and final medications recommendation per Dr. Debara Pickett. Could consider  trial of low dose BB at 12.5mg  but he is feeling much better with SBP in 100s.    For questions or updates, please contact [place] Please consult [email or web address] for contact info under        SignedLeanor Kail, PA  09/24/2021, 9:04 AM

## 2021-09-24 NOTE — Progress Notes (Signed)
Subjective:  BP better-  sodium better-  he feels better as well  Objective Vital signs in last 24 hours: Vitals:   09/23/21 1744 09/23/21 2150 09/24/21 0536 09/24/21 0927  BP: (!) 116/56 (!) 103/55 110/61 (!) 116/48  Pulse: 72 90 91 72  Resp:   18 18  Temp: 97.6 F (36.4 C) (!) 97.5 F (36.4 C) (!) 97.5 F (36.4 C) (!) 97.5 F (36.4 C)  TempSrc: Oral Oral Oral Oral  SpO2: 100% 98% 100% 97%  Weight:   52.9 kg   Height:       Weight change: -0.408 kg  Intake/Output Summary (Last 24 hours) at 09/24/2021 1042 Last data filed at 09/24/2021 0200 Gross per 24 hour  Intake 360 ml  Output 400 ml  Net -40 ml    Assessment/Plan: 87 year old white male with possibly new onset heart failure with decreased ejection fraction and bilateral pleural effusions.  His hospitalization has been complicated by hypotension and hyponatremia Hyponatremia- I think may be mostly due to this diagnosis of heart failure.  His urine osmolality is appropriately low to the situation, so not really significant SIADH.  He says that his appetite is fairly good so it does not seem like tea and toast syndrome.  His TSH indicates not adequate control of his hypothyroidism,  increased his Synthroid dose from 88 to 100.  I started him on oral Lasix in an effort to dilute urine further to correct his sodium but not to lower his BP.    fluid restriction in place as well.  If sodium not worse tomorrow and no other issues possibly could be discharged  Hypotension-so does not seem like a great idea to try to treat him like traditional heart failure with an ACE or ARB, Aldactone, beta-blocker.  It does not seem his blood pressure will tolerate.  I have stopped all of these in the setting of a blood pressure of 70.    I did keep him on his Flomax however as he does describe LUTS. Is stable for now-  cardiologists aware-  they will try to add back meds as able but possibly seems like less is more at this point    [place]: Basic Metabolic Panel: Recent Labs  Lab 09/22/21 0215 09/23/21 0253 09/24/21 0234  NA 128* 124* 127*  K 4.2 4.3 4.1  CL 93* 91* 93*  CO2 28 26 26   GLUCOSE 113* 104* 107*  BUN 25* 27* 27*  CREATININE 1.19 1.26* 1.04  CALCIUM 8.7* 8.3* 8.3*   Liver Function Tests: Recent Labs  Lab 09/19/21 1858 09/20/21 0319  AST 12* 11*  ALT 9 7  ALKPHOS 48 51  BILITOT 0.8 0.9  PROT 5.6* 5.4*  ALBUMIN 3.1* 3.0*   No results for input(s): LIPASE, AMYLASE in the last 168 hours. No results for input(s): AMMONIA in the last 168 hours. CBC: Recent Labs  Lab 09/18/21 2108 09/22/21 0215 09/23/21 0253 09/24/21 0234  WBC 7.0 5.8 5.7 5.4  HGB 11.9* 11.2* 10.3* 10.1*  HCT 35.5* 33.0* 30.5* 29.6*  MCV 104.1* 104.1* 104.1* 103.1*  PLT 120* 117* 109* 119*   Cardiac Enzymes: No results for input(s): CKTOTAL, CKMB, CKMBINDEX, TROPONINI in the last 168 hours. CBG: No results for input(s): GLUCAP in the last 168 hours.  Iron Studies: No results for input(s): IRON, TIBC, TRANSFERRIN, FERRITIN in the last 72 hours. Studies/Results: DG Chest 1 View  Result Date: 09/22/2021 CLINICAL DATA:  Status post right  thoracentesis. EXAM: CHEST  1 VIEW COMPARISON:  09/20/2021 FINDINGS: Stable cardiomediastinal contours. Aortic atherosclerotic calcifications. Decreased volume of right pleural effusion status post thoracentesis. No pneumothorax visualized. Small left pleural effusion is identified. No airspace opacities or signs of interstitial edema. Degenerative changes noted within the right glenohumeral joint. IMPRESSION: 1. Decreased volume of right pleural effusion status post thoracentesis. 2. No pneumothorax. Electronically Signed   By: Kerby Moors M.D.   On: 09/22/2021 15:05   IR THORACENTESIS ASP PLEURAL SPACE W/IMG GUIDE  Result Date: 09/22/2021 INDICATION: Pleural Effusion EXAM: ULTRASOUND GUIDED RIGHT THORACENTESIS MEDICATIONS: None. COMPLICATIONS: None immediate.  PROCEDURE: An ultrasound guided thoracentesis was thoroughly discussed with the patient and questions answered. The benefits, risks, alternatives and complications were also discussed. The patient understands and wishes to proceed with the procedure. Written consent was obtained. Ultrasound was performed to localize and mark an adequate pocket of fluid in the RIGHT chest. The area was then prepped and draped in the normal sterile fashion. 1% Lidocaine was used for local anesthesia. Under ultrasound guidance a 6 Fr Safe-T-Centesis catheter was introduced. Thoracentesis was performed. The catheter was removed and a dressing applied. FINDINGS: A total of approximately 576ml of amber fluid was removed. IMPRESSION: Successful ultrasound guided right thoracentesis yielding 550cc amber of pleural fluid. Electronically Signed   By: Miachel Roux M.D.   On: 09/22/2021 16:56   Medications: Infusions:   Scheduled Medications:  apixaban  2.5 mg Oral BID   atorvastatin  40 mg Oral QHS   feeding supplement  237 mL Oral BID BM   furosemide  20 mg Oral Daily   influenza vaccine adjuvanted  0.5 mL Intramuscular Tomorrow-1000   levothyroxine  100 mcg Oral Q0600   linaclotide  145 mcg Oral QAC breakfast   multivitamin with minerals  1 tablet Oral Daily   pantoprazole  40 mg Oral Daily   tamsulosin  0.4 mg Oral Daily   vitamin B-12  100 mcg Oral Daily    have reviewed scheduled and prn medications.  Physical Exam: General:  NAD Heart: RRR Lungs: mostly clear Abdomen: soft, non tender Extremities: no edema     09/24/2021,10:42 AM  LOS: 5 days

## 2021-09-25 ENCOUNTER — Inpatient Hospital Stay (HOSPITAL_COMMUNITY): Payer: Medicare Other

## 2021-09-25 DIAGNOSIS — E871 Hypo-osmolality and hyponatremia: Secondary | ICD-10-CM | POA: Diagnosis not present

## 2021-09-25 DIAGNOSIS — I10 Essential (primary) hypertension: Secondary | ICD-10-CM | POA: Diagnosis not present

## 2021-09-25 DIAGNOSIS — I5041 Acute combined systolic (congestive) and diastolic (congestive) heart failure: Secondary | ICD-10-CM | POA: Diagnosis not present

## 2021-09-25 DIAGNOSIS — E782 Mixed hyperlipidemia: Secondary | ICD-10-CM | POA: Diagnosis not present

## 2021-09-25 DIAGNOSIS — E039 Hypothyroidism, unspecified: Secondary | ICD-10-CM | POA: Diagnosis not present

## 2021-09-25 LAB — CULTURE, BODY FLUID W GRAM STAIN -BOTTLE: Culture: NO GROWTH

## 2021-09-25 LAB — CBC
HCT: 28.6 % — ABNORMAL LOW (ref 39.0–52.0)
Hemoglobin: 10 g/dL — ABNORMAL LOW (ref 13.0–17.0)
MCH: 36 pg — ABNORMAL HIGH (ref 26.0–34.0)
MCHC: 35 g/dL (ref 30.0–36.0)
MCV: 102.9 fL — ABNORMAL HIGH (ref 80.0–100.0)
Platelets: 125 10*3/uL — ABNORMAL LOW (ref 150–400)
RBC: 2.78 MIL/uL — ABNORMAL LOW (ref 4.22–5.81)
RDW: 14.1 % (ref 11.5–15.5)
WBC: 5.4 10*3/uL (ref 4.0–10.5)
nRBC: 0 % (ref 0.0–0.2)

## 2021-09-25 LAB — RENAL FUNCTION PANEL
Albumin: 2.7 g/dL — ABNORMAL LOW (ref 3.5–5.0)
Anion gap: 7 (ref 5–15)
BUN: 24 mg/dL — ABNORMAL HIGH (ref 8–23)
CO2: 25 mmol/L (ref 22–32)
Calcium: 8.5 mg/dL — ABNORMAL LOW (ref 8.9–10.3)
Chloride: 94 mmol/L — ABNORMAL LOW (ref 98–111)
Creatinine, Ser: 0.96 mg/dL (ref 0.61–1.24)
GFR, Estimated: >60 mL/min (ref >60)
Glucose, Bld: 112 mg/dL — ABNORMAL HIGH (ref 70–99)
Phosphorus: 2.7 mg/dL (ref 2.5–4.6)
Potassium: 4.6 mmol/L (ref 3.5–5.1)
Sodium: 126 mmol/L — ABNORMAL LOW (ref 135–145)

## 2021-09-25 MED ORDER — FUROSEMIDE 10 MG/ML IJ SOLN
40.0000 mg | Freq: Two times a day (BID) | INTRAMUSCULAR | Status: AC
Start: 2021-09-25 — End: 2021-09-26
  Administered 2021-09-25 – 2021-09-26 (×2): 40 mg via INTRAVENOUS
  Filled 2021-09-25 (×2): qty 4

## 2021-09-25 NOTE — Progress Notes (Signed)
Nutrition Follow-up  DOCUMENTATION CODES:   Not applicable  INTERVENTION:   Continue Ensure Enlive po BID, each supplement provides 350 kcal and 20 grams of protein. Add Magic cup TID with meals, each supplement provides 290 kcal and 9 grams of protein Continue MVI with minerals daily. Recommend iron supplementation given low ferritin level.   NUTRITION DIAGNOSIS:   Inadequate oral intake related to decreased appetite as evidenced by per patient/family report.  GOAL:   Patient will meet greater than or equal to 90% of their needs  MONITOR:   PO intake, Supplement acceptance  REASON FOR ASSESSMENT:   Malnutrition Screening Tool    ASSESSMENT:   87 yo male admitted with CHF exacerbation. PMH includes anemia, B-12 deficiency, alcoholic polyneuropathy, COPD, HTN, HLD, hypothyroidism, LBBB, GERD, CHF.  Sodium remains low, IV Lasix added today.   Labs reviewed. Na 126, Ferritin 19 (10/22)  Medications reviewed and include Lasix, MVI with minerals, Protonix, Flomax, Vitamin B-12 tablet daily.  Admission weight 56.4 kg Current weight 53.1 kg Weight trending down with fluid removal. Patient is now underweight with BMI=18.3.  Currently on a heart healthy diet with 1500 ml fluid restriction. Meal intakes: 50-100% Also drinking Ensure supplements BID.   Diet Order:   Diet Order             Diet Heart Room service appropriate? Yes; Fluid consistency: Thin; Fluid restriction: 1500 mL Fluid  Diet effective now                   EDUCATION NEEDS:   Not appropriate for education at this time  Skin:  Skin Assessment: Reviewed RN Assessment  Last BM:  10/26  Height:   Ht Readings from Last 1 Encounters:  09/19/21 5\' 7"  (1.702 m)    Weight:   Wt Readings from Last 1 Encounters:  09/25/21 53.1 kg    BMI:  Body mass index is 18.34 kg/m.  Estimated Nutritional Needs:   Kcal:  1800-2000  Protein:  85-100 gm  Fluid:  1.7-1.8 L    Lucas Mallow, RD,  LDN, CNSC Please refer to Amion for contact information.

## 2021-09-25 NOTE — Care Management Important Message (Signed)
Important Message  Patient Details  Name: Albert Allen MRN: [id number] Date of Birth: 04-May-1923   Medicare Important Message Given:  Yes     Shelda Altes 09/25/2021, 11:27 AM

## 2021-09-25 NOTE — Progress Notes (Signed)
PT Cancellation Note  Patient Details Name: Albert Allen MRN: [id number] DOB: 1923/02/04   Cancelled Treatment:    Reason Eval/Treat Not Completed: Medical issues which prohibited therapy; patient reports increased SOB since earlier this am.  Just had chest x-ray.  Will hold off on PT today and attempt again another day.  RN aware.    Reginia Naas 09/25/2021, 10:52 AM Magda Kiel, PT Acute Rehabilitation Services ITVIF:[phone] Office:[phone] 09/25/2021

## 2021-09-25 NOTE — Plan of Care (Signed)
  Problem: Activity: Goal: Capacity to carry out activities will improve Outcome: Progressing   Problem: Activity: Goal: Risk for activity intolerance will decrease Outcome: Progressing   Problem: Activity: Goal: Capacity to carry out activities will improve Outcome: Progressing   Problem: Activity: Goal: Risk for activity intolerance will decrease Outcome: Progressing

## 2021-09-25 NOTE — Progress Notes (Signed)
Subjective:  BP better-  sodium stable-  he reports a "tight feeling"  in his chest-  CXR-  just a small amount of fluid on left-  not much different than on 10/24-  otherwise not feeling great either -   poor appetite Objective Vital signs in last 24 hours: Vitals:   09/24/21 2340 09/25/21 0340 09/25/21 0540 09/25/21 0700  BP: (!) 102/51 (!) 100/47 107/69 (!) 109/56  Pulse: 76 72 98 80  Resp: 15 16  16   Temp: (!) 97.5 F (36.4 C) 97.7 F (36.5 C)  97.6 F (36.4 C)  TempSrc: Oral Oral  Oral  SpO2: 100% 99% 99% 95%  Weight:  53.1 kg    Height:       Weight change: 0.227 kg  Intake/Output Summary (Last 24 hours) at 09/25/2021 1100 Last data filed at 09/25/2021 0301 Gross per 24 hour  Intake 240 ml  Output 450 ml  Net -210 ml    Assessment/Plan: 87 year old white male with new onset heart failure with decreased ejection fraction and bilateral pleural effusions.  His hospitalization has been complicated by hypotension and hyponatremia Hyponatremia- I think may be mostly due to this diagnosis of heart failure.  His urine osmolality is appropriately low to the situation, so not really significant SIADH.  He said that his appetite was fairly good so it does not seem like tea and toast syndrome.  His TSH indicates not adequate control of his hypothyroidism,  increased his Synthroid dose from 88 to 100.  I started him on oral Lasix in an effort to dilute urine further to correct his sodium but not to lower his BP.    fluid restriction in place as well.  If was feeling great-  I would be ok with discharge but the fact that he feels sob and his sodium is still low makes me think he still has excess-  will give iv lasix today to step up the game of fluid/water removal Hypotension-so does not seem like a great idea to try to treat him like traditional heart failure with an ACE or ARB, Aldactone, beta-blocker.  It does not seem his blood pressure will tolerate.  I have stopped all of these in the  setting of a blood pressure of 70.    I did keep him on his Flomax however as he does describe LUTS. Is stable for now-  cardiologists aware-  they will try to add back meds as able but possibly seems like less is more at this point    [place]: Basic Metabolic Panel: Recent Labs  Lab 09/23/21 0253 09/24/21 0234 09/25/21 0106  NA 124* 127* 126*  K 4.3 4.1 4.6  CL 91* 93* 94*  CO2 26 26 25   GLUCOSE 104* 107* 112*  BUN 27* 27* 24*  CREATININE 1.26* 1.04 0.96  CALCIUM 8.3* 8.3* 8.5*  PHOS  --   --  2.7   Liver Function Tests: Recent Labs  Lab 09/19/21 1858 09/20/21 0319 09/25/21 0106  AST 12* 11*  --   ALT 9 7  --   ALKPHOS 48 51  --   BILITOT 0.8 0.9  --   PROT 5.6* 5.4*  --   ALBUMIN 3.1* 3.0* 2.7*   No results for input(s): LIPASE, AMYLASE in the last 168 hours. No results for input(s): AMMONIA in the last 168 hours. CBC: Recent Labs  Lab 09/18/21 2108 09/22/21 0215 09/23/21 0253 09/24/21 0234 09/25/21 0106  WBC 7.0  5.8 5.7 5.4 5.4  HGB 11.9* 11.2* 10.3* 10.1* 10.0*  HCT 35.5* 33.0* 30.5* 29.6* 28.6*  MCV 104.1* 104.1* 104.1* 103.1* 102.9*  PLT 120* 117* 109* 119* 125*   Cardiac Enzymes: No results for input(s): CKTOTAL, CKMB, CKMBINDEX, TROPONINI in the last 168 hours. CBG: No results for input(s): GLUCAP in the last 168 hours.  Iron Studies: No results for input(s): IRON, TIBC, TRANSFERRIN, FERRITIN in the last 72 hours. Studies/Results: No results found. Medications: Infusions:   Scheduled Medications:  apixaban  2.5 mg Oral BID   atorvastatin  40 mg Oral QHS   feeding supplement  237 mL Oral BID BM   furosemide  20 mg Oral Daily   influenza vaccine adjuvanted  0.5 mL Intramuscular Tomorrow-1000   levothyroxine  100 mcg Oral Q0600   linaclotide  145 mcg Oral QAC breakfast   multivitamin with minerals  1 tablet Oral Daily   pantoprazole  40 mg Oral Daily   tamsulosin  0.4 mg Oral Daily   vitamin B-12  100 mcg Oral  Daily    have reviewed scheduled and prn medications.  Physical Exam: General:  NAD-  not as perky today-  sons here   Heart: RRR Lungs: mostly clear Abdomen: soft, non tender Extremities: no peripheral edema     09/25/2021,11:00 AM  LOS: 6 days

## 2021-09-25 NOTE — Progress Notes (Signed)
PROGRESS NOTE    PRINTICE HELLMER  JTT:[id number] DOB: 11/15/1923 DOA: 09/18/2021 PCP: [name], MD    Brief Narrative:   Albert Allen is a 87 y.o. male with medical history significant of anemia, alcoholic polyneuropathy, COPD, hypertension, hypothyroidism,LBBB, gerd presented to ED with complaints of shortness of breath.  Found to have acute CHF exacerbation.  Will hospitalize for further management.  Echocardiogram showed EF to be 20 to 25%.  Cardiology was consulted.  Subsequently developed hyponatremia.  Nephrology was consulted.    Assessment & Plan:  Acute on chronic systolic and diastolic heart failure Echocardiogram showed LVEF of 20 to 25%, with decreased function, The left ventricle demonstrates regional wall motion abnormalities (see scoring diagram/findings for description). Left ventricular diastolic parameters are indeterminate. Elevated left ventricular end-diastolic pressure. Elevated troponins probably from demand ischemia from acute CHF. Patient was initially given IV Lasix and then transitioned to oral however developed hypotension and elevated creatinine and low sodium levels.  Dosage of Lasix was adjusted.  Complains of dyspnea this morning.  Chest x-ray was done has not been read yet but does not show any significant findings.  He is saturating normal on room air.  Nephrology plans to give intravenous diuretics today.  Bilateral pleural effusions Likely due to CHF.  Underwent bilateral thoracentesis.  Noted to be a transudative process.  Seems to be stable for the most part.  Chest x-ray done this morning does not show any significant pleural effusions.  Final report is still pending  Hyponatremia Likely due to a combination of CHF along with overdiuresis.  Sodium level had improved but seems to have plateaued.  Nephrology continues to follow and assist.  To be given intravenous diuretics today.  Paroxysmal atrial flutter/fibrillation Patient was started on  IV heparin for anticoagulation.  Subsequently transitioned to Eliquis.  Currently in sinus rhythm.    Hypothyroidism TSH elevated at 8.  Free T4 normal.  Nephrology has increased the dose of levothyroxine.   COPD Stable.  Continue with duo nebs.  Anxiety Continue with Xanax.  Macrocytic anemia Anemia panel shows low ferritin and elevated TIBC.  Will benefit from iron supplementation at discharge.  Folic acid level was normal.  Vitamin B12 level was 539.  GERD  Stable on protonix.   BPH:  On flomax.   Mild thrombocytopenia:  No bleeding seen.  Stable.  Mild AKI Possibly from overdiuresis.  Appears to have resolved.  Continue to monitor while getting diuretics.   DVT prophylaxis: heparin.  Code Status: DNR Family Communication: No family at bedside Disposition: Hopefully return home when improved     Consultants:  CARDIOLOGY IR  Nephrology  Procedures:  Transthoracic echocardiogram echocardiogram.  Thoracentesis .  Antimicrobials: none.    Subjective: Complains of shortness of breath this morning.  No chest pain.  No dizziness or lightheadedness.  Objective: Vitals:   09/25/21 0340 09/25/21 0540 09/25/21 0700 09/25/21 1143  BP: (!) 100/47 107/69 (!) 109/56 (!) 118/56  Pulse: 72 98 80 82  Resp: 16  16 15   Temp: 97.7 F (36.5 C)  97.6 F (36.4 C)   TempSrc: Oral  Oral Oral  SpO2: 99% 99% 95% 98%  Weight: 53.1 kg     Height:        Intake/Output Summary (Last 24 hours) at 09/25/2021 1233 Last data filed at 09/25/2021 0301 Gross per 24 hour  Intake 240 ml  Output 450 ml  Net -210 ml    Filed Weights   09/23/21 0545 09/24/21  5427 09/25/21 0340  Weight: 53.3 kg 52.9 kg 53.1 kg    Examination:  General appearance: Awake alert.  In no distress Resp: Diminished air entry at the bases with few crackles.  No wheezing or rhonchi.  Mildly Tachypneic at rest.  No use of accessory muscles. Cardio: S1-S2 is normal regular.  No S3-S4.  No rubs murmurs or  bruit GI: Abdomen is soft.  Nontender nondistended.  Bowel sounds are present normal.  No masses organomegaly Extremities: No edema.  Full range of motion of lower extremities. Neurologic:   No focal neurological deficits.         Data Reviewed: I have personally reviewed following labs and imaging studies  CBC: Recent Labs  Lab 09/18/21 2108 09/22/21 0215 09/23/21 0253 09/24/21 0234 09/25/21 0106  WBC 7.0 5.8 5.7 5.4 5.4  HGB 11.9* 11.2* 10.3* 10.1* 10.0*  HCT 35.5* 33.0* 30.5* 29.6* 28.6*  MCV 104.1* 104.1* 104.1* 103.1* 102.9*  PLT 120* 117* 109* 119* 125*     Basic Metabolic Panel: Recent Labs  Lab 09/20/21 0319 09/21/21 0220 09/22/21 0215 09/23/21 0253 09/24/21 0234 09/25/21 0106  NA 130* 128* 128* 124* 127* 126*  K 4.1 4.2 4.2 4.3 4.1 4.6  CL 98 96* 93* 91* 93* 94*  CO2 23 25 28 26 26 25   GLUCOSE 113* 103* 113* 104* 107* 112*  BUN 18 21 25* 27* 27* 24*  CREATININE 1.05 1.03 1.19 1.26* 1.04 0.96  CALCIUM 8.5* 8.5* 8.7* 8.3* 8.3* 8.5*  MG 1.9  --   --   --   --   --   PHOS  --   --   --   --   --  2.7     GFR: Estimated Creatinine Clearance: 33.8 mL/min (by C-G formula based on SCr of 0.96 mg/dL).  Liver Function Tests: Recent Labs  Lab 09/19/21 1858 09/20/21 0319 09/25/21 0106  AST 12* 11*  --   ALT 9 7  --   ALKPHOS 48 51  --   BILITOT 0.8 0.9  --   PROT 5.6* 5.4*  --   ALBUMIN 3.1* 3.0* 2.7*     CBG: No results for input(s): GLUCAP in the last 168 hours.   Recent Results (from the past 240 hour(s))  Resp Panel by RT-PCR (Flu A&B, Covid) Nasopharyngeal Swab     Status: None   Collection Time: 09/19/21  1:48 AM   Specimen: Nasopharyngeal Swab; Nasopharyngeal(NP) swabs in vial transport medium  Result Value Ref Range Status   SARS Coronavirus 2 by RT PCR NEGATIVE NEGATIVE Final    Comment: (NOTE) SARS-CoV-2 target nucleic acids are NOT DETECTED.  The SARS-CoV-2 RNA is generally detectable in upper respiratory specimens during the  acute phase of infection. The lowest concentration of SARS-CoV-2 viral copies this assay can detect is 138 copies/mL. A negative result does not preclude SARS-Cov-2 infection and should not be used as the sole basis for treatment or other patient management decisions. A negative result may occur with  improper specimen collection/handling, submission of specimen other than nasopharyngeal swab, presence of viral mutation(s) within the areas targeted by this assay, and inadequate number of viral copies(<138 copies/mL). A negative result must be combined with clinical observations, patient history, and epidemiological information. The expected result is Negative.  Fact Sheet for Patients:  EntrepreneurPulse.com.au  Fact Sheet for Healthcare Providers:  IncredibleEmployment.be  This test is no t yet approved or cleared by the [place] and  has been authorized for  detection and/or diagnosis of SARS-CoV-2 by FDA under an Emergency Use Authorization (EUA). This EUA will remain  in effect (meaning this test can be used) for the duration of the COVID-19 declaration under Section 564(b)(1) of the Act, 21 U.S.C.section 360bbb-3(b)(1), unless the authorization is terminated  or revoked sooner.       Influenza A by PCR NEGATIVE NEGATIVE Final   Influenza B by PCR NEGATIVE NEGATIVE Final    Comment: (NOTE) The Xpert Xpress SARS-CoV-2/FLU/RSV plus assay is intended as an aid in the diagnosis of influenza from Nasopharyngeal swab specimens and should not be used as a sole basis for treatment. Nasal washings and aspirates are unacceptable for Xpert Xpress SARS-CoV-2/FLU/RSV testing.  Fact Sheet for Patients: EntrepreneurPulse.com.au  Fact Sheet for Healthcare Providers: IncredibleEmployment.be  This test is not yet approved or cleared by the [place] FDA and has been authorized for detection and/or  diagnosis of SARS-CoV-2 by FDA under an Emergency Use Authorization (EUA). This EUA will remain in effect (meaning this test can be used) for the duration of the COVID-19 declaration under Section 564(b)(1) of the Act, 21 U.S.C. section 360bbb-3(b)(1), unless the authorization is terminated or revoked.  Performed at [facility], [address], [place], [place] [address]   Culture, body fluid w Gram Stain-bottle     Status: None   Collection Time: 09/20/21 11:46 AM   Specimen: Pleura  Result Value Ref Range Status   Specimen Description PLEURAL FLUID  Final   Special Requests LUNG LEFT LOWER LOBE  Final   Culture   Final    NO GROWTH 5 DAYS Performed at [facility], [address] [place], [place] [address]    Report Status 09/25/2021 FINAL  Final  Gram stain     Status: None   Collection Time: 09/20/21 11:46 AM   Specimen: Pleura  Result Value Ref Range Status   Specimen Description PLEURAL FLUID  Final   Special Requests LUNG LEFT LOWER LOBE  Final   Gram Stain   Final    WBC PRESENT,BOTH PMN AND MONONUCLEAR NO ORGANISMS SEEN CYTOSPIN SMEAR Performed at [facility], [address], [place], [place] [address]    Report Status 09/20/2021 FINAL  Final          Radiology Studies: No results found.      Scheduled Meds:  apixaban  2.5 mg Oral BID   atorvastatin  40 mg Oral QHS   feeding supplement  237 mL Oral BID BM   furosemide  40 mg Intravenous Q12H   furosemide  20 mg Oral Daily   influenza vaccine adjuvanted  0.5 mL Intramuscular Tomorrow-1000   levothyroxine  100 mcg Oral Q0600   linaclotide  145 mcg Oral QAC breakfast   multivitamin with minerals  1 tablet Oral Daily   pantoprazole  40 mg Oral Daily   tamsulosin  0.4 mg Oral Daily   vitamin B-12  100 mcg Oral Daily   Continuous Infusions:     LOS: 6 days    Bonnielee Haff, MD [facility]  09/25/2021, 12:33 PM

## 2021-09-25 NOTE — Progress Notes (Signed)
Progress Note  Patient Name: RAYMUNDO ROUT Date of Encounter: 09/25/2021  [facility] Cardiologist: Lauree Chandler, MD   Subjective   Some chest tightness, also SOB has tried inhaler but does not feel he can pull in medication.  This is how he felt on admit though worse  Inpatient Medications    Scheduled Meds:  apixaban  2.5 mg Oral BID   atorvastatin  40 mg Oral QHS   feeding supplement  237 mL Oral BID BM   furosemide  20 mg Oral Daily   influenza vaccine adjuvanted  0.5 mL Intramuscular Tomorrow-1000   levothyroxine  100 mcg Oral Q0600   linaclotide  145 mcg Oral QAC breakfast   multivitamin with minerals  1 tablet Oral Daily   pantoprazole  40 mg Oral Daily   tamsulosin  0.4 mg Oral Daily   vitamin B-12  100 mcg Oral Daily   Continuous Infusions:  PRN Meds: acetaminophen, albuterol, ALPRAZolam, ipratropium-albuterol, lidocaine (PF), ondansetron (ZOFRAN) IV   Vital Signs    Vitals:   09/24/21 2340 09/25/21 0340 09/25/21 0540 09/25/21 0700  BP: (!) 102/51 (!) 100/47 107/69 (!) 109/56  Pulse: 76 72 98 80  Resp: 15 16  16   Temp: (!) 97.5 F (36.4 C) 97.7 F (36.5 C)  97.6 F (36.4 C)  TempSrc: Oral Oral  Oral  SpO2: 100% 99% 99% 95%  Weight:  53.1 kg    Height:        Intake/Output Summary (Last 24 hours) at 09/25/2021 0903 Last data filed at 09/25/2021 0301 Gross per 24 hour  Intake 240 ml  Output 450 ml  Net -210 ml   Last 3 Weights 09/25/2021 09/24/2021 09/23/2021  Weight (lbs) 117 lb 1.6 oz 116 lb 9.6 oz 117 lb 8 oz  Weight (kg) 53.116 kg 52.889 kg 53.298 kg      Telemetry    SR with 1st degree AV block and LBBB - Personally Reviewed  ECG    No new - Personally Reviewed  Physical Exam   GEN: No acute distress.   Neck: No JVD Cardiac: RRR, no murmurs, rubs, or gallops.  Respiratory: diminished Rt base and wheezes Rt > Lt to auscultation bilaterally. GI: Soft, nontender, non-distended  MS: No edema; No deformity. Neuro:   Nonfocal  Psych: Normal affect   Labs    High Sensitivity Troponin:   Recent Labs  Lab 09/18/21 2108 09/19/21 0001  TROPONINIHS 93* 115*     Chemistry Recent Labs  Lab 09/19/21 1858 09/20/21 0319 09/21/21 0220 09/23/21 0253 09/24/21 0234 09/25/21 0106  NA  --  130*   < > 124* 127* 126*  K  --  4.1   < > 4.3 4.1 4.6  CL  --  98   < > 91* 93* 94*  CO2  --  23   < > 26 26 25   GLUCOSE  --  113*   < > 104* 107* 112*  BUN  --  18   < > 27* 27* 24*  CREATININE  --  1.05   < > 1.26* 1.04 0.96  CALCIUM  --  8.5*   < > 8.3* 8.3* 8.5*  MG  --  1.9  --   --   --   --   PROT 5.6* 5.4*  --   --   --   --   ALBUMIN 3.1* 3.0*  --   --   --  2.7*  AST 12* 11*  --   --   --   --  ALT 9 7  --   --   --   --   ALKPHOS 48 51  --   --   --   --   BILITOT 0.8 0.9  --   --   --   --   GFRNONAA  --  >60   < > 52* >60 >60  ANIONGAP  --  9   < > 7 8 7    < > = values in this interval not displayed.    Lipids No results for input(s): CHOL, TRIG, HDL, LABVLDL, LDLCALC, CHOLHDL in the last 168 hours.  Hematology Recent Labs  Lab 09/23/21 0253 09/24/21 0234 09/25/21 0106  WBC 5.7 5.4 5.4  RBC 2.93* 2.87* 2.78*  HGB 10.3* 10.1* 10.0*  HCT 30.5* 29.6* 28.6*  MCV 104.1* 103.1* 102.9*  MCH 35.2* 35.2* 36.0*  MCHC 33.8 34.1 35.0  RDW 14.4 14.0 14.1  PLT 109* 119* 125*   Thyroid  Recent Labs  Lab 09/20/21 0319 09/21/21 0220  TSH 8.620*  --   FREET4  --  1.02    BNP Recent Labs  Lab 09/19/21 0001  BNP 1,425.5*    DDimer No results for input(s): DDIMER in the last 168 hours.   Radiology    No results found.  Cardiac Studies   Echo 09/20/21  1. Left ventricular ejection fraction, by estimation, is 20 to 25%. The  left ventricle has severely decreased function. The left ventricle  demonstrates regional wall motion abnormalities (see scoring  diagram/findings for description). Left ventricular  diastolic parameters are indeterminate. Elevated left ventricular  end-diastolic  pressure.   2. Right ventricular systolic function is normal. The right ventricular  size is normal. There is normal pulmonary artery systolic pressure.   3. The mitral valve is grossly normal. Mild to moderate mitral valve  regurgitation. No evidence of mitral stenosis.   4. The aortic valve was not well visualized. There is moderate  calcification of the aortic valve. Aortic valve regurgitation is trivial.  Mild to moderate aortic valve sclerosis/calcification is present, without  any evidence of aortic stenosis.   5. The inferior vena cava is normal in size with greater than 50%  respiratory variability, suggesting right atrial pressure of 3 mmHg.   Comparison(s): Changes from prior study are noted.   Patient Profile     87 y.o. male with PMH of CAD, HTN, anemia, COPD, GERD, EtOH and hypothyroidism presented with acute CHF, hospital course complicated by brief PAF  Assessment & Plan    Acute combined systolic and diastolic heart failure - Echocardiogram this admission showed LV function of 20 to 25% with wall motion abnormality (EF was 50 to 55% in 2017).  This was felt likely ischemic mediated cardiomyopathy but given his advanced age medical management recommended. -His Toprol-XL 25 mg daily, spironolactone 12.5 mg daily and lisinopril 2.5 mg daily discontinued yesterday by nephrologist secondary to hypotension.  Systolic blood pressure was in 70s. -He is feeling much better with improved blood pressure since then. Though soft 100/47 to 109/56  currently 130/55  -Continue low-dose Lasix at 20 mg daily. Net I & O -1.2L. Weight 124>>116lb. >>116.82 lbs   2.  Bilateral pleural effusion -s/p left thoracentesis on 10/22 with removal of 800 ml -s/p right thoracentesis on 10/24 with removal of 550 mm   3. CAD - Hx of known mid LAD 70-80% heavily calcified lesion in 2010 that was not amenable to PCI at the time. Given drop in EF, likely have  3v disease at this point. -Aspirin stopped due  to need of anticoagulation -Beta-blocker stopped due to soft blood pressure - ? Role of statin at this age   22.  New paroxysmal atrial fibrillation -Spontaneously converted -Maintaining sinus rhythm -Continue Eliquis 2.5 mg twice daily   5.  Hyponatremia -Seen by nephrologist yesterday.  Improved NA today.          For questions or updates, please contact [place] Please consult [email or web address] for contact info under        Signed, Cecilie Kicks, NP  09/25/2021, 9:03 AM

## 2021-09-26 ENCOUNTER — Other Ambulatory Visit (HOSPITAL_COMMUNITY): Payer: Self-pay

## 2021-09-26 DIAGNOSIS — J90 Pleural effusion, not elsewhere classified: Secondary | ICD-10-CM

## 2021-09-26 DIAGNOSIS — I5041 Acute combined systolic (congestive) and diastolic (congestive) heart failure: Secondary | ICD-10-CM | POA: Diagnosis not present

## 2021-09-26 DIAGNOSIS — Z7189 Other specified counseling: Secondary | ICD-10-CM | POA: Diagnosis not present

## 2021-09-26 LAB — RENAL FUNCTION PANEL
Albumin: 2.9 g/dL — ABNORMAL LOW (ref 3.5–5.0)
Anion gap: 5 (ref 5–15)
BUN: 26 mg/dL — ABNORMAL HIGH (ref 8–23)
CO2: 29 mmol/L (ref 22–32)
Calcium: 8.5 mg/dL — ABNORMAL LOW (ref 8.9–10.3)
Chloride: 92 mmol/L — ABNORMAL LOW (ref 98–111)
Creatinine, Ser: 1.24 mg/dL (ref 0.61–1.24)
GFR, Estimated: 53 mL/min — ABNORMAL LOW (ref >60)
Glucose, Bld: 112 mg/dL — ABNORMAL HIGH (ref 70–99)
Phosphorus: 3.3 mg/dL (ref 2.5–4.6)
Potassium: 4.3 mmol/L (ref 3.5–5.1)
Sodium: 126 mmol/L — ABNORMAL LOW (ref 135–145)

## 2021-09-26 MED ORDER — POLYETHYLENE GLYCOL 3350 17 G PO PACK: 17.0000 g | PACK | Freq: Every day | ORAL | 0 refills | Status: AC

## 2021-09-26 MED ORDER — APIXABAN 2.5 MG PO TABS
2.5000 mg | ORAL_TABLET | Freq: Two times a day (BID) | ORAL | 0 refills | Status: DC
  Filled 2021-09-26: qty 60, 30d supply, fill #0

## 2021-09-26 MED ORDER — ATORVASTATIN CALCIUM 40 MG PO TABS: 40.0000 mg | ORAL_TABLET | Freq: Every day | ORAL | 1 refills | Status: AC

## 2021-09-26 MED ORDER — FUROSEMIDE 20 MG PO TABS
20.0000 mg | ORAL_TABLET | Freq: Every day | ORAL | Status: DC
  Administered 2021-09-26: 20 mg via ORAL
  Filled 2021-09-26: qty 1

## 2021-09-26 MED ORDER — FERROUS SULFATE 325 (65 FE) MG PO TABS: 325.0000 mg | ORAL_TABLET | Freq: Every day | ORAL | 3 refills | Status: AC

## 2021-09-26 MED ORDER — APIXABAN 2.5 MG PO TABS: 2.5000 mg | ORAL_TABLET | Freq: Two times a day (BID) | ORAL | 2 refills | Status: AC

## 2021-09-26 MED ORDER — LEVOTHYROXINE SODIUM 100 MCG PO TABS: 100.0000 ug | ORAL_TABLET | Freq: Every day | ORAL | 2 refills | Status: AC

## 2021-09-26 MED ORDER — FUROSEMIDE 20 MG PO TABS: 20.0000 mg | ORAL_TABLET | Freq: Every day | ORAL | 2 refills | Status: AC

## 2021-09-26 NOTE — Progress Notes (Signed)
[facility] ([facility])  Hospital Liaison: RN note         This patient has been referred to our palliative care services in the community.  [facility] will continue to follow for any discharge planning needs and to coordinate continuation of palliative care in the outpatient setting.    If you have questions or need assistance, please call [phone] or contact the hospital Liaison listed on AMION.      Thank you for this referral.         Mary Anne Robertson, RN, CCM  [facility] Hospital Liaison   [phone] 

## 2021-09-26 NOTE — Progress Notes (Signed)
Subjective:  BP stably low.  Sodium stable at 126 today.  BUN 20s, Cr stable 1.2.  I/Os yesterday NR / 1.6L.  wt down to 52.3  from 53.1 yesterday and 56.6 10/22.   After IV lasix yesterday he says his chest tightness completely resolved and he feels back to himself.  Denies HA, blurred vision, confusion.  Ate a good breakfast.  About to drink an ensure. Begging for d/c.  RN says he's been quite independent here.   Objective Vital signs in last 24 hours: Vitals:   09/25/21 1656 09/25/21 2048 09/26/21 0429 09/26/21 0500  BP: (!) 126/59 (!) 101/52 (!) 102/54   Pulse: 89 79 75   Resp: 16 19 18    Temp: 97.7 F (36.5 C) (!) 97.5 F (36.4 C) 98.2 F (36.8 C)   TempSrc: Oral Oral Oral   SpO2: 97% 96% 97%   Weight:    52.3 kg  Height:       Weight change: -0.862 kg  Intake/Output Summary (Last 24 hours) at 09/26/2021 1041 Last data filed at 09/26/2021 0715 Gross per 24 hour  Intake --  Output 1820 ml  Net -1820 ml     Assessment/Plan: 87 year old white male with new onset heart failure EF 20-25% and bilateral pleural effusions.  His hospitalization has been complicated by hypotension and hyponatremia Hyponatremia- I think may be mostly due to this diagnosis of heart failure.  His urine osmolality is appropriately low to the situation, so not really significant SIADH.  He said that his appetite was fairly good so it does not seem like tea and toast syndrome.  His TSH indicates not adequate control of his hypothyroidism,  increased his Synthroid dose from 88 to 100.  He rec'd diuresis with po lasix for a few days with improvement in Na from 124 to 128 but has stalled at 217.  He was having chest pressure yesterday which improved with 1 dose of IV lasix.  At this point I think discharge home with low sodium diet, lasix 20 daily with close f/u with PCP and cardiology would be the best plan - I don't think he needs additional diuresis and I wouldn't give a vaptan at this point to correct the number.   Hypotension-so does not seem like a great idea to try to treat him like traditional heart failure with an ACE or ARB, Aldactone, beta-blocker.  It does not seem his blood pressure will tolerate.  Remains on his Flomax however as he does describe LUTS. Is stable for now-  cardiologists aware-  they will try to add back meds as able but possibly seems like less is more at this point/  I favor d/c home at this point with outpt f/u.  Communicated this to primary.   Justin Mend    Labs: Basic Metabolic Panel: Recent Labs  Lab 09/24/21 0234 09/25/21 0106 09/26/21 0303  NA 127* 126* 126*  K 4.1 4.6 4.3  CL 93* 94* 92*  CO2 26 25 29   GLUCOSE 107* 112* 112*  BUN 27* 24* 26*  CREATININE 1.04 0.96 1.24  CALCIUM 8.3* 8.5* 8.5*  PHOS  --  2.7 3.3    Liver Function Tests: Recent Labs  Lab 09/19/21 1858 09/20/21 0319 09/25/21 0106 09/26/21 0303  AST 12* 11*  --   --   ALT 9 7  --   --   ALKPHOS 48 51  --   --   BILITOT 0.8 0.9  --   --  PROT 5.6* 5.4*  --   --   ALBUMIN 3.1* 3.0* 2.7* 2.9*    No results for input(s): LIPASE, AMYLASE in the last 168 hours. No results for input(s): AMMONIA in the last 168 hours. CBC: Recent Labs  Lab 09/22/21 0215 09/23/21 0253 09/24/21 0234 09/25/21 0106  WBC 5.8 5.7 5.4 5.4  HGB 11.2* 10.3* 10.1* 10.0*  HCT 33.0* 30.5* 29.6* 28.6*  MCV 104.1* 104.1* 103.1* 102.9*  PLT 117* 109* 119* 125*    Cardiac Enzymes: No results for input(s): CKTOTAL, CKMB, CKMBINDEX, TROPONINI in the last 168 hours. CBG: No results for input(s): GLUCAP in the last 168 hours.  Iron Studies: No results for input(s): IRON, TIBC, TRANSFERRIN, FERRITIN in the last 72 hours. Studies/Results: DG CHEST PORT 1 VIEW  Result Date: 09/25/2021 CLINICAL DATA:  Shortness of breath EXAM: PORTABLE CHEST 1 VIEW COMPARISON:  Chest radiograph dated September 20, 2021 FINDINGS: The heart is normal in size. Atherosclerotic calcifications of the aortic arch. Hyperinflated  lungs concerning for COPD. No focal consolidation or appreciable pleural effusion. IMPRESSION: Hyperinflated lungs concerning for COPD without evidence of focal consolidation or pleural effusion. Electronically Signed   By: Keane Police D.O.   On: 09/25/2021 13:16   Medications: Infusions:   Scheduled Medications:  apixaban  2.5 mg Oral BID   atorvastatin  40 mg Oral QHS   feeding supplement  237 mL Oral BID BM   influenza vaccine adjuvanted  0.5 mL Intramuscular Tomorrow-1000   levothyroxine  100 mcg Oral Q0600   linaclotide  145 mcg Oral QAC breakfast   multivitamin with minerals  1 tablet Oral Daily   pantoprazole  40 mg Oral Daily   tamsulosin  0.4 mg Oral Daily   vitamin B-12  100 mcg Oral Daily    have reviewed scheduled and prn medications.  Physical Exam: General:  NAD-  sitting up in bed talking energetically  Heart: RRR Lungs: mostly clear Abdomen: soft, non tender Extremities: no peripheral edema     09/26/2021,10:41 AM  LOS: 7 days

## 2021-09-26 NOTE — Progress Notes (Signed)
Pt discharge. Went over instruction with pt and pts son. Pt was taken down in wheelchair.

## 2021-09-26 NOTE — TOC CM/SW Note (Signed)
HF TOC CM referral sent to [place] rep, Stacie to review for [facility]. Jonnie Finner RN3 CCM, Heart Failure TOC CM [phone]   [facility]-declined  [facility]-declined

## 2021-09-26 NOTE — Progress Notes (Signed)
Physical Therapy Treatment Patient Details Name: Albert Allen MRN: [id number] DOB: [birth year] Today's Date: 09/26/2021   History of Present Illness Pt is a 87 y.o. male admitted 09/19/21 with SOB. CTA with large bilateral pleural effusions. Workup also for CHF exacerbation. S/p L thoracentesis 10/22, R thoracentesis 10/24. PMH includes CAD, HTN, anemia, asthma, COPD.    PT Comments    Pt was seen for mobility in the room, with no AD needed and with cues for direction and safety.  Pt is instructed on pursed lip breathing to manage his O2, but his BP's in sitting and standing are not an issue for orthostatics.  Pt received instruction for safety of gait and education for pacing activity.  He is accompanied by son who is going to be available for support to return home successfully.   Follow for acute PT goals as written below.  Continue to keep an eye on BP's to ck for fall risk.  Recommendations for follow up therapy are one component of a multi-disciplinary discharge planning process, led by the attending physician.  Recommendations may be updated based on patient status, additional functional criteria and insurance authorization.  Follow Up Recommendations  Home health PT     Assistance Recommended at Discharge PRN  Equipment Recommendations  None recommended by PT    Recommendations for Other Services       Precautions / Restrictions Precautions Precautions: Fall Precaution Comments: watch BP Restrictions Weight Bearing Restrictions: No Other Position/Activity Restrictions: ck orthostatics     Mobility  Bed Mobility Overal bed mobility: Needs Assistance Bed Mobility: Supine to Sit           General bed mobility comments: HOB elevated, use of rail.    Transfers Overall transfer level: Modified independent   Transfers: Sit to/from Stand Sit to Stand: Min guard           General transfer comment: Min guard for safety, wide BoS.No dizziness.     Ambulation/Gait Ambulation/Gait assistance: Min guard Gait Distance (Feet): 70 Feet (35 x 2) Assistive device: None Gait Pattern/deviations: Step-through pattern;Decreased stride length;Narrow base of support Gait velocity: reduced Gait velocity interpretation: <1.31 ft/sec, indicative of household ambulator General Gait Details: walked with no AD and manages BOS without losing balance   Stairs             Wheelchair Mobility    Modified Rankin (Stroke Patients Only)       Balance Overall balance assessment: Needs assistance Sitting-balance support: Feet supported;No upper extremity supported Sitting balance-Leahy Scale: Good   Postural control: Posterior lean Standing balance support: During functional activity;No upper extremity supported Standing balance-Leahy Scale: Good Standing balance comment: cleared for orthostatics in sitting and standing                            Cognition Arousal/Alertness: Awake/alert Behavior During Therapy: WFL for tasks assessed/performed Overall Cognitive Status: Within Functional Limits for tasks assessed                                          Exercises      General Comments General comments (skin integrity, edema, etc.): pt was seen for control of standing balance, reaching out for surfaces but not using them in WB fashion      Pertinent Vitals/Pain Pain Assessment: No/denies pain    Home Living  Prior Function            PT Goals (current goals can now be found in the care plan section) Acute Rehab PT Goals Patient Stated Goal: get better and go home Progress towards PT goals: Progressing toward goals    Frequency    Min 3X/week      PT Plan Current plan remains appropriate    Co-evaluation              AM-PAC PT "6 Clicks" Mobility   Outcome Measure  Help needed turning from your back to your side while in a flat bed without  using bedrails?: None Help needed moving from lying on your back to sitting on the side of a flat bed without using bedrails?: None Help needed moving to and from a bed to a chair (including a wheelchair)?: None Help needed standing up from a chair using your arms (e.g., wheelchair or bedside chair)?: None Help needed to walk in hospital room?: A Little Help needed climbing 3-5 steps with a railing? : A Little 6 Click Score: 22    End of Session Equipment Utilized During Treatment: Gait belt Activity Tolerance: Patient tolerated treatment well Patient left: in chair;with call bell/phone within reach;with chair alarm set;with family/visitor present Nurse Communication: Mobility status PT Visit Diagnosis: Unsteadiness on feet (R26.81);Muscle weakness (generalized) (M62.81);Difficulty in walking, not elsewhere classified (R26.2)     Time: 3428-7681 PT Time Calculation (min) (ACUTE ONLY): 24 min  Charges:  $Gait Training: 8-22 mins $Therapeutic Activity: 8-22 mins              Ramond Dial 09/26/2021, 7:47 PM  Mee Hives, PT PhD Acute Rehab Dept. Number: [place] and [place]

## 2021-09-26 NOTE — Discharge Summary (Signed)
[facility]  Physician Discharge Summary   Patient ID: Albert Allen MRN: [id number] DOB/AGE: [id number] 85 y.o.  Admit date: 09/18/2021 Discharge date: 09/26/2021    PCP: Albert Fraise, MD  DISCHARGE DIAGNOSES:  Acute on chronic systolic and diastolic CHF, improved Bilateral pleural effusion status post thoracentesis Hyponatremia, stable Paroxysmal atrial flutter/fibrillation Hypothyroidism Macrocytic anemia Iron deficiency   RECOMMENDATIONS FOR OUTPATIENT FOLLOW UP: Patient to follow-up with PCP next week to check electrolytes especially sodium level   Home Health: PT and OT Equipment/Devices: None  CODE STATUS: DNR  DISCHARGE CONDITION: fair  Diet recommendation: As before  INITIAL HISTORY: Albert Allen is a 87 y.o. male with medical history significant of anemia, alcoholic polyneuropathy, COPD, hypertension, hypothyroidism,LBBB, gerd presented to ED with complaints of shortness of breath.  Found to have acute CHF exacerbation.  Will hospitalize for further management.  Echocardiogram showed EF to be 20 to 25%.  Cardiology was consulted.  Subsequently developed hyponatremia.  Nephrology was consulted  Consultations: Cardiology Nephrology Interventional radiology  Procedures: Echocardiogram Bilateral thoracentesis    HOSPITAL COURSE:   Acute on chronic systolic and diastolic heart failure Echocardiogram showed LVEF of 20 to 25%, with decreased function, The left ventricle demonstrates regional wall motion abnormalities. Left ventricular diastolic parameters are indeterminate. Elevated left ventricular end-diastolic pressure. Elevated troponins probably from demand ischemia from acute CHF. Patient was seen by cardiology.  Placed on IV diuretics.  Transition to oral diuretics.  Underwent thoracentesis as discussed below.  Feels much better this morning and wants to go home.  Saturating normal on room air.   Bilateral pleural  effusions Likely due to CHF.  Underwent bilateral thoracentesis.  Noted to be a transudative process.  Seems to be stable for the most part.  Chest x-ray done this morning does not show any significant pleural effusions.     Hyponatremia Likely due to a combination of CHF along with overdiuresis.  Sodium level had improved but seems to have plateaued.  Nephrology is satisfied with t current sodium level.  Will need to be monitored in the outpatient setting.   Paroxysmal atrial flutter/fibrillation Patient was started on IV heparin for anticoagulation.  Subsequently transitioned to Eliquis.  Currently in sinus rhythm.     Hypothyroidism TSH elevated at 8.  Free T4 normal.  Nephrology has increased the dose of levothyroxine.    COPD Stable.  Continue with duo nebs.   Anxiety Continue with Xanax.   Macrocytic anemia Anemia panel shows low ferritin and elevated TIBC.  Iron supplements at discharge.  Folic acid level was normal.  Vitamin B12 level was 539.   GERD  Stable on protonix.    BPH:  On flomax.    Mild thrombocytopenia:  No bleeding seen.  Stable.   Mild AKI Possibly from overdiuresis.  Appears to have resolved.    Overall patient remained stable.  Wants to go home.  Home health has been ordered.  Okay for discharge home today.    PERTINENT LABS:  The results of significant diagnostics from this hospitalization (including imaging, microbiology, ancillary and laboratory) are listed below for reference.    Microbiology: Recent Results (from the past 240 hour(s))  Resp Panel by RT-PCR (Flu A&B, Covid) Nasopharyngeal Swab     Status: None   Collection Time: 09/19/21  1:48 AM   Specimen: Nasopharyngeal Swab; Nasopharyngeal(NP) swabs in vial transport medium  Result Value Ref Range Status   SARS Coronavirus 2 by RT PCR NEGATIVE NEGATIVE Final  Comment: (NOTE) SARS-CoV-2 target nucleic acids are NOT DETECTED.  The SARS-CoV-2 RNA is generally detectable in upper  respiratory specimens during the acute phase of infection. The lowest concentration of SARS-CoV-2 viral copies this assay can detect is 138 copies/mL. A negative result does not preclude SARS-Cov-2 infection and should not be used as the sole basis for treatment or other patient management decisions. A negative result may occur with  improper specimen collection/handling, submission of specimen other than nasopharyngeal swab, presence of viral mutation(s) within the areas targeted by this assay, and inadequate number of viral copies(<138 copies/mL). A negative result must be combined with clinical observations, patient history, and epidemiological information. The expected result is Negative.  Fact Sheet for Patients:  EntrepreneurPulse.com.au  Fact Sheet for Healthcare Providers:  IncredibleEmployment.be  This test is no t yet approved or cleared by the [place] FDA and  has been authorized for detection and/or diagnosis of SARS-CoV-2 by FDA under an Emergency Use Authorization (EUA). This EUA will remain  in effect (meaning this test can be used) for the duration of the COVID-19 declaration under Section 564(b)(1) of the Act, 21 U.S.C.section 360bbb-3(b)(1), unless the authorization is terminated  or revoked sooner.       Influenza A by PCR NEGATIVE NEGATIVE Final   Influenza B by PCR NEGATIVE NEGATIVE Final    Comment: (NOTE) The Xpert Xpress SARS-CoV-2/FLU/RSV plus assay is intended as an aid in the diagnosis of influenza from Nasopharyngeal swab specimens and should not be used as a sole basis for treatment. Nasal washings and aspirates are unacceptable for Xpert Xpress SARS-CoV-2/FLU/RSV testing.  Fact Sheet for Patients: EntrepreneurPulse.com.au  Fact Sheet for Healthcare Providers: IncredibleEmployment.be  This test is not yet approved or cleared by the [place] FDA and has been  authorized for detection and/or diagnosis of SARS-CoV-2 by FDA under an Emergency Use Authorization (EUA). This EUA will remain in effect (meaning this test can be used) for the duration of the COVID-19 declaration under Section 564(b)(1) of the Act, 21 U.S.C. section 360bbb-3(b)(1), unless the authorization is terminated or revoked.  Performed at [facility], [address], [place], [place] [address]   Culture, body fluid w Gram Stain-bottle     Status: None   Collection Time: 09/20/21 11:46 AM   Specimen: Pleura  Result Value Ref Range Status   Specimen Description PLEURAL FLUID  Final   Special Requests LUNG LEFT LOWER LOBE  Final   Culture   Final    NO GROWTH 5 DAYS Performed at [facility], [address], [place], [place] [address]    Report Status 09/25/2021 FINAL  Final  Gram stain     Status: None   Collection Time: 09/20/21 11:46 AM   Specimen: Pleura  Result Value Ref Range Status   Specimen Description PLEURAL FLUID  Final   Special Requests LUNG LEFT LOWER LOBE  Final   Gram Stain   Final    WBC PRESENT,BOTH PMN AND MONONUCLEAR NO ORGANISMS SEEN CYTOSPIN SMEAR Performed at [facility], [address] [place], [place] [address]    Report Status 09/20/2021 FINAL  Final     Labs:  [id number]   Lab Results  Component Value Date   [place] NEGATIVE 09/19/2021      Basic Metabolic Panel: Recent Labs  Lab 09/22/21 0215 09/23/21 0253 09/24/21 0234 09/25/21 0106 09/26/21 0303  NA 128* 124* 127* 126* 126*  K 4.2 4.3 4.1 4.6 4.3  CL 93*  91* 93* 94* 92*  CO2 28 26 26 25 29   GLUCOSE 113* 104* 107* 112* 112*  BUN 25* 27* 27* 24* 26*  CREATININE 1.19 1.26* 1.04 0.96 1.24  CALCIUM 8.7* 8.3* 8.3* 8.5* 8.5*  PHOS  --   --   --  2.7 3.3   Liver Function Tests: Recent Labs  Lab 09/25/21 0106 09/26/21 0303  ALBUMIN 2.7* 2.9*    CBC: Recent Labs  Lab 09/22/21 0215 09/23/21 0253 09/24/21 0234  09/25/21 0106  WBC 5.8 5.7 5.4 5.4  HGB 11.2* 10.3* 10.1* 10.0*  HCT 33.0* 30.5* 29.6* 28.6*  MCV 104.1* 104.1* 103.1* 102.9*  PLT 117* 109* 119* 125*    BNP: BNP (last 3 results) Recent Labs    09/19/21 0001  BNP 1,425.5*    IMAGING STUDIES DG Chest 1 View  Result Date: 09/22/2021 CLINICAL DATA:  Status post right thoracentesis. EXAM: CHEST  1 VIEW COMPARISON:  09/20/2021 FINDINGS: Stable cardiomediastinal contours. Aortic atherosclerotic calcifications. Decreased volume of right pleural effusion status post thoracentesis. No pneumothorax visualized. Small left pleural effusion is identified. No airspace opacities or signs of interstitial edema. Degenerative changes noted within the right glenohumeral joint. IMPRESSION: 1. Decreased volume of right pleural effusion status post thoracentesis. 2. No pneumothorax. Electronically Signed   By: Kerby Moors M.D.   On: 09/22/2021 15:05   DG Chest 1 View  Result Date: 09/20/2021 CLINICAL DATA:  Thoracentesis post op EXAM: CHEST  1 VIEW COMPARISON:  09/18/2021 FINDINGS: No pneumothorax. Skin fold overlies the left lateral hemithorax. Residual small right pleural effusion. Patchy atelectasis/infiltrate at the right lung base. Heart size and mediastinal contours are within normal limits. Aortic Atherosclerosis (ICD10-170.0). Bilateral shoulder DJD IMPRESSION: No pneumothorax post thoracentesis. Electronically Signed   By: Lucrezia Europe M.D.   On: 09/20/2021 11:21   DG Chest 2 View  Result Date: 09/18/2021 CLINICAL DATA:  Chest pain shortness of breath EXAM: CHEST - 2 VIEW COMPARISON:  12/09/2017 FINDINGS: Small left greater than right pleural effusions. Basilar airspace disease. Normal cardiac size. Aortic atherosclerosis. No pneumothorax IMPRESSION: Small left greater than right pleural effusions with basilar atelectasis or pneumonia Electronically Signed   By: Donavan Foil M.D.   On: 09/18/2021 21:17   CT Angio Chest PE W and/or Wo  Contrast  Result Date: 09/19/2021 CLINICAL DATA:  Shortness of breath for 1 week EXAM: CT ANGIOGRAPHY CHEST WITH CONTRAST TECHNIQUE: Multidetector CT imaging of the chest was performed using the standard protocol during bolus administration of intravenous contrast. Multiplanar CT image reconstructions and MIPs were obtained to evaluate the vascular anatomy. CONTRAST:  67mL OMNIPAQUE IOHEXOL 350 MG/ML SOLN COMPARISON:  Chest x-ray from the previous day. FINDINGS: Cardiovascular: Atherosclerotic calcifications of the aorta are noted without significant aneurysmal dilatation. The degree of opacification of the aorta is limited precluding evaluation for dissection. The pulmonary artery shows a normal branching pattern without intraluminal filling defect to suggest pulmonary embolism. Coronary calcifications seen. Mediastinum/Nodes: Thoracic inlet is within normal limits. No sizable hilar or mediastinal adenopathy is noted. The esophagus as visualized is within normal limits. Lungs/Pleura: Large bilateral pleural effusions are noted left slightly greater than right. Some basilar atelectasis is noted bilaterally. Emphysematous changes are noted. No focal confluent infiltrate is seen. No pneumothorax is noted. Upper Abdomen: Visualized upper abdomen is unremarkable. Musculoskeletal: Degenerative changes of the thoracic spine are noted. No acute rib abnormality is seen. Review of the MIP images confirms the above findings. IMPRESSION: No evidence of pulmonary emboli. Large bilateral pleural  effusions with only minimal atelectasis in the bases. Aortic Atherosclerosis (ICD10-I70.0) and Emphysema (ICD10-J43.9). Electronically Signed   By: Inez Catalina M.D.   On: 09/19/2021 00:44   US Abdomen Complete  Result Date: 09/20/2021 CLINICAL DATA:  87 year old male history of abdominal pain. EXAM: ABDOMEN ULTRASOUND COMPLETE COMPARISON:  CT abdomen pelvis from 09/08/2018 FINDINGS: Gallbladder: No gallstones or wall thickening  visualized. No sonographic Murphy sign noted by sonographer. Common bile duct: Diameter: 5 mm Liver: Ill-defined hyperechoic subcapsular mass visualized in the right dome of the liver compatible with previously visualized angioma. No new suspicious hepatic lesions. Within normal limits in parenchymal echogenicity. Portal vein is patent on color Doppler imaging with normal direction of blood flow towards the liver. IVC: No abnormality visualized. Pancreas: Not well visualized. Spleen: Size and appearance within normal limits. Right Kidney: Length: 9.8 cm. Echogenicity within normal limits. No mass or hydronephrosis visualized. Left Kidney: Length: 9.3 cm. Echogenicity within normal limits. No mass or hydronephrosis visualized. Abdominal aorta: Not well visualized. Other findings: None. IMPRESSION: No acute abnormality in the visualized abdomen. Ruthann Cancer, MD [facility] Electronically Signed   By: Ruthann Cancer M.D.   On: 09/20/2021 12:55   DG CHEST PORT 1 VIEW  Result Date: 09/25/2021 CLINICAL DATA:  Shortness of breath EXAM: PORTABLE CHEST 1 VIEW COMPARISON:  Chest radiograph dated September 20, 2021 FINDINGS: The heart is normal in size. Atherosclerotic calcifications of the aortic arch. Hyperinflated lungs concerning for COPD. No focal consolidation or appreciable pleural effusion. IMPRESSION: Hyperinflated lungs concerning for COPD without evidence of focal consolidation or pleural effusion. Electronically Signed   By: Keane Police D.O.   On: 09/25/2021 13:16   ECHOCARDIOGRAM COMPLETE  Result Date: 09/20/2021    ECHOCARDIOGRAM REPORT   Patient Name:   ASKIA HAZELIP Date of Exam: 09/20/2021 Medical Rec #:  [id number]         Height:       67.0 in Accession #:    [id number]        Weight:       124.8 lb Date of Birth:  26-Apr-1923         BSA:          1.655 m Patient Age:    37 years          BP:           139/76 mmHg Patient Gender: M                  HR:           64 bpm. Exam Location:  Inpatient Procedure: 2D Echo, 3D Echo, Color Doppler, Cardiac Doppler and Intracardiac            Opacification Agent Indications:    I50.1 Left ventricular failure; I50.40* Unspecified combined                 systolic (congestive) and diastolic (congestive) heart failure  History:        Patient has prior history of Echocardiogram examinations, most                 recent 03/18/2016. CAD, Abnormal ECG, Arrythmias:LBBB; Risk                 Factors:Hypertension and Dyslipidemia. ETOH.  Sonographer:    Roseanna Rainbow RDCS Referring Phys: Theola Sequin IMPRESSIONS  1. Left ventricular ejection fraction, by estimation, is 20 to 25%. The left ventricle has  severely decreased function. The left ventricle demonstrates regional wall motion abnormalities (see scoring diagram/findings for description). Left ventricular diastolic parameters are indeterminate. Elevated left ventricular end-diastolic pressure.  2. Right ventricular systolic function is normal. The right ventricular size is normal. There is normal pulmonary artery systolic pressure.  3. The mitral valve is grossly normal. Mild to moderate mitral valve regurgitation. No evidence of mitral stenosis.  4. The aortic valve was not well visualized. There is moderate calcification of the aortic valve. Aortic valve regurgitation is trivial. Mild to moderate aortic valve sclerosis/calcification is present, without any evidence of aortic stenosis.  5. The inferior vena cava is normal in size with greater than 50% respiratory variability, suggesting right atrial pressure of 3 mmHg. Comparison(s): Changes from prior study are noted. Conclusion(s)/Recommendation(s): Severely reduced LVEF with akinesis mid to apical, anterior/septal more than inferior/lateral. Findings discussed with cardiology team seeing him in consultation. FINDINGS  Left Ventricle: Lv thrombus excluded by contrast. Left ventricular ejection fraction, by  estimation, is 20 to 25%. The left ventricle has severely decreased function. The left ventricle demonstrates regional wall motion abnormalities. Definity contrast agent was given IV to delineate the left ventricular endocardial borders. The left ventricular internal cavity size was normal in size. There is no left ventricular hypertrophy. Abnormal (paradoxical) septal motion, consistent with left bundle branch block. Left ventricular diastolic parameters are indeterminate. Elevated left ventricular end-diastolic pressure.  LV Wall Scoring: The mid and distal anterior wall, mid and distal anterior septum, entire apex, and mid inferoseptal segment are akinetic. The antero-lateral wall, inferior wall, posterior wall, basal anteroseptal segment, basal anterior segment, and basal inferoseptal segment are hypokinetic. Right Ventricle: The right ventricular size is normal. No increase in right ventricular wall thickness. Right ventricular systolic function is normal. There is normal pulmonary artery systolic pressure. The tricuspid regurgitant velocity is 2.59 m/s, and  with an assumed right atrial pressure of 3 mmHg, the estimated right ventricular systolic pressure is 47.0 mmHg. Left Atrium: Left atrial size was normal in size. Right Atrium: Right atrial size was normal in size. Pericardium: There is no evidence of pericardial effusion. Mitral Valve: The mitral valve is grossly normal. There is mild calcification of the mitral valve leaflet(s). Mild mitral annular calcification. Mild to moderate mitral valve regurgitation. No evidence of mitral valve stenosis. MV peak gradient, 5.8 mmHg. The mean mitral valve gradient is 3.0 mmHg. Tricuspid Valve: The tricuspid valve is normal in structure. Tricuspid valve regurgitation is trivial. No evidence of tricuspid stenosis. Aortic Valve: The aortic valve was not well visualized. There is moderate calcification of the aortic valve. Aortic valve regurgitation is trivial. Aortic  regurgitation PHT measures 793 msec. Mild to moderate aortic valve sclerosis/calcification is present, without any evidence of aortic stenosis. Pulmonic Valve: The pulmonic valve was not well visualized. Pulmonic valve regurgitation is trivial. No evidence of pulmonic stenosis. Aorta: The aortic root, ascending aorta and aortic arch are all structurally normal, with no evidence of dilitation or obstruction. Venous: The inferior vena cava is normal in size with greater than 50% respiratory variability, suggesting right atrial pressure of 3 mmHg. IAS/Shunts: The atrial septum is grossly normal.  LEFT VENTRICLE PLAX 2D LVIDd:         4.60 cm     Diastology LVIDs:         4.10 cm     LV e' medial:    9.30 cm/s LV PW:         0.80 cm     LV  E/e' medial:  10.9 LV IVS:        1.00 cm     LV e' lateral:   6.45 cm/s LVOT diam:     1.95 cm     LV E/e' lateral: 15.7 LV SV:         55 LV SV Index:   33 LVOT Area:     2.99 cm                             3D Volume EF: LV Volumes (MOD)           3D EF:        22 % LV vol d, MOD A2C: 96.1 ml LV EDV:       155 ml LV vol d, MOD A4C: 91.6 ml LV ESV:       120 ml LV vol s, MOD A2C: 74.8 ml LV SV:        34 ml LV vol s, MOD A4C: 77.8 ml LV SV MOD A2C:     21.3 ml LV SV MOD A4C:     91.6 ml LV SV MOD BP:      21.4 ml RIGHT VENTRICLE            IVC RV S prime:     8.78 cm/s  IVC diam: 1.20 cm TAPSE (M-mode): 1.6 cm LEFT ATRIUM           Index        RIGHT ATRIUM           Index LA diam:      3.10 cm 1.87 cm/m   RA Area:     12.40 cm LA Vol (A2C): 26.7 ml 16.13 ml/m  RA Volume:   28.30 ml  17.10 ml/m LA Vol (A4C): 29.5 ml 17.83 ml/m  AORTIC VALVE LVOT Vmax:   99.30 cm/s LVOT Vmean:  62.800 cm/s LVOT VTI:    0.185 m AI PHT:      793 msec  AORTA Ao Root diam: 3.70 cm Ao Asc diam:  2.90 cm MITRAL VALVE                TRICUSPID VALVE MV Area (PHT): 5.38 cm     TR Peak grad:   26.8 mmHg MV Area VTI:   2.24 cm     TR Vmax:        259.00 cm/s MV Peak grad:  5.8 mmHg MV Mean grad:  3.0  mmHg     SHUNTS MV Vmax:       1.20 m/s     Systemic VTI:  0.18 m MV Vmean:      80.8 cm/s    Systemic Diam: 1.95 cm MV Decel Time: 141 msec MV E velocity: 101.00 cm/s Buford Dresser MD Electronically signed by Buford Dresser MD Signature Date/Time: 09/20/2021/11:47:11 AM    Final    IR THORACENTESIS ASP PLEURAL SPACE W/IMG GUIDE  Result Date: 09/22/2021 INDICATION: Pleural Effusion EXAM: ULTRASOUND GUIDED RIGHT THORACENTESIS MEDICATIONS: None. COMPLICATIONS: None immediate. PROCEDURE: An ultrasound guided thoracentesis was thoroughly discussed with the patient and questions answered. The benefits, risks, alternatives and complications were also discussed. The patient understands and wishes to proceed with the procedure. Written consent was obtained. Ultrasound was performed to localize and mark an adequate pocket of fluid in the RIGHT chest. The area was then prepped and draped in the normal sterile fashion. 1% Lidocaine was used for local  anesthesia. Under ultrasound guidance a 6 Fr Safe-T-Centesis catheter was introduced. Thoracentesis was performed. The catheter was removed and a dressing applied. FINDINGS: A total of approximately 542ml of amber fluid was removed. IMPRESSION: Successful ultrasound guided right thoracentesis yielding 550cc amber of pleural fluid. Electronically Signed   By: Miachel Roux M.D.   On: 09/22/2021 16:56   US THORACENTESIS ASP PLEURAL SPACE W/IMG GUIDE  Result Date: 09/20/2021 INDICATION: Congestive heart failure with fluid overload and bilateral pleural effusions. Request for diagnostic and therapeutic thoracentesis. EXAM: ULTRASOUND GUIDED LEFT THORACENTESIS MEDICATIONS: 1% lidocaine 10 mL COMPLICATIONS: None immediate. PROCEDURE: An ultrasound guided thoracentesis was thoroughly discussed with the patient and questions answered. The benefits, risks, alternatives and complications were also discussed. The patient understands and wishes to proceed with the  procedure. Written consent was obtained. Ultrasound was performed to localize and mark an adequate pocket of fluid in the left chest. The area was then prepped and draped in the normal sterile fashion. 1% Lidocaine was used for local anesthesia. Under ultrasound guidance a 6 Fr Safe-T-Centesis catheter was introduced. Thoracentesis was performed. The catheter was removed and a dressing applied. FINDINGS: A total of approximately 800 mL of clear yellow fluid was removed. Samples were sent to the laboratory as requested by the clinical team. IMPRESSION: Successful ultrasound guided left thoracentesis yielding 800 mL of pleural fluid. No pneumothorax on post-procedure chest x-ray. Read by: Gareth Eagle, PA-C Electronically Signed   By: Ruthann Cancer M.D.   On: 09/20/2021 11:02    DISCHARGE EXAMINATION: Vitals:   09/25/21 2048 09/26/21 0429 09/26/21 0500 09/26/21 1357  BP: (!) 101/52 (!) 102/54  (!) 109/54  Pulse: 79 75  89  Resp: 19 18  16   Temp: (!) 97.5 F (36.4 C) 98.2 F (36.8 C)  98 F (36.7 C)  TempSrc: Oral Oral  Oral  SpO2: 96% 97%  100%  Weight:   52.3 kg   Height:       General appearance: Awake alert.  In no distress Resp: Improved aeration bilaterally.  Normal effort.  No wheezing or rhonchi. Cardio: S1-S2 is normal regular.  No S3-S4.  No rubs murmurs or bruit GI: Abdomen is soft.  Nontender nondistended.  Bowel sounds are present normal.  No masses organomegaly    DISPOSITION: Home with son  Discharge Instructions     (HEART FAILURE PATIENTS) Call MD:  Anytime you have any of the following symptoms: 1) 3 pound weight gain in 24 hours or 5 pounds in 1 week 2) shortness of breath, with or without a dry hacking cough 3) swelling in the hands, feet or stomach 4) if you have to sleep on extra pillows at night in order to breathe.   Complete by: As directed    Call MD for:  difficulty breathing, headache or visual disturbances   Complete by: As directed    Call MD for:  extreme  fatigue   Complete by: As directed    Call MD for:  persistant dizziness or light-headedness   Complete by: As directed    Call MD for:  persistant nausea and vomiting   Complete by: As directed    Call MD for:  severe uncontrolled pain   Complete by: As directed    Call MD for:  temperature >100.4   Complete by: As directed    Diet - low sodium heart healthy   Complete by: As directed    Discharge instructions   Complete by: As directed  Please be sure to follow-up with your primary care provider next week to have your blood work done to check your sodium levels.  Take your medications as prescribed.  Now that you are on a blood thinner please watch for signs of bleeding including black-colored stool, blood in stool, blood in urine.  You were cared for by a hospitalist during your hospital stay. If you have any questions about your discharge medications or the care you received while you were in the hospital after you are discharged, you can call the unit and asked to speak with the hospitalist on call if the hospitalist that took care of you is not available. Once you are discharged, your primary care physician will handle any further medical issues. Please note that NO REFILLS for any discharge medications will be authorized once you are discharged, as it is imperative that you return to your primary care physician (or establish a relationship with a primary care physician if you do not have one) for your aftercare needs so that they can reassess your need for medications and monitor your lab values. If you do not have a primary care physician, you can call [phone] for a physician referral.   Increase activity slowly   Complete by: As directed          Allergies as of 09/26/2021       Reactions   Amoxicillin Swelling, Other (See Comments)   Pt has swelling with PCN's   Penicillins Swelling   Lips swelling Has patient had a PCN reaction causing immediate rash,  facial/tongue/throat swelling, SOB or lightheadedness with hypotension: No Has patient had a PCN reaction causing severe rash involving mucus membranes or skin necrosis: No Has patient had a PCN reaction that required hospitalization No Has patient had a PCN reaction occurring within the last 10 years: No If all of the above answers are "NO", then may proceed with Cephalosporin use.   Ceclor [cefaclor] Other (See Comments)   Pt does not remember reaction   Celebrex [celecoxib] Other (See Comments)   Pt does not remember reaction   Levaquin [levofloxacin In D5w] Other (See Comments)   Pt does not remember reaction        Medication List     STOP taking these medications    aspirin EC 81 MG tablet   diclofenac 75 MG EC tablet Commonly known as: VOLTAREN   famotidine 40 MG tablet Commonly known as: Pepcid   olmesartan 20 MG tablet Commonly known as: BENICAR       TAKE these medications    ALPRAZolam 0.5 MG tablet Commonly known as: XANAX Take 1 tablet (0.5 mg total) by mouth at bedtime. Only on Mondays and Thursdays What changed:  when to take this reasons to take this additional instructions   atorvastatin 40 MG tablet Commonly known as: LIPITOR Take 1 tablet (40 mg total) by mouth at bedtime.   Eliquis 2.5 MG Tabs tablet Generic drug: apixaban Take 1 tablet (2.5 mg total) by mouth 2 (two) times daily.   apixaban 2.5 MG Tabs tablet Commonly known as: ELIQUIS Take 1 tablet (2.5 mg total) by mouth 2 (two) times daily. Start taking on: October 27, 2021   ferrous sulfate 325 (65 FE) MG tablet Take 1 tablet (325 mg total) by mouth daily.   furosemide 20 MG tablet Commonly known as: LASIX Take 1 tablet (20 mg total) by mouth daily.   hydroxypropyl methylcellulose / hypromellose 2.5 % ophthalmic solution Commonly known as: ISOPTO  TEARS / GONIOVISC Place 1 drop into both eyes daily.   levothyroxine 100 MCG tablet Commonly known as: SYNTHROID Take 1 tablet  (100 mcg total) by mouth daily at 6 (six) AM. What changed:  medication strength how much to take when to take this additional instructions   Linzess 72 MCG capsule Generic drug: linaclotide TAKE 1 CAPSULE DAILY BEFORE BREAKFAST   magnesium hydroxide 400 MG/5ML suspension Commonly known as: MILK OF MAGNESIA Take 15 mLs by mouth daily as needed for mild constipation.   pantoprazole 40 MG tablet Commonly known as: PROTONIX TAKE 1 TABLET BY MOUTH  DAILY   polyethylene glycol 17 g packet Commonly known as: MIRALAX / GLYCOLAX Take 17 g by mouth daily.   PreserVision AREDS Caps Take 1 capsule by mouth daily.   ProAir RespiClick 643 (90 Base) MCG/ACT Aepb Generic drug: Albuterol Sulfate Inhale 2 puffs into the lungs 4 (four) times daily as needed (shortness of breath). What changed: Another medication with the same name was changed. Make sure you understand how and when to take each.   albuterol 108 (90 Base) MCG/ACT inhaler Commonly known as: VENTOLIN HFA INHALE 2 PUFFS EVERY 6 HOURS AS NEEDED FOR WHEEZING OR SHORTNESS OF BREATH What changed: See the new instructions.   silodosin 4 MG Caps capsule Commonly known as: RAPAFLO TAKE 1 CAPSULE ONCE A DAY What changed: See the new instructions.   VITAMIN B-12 PO Take 1 tablet by mouth daily.          Follow-up Information     Albert Fraise, MD Follow up in 1 week(s).   Specialty: Family Medicine Why: to check sodium levels next week Contact information: [place] [address] [phone]         [facility] Follow up.   Specialty: Home Health Services Why: [place] will call to arrange appt Contact information: [place] [address] [phone]         [facility] Follow up.   Specialty: Hospice and Palliative Medicine Why: will call to arrange Palliative Follow Up visit Contact information: [place] [address] [phone]                 TOTAL DISCHARGE TIME: 2 minutes  [place]  [facility] Pager on [email or web address]  09/27/2021, 11:39 AM

## 2021-09-26 NOTE — Progress Notes (Signed)
Mobility Specialist Progress Note:   09/26/21 1421  Mobility  Activity Ambulated in room  Level of Assistance Standby assist, set-up cues, supervision of patient - no hands on  Assistive Device None  Distance Ambulated (ft) 140 ft  Mobility Ambulated with assistance in room  Mobility Response Tolerated well  Mobility performed by Mobility specialist  $Mobility charge 1 Mobility  SATURATION QUALIFICATIONS: (This note is used to comply with regulatory documentation for home oxygen)  Patient Saturations on [place] at Rest = 100%  Patient Saturations on [place] while Ambulating = 95%  Patient Saturations on 0 Liters of oxygen while Ambulating = 95%-97%  Pt received in bed willing to participate in mobility. No complaints of pain and asymptomatic. Pt returned to bed with call bell in reach and all needs met.   [facility] Health and safety inspector Phone [phone]

## 2021-09-26 NOTE — Consult Note (Signed)
Consultation Note Date: 09/26/2021   Patient Name: Albert Allen  DOB: [birth year]  MRN: [id number]  Age / Sex: 87 y.o., male  PCP: [name], MD Referring Physician: Bonnielee Haff, MD  Reason for Consultation: Establishing goals of care  HPI/Patient Profile: 87 y.o. male  with past medical history of anemia, alcoholic polyneuropathy, COPD, hypertension, hypothyroidism,LBBB, gerd, CHF admitted on 09/18/2021 with acute CHF exacerbation.  PMT has been consulted to assist with goals of care conversation.   Clinical Assessment and Goals of Care:  I have reviewed medical records including EPIC notes, labs and imaging, assessed the patient and then met at the bedside along with patient's son Albert Allen to discuss diagnosis prognosis, [place], EOL wishes, disposition and options.  I introduced Palliative Medicine as specialized medical care for people living with serious illness. It focuses on providing relief from the symptoms and stress of a serious illness. The goal is to improve quality of life for both the patient and the family.  We discussed a brief life review of the patient and then focused on their current illness. The natural disease trajectory and expectations at EOL were discussed. Ericson shares that he was married for 8 years until losing his wife 3 years ago. 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 He has two sons, Albert Allen and Albert Allen, and many friends he enjoys spending time with. Gid is living at home alone and reports great support from his sons whenever he needs it. He is still cleaning, cooking, and bathing on his own. He ambulates independently without assistive device. He reports a good appetite overall, although recently decreasing some. 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 He has a good understanding of his overall illness and notes that "it  would be alright if I laid down to sleep and died," as he has lived a long and fulfilling life. He has also prayed for God's forgiveness and is at peace with the unpredictability of the future and his expected prognosis.    I attempted to elicit values and goals of care important to the patient.   He strongly values his time at home and wants to continue with current interventions with the goal of remaining independent as long as possible. He would never want interventions such as artificial nutrition, mechanical ventilation, dialysis, or other aggressive life-prolonging measures if it becomes evident that he is approaching end of life rapidly.  The difference between aggressive medical intervention and comfort care was considered in light of the patient's goals of care.   Advanced directives, concepts specific to code status, artifical feeding and hydration, and rehospitalization were considered and discussed.  Hospice and Palliative Care services outpatient were explained and offered.  Discussed the importance of continued conversation with family and the medical providers regarding overall plan of care and treatment options, ensuring decisions are within the context of the patient's values and GOCs.    Questions and concerns were addressed.  Hard Choices booklet left for  review. The family was encouraged to call with questions or concerns.  PMT will continue to support holistically.    PATIENT is the primary medical decision-maker. Son reports there is a Oceanographer. Per son Elmo Putt may be son Albert Allen.      SUMMARY OF RECOMMENDATIONS   -DNR -Continue current care; patient's goal is to remain independent at home for as long as possible -Patient is agreeable to outpatient palliative care referral for ongoing goc discussion including further review of hospice if he declines -Psychosocial and emotional support provided -PMT is available acutely on an as needed basis. Please secure chat  with urgent needs.  Code Status/Advance Care Planning: DNR  Additional Recommendations (Limitations, Scope, Preferences): No Artificial Feeding, No Hemodialysis, and No Tracheostomy  Psycho-social/Spiritual:  Desire for further Chaplaincy support:Did not assess Additional Recommendations: Referral to Community Resources   Prognosis:  Unable to determine  Discharge Planning: Home with Palliative Services      Primary Diagnoses: Present on Admission:  Acute combined systolic and diastolic heart failure (HCC)  Hypothyroidism  Hyperlipidemia  Essential hypertension  GERD  ANEMIA, B12 DEFICIENCY  Alcoholic polyneuropathy ([place])   I have reviewed the medical record, interviewed the patient and family, and examined the patient. The following aspects are pertinent.  Past Medical History:  Diagnosis Date   ABNORMAL CV (STRESS) TEST 03/05/2009   ABUSE, ALCOHOL, IN REMISSION 05/31/2007   Adhesive capsulitis of shoulder 9/98/3382   Alcoholic polyneuropathy ([place]) 03/29/2008   ANEMIA, B12 DEFICIENCY 03/29/2008   Anxiety state, unspecified 11/19/2008   ASTHMA 05/31/2007   Asthma    CAD, NATIVE VESSEL 04/04/2009   CARPAL TUNNEL SYNDROME, RIGHT 09/05/2007   Cataract    removed bilaterally   Chronic prostatitis 03/29/2008   COPD (chronic obstructive pulmonary disease) ([place])    "they told me I have a touch of COPD"   Crohn's disease ([place])    Degenerative disc disease, cervical    DEGENERATIVE DISC DISEASE, CERVICAL SPINE 06/22/2007   Diverticulosis    DYSPHAGIA UNSPECIFIED 09/25/2008   Esophageal stricture    External hemorrhoids    GERD 05/31/2007   GOUT 05/31/2007   Hepatomegaly 09/25/2008   Hiatal hernia    HIP PAIN 03/20/2010   History of echocardiogram    Echo 4/17 - mild LVH, EF 50-55%, Gr 1 DD, trivial AI, MAC, mild MR   HYPERLIPIDEMIA 03/27/2009   HYPERTENSION 05/31/2007   HYPOTHYROIDISM 05/31/2007   LEFT BUNDLE BRANCH BLOCK 02/11/2009   Liver hemangioma    Liver mass, left lobe     LOC OSTEOARTHROS NOT SPEC PRIM/SEC LOWER LEG 03/20/2010   OSTEOARTHRITIS 05/31/2007   PERSONAL HX COLONIC POLYPS 03/05/1997   adenomatous    PSA, INCREASED 03/20/2010   SINUSITIS, CHRONIC NOS 05/31/2007   Sore throat    CURRENTLY   SYNCOPE 11/19/2008   WEIGHT LOSS, RECENT 11/19/2008   Social History   Socioeconomic History   Marital status: Married    Spouse name: Not on file   Number of children: 2   Years of education: 9   Highest education level: 9th grade  Occupational History   Occupation: retired    Comment: Oncologist  Tobacco Use   Smoking status: Former    Types: Cigarettes    Quit date: 04/24/1998    Years since quitting: 23.4   Smokeless tobacco: Never  Vaping Use   Vaping Use: Never used  Substance and Sexual Activity   Alcohol use: Yes    Alcohol/week: 5.0 standard drinks  Types: 5 Shots of liquor per week    Comment: Does not drink weekly but does buy a pint of liqour occasionally and will drink most of the bottle when he does   Drug use: No   Sexual activity: Not Currently  Other Topics Concern   Not on file  Social History Narrative   Not on file   Social Determinants of Health   Financial Resource Strain: Not on file  Food Insecurity: Not on file  Transportation Needs: Not on file  Physical Activity: Not on file  Stress: Not on file  Social Connections: Not on file   Family History  Problem Relation Age of Onset   Cancer Mother        In back but unsure of what type of cancer   Alcohol abuse Father    Lung disease Father        abscess   Diabetes Sister    Cancer Brother        throat   Diabetes Brother    Diabetes Sister    Colon cancer Neg Hx    Colon polyps Neg Hx    Esophageal cancer Neg Hx    Rectal cancer Neg Hx    Stomach cancer Neg Hx    Scheduled Meds:  apixaban  2.5 mg Oral BID   atorvastatin  40 mg Oral QHS   feeding supplement  237 mL Oral BID BM   furosemide  20 mg Oral Daily   influenza vaccine adjuvanted  0.5 mL  Intramuscular Tomorrow-1000   levothyroxine  100 mcg Oral Q0600   linaclotide  145 mcg Oral QAC breakfast   multivitamin with minerals  1 tablet Oral Daily   pantoprazole  40 mg Oral Daily   tamsulosin  0.4 mg Oral Daily   vitamin B-12  100 mcg Oral Daily   Continuous Infusions: PRN Meds:.acetaminophen, albuterol, ALPRAZolam, ipratropium-albuterol, lidocaine (PF), ondansetron (ZOFRAN) IV Medications Prior to Admission:  Prior to Admission medications   Medication Sig Start Date End Date Taking? Authorizing Provider  albuterol (VENTOLIN HFA) 108 (90 Base) MCG/ACT inhaler INHALE 2 PUFFS EVERY 6 HOURS AS NEEDED FOR WHEEZING OR SHORTNESS OF BREATH Patient taking differently: Inhale 2 puffs into the lungs every 6 (six) hours as needed for wheezing or shortness of breath. 06/16/21  Yes Stacks, Cletus Gash, MD  ALPRAZolam Duanne Moron) 0.5 MG tablet Take 1 tablet (0.5 mg total) by mouth at bedtime. Only on Mondays and Thursdays Patient taking differently: Take 0.5 mg by mouth daily as needed for anxiety. 08/19/21  Yes [name], MD  apixaban (ELIQUIS) 2.5 MG TABS tablet Take 1 tablet (2.5 mg total) by mouth 2 (two) times daily. 10/27/21 01/25/22 Yes Bonnielee Haff, MD  aspirin EC 81 MG tablet Take 81 mg by mouth daily.   Yes [provider]  Cyanocobalamin (VITAMIN B-12 PO) Take 1 tablet by mouth daily.   Yes [provider]  diclofenac (VOLTAREN) 75 MG EC tablet TAKE 1 TABLET BY MOUTH  TWICE DAILY AS NEEDED FOR  JOINT OR MUSCLE PAIN Patient taking differently: Take 75 mg by mouth daily as needed for mild pain. 05/07/21  Yes [name], MD  ferrous sulfate 325 (65 FE) MG tablet Take 1 tablet (325 mg total) by mouth daily. 09/26/21 09/26/22 Yes Bonnielee Haff, MD  hydroxypropyl methylcellulose / hypromellose (ISOPTO TEARS / GONIOVISC) 2.5 % ophthalmic solution Place 1 drop into both eyes daily.   Yes [provider]  levothyroxine (SYNTHROID) 88 MCG tablet Take 1  tablet (88 mcg  total) by mouth daily before breakfast. On an empty stomach 06/19/21  Yes Stacks, Cletus Gash, MD  magnesium hydroxide (MILK OF MAGNESIA) 400 MG/5ML suspension Take 15 mLs by mouth daily as needed for mild constipation.   Yes [provider]  Multiple Vitamins-Minerals (PRESERVISION AREDS) CAPS Take 1 capsule by mouth daily.   Yes [provider]  pantoprazole (PROTONIX) 40 MG tablet TAKE 1 TABLET BY MOUTH  DAILY Patient taking differently: Take 40 mg by mouth daily. 07/31/21  Yes Pyrtle, Lajuan Lines, MD  silodosin (RAPAFLO) 4 MG CAPS capsule TAKE 1 CAPSULE ONCE A DAY Patient taking differently: Take 4 mg by mouth daily. 07/17/21  Yes [name], MD  Albuterol Sulfate (PROAIR RESPICLICK) 706 (90 Base) MCG/ACT AEPB Inhale 2 puffs into the lungs 4 (four) times daily as needed (shortness of breath). Patient not taking: No sig reported 11/05/20   [name], MD  apixaban (ELIQUIS) 2.5 MG TABS tablet Take 1 tablet (2.5 mg total) by mouth 2 (two) times daily. 09/26/21 10/26/21  Bonnielee Haff, MD  atorvastatin (LIPITOR) 40 MG tablet Take 1 tablet (40 mg total) by mouth at bedtime. 09/26/21 11/25/21  Bonnielee Haff, MD  famotidine (PEPCID) 40 MG tablet Take 1 tablet (40 mg total) by mouth at bedtime. Patient not taking: No sig reported 06/19/21   [name], MD  furosemide (LASIX) 20 MG tablet Take 1 tablet (20 mg total) by mouth daily. 09/27/21 12/26/21  Bonnielee Haff, MD  levothyroxine (SYNTHROID) 100 MCG tablet Take 1 tablet (100 mcg total) by mouth daily at 6 (six) AM. 09/27/21 12/26/21  Bonnielee Haff, MD  LINZESS 72 MCG capsule TAKE 1 CAPSULE DAILY BEFORE BREAKFAST Patient not taking: No sig reported 06/18/21   Pyrtle, Lajuan Lines, MD  olmesartan (BENICAR) 20 MG tablet Take 0.5 tablets (10 mg total) by mouth daily. Patient not taking: No sig reported 04/25/21   Loman Brooklyn, FNP   Allergies  Allergen Reactions   Amoxicillin Swelling and Other (See Comments)    Pt has swelling with  PCN's   Penicillins Swelling    Lips swelling Has patient had a PCN reaction causing immediate rash, facial/tongue/throat swelling, SOB or lightheadedness with hypotension: No Has patient had a PCN reaction causing severe rash involving mucus membranes or skin necrosis: No Has patient had a PCN reaction that required hospitalization No Has patient had a PCN reaction occurring within the last 10 years: No If all of the above answers are "NO", then may proceed with Cephalosporin use.   Ceclor [Cefaclor] Other (See Comments)    Pt does not remember reaction   Celebrex [Celecoxib] Other (See Comments)    Pt does not remember reaction   Levaquin [Levofloxacin In D5w] Other (See Comments)    Pt does not remember reaction   Review of Systems  All other systems reviewed and are negative.  Physical Exam Vitals and nursing note reviewed.  Constitutional:      General: He is not in acute distress. Cardiovascular:     Rate and Rhythm: Normal rate.  Pulmonary:     Effort: Pulmonary effort is normal.  Neurological:     Mental Status: He is alert and oriented to person, place, and time.  Psychiatric:        Mood and Affect: Mood normal.    Vital Signs: BP (!) 102/54 (BP Location: Right Arm)   Pulse 75   Temp 98.2 F (36.8 C) (Oral)   Resp 18   Ht 5'  7" (1.702 m)   Wt 52.3 kg   SpO2 97%   BMI 18.04 kg/m  Pain Scale: 0-10   Pain Score: 0-No pain   SpO2: SpO2: 97 % O2 Device:SpO2: 97 % O2 Flow Rate: .   IO: Intake/output summary:  Intake/Output Summary (Last 24 hours) at 09/26/2021 1320 Last data filed at 09/26/2021 0715 Gross per 24 hour  Intake --  Output 1720 ml  Net -1720 ml    LBM: Last BM Date: 09/24/21 Baseline Weight: Weight: 56.4 kg Most recent weight: Weight: 52.3 kg     Palliative Assessment/Data: 70%     Time In: 12:10pm Time Out: 1:00pm Time Total: 50 minutes Greater than 50% of this time was spent in counseling and coordinating care related to the  above assessment and plan.  Dorthy Cooler, PA-C Palliative Medicine Team Team phone # [phone]  Thank you for allowing the Palliative Medicine Team to assist in the care of this patient. Please utilize secure chat with additional questions, if there is no response within 30 minutes please call the above phone number.  Palliative Medicine Team providers are available by phone from 7am to 7pm daily and can be reached through the team cell phone.  Should this patient require assistance outside of these hours, please call the patient's attending physician.

## 2021-09-26 NOTE — TOC Initial Note (Signed)
Transition of Care [facility]) - Initial/Assessment Note    Patient Details  Name: Albert Allen MRN: [id number] Date of Birth: 04-Jan-1923  Transition of Care [facility]) CM/SW Contact:    Erenest Rasher, RN Phone Number: [phone] 09/26/2021, 2:18 PM  Clinical Narrative:                  HF TOC CM spoke to son, Geraldo Docker to provide information on HH, [facility] has accepted referral. Offered choice for Palliative Services. Son agreeable to [facility] or [facility]. Contacted [facility] rep, to discuss referral. Left message for return call. Son had concerns that he may transition back to his home in [place].  Contacted [facility]. Left message for return call.     Expected Discharge Plan: [place] Barriers to Discharge: No Barriers Identified   Patient Goals and CMS Choice Patient states their goals for this hospitalization and ongoing recovery are:: remain independent CMS Medicare.gov Compare Post Acute Care list provided to:: Patient Represenative (must comment) (son-Brantley Powe) Choice offered to / list presented to : Adult Children  Expected Discharge Plan and Services Expected Discharge Plan: [place] In-house Referral: Clinical Social Work Discharge Planning Services: CM Consult Post Acute Care Choice: [place] arrangements for the past 2 months: [place] Expected Discharge Date: 09/26/21                         HH Arranged: PT [place]: [place] Date [place]: 09/26/21 Time [place]: [place] Representative spoke with at [place]: Freddi Starr  Prior Living Arrangements/Services Living arrangements for the past 2 months: [place] with:: Self Patient language and need for interpreter reviewed:: Yes Do you feel safe going back to the place where you live?: Yes      Need for Family Participation in  Patient Care: Yes (Comment) Care giver support system in place?: Yes (comment) Current home services: Home PT, Home RN Criminal Activity/Legal Involvement Pertinent to Current Situation/Hospitalization: No - Comment as needed  Activities of Daily Living Home Assistive Devices/Equipment: None ADL Screening (condition at time of admission) Patient's cognitive ability adequate to safely complete daily activities?: Yes Is the patient deaf or have difficulty hearing?: Yes Does the patient have difficulty seeing, even when wearing glasses/contacts?: No Does the patient have difficulty concentrating, remembering, or making decisions?: No Patient able to express need for assistance with ADLs?: Yes Does the patient have difficulty dressing or bathing?: No Independently performs ADLs?: Yes (appropriate for developmental age) Does the patient have difficulty walking or climbing stairs?: Yes Weakness of Legs: None Weakness of Arms/Hands: None  Permission Sought/Granted Permission sought to share information with : Case Manager, PCP, Family Supports Permission granted to share information with : Yes, Verbal Permission Granted  Share Information with NAME: Tayt Moyers  Permission granted to share info w AGENCY: Home Health, DME  Permission granted to share info w Relationship: son  Permission granted to share info w Contact Information: [phone]  Emotional Assessment   Attitude/Demeanor/Rapport: Gracious Affect (typically observed): Accepting Orientation: : Oriented to Self, Oriented to Place, Oriented to  Time, Oriented to Situation   Psych Involvement: No (comment)  Admission diagnosis:  Acute on chronic diastolic CHF (congestive heart failure) (HCC) [I50.33] Acute congestive heart failure, unspecified heart failure type [facility]) [I50.9] Patient Active Problem List   Diagnosis Date Noted  Goals of care, counseling/discussion    Pleural effusion on left    Acute combined systolic  and diastolic heart failure ([place]) 09/19/2021   Insomnia 08/04/2016   Midline low back pain without sciatica 04/20/2016   Right inguinal hernia 12/18/2015   Seasonal allergic rhinitis 04/17/2013   BPH (benign prostatic hyperplasia) 04/17/2013   Diverticulosis of large intestine 06/15/2011   Hernia, hiatal 06/15/2011   Diverticulum of duodenum 06/15/2011   PERSONAL HX COLONIC POLYPS 03/25/2010   PSA, INCREASED 03/20/2010   CAD, NATIVE VESSEL 04/04/2009   Hyperlipidemia 03/27/2009   LEFT BUNDLE BRANCH BLOCK 02/11/2009   HEPATOMEGALY 09/25/2008   ANEMIA, B12 DEFICIENCY 90/30/0923   Alcoholic polyneuropathy ([place]) 03/29/2008   Hypothyroidism 05/31/2007   ABUSE, ALCOHOL, IN REMISSION 05/31/2007   Essential hypertension 05/31/2007   Asthma 05/31/2007   GERD 05/31/2007   Osteoarthritis 05/31/2007   PCP:  [name], MD Pharmacy:   [place] [place] [address] [place] [address] Phone: [phone] Fax: [place], [address] [place] [address] Phone: [phone] Fax: [phone]  [facility] [address] [place] [address] Phone: [phone] Fax: [phone]     Social Determinants of Health (SDOH) Interventions    Readmission Risk Interventions No flowsheet data found.

## 2021-09-29 ENCOUNTER — Telehealth: Payer: Self-pay

## 2021-09-29 NOTE — Telephone Encounter (Signed)
Transition Care Management Unsuccessful Follow-up Telephone Call  Date of discharge and from [facility] 09[facility]  Diagnosis: acute CHF exacerbation   Attempts:  1st Attempt  Reason for unsuccessful TCM follow-up call:  No answer/busy   Transition Care Management Unsuccessful Follow-up Telephone Call  Date of discharge and from where:  09/26/21 - [place]  Diagnosis: acute CHF exacerbation   Attempts:  2nd Attempt  Reason for unsuccessful TCM follow-up call:  Left voice message

## 2021-10-02 NOTE — Telephone Encounter (Signed)
Transition Care Management Follow-up Telephone Call Date of discharge and from where: [facility] 09[facility] Diagnosis: CHF exacerbation How have you been since you were released from the hospital? Doing okay Any questions or concerns? No  Items Reviewed: Did the pt receive and understand the discharge instructions provided? Yes  Medications obtained and verified? Yes  Other? No  Any new allergies since your discharge? No  Dietary orders reviewed? Yes Do you have support at home? Yes   Home Care and Equipment/Supplies: Were home health services ordered? yes If so, what is the name of the agency? [facility]  Has the agency set up a time to come to the patient's home? yes Were any new equipment or medical supplies ordered?  No What is the name of the medical supply agency? N/a Were you able to get the supplies/equipment? not applicable Do you have any questions related to the use of the equipment or supplies? No  Functional Questionnaire: (I = Independent and D = Dependent) ADLs: I  Bathing/Dressing- I  Meal Prep- D  Eating- I  Maintaining continence- I  Transferring/Ambulation- I  Managing Meds- I  Follow up appointments reviewed:  PCP Hospital f/u appt confirmed? Yes  Scheduled to see [place] on 10/15/21 @ 9. [place] Hospital f/u appt confirmed? No   Are transportation arrangements needed? No  If their condition worsens, is the pt aware to call PCP or go to the Emergency Dept.? Yes Was the patient provided with contact information for the PCP's office or ED? Yes Was to pt encouraged to call back with questions or concerns? Yes

## 2021-10-03 ENCOUNTER — Telehealth: Payer: Self-pay | Admitting: Family Medicine

## 2021-10-03 DIAGNOSIS — G5601 Carpal tunnel syndrome, right upper limb: Secondary | ICD-10-CM | POA: Diagnosis not present

## 2021-10-03 DIAGNOSIS — J449 Chronic obstructive pulmonary disease, unspecified: Secondary | ICD-10-CM | POA: Diagnosis not present

## 2021-10-03 DIAGNOSIS — Z7901 Long term (current) use of anticoagulants: Secondary | ICD-10-CM | POA: Diagnosis not present

## 2021-10-03 DIAGNOSIS — M1990 Unspecified osteoarthritis, unspecified site: Secondary | ICD-10-CM | POA: Diagnosis not present

## 2021-10-03 DIAGNOSIS — I5033 Acute on chronic diastolic (congestive) heart failure: Secondary | ICD-10-CM | POA: Diagnosis not present

## 2021-10-03 DIAGNOSIS — M5030 Other cervical disc degeneration, unspecified cervical region: Secondary | ICD-10-CM | POA: Diagnosis not present

## 2021-10-03 DIAGNOSIS — M109 Gout, unspecified: Secondary | ICD-10-CM | POA: Diagnosis not present

## 2021-10-03 DIAGNOSIS — I5021 Acute systolic (congestive) heart failure: Secondary | ICD-10-CM | POA: Diagnosis not present

## 2021-10-03 DIAGNOSIS — Z87891 Personal history of nicotine dependence: Secondary | ICD-10-CM | POA: Diagnosis not present

## 2021-10-03 DIAGNOSIS — D518 Other vitamin B12 deficiency anemias: Secondary | ICD-10-CM | POA: Diagnosis not present

## 2021-10-03 DIAGNOSIS — K219 Gastro-esophageal reflux disease without esophagitis: Secondary | ICD-10-CM | POA: Diagnosis not present

## 2021-10-03 DIAGNOSIS — K5750 Diverticulosis of both small and large intestine without perforation or abscess without bleeding: Secondary | ICD-10-CM | POA: Diagnosis not present

## 2021-10-03 DIAGNOSIS — I110 Hypertensive heart disease with heart failure: Secondary | ICD-10-CM | POA: Diagnosis not present

## 2021-10-03 DIAGNOSIS — E785 Hyperlipidemia, unspecified: Secondary | ICD-10-CM | POA: Diagnosis not present

## 2021-10-03 DIAGNOSIS — I2510 Atherosclerotic heart disease of native coronary artery without angina pectoris: Secondary | ICD-10-CM | POA: Diagnosis not present

## 2021-10-03 DIAGNOSIS — D539 Nutritional anemia, unspecified: Secondary | ICD-10-CM | POA: Diagnosis not present

## 2021-10-03 DIAGNOSIS — M7500 Adhesive capsulitis of unspecified shoulder: Secondary | ICD-10-CM | POA: Diagnosis not present

## 2021-10-03 DIAGNOSIS — G4700 Insomnia, unspecified: Secondary | ICD-10-CM | POA: Diagnosis not present

## 2021-10-03 DIAGNOSIS — E039 Hypothyroidism, unspecified: Secondary | ICD-10-CM | POA: Diagnosis not present

## 2021-10-03 DIAGNOSIS — I447 Left bundle-branch block, unspecified: Secondary | ICD-10-CM | POA: Diagnosis not present

## 2021-10-03 NOTE — Telephone Encounter (Signed)
Spoke with Marzetta Board and she states she just wanted to make sure that PCP aggred to starting palliative care. Aware you are out of the office today

## 2021-10-03 NOTE — Telephone Encounter (Signed)
I don't understand what this message is asking me for. I see where the hospital wanted home health. He has a hospital follow-up scheduled.

## 2021-10-06 NOTE — Telephone Encounter (Signed)
Okay 

## 2021-10-07 ENCOUNTER — Telehealth (HOSPITAL_COMMUNITY): Payer: Self-pay | Admitting: Pharmacist

## 2021-10-07 ENCOUNTER — Telehealth: Payer: Self-pay

## 2021-10-07 ENCOUNTER — Other Ambulatory Visit (HOSPITAL_COMMUNITY): Payer: Self-pay

## 2021-10-07 NOTE — Telephone Encounter (Signed)
Stacy aware

## 2021-10-07 NOTE — Telephone Encounter (Signed)
Pharmacy Transitions of Care Follow-up Telephone Call  Spoke with patient who was very pleasant but has some difficulty hearing.  Upon speaking louder he was able to understand me clearly.  He did request that I call his son who may understand/hear better.  His son, Albert Allen, was pleasant and well informed about his medications.    Date of discharge: 09/26/21  Discharge Diagnosis: HF with a.fib/flutter  How have you been since you were released from the hospital?  Pt seemed positive but reported that overall he doesn't feel all that well.  He is experiencing some chest discomfort (possibly related her hernia, following up with gastro this week), he lacks appetite and says he feels unwell sometimes.    Medication changes made at discharge:  - START:  atorvastatin (LIPITOR)  Eliquis (apixaban)  apixaban [place])  Start taking on: October 27, 2021 ferrous sulfate  furosemide (LASIX)  polyethylene glycol (MIRALAX / GLYCOLAX)   - STOPPED:  aspirin EC 81 MG tablet  diclofenac 75 MG EC tablet (VOLTAREN)  famotidine 40 MG tablet (Pepcid)  olmesartan 20 MG tablet (BENICAR)   - CHANGED:  levothyroxine (SYNTHROID)   Medication changes verified by the patient? Yes    Medication Accessibility:  [facility] Pharmacy: [facility]   Was the patient provided with refills on discharged medications? no   Have all prescriptions been transferred from [facility] to home pharmacy? New rx already sent to home pharmacy   Is the patient able to afford medications? PartD plan Notable copays: Eliquis, copay affordable for now Eligible patient assistance: MAP possible    Medication Review:  APIXABAN (ELIQUIS)  Apixaban 2.5 mg BID initiated on 09/26/21  - Discussed importance of taking medication around the same time everyday  - Reviewed potential DDIs with patient  - Advised patient of medications to avoid (NSAIDs, ASA)  - Educated that Tylenol (acetaminophen) will be the preferred  analgesic to prevent risk of bleeding  - Emphasized importance of monitoring for signs and symptoms of bleeding (abnormal bruising, prolonged bleeding, nose bleeds, bleeding from gums, discolored urine, black tarry stools)  - Advised patient to alert all providers of anticoagulation therapy prior to starting a new medication or having a procedure    Follow-up Appointments:  PCP Hospital f/u appt confirmed? Scheduled to see Albert Allen, PCP on 10/15/21 @ 9:10.   [facility] f/u appt confirmed? Scheduled to see Albert Barrios, NP, cardiology on 12/03/20 @ 10:15.   Specialist - Gastroenterology - with Carl Best,  NP on 10/09/21 at 11:00  If their condition worsens, is the pt aware to call PCP or go to the Emergency Dept.? yes  Final Patient Assessment:  Patient was positive but has several complaints.  Most importantly he describes a productive cough starting a couple days ago.  He feels that something "breaks loose" and on a couple occassions he has coughed up mucus/phlegm that appears to be blood colored or have a blood clot appearance.  He describes having ludens cherry cough drops often but says he does not think the red color is from the drop.  I have notifed cardiology NP, Albert Allen.  I have also recommended that the patient seek care at the ER if these episodes continue or worsen.

## 2021-10-07 NOTE — Telephone Encounter (Signed)
(  4:58 pm) SW scheduled initial palliative care visit with patient's son-Brandt. Laruth Bouchard requested that visit be put off a couple of weeks as patient is being evaluated for physical therapy tomorrow. RN/SW scheduled to visit patient at his home on 10/20/21 @ 11:30 am.

## 2021-10-08 ENCOUNTER — Other Ambulatory Visit (HOSPITAL_COMMUNITY): Payer: Self-pay

## 2021-10-08 ENCOUNTER — Telehealth: Payer: Self-pay | Admitting: *Deleted

## 2021-10-08 NOTE — Telephone Encounter (Signed)
The patient states that occasionally (not every day) he notes some blood tinged sputum, it is bright red and up to a quarter size at most.   He has his heat on and I asked about possibly his mucus membranes being dry.  Asked him not to stop or skip any doses and to let us know if this worsens.  He has an appointment w GI tomorrow for a hernia and may need endoscopy.  I adv GI will talk w him about if he will need to hold Elqiuis.

## 2021-10-08 NOTE — Telephone Encounter (Signed)
-----   Message from Burnell Blanks, MD sent at 10/08/2021  9:56 AM EST ----- Regarding: RE: Eliquis bleed risk Zemirah Krasinski, Can you check on him? I would not stop his Eliquis if he is just having occasional blood tinged sputum. If this becomes worse, may have to consider stopping it. Albert Allen  ----- Message ----- From: Albert Allen, [place] Sent: 10/07/2021   5:53 PM EST To: Imogene Burn, PA-C, # Subject: Eliquis bleed risk                             Dr. [name] and Selinda Eon, Albert Allen has an office visit scheduled with you in January.  However, I have spoken with him (as well as his  Son, Albert Allen, who is caretaker) today as part of our Transitions of Care pharmacy program.  We reviewed his recent discharge meds, specifically Eliquis.  He is doing ok since discharge but describes that over the past couple of days he has experienced a few episodes of productive cough, where he felt something "break loose" and when he spit the mucus/phlegm was red and appeared to have a blood clot appearance.  He did say he routinely has ludens cherry cough drops on hand but that this did not look like a cherry red color but rather a blood color/appearance.  He and his son did not know who or if they should notify anyone, so I am reaching out to you for them.  Albert Allen also says he skipped a dose of his eliquis this morning due to the possible bleeding.  He and his son were pleasant to speak with and seem to be doing a good job of managing his medications.  Please contact either one with questions about the possible bleeding.  Albert Allen can be reached on his home phone.  His son, Albert Allen, can be reached at the mobile number listed in epic.  Thank you!!

## 2021-10-09 ENCOUNTER — Encounter: Payer: Self-pay | Admitting: Nurse Practitioner

## 2021-10-09 ENCOUNTER — Other Ambulatory Visit (INDEPENDENT_AMBULATORY_CARE_PROVIDER_SITE_OTHER): Payer: Medicare Other

## 2021-10-09 ENCOUNTER — Ambulatory Visit: Payer: Medicare Other | Admitting: Nurse Practitioner

## 2021-10-09 VITALS — BP 102/40 | HR 106 | Ht 67.0 in | Wt 126.6 lb

## 2021-10-09 DIAGNOSIS — R110 Nausea: Secondary | ICD-10-CM | POA: Diagnosis not present

## 2021-10-09 DIAGNOSIS — D509 Iron deficiency anemia, unspecified: Secondary | ICD-10-CM

## 2021-10-09 DIAGNOSIS — R079 Chest pain, unspecified: Secondary | ICD-10-CM | POA: Diagnosis not present

## 2021-10-09 LAB — BASIC METABOLIC PANEL
BUN: 32 mg/dL — ABNORMAL HIGH (ref 6–23)
CO2: 31 mEq/L (ref 19–32)
Calcium: 8.8 mg/dL (ref 8.4–10.5)
Chloride: 94 mEq/L — ABNORMAL LOW (ref 96–112)
Creatinine, Ser: 1.22 mg/dL (ref 0.40–1.50)
GFR: 50.06 mL/min — ABNORMAL LOW (ref >60.00)
Glucose, Bld: 91 mg/dL (ref 70–99)
Potassium: 4.1 mEq/L (ref 3.5–5.1)
Sodium: 132 mEq/L — ABNORMAL LOW (ref 135–145)

## 2021-10-09 LAB — CBC
HCT: 30.6 % — ABNORMAL LOW (ref 39.0–52.0)
Hemoglobin: 10.4 g/dL — ABNORMAL LOW (ref 13.0–17.0)
MCHC: 33.9 g/dL (ref 30.0–36.0)
MCV: 105.7 fl — ABNORMAL HIGH (ref 78.0–100.0)
Platelets: 121 10*3/uL — ABNORMAL LOW (ref 150.0–400.0)
RBC: 2.9 Mil/uL — ABNORMAL LOW (ref 4.22–5.81)
RDW: 14.9 % (ref 11.5–15.5)
WBC: 6.2 10*3/uL (ref 4.0–10.5)

## 2021-10-09 MED ORDER — FAMOTIDINE 20 MG PO TABS: 20.0000 mg | ORAL_TABLET | Freq: Every day | ORAL | 1 refills | Status: AC

## 2021-10-09 NOTE — Progress Notes (Signed)
10/09/2021 Albert Allen [id number] [birth year]   Chief Complaint:  Nausea, chest tightness   History of Present Illness: Albert Allen is a 87 y.o. male with medical history of anemia, alcohol use disorder, polyneuropathy, hypertension, left BBB, CHF, COPD, hypothyroidism, GERD, Zenker's and duodenal diverticulum, a large hiatal hernia, chronic constipation and diverticulosis.Marland Kitchen  He is followed by Dr. Hilarie Fredrickson.  He was admitted to the hospital 10/20 - 09/26/2021 secondary to acute CHF exacerbation.  Troponin levels were elevated and were thought to be due to demand ischemia.  An echo showed LVEF 20 to 25%.  Chest CTA showed large bilateral pleural effusions without evidence of a PE.  S/P bilateral thoracenteses 10/22 and 09/22/2021 (pleural cultures were negative and cytology was negative for malignancy).  His hospital course was complicated by the development of paroxysmal atrial fibrillation for which he was started on IV heparin and then subsequently was transitioned to Eliquis.  He also developed hyponatremia and he was seen by nephrology who thought his hyponatremia was most likely due to heart failure and unlikely SIADH.  Laboratory studies 09/25/2021: Sodium 126.  Potassium 4.6.  BUN 24.  Creatinine 0.96.  WBC 5.4.  Hemoglobin 10.0.  Hematocrit 28.6.  MCV 102.9.  Platelet 125.  Laboratory studies 09/26/2021: Sodium 126.  BUN 26.  Creatinine 1.24.  He presents to our office today with complaints of nausea without vomiting and chest tightness which has persisted almost daily for the past year.  He describes feeling tight in his chest, cannot get a full breath in which occurs sometimes after eating or drinking water and can occur randomly as well.  He is concerned his hiatal hernia be getting worse.  Since he was discharged from the hospital 09/26/2021, he reported coughing up a quarter sized or smaller amount of darker red blood on 5 occasions.  He denies vomiting up any blood.  He  denies having any shortness of breath or chest pain at this time.  No heartburn.  He ate BBQ last night and felt as if the food was stuck in his esophagus for an hour or so.  He remains on Pantoprazole 40 mg daily.  He is drinking fluids and eating solid foods today without any difficulty.  He underwent an EGD 03/27/2020 which showed grade B reflux esophagitis, a 5 cm hiatal hernia, acute gastritis and a duodenal diverticulum.  Biopsies were negative for H. pylori or malignancy.  An abdominal sonogram 09/20/2021 showed a normal gallbladder.  He is passing a normal formed brown bowel movement daily.  No rectal bleeding or black stools.  He remains on Linzess 5mcg po QD.   Chest CTA 09/19/2021: No evidence of pulmonary emboli.  Large bilateral pleural effusions with only minimal atelectasis in the bases.  Aortic Atherosclerosis and Emphysema   Abdominal sonogram 09/20/2021: Gallbladder: No gallstones or wall thickening visualized. No sonographic Murphy sign noted by sonographer.   Common bile duct: Diameter: 5 mm   Liver: Ill-defined hyperechoic subcapsular mass visualized in the right dome of the liver compatible with previously visualized angioma. No new suspicious hepatic lesions. Within normal limits in parenchymal echogenicity. Portal vein is patent on color Doppler imaging with normal direction of blood flow towards the liver.   IVC: No abnormality visualized.   Pancreas: Not well visualized.   Spleen: Size and appearance within normal limits.   Right Kidney: Length: 9.8 cm. Echogenicity within normal limits. No mass or hydronephrosis visualized.   Left Kidney: Length: 9.3 cm. Echogenicity within  normal limits. No mass or hydronephrosis visualized.   Abdominal aorta: Not well visualized.   IMPRESSION: No acute abnormality in the visualized abdomen.   EGD 03/27/2020: - LA Grade B reflux esophagitis with no bleeding. - 5 cm hiatal hernia. - Acute gastritis. Biopsied. -  Duodenal diverticulum. - CHRONIC ACTIVE GASTRITIS WITH ULCERATION. - THERE IS NO EVIDENCE OF HELICOBACTER PYLORI, DYSPLASIA, OR MALIGNANCY  ECHO 09/20/2021: Left ventricular ejection fraction, by estimation, is 20 to 25%. The left ventricle has severely decreased function. The left ventricle demonstrates regional wall motion abnormalities (see scoring diagram/findings for description). Left ventricular diastolic parameters are indeterminate. Elevated left ventricular end-diastolic pressure. 1. Right ventricular systolic function is normal. The right ventricular size is normal. There is normal pulmonary artery systolic pressure. 2. The mitral valve is grossly normal. Mild to moderate mitral valve regurgitation. No evidence of mitral stenosis. 3. The aortic valve was not well visualized. There is moderate calcification of the aortic valve. Aortic valve regurgitation is trivial. Mild to moderate aortic valve sclerosis/calcification is present, without any evidence of aortic stenosis. 4. The inferior vena cava is normal in size with greater than 50% respiratory variability, suggesting right atrial pressure of 3 mmHg.  Current Outpatient Medications on File Prior to Visit  Medication Sig Dispense Refill   albuterol (VENTOLIN HFA) 108 (90 Base) MCG/ACT inhaler INHALE 2 PUFFS EVERY 6 HOURS AS NEEDED FOR WHEEZING OR SHORTNESS OF BREATH (Patient taking differently: Inhale 2 puffs into the lungs every 6 (six) hours as needed for wheezing or shortness of breath.) 8.5 g 5   Albuterol Sulfate (PROAIR RESPICLICK) 676 (90 Base) MCG/ACT AEPB Inhale 2 puffs into the lungs 4 (four) times daily as needed (shortness of breath). 1 each 11   ALPRAZolam (XANAX) 0.5 MG tablet Take 1 tablet (0.5 mg total) by mouth at bedtime. Only on Mondays and Thursdays (Patient taking differently: Take 0.5 mg by mouth daily as needed for anxiety.) 30 tablet 2   [START ON 10/27/2021] apixaban (ELIQUIS) 2.5 MG TABS tablet Take 1  tablet (2.5 mg total) by mouth 2 (two) times daily. 60 tablet 2   atorvastatin (LIPITOR) 40 MG tablet Take 1 tablet (40 mg total) by mouth at bedtime. 30 tablet 1   Cyanocobalamin (VITAMIN B-12 PO) Take 1 tablet by mouth daily.     ferrous sulfate 325 (65 FE) MG tablet Take 1 tablet (325 mg total) by mouth daily. 30 tablet 3   furosemide (LASIX) 20 MG tablet Take 1 tablet (20 mg total) by mouth daily. 30 tablet 2   hydroxypropyl methylcellulose / hypromellose (ISOPTO TEARS / GONIOVISC) 2.5 % ophthalmic solution Place 1 drop into both eyes daily.     levothyroxine (SYNTHROID) 100 MCG tablet Take 1 tablet (100 mcg total) by mouth daily at 6 (six) AM. 30 tablet 2   LINZESS 72 MCG capsule TAKE 1 CAPSULE DAILY BEFORE BREAKFAST 90 capsule 0   magnesium hydroxide (MILK OF MAGNESIA) 400 MG/5ML suspension Take 15 mLs by mouth daily as needed for mild constipation.     Multiple Vitamins-Minerals (PRESERVISION AREDS) CAPS Take 1 capsule by mouth daily.     pantoprazole (PROTONIX) 40 MG tablet TAKE 1 TABLET BY MOUTH  DAILY (Patient taking differently: Take 40 mg by mouth daily.) 90 tablet 0   polyethylene glycol (MIRALAX / GLYCOLAX) 17 g packet Take 17 g by mouth daily. 14 each 0   silodosin (RAPAFLO) 4 MG CAPS capsule TAKE 1 CAPSULE ONCE A DAY (Patient taking differently: Take 4 mg  by mouth daily.) 30 capsule 1   No current facility-administered medications on file prior to visit.   Allergies  Allergen Reactions   Amoxicillin Swelling and Other (See Comments)    Pt has swelling with PCN's   Penicillins Swelling    Lips swelling Has patient had a PCN reaction causing immediate rash, facial/tongue/throat swelling, SOB or lightheadedness with hypotension: No Has patient had a PCN reaction causing severe rash involving mucus membranes or skin necrosis: No Has patient had a PCN reaction that required hospitalization No Has patient had a PCN reaction occurring within the last 10 years: No If all of the  above answers are "NO", then may proceed with Cephalosporin use.   Ceclor [Cefaclor] Other (See Comments)    Pt does not remember reaction   Celebrex [Celecoxib] Other (See Comments)    Pt does not remember reaction   Levaquin [Levofloxacin In D5w] Other (See Comments)    Pt does not remember reaction   Current Medications, Allergies, Past Medical History, Past Surgical History, Family History and Social History were reviewed in [employer] record.  Review of Systems:   Constitutional: Negative for fever, sweats, chills or weight loss.  Respiratory: See HPI. Cardiovascular: See HPI. Gastrointestinal: See HPI.  Musculoskeletal: Negative for back pain or muscle aches.  Neurological: Negative for dizziness, headaches or paresthesias.   Physical Exam: BP (!) 102/40   Pulse (!) 106   Ht 5\' 7"  (1.702 m)   Wt 126 lb 9.6 oz (57.4 kg)   SpO2 97%   BMI 19.83 kg/m   Wt Readings from Last 3 Encounters:  10/09/21 126 lb 9.6 oz (57.4 kg)  09/26/21 115 lb 3.2 oz (52.3 kg)  06/19/21 124 lb 6.4 oz (56.4 kg)    General: thin 87 year old male HOH in NAD.  Head: Normocephalic and atraumatic. Eyes: No scleral icterus. Conjunctiva pink . Ears: Normal auditory acuity. Mouth: No ulcers or lesions.  Lungs: Diminished breath sounds throughout.  Heart: Regular rate and rhythm, no murmur. Abdomen: Soft, nontender and nondistended. No masses or hepatomegaly. Normal bowel sounds x 4 quadrants.  Rectal: Deferred.  Musculoskeletal: Symmetrical with no gross deformities. Extremities: No edema. Neurological: Alert oriented x 4. No focal deficits.  Psychological: Alert and cooperative. Normal mood and affect  Assessment and Recommendations:  74) 87 year old male with a history of GERD with complaints of nausea without vomiting. Abd sono 10/22 showed a normal gallbladder. EGD 02/2020 showed reflux, gastritis and a 5cm hiatal hernia.  -UGI series  -Continue Pantoprazole 40mg  Q AM,  add Famotidine 20mg  QHS  -Heart healthy diet as tolerated. -Further recommendation to be determined after upper GI series complete  2) Large hiatal hernia -See plan in # 1 -Patient is not a surgical candidate secondary to age and multiple comorbidities  3) Dysphagia, possible esophageal dysmotility  -EGD deferred as the benefits do not outweigh the risks due to multiple comorbidities  -Upper GI series with barium swallow study as ordered above  4) History of CAD with chest pain and SOB in setting of recent hospitalization for acute CHF, new onset atrial fibrillation, bilateral pleural effusion s/p left thoracentesis 10/22 (removal of 88ml) and right thoracentesis 10/24 (removal of 72ml) and atrial fib.  LV EF 20 -25%. On Eliquis with reported hemoptysis post hospital discharge. CTA 09/19/2021 during his hospital admission was negative for PE.  -Patient/son to contact PCP and cardiologist today for further evaluation/follow up  6) Chronic IDA + macrocytosis. No overt  GI bleeding. Hg 11.2 - 10. Iron 25. Ferritin 19.  -CBC -Continue ferrous sulfate 325 mg 1 p.o. daily -Patient to contact office if he spits up or vomits red blood, if he passes any black stools or red blood from the rectum   7) Thrombocytopenia. Abd sono 09/20/2021 without evidence of cirrhosis or splenomegaly   8) Renal insufficiency  -BMP  9) Hyponatremia  -BMP  Patient was instructed to go to the local emergency room if he develops worsening chest tightness, shortness of breath or hemoptysis

## 2021-10-09 NOTE — Progress Notes (Signed)
RADIOLOGY SCHEDULING REQUEST FOR [facility] Scheduling via secure staff message.

## 2021-10-09 NOTE — Patient Instructions (Addendum)
If you are age 85 or older, your body mass index should be between 23-30. Your Body mass index is 19.83 kg/m. If this is out of the aforementioned range listed, please consider follow up with your Primary Care Provider.  IMAGING: You will be contacted by [facility] (Your caller ID will indicate phone # [phone]) in the next 7 days to schedule your Upper GI series. If you have not heard from them within 7 business days, please call [facility] at [phone] to follow up on the status of your appointment.    MEDICATION: We have sent the following medication to your pharmacy for you to pick up at your convenience: Famotidine 20 MG tablet, take once a day at night.  LABS:  Lab work has been ordered for you today. Our lab is located in the basement. Press "B" on the elevator. The lab is located at the first door on the left as you exit the elevator.  RECOMMENDATIONS: Continue Pantoprazole 40 mg once a day. Contact your primary care provider and cardiologist today regarding chest tightness, shortness of breath, low blood pressure and episodes of coughing up blood.   It was great seeing you today! Thank you for entrusting me with your care and choosing [facility].  Noralyn Pick, CRNP

## 2021-10-13 ENCOUNTER — Other Ambulatory Visit: Payer: Self-pay | Admitting: Family Medicine

## 2021-10-13 ENCOUNTER — Telehealth: Payer: Self-pay | Admitting: Nurse Practitioner

## 2021-10-13 DIAGNOSIS — E039 Hypothyroidism, unspecified: Secondary | ICD-10-CM

## 2021-10-13 DIAGNOSIS — J206 Acute bronchitis due to rhinovirus: Secondary | ICD-10-CM

## 2021-10-13 NOTE — Progress Notes (Signed)
Addendum: Reviewed and agree with assessment and management plan. Madylyn Insco M, MD  

## 2021-10-13 NOTE — Progress Notes (Signed)
Pyrtle, Lajuan Lines, MD  Noralyn Pick, NP Jaclyn Shaggy  Thanks for the follow-up and I agree with your recommendations and plans.  JMP

## 2021-10-13 NOTE — Telephone Encounter (Signed)
I called the Mr. Sikora' son [name] for an update.Refer to office visit 10/09/2021. [name] stated his father called him Sat 10/11/2021 around 6pm somewhat frantic with c/o SOB. [name] went to his father's house shortly after and his father was anxious, somewhat SOB and he declined to go to the ED. He was able to eat dinner and he calmed down and his breathing eased. No further coughing up blood. No significant nausea. [name] reported his father is not taking all of his meds, skipped Eliquis for a few days. Father stated he just didn't want to take his meds. Palliative care home consult scheduled tomorrow, RN will also be present. He does not want to move in with his son but this will be readdressed tomorrow, possibly will have nurse home visits on a regular basis. He has a follow up appointment with Dr. Livia Snellen on Wed 11/16 to address his hyponatremia (NA+ improving 132) and he is needing Xanax refill. Patient to present to the ED if he develops worsening SOB or if chest tightness or hemoptysis recurs. Patient is scheduled for an UGI series in 2 weeks to evaluate his nausea and hiatal hernia, [name] will let me know if his father does not want to have done.

## 2021-10-14 ENCOUNTER — Other Ambulatory Visit: Payer: Medicare Other

## 2021-10-14 ENCOUNTER — Other Ambulatory Visit: Payer: Self-pay

## 2021-10-14 VITALS — BP 100/62 | HR 96 | Resp 20 | Wt 126.0 lb

## 2021-10-14 DIAGNOSIS — Z515 Encounter for palliative care: Secondary | ICD-10-CM

## 2021-10-14 NOTE — Progress Notes (Signed)
COMMUNITY PALLIATIVE CARE SW NOTE  PATIENT NAME: Albert Allen DOB: [birth year] MRN: [id number]  PRIMARY CARE PROVIDER: [name], MD  RESPONSIBLE PARTY:  Acct ID - Guarantor Home Phone Work Phone Relationship Acct Type  [id number] [name]  Self P/F     [address] [place], [place] [address]     PLAN OF CARE and INTERVENTIONS:             GOALS OF CARE/ ADVANCE CARE PLANNING:  Goal is for patient to remain in his home safely and comfortably and transition to hospice care. Patient is a DNR.  SOCIAL/EMOTIONAL/SPIRITUAL ASSESSMENT/ INTERVENTIONS:  SW and RN-B. Senor completed an initial visit with patient at his home. He was present with his sons-Tony & Brant. Patient was sitting in his chair, awake and alert and oriented x3 and greeted the team warmly. He reported generalized intermittent pain form several past broken limbs. Patient was hospitalized from 10/20-10/28. He was discharged and stayed at his son's home for a week before returning back to this own home. Since returning to his home patient report that he having increased shortness of breath where he is having to prop himself up on two pillows while in bed and leaning forward while sitting down in order to catch his breath. SW observed some shortness of breath while patient was talking during this visit and observed him leaning forward several times throughout the visit. Patient report that he does have a lot of anxiety about not being able to breath. He described a heaviness in his chest. Patient report that his appetite has decline. He eats a few bites of snacks and meals throughout the day. Patient's current weight is 126 lbs.Patient report having periods of feeling "woozy" and dizzy.  He describes feeling sick and nausea when he wakes up in the morning. Patient reported on three occassions where he has coughed up thick, bright red blood. Patient report that he is not sleeping well at night. He was also discharged  with an order for physical therapy through [facility], but has declined scheduling an appointment because he has not felt up to it. Patient has also not taken his medications for a few days by his own choice. He is scheduled to see his family doctor tomorrow (11/16) and he is scheduled to have an upper GI evaluation on 10/27/21. Patient expressed his goal of being able to remain in his home independently and to not to suffer as he feels he is suffering now. The team discussed hospice care with patient as it appears that he maybe eligible. Education was provided to patient regarding the benefits of hospice with his disease process. Patient agreed to a hospice consult and this was supported by both his sons. RN to follow-up with palliative care director and patient's primary care physician for eligibility. The patient/family was advised to call hospice with any additional questions or concerns until patient is transition to hospice.  PATIENT/CAREGIVER EDUCATION/ COPING:  Patient appears to be coping well. He has a supportive family and friends.  PERSONAL EMERGENCY PLAN:  911 can be activated for emergencies. Patient was  COMMUNITY RESOURCES COORDINATION/ HEALTH CARE NAVIGATION:  Patient receives regular meals from friends in the community. He has a PT order with [facility], but has declined to schedule a visit. FINANCIAL/LEGAL CONCERNS/INTERVENTIONS:  None.     SOCIAL HX:  Social History   Tobacco Use   Smoking status: Former    Types: Cigarettes    Quit date: 04/24/1998  Years since quitting: 23.4   Smokeless tobacco: Never  Substance Use Topics   Alcohol use: Yes    Alcohol/week: 5.0 standard drinks    Types: 5 Shots of liquor per week    Comment: Does not drink weekly but does buy a pint of liqour occasionally and will drink most of the bottle when he does    CODE STATUS: DNR ADVANCED DIRECTIVES: No MOST FORM COMPLETE:  No HOSPICE EDUCATION PROVIDED: No  PPS: Patient is alert and  oriented x3. He ambulates independently. He is having increased shortness of breath and anxiety.   Duration of visit and documentation: 60 minutes.  [address] [place], [place]

## 2021-10-15 ENCOUNTER — Other Ambulatory Visit: Payer: Self-pay

## 2021-10-15 ENCOUNTER — Ambulatory Visit (INDEPENDENT_AMBULATORY_CARE_PROVIDER_SITE_OTHER): Payer: Medicare Other

## 2021-10-15 ENCOUNTER — Ambulatory Visit (INDEPENDENT_AMBULATORY_CARE_PROVIDER_SITE_OTHER): Payer: Medicare Other | Admitting: Family Medicine

## 2021-10-15 ENCOUNTER — Encounter: Payer: Self-pay | Admitting: Family Medicine

## 2021-10-15 ENCOUNTER — Other Ambulatory Visit: Payer: Self-pay | Admitting: Family Medicine

## 2021-10-15 VITALS — BP 100/70 | HR 66 | Temp 96.8°F | Ht 67.0 in | Wt 128.6 lb

## 2021-10-15 DIAGNOSIS — E871 Hypo-osmolality and hyponatremia: Secondary | ICD-10-CM | POA: Diagnosis not present

## 2021-10-15 DIAGNOSIS — J90 Pleural effusion, not elsewhere classified: Secondary | ICD-10-CM | POA: Diagnosis not present

## 2021-10-15 DIAGNOSIS — R0600 Dyspnea, unspecified: Secondary | ICD-10-CM | POA: Diagnosis not present

## 2021-10-15 DIAGNOSIS — R601 Generalized edema: Secondary | ICD-10-CM | POA: Diagnosis not present

## 2021-10-15 DIAGNOSIS — N401 Enlarged prostate with lower urinary tract symptoms: Secondary | ICD-10-CM

## 2021-10-15 DIAGNOSIS — I5041 Acute combined systolic (congestive) and diastolic (congestive) heart failure: Secondary | ICD-10-CM

## 2021-10-15 DIAGNOSIS — R059 Cough, unspecified: Secondary | ICD-10-CM | POA: Diagnosis not present

## 2021-10-15 DIAGNOSIS — R350 Frequency of micturition: Secondary | ICD-10-CM

## 2021-10-15 DIAGNOSIS — Z23 Encounter for immunization: Secondary | ICD-10-CM

## 2021-10-15 DIAGNOSIS — F411 Generalized anxiety disorder: Secondary | ICD-10-CM

## 2021-10-15 DIAGNOSIS — J449 Chronic obstructive pulmonary disease, unspecified: Secondary | ICD-10-CM | POA: Diagnosis not present

## 2021-10-15 DIAGNOSIS — I480 Paroxysmal atrial fibrillation: Secondary | ICD-10-CM | POA: Diagnosis not present

## 2021-10-15 DIAGNOSIS — I517 Cardiomegaly: Secondary | ICD-10-CM | POA: Diagnosis not present

## 2021-10-15 MED ORDER — ALPRAZOLAM 0.5 MG PO TABS: 0.5000 mg | ORAL_TABLET | Freq: Every day | ORAL | 1 refills | Status: AC

## 2021-10-15 NOTE — Progress Notes (Signed)
Subjective:  Patient ID: Albert Allen, male    DOB: 02-11-1923  Age: 87 y.o. MRN: [id number]  CC: Transitions Of Care   HPI [name] presents for hospital follow up. Still dyspneic daily. Admitted from 10/20 to 09/26/21 att [facility]. Found to be in CHF. Diuresed with IV meds and transitioned to oral.  HE developed atrial fibrillation and was started on anticoagulant. Echo showed LVEF at 20-25 % referred to Hospice. Also had sodium as low as 124. MOst recent is 132. BNP at admission was 1,425.    DC summary of 10/28, Echo of 10/22 reviewed. During the stay  he had thoracentesis that removed approx 1400 ml of fluid from his chest cavity.   Pt. Noted in hospital to  have elevated TSH. His levothyoxin dose was increased.   Depression screen [facility] 2/9 10/15/2021 10/15/2021 06/19/2021  Decreased Interest 3 0 0  Down, Depressed, Hopeless 1 0 0  PHQ - 2 Score 4 0 0  Altered sleeping 2 - -  Tired, decreased energy 3 - -  Change in appetite 3 - -  Feeling bad or failure about yourself  0 - -  Trouble concentrating 0 - -  Moving slowly or fidgety/restless 0 - -  Suicidal thoughts 0 - -  PHQ-9 Score 12 - -  Difficult doing work/chores Very difficult - -  Some recent data might be hidden    History Leevon has a past medical history of ABNORMAL CV (STRESS) TEST (03/05/2009), ABUSE, ALCOHOL, IN REMISSION (05/31/2007), Adhesive capsulitis of shoulder (12/29/2007), Alcoholic polyneuropathy (HCC) (03/29/2008), ANEMIA, B12 DEFICIENCY (03/29/2008), Anxiety state, unspecified (11/19/2008), ASTHMA (05/31/2007), Asthma, CAD, NATIVE VESSEL (04/04/2009), CARPAL TUNNEL SYNDROME, RIGHT (09/05/2007), Cataract, Chronic prostatitis (03/29/2008), COPD (chronic obstructive pulmonary disease) (HCC), Crohn's disease (HCC), Degenerative disc disease, cervical, DEGENERATIVE DISC DISEASE, CERVICAL SPINE (06/22/2007), Diverticulosis, DYSPHAGIA UNSPECIFIED (09/25/2008), Esophageal stricture, External hemorrhoids, GERD (05/31/2007),  GOUT (05/31/2007), Hepatomegaly (09/25/2008), Hiatal hernia, HIP PAIN (03/20/2010), History of echocardiogram, HYPERLIPIDEMIA (03/27/2009), HYPERTENSION (05/31/2007), HYPOTHYROIDISM (05/31/2007), LEFT BUNDLE BRANCH BLOCK (02/11/2009), Liver hemangioma, Liver mass, left lobe, LOC OSTEOARTHROS NOT SPEC PRIM/SEC LOWER LEG (03/20/2010), OSTEOARTHRITIS (05/31/2007), PERSONAL HX COLONIC POLYPS (03/05/1997), PSA, INCREASED (03/20/2010), SINUSITIS, CHRONIC NOS (05/31/2007), Sore throat, SYNCOPE (11/19/2008), and WEIGHT LOSS, RECENT (11/19/2008).   He has a past surgical history that includes pul. colapse w/chest tube; Tonsillectomy; carpel tunnel release; ORIF ulnar fracture (11/03/2012); Eye surgery; Inguinal hernia repair (12/18/2015); Inguinal hernia repair (Right, 12/18/2015); Insertion of mesh (Right, 12/18/2015); Cardiac catheterization; and IR THORACENTESIS ASP PLEURAL SPACE W/IMG GUIDE (09/22/2021).   His family history includes Alcohol abuse in his father; Cancer in his brother and mother; Diabetes in his brother, sister, and sister; Lung disease in his father.He reports that he quit smoking about 23 years ago. His smoking use included cigarettes. He has never used smokeless tobacco. He reports current alcohol use of about 5.0 standard drinks per week. He reports that he does not use drugs.    ROS Review of Systems  Constitutional:  Positive for activity change (decreased due to weakness) and appetite change (decreased).  HENT: Negative.    Eyes:  Negative for visual disturbance.  Respiratory:  Positive for shortness of breath. Negative for cough.   Cardiovascular:  Negative for chest pain and leg swelling.  Gastrointestinal:  Negative for abdominal pain, diarrhea, nausea and vomiting.  Genitourinary:  Negative for difficulty urinating.  Musculoskeletal:  Negative for arthralgias and myalgias.  Skin:  Negative for rash.  Neurological:  Negative for headaches.  Psychiatric/Behavioral:  Negative for sleep  disturbance.    Objective:  BP 100/70   Pulse 66   Temp (!) 96.8 F (36 C)   Ht $R'5\' 7"'NG$  (1.702 m)   Wt 128 lb 9.6 oz (58.3 kg)   SpO2 97%   BMI 20.14 kg/m   BP Readings from Last 3 Encounters:  10/15/21 100/70  10/09/21 (!) 102/40  09/26/21 (!) 109/54    Wt Readings from Last 3 Encounters:  10/15/21 128 lb 9.6 oz (58.3 kg)  10/09/21 126 lb 9.6 oz (57.4 kg)  09/26/21 115 lb 3.2 oz (52.3 kg)  Wt. Up 13 lb since DC 19 days ago.    Physical Exam Constitutional:      General: He is not in acute distress.    Appearance: He is well-developed.  HENT:     Head: Normocephalic and atraumatic.     Right Ear: External ear normal.     Left Ear: External ear normal.     Nose: Nose normal.  Eyes:     Conjunctiva/sclera: Conjunctivae normal.     Pupils: Pupils are equal, round, and reactive to light.  Cardiovascular:     Rate and Rhythm: Normal rate and regular rhythm.     Heart sounds: Normal heart sounds. No murmur heard. Pulmonary:     Effort: Pulmonary effort is normal. No respiratory distress.     Breath sounds: No wheezing, rhonchi or rales.     Comments: Diminished breath sounds of COPD. No active rales indicative of CHF are noted. Abdominal:     Palpations: Abdomen is soft.     Tenderness: There is no abdominal tenderness.  Musculoskeletal:        General: Normal range of motion.     Cervical back: Normal range of motion and neck supple.  Skin:    General: Skin is warm and dry.  Neurological:     Mental Status: He is alert and oriented to person, place, and time.     Deep Tendon Reflexes: Reflexes are normal and symmetric.  Psychiatric:        Behavior: Behavior normal.        Thought Content: Thought content normal.        Judgment: Judgment normal.      Assessment & Plan:   Jj was seen today for transitions of care.  Diagnoses and all orders for this visit:  Acute combined systolic and diastolic heart failure ([place]) -     DG Chest 2 View; Future -      Pro b natriuretic peptide (BNP) -     CBC with Differential/Platelet -     CMP14+EGFR  GAD (generalized anxiety disorder) -     ALPRAZolam (XANAX) 0.5 MG tablet; Take 1 tablet (0.5 mg total) by mouth at bedtime.  Hyponatremia -     Pro b natriuretic peptide (BNP) -     CMP14+EGFR  Generalized edema -     CMP14+EGFR  Paroxysmal atrial fibrillation (HCC)  Pleural effusion  Likely active CHF with weight gain noted. Will have him take an extra lasix today since he is dyspneic. IF CXR & labs are indicative, will increase diuresis. Consider B Blockade for future CHF as well as rate control for A .Fib    I have changed Coralyn Pear. Bonsell's ALPRAZolam. I am also having him maintain his Cyanocobalamin (VITAMIN B-12 PO), PreserVision AREDS, ProAir RespiClick, albuterol, Linzess, silodosin, pantoprazole, magnesium hydroxide, hydroxypropyl methylcellulose / hypromellose, levothyroxine, furosemide, atorvastatin, apixaban, ferrous sulfate, polyethylene glycol, and famotidine.  Allergies as of 10/15/2021       Reactions   Amoxicillin Swelling, Other (See Comments)   Pt has swelling with PCN's   Penicillins Swelling   Lips swelling Has patient had a PCN reaction causing immediate rash, facial/tongue/throat swelling, SOB or lightheadedness with hypotension: No Has patient had a PCN reaction causing severe rash involving mucus membranes or skin necrosis: No Has patient had a PCN reaction that required hospitalization No Has patient had a PCN reaction occurring within the last 10 years: No If all of the above answers are "NO", then may proceed with Cephalosporin use.   Ceclor [cefaclor] Other (See Comments)   Pt does not remember reaction   Celebrex [celecoxib] Other (See Comments)   Pt does not remember reaction   Levaquin [levofloxacin In D5w] Other (See Comments)   Pt does not remember reaction        Medication List        Accurate as of October 15, 2021  1:05 PM. If you have any  questions, ask your nurse or doctor.          ALPRAZolam 0.5 MG tablet Commonly known as: XANAX Take 1 tablet (0.5 mg total) by mouth at bedtime. What changed: additional instructions Changed by: [name], MD   apixaban 2.5 MG Tabs tablet Commonly known as: ELIQUIS Take 1 tablet (2.5 mg total) by mouth 2 (two) times daily. Start taking on: October 27, 2021   atorvastatin 40 MG tablet Commonly known as: LIPITOR Take 1 tablet (40 mg total) by mouth at bedtime.   famotidine 20 MG tablet Commonly known as: PEPCID Take 1 tablet (20 mg total) by mouth at bedtime.   ferrous sulfate 325 (65 FE) MG tablet Take 1 tablet (325 mg total) by mouth daily.   furosemide 20 MG tablet Commonly known as: LASIX Take 1 tablet (20 mg total) by mouth daily.   hydroxypropyl methylcellulose / hypromellose 2.5 % ophthalmic solution Commonly known as: ISOPTO TEARS / GONIOVISC Place 1 drop into both eyes daily.   levothyroxine 100 MCG tablet Commonly known as: SYNTHROID Take 1 tablet (100 mcg total) by mouth daily at 6 (six) AM.   Linzess 72 MCG capsule Generic drug: linaclotide TAKE 1 CAPSULE DAILY BEFORE BREAKFAST   magnesium hydroxide 400 MG/5ML suspension Commonly known as: MILK OF MAGNESIA Take 15 mLs by mouth daily as needed for mild constipation.   pantoprazole 40 MG tablet Commonly known as: PROTONIX TAKE 1 TABLET BY MOUTH  DAILY   polyethylene glycol 17 g packet Commonly known as: MIRALAX / GLYCOLAX Take 17 g by mouth daily.   PreserVision AREDS Caps Take 1 capsule by mouth daily.   ProAir RespiClick 426 (90 Base) MCG/ACT Aepb Generic drug: Albuterol Sulfate Inhale 2 puffs into the lungs 4 (four) times daily as needed (shortness of breath). What changed: Another medication with the same name was changed. Make sure you understand how and when to take each.   albuterol 108 (90 Base) MCG/ACT inhaler Commonly known as: VENTOLIN HFA INHALE 2 PUFFS EVERY 6 HOURS AS  NEEDED FOR WHEEZING OR SHORTNESS OF BREATH What changed: See the new instructions.   silodosin 4 MG Caps capsule Commonly known as: RAPAFLO TAKE 1 CAPSULE ONCE A DAY What changed: See the new instructions.   VITAMIN B-12 PO Take 1 tablet by mouth daily.         56 minutes spent with pt. Regarding above conditions and plan of care. More than 1/2 was spent in  counseling/discussion of care and goals considering quality of life vs. Quantity issues.  Follow-up: Return in about 2 weeks (around 10/29/2021).  [name], M.D.

## 2021-10-16 DIAGNOSIS — M1990 Unspecified osteoarthritis, unspecified site: Secondary | ICD-10-CM | POA: Diagnosis not present

## 2021-10-16 DIAGNOSIS — E039 Hypothyroidism, unspecified: Secondary | ICD-10-CM | POA: Diagnosis not present

## 2021-10-16 DIAGNOSIS — D518 Other vitamin B12 deficiency anemias: Secondary | ICD-10-CM | POA: Diagnosis not present

## 2021-10-16 DIAGNOSIS — G4700 Insomnia, unspecified: Secondary | ICD-10-CM | POA: Diagnosis not present

## 2021-10-16 DIAGNOSIS — K5750 Diverticulosis of both small and large intestine without perforation or abscess without bleeding: Secondary | ICD-10-CM | POA: Diagnosis not present

## 2021-10-16 DIAGNOSIS — E785 Hyperlipidemia, unspecified: Secondary | ICD-10-CM | POA: Diagnosis not present

## 2021-10-16 DIAGNOSIS — I110 Hypertensive heart disease with heart failure: Secondary | ICD-10-CM | POA: Diagnosis not present

## 2021-10-16 DIAGNOSIS — I5033 Acute on chronic diastolic (congestive) heart failure: Secondary | ICD-10-CM | POA: Diagnosis not present

## 2021-10-16 DIAGNOSIS — M5030 Other cervical disc degeneration, unspecified cervical region: Secondary | ICD-10-CM | POA: Diagnosis not present

## 2021-10-16 DIAGNOSIS — Z7901 Long term (current) use of anticoagulants: Secondary | ICD-10-CM | POA: Diagnosis not present

## 2021-10-16 DIAGNOSIS — I2510 Atherosclerotic heart disease of native coronary artery without angina pectoris: Secondary | ICD-10-CM | POA: Diagnosis not present

## 2021-10-16 DIAGNOSIS — G5601 Carpal tunnel syndrome, right upper limb: Secondary | ICD-10-CM | POA: Diagnosis not present

## 2021-10-16 DIAGNOSIS — M7500 Adhesive capsulitis of unspecified shoulder: Secondary | ICD-10-CM | POA: Diagnosis not present

## 2021-10-16 DIAGNOSIS — K219 Gastro-esophageal reflux disease without esophagitis: Secondary | ICD-10-CM | POA: Diagnosis not present

## 2021-10-16 DIAGNOSIS — M109 Gout, unspecified: Secondary | ICD-10-CM | POA: Diagnosis not present

## 2021-10-16 DIAGNOSIS — I447 Left bundle-branch block, unspecified: Secondary | ICD-10-CM | POA: Diagnosis not present

## 2021-10-16 DIAGNOSIS — Z87891 Personal history of nicotine dependence: Secondary | ICD-10-CM | POA: Diagnosis not present

## 2021-10-16 DIAGNOSIS — I5021 Acute systolic (congestive) heart failure: Secondary | ICD-10-CM | POA: Diagnosis not present

## 2021-10-16 DIAGNOSIS — D539 Nutritional anemia, unspecified: Secondary | ICD-10-CM | POA: Diagnosis not present

## 2021-10-16 DIAGNOSIS — J449 Chronic obstructive pulmonary disease, unspecified: Secondary | ICD-10-CM | POA: Diagnosis not present

## 2021-10-16 LAB — PRO B NATRIURETIC PEPTIDE: NT-Pro BNP: 13919 pg/mL — ABNORMAL HIGH (ref 0–486)

## 2021-10-16 LAB — CMP14+EGFR
ALT: 8 IU/L (ref 0–44)
AST: 14 IU/L (ref 0–40)
Albumin/Globulin Ratio: 1.5 (ref 1.2–2.2)
Albumin: 3.7 g/dL (ref 3.5–4.6)
Alkaline Phosphatase: 82 IU/L (ref 44–121)
BUN/Creatinine Ratio: 19 (ref 10–24)
BUN: 21 mg/dL (ref 10–36)
Bilirubin Total: 0.9 mg/dL (ref 0.0–1.2)
CO2: 24 mmol/L (ref 20–29)
Calcium: 8.6 mg/dL (ref 8.6–10.2)
Chloride: 90 mmol/L — ABNORMAL LOW (ref 96–106)
Creatinine, Ser: 1.13 mg/dL (ref 0.76–1.27)
Globulin, Total: 2.4 g/dL (ref 1.5–4.5)
Glucose: 116 mg/dL — ABNORMAL HIGH (ref 70–99)
Potassium: 4 mmol/L (ref 3.5–5.2)
Sodium: 129 mmol/L — ABNORMAL LOW (ref 134–144)
Total Protein: 6.1 g/dL (ref 6.0–8.5)
eGFR: 59 mL/min/{1.73_m2} — ABNORMAL LOW (ref >59)

## 2021-10-16 LAB — CBC WITH DIFFERENTIAL/PLATELET
Basophils Absolute: 0 10*3/uL (ref 0.0–0.2)
Basos: 0 %
EOS (ABSOLUTE): 1.4 10*3/uL — ABNORMAL HIGH (ref 0.0–0.4)
Eos: 20 %
Hematocrit: 30.3 % — ABNORMAL LOW (ref 37.5–51.0)
Hemoglobin: 10.4 g/dL — ABNORMAL LOW (ref 13.0–17.7)
Immature Grans (Abs): 0 10*3/uL (ref 0.0–0.1)
Immature Granulocytes: 1 %
Lymphocytes Absolute: 1.2 10*3/uL (ref 0.7–3.1)
Lymphs: 17 %
MCH: 35.7 pg — ABNORMAL HIGH (ref 26.6–33.0)
MCHC: 34.3 g/dL (ref 31.5–35.7)
MCV: 104 fL — ABNORMAL HIGH (ref 79–97)
Monocytes Absolute: 1.9 10*3/uL — ABNORMAL HIGH (ref 0.1–0.9)
Monocytes: 28 %
Neutrophils Absolute: 2.3 10*3/uL (ref 1.4–7.0)
Neutrophils: 34 %
Platelets: 144 10*3/uL — ABNORMAL LOW (ref 150–450)
RBC: 2.91 x10E6/uL — ABNORMAL LOW (ref 4.14–5.80)
RDW: 14.1 % (ref 11.6–15.4)
WBC: 6.8 10*3/uL (ref 3.4–10.8)

## 2021-10-16 NOTE — Progress Notes (Signed)
Please increase the Lasix 20 mg to one twice a day for one week. Then decrease to once a day.

## 2021-10-16 NOTE — Progress Notes (Signed)
PATIENT NAME: Albert Allen DOB: [birth year] MRN: [id number]  PRIMARY CARE PROVIDER: [name], MD  RESPONSIBLE PARTY:  Acct ID - Guarantor Home Phone Work Phone Relationship Acct Type  [id number] [name]  Self P/F     [address] [place], [place] [address]  COMMUNITY PALLIATIVE CARE RN NOTE    PLAN OF CARE and INTERVENTION:   ADVANCE CARE PLANNING/GOALS OF CARE: Safety, DNR, Remain in his home, stopping excess MD appointment, to be able to stay in his home and be comfortable and peaceful. Speaks often of being ready to meet his "[place]" who he lost 3 years ago after 67 years of marriage.  2.PATIENT/CAREGIVER EDUCATION: Education regarding Palliative vs Hospice services, disease status  3. DISEASE STATUS: Initial Palliative care visit made today with SW- Monica. Met with patient and his 2 sons in his home. Patient was recently d/c from the hospital. (10/20-10/28)                                        He continues with worsening shortness of breath, worsening chest pressure and tightness, and increased weakness. He is hypotensive and hyponatremic. This RN noted patient became dyspneic while sitting and talking.  New onset of orthopnea -now reports needing to sleep with 2 pillows propping him up. He also needs to lean forward in his chair to be able to catch his breath. Documentation of SPO2 at 88 while ambulating short distances. SPO2 ranged from 84-98 depending on level of conversation. He was discharged from hospital with order for PT but has declined initiating this due shortness of breath/chest heaviness and tightness. Patient reports 3 episodes of hemoptysis since returning home from the hospital. He also shares he has a sense that he is going to die. He has worsening anxiety and subsequent difficulty sleeping. He says when he does sleep, he will wake up in a panic.   PO intake has declined. He reports that he has no desire to eat and must force himself to eat  anything. He is eating bites of snacks and meals throughout the day.  October 2022- albumin 3.1. His last weight reported to be at 124. BMI- 19.6.  He c/o lower abdominal pain and awakes with nausea daily.   He underwent a cardiac catheterization in 2010 which showed 70-80% heavily calcified stenosis in mid LAD as well as diffuse mildly obstructed disease. No surgical intervention.  In this past hospitalization, echocardiogram showed an EF of 20-25% with decrease function. Cardiology consult completed - demand ischemia from acute CHF.  He is more fatigued and now must rely on people to help him with tasks that he was completing independently a month ago. He is able complete his own ADLS. Although this is taking longer and tires him. He reports feeling dizzy often.    HISTORY OF PRESENT ILLNESS: This is an 87 year old male with PMH including but not limited to hypertension, PAD, CKD 3, DM, and Osteoarthritis. Discussed hospice services, patient and family would like to pursue. Palliative care has been asked to follow for additional support, goals of care and complex decision making.  CODE STATUS:  DNR PPS: 40%    PHYSICAL EXAM:    Skin: Warm, Dry, Intact   Lungs: Clear, congestion, occasional, hemoptysis, becomes dyspneic, orthopnea   Cardiac: Reports some chest pressure, no edema noted Abdomen: Reports nausea each morning, BS positive Neurological: Alert and Oriented x  4, positive for weakness.          [name] RN, BSN     [name], RN

## 2021-10-17 ENCOUNTER — Telehealth: Payer: Self-pay | Admitting: Family Medicine

## 2021-10-20 ENCOUNTER — Other Ambulatory Visit: Payer: Medicare Other | Admitting: *Deleted

## 2021-10-20 ENCOUNTER — Telehealth: Payer: Self-pay

## 2021-10-20 ENCOUNTER — Other Ambulatory Visit: Payer: Self-pay

## 2021-10-20 ENCOUNTER — Other Ambulatory Visit: Payer: Self-pay | Admitting: Internal Medicine

## 2021-10-20 ENCOUNTER — Ambulatory Visit (INDEPENDENT_AMBULATORY_CARE_PROVIDER_SITE_OTHER): Payer: Medicare Other

## 2021-10-20 DIAGNOSIS — E785 Hyperlipidemia, unspecified: Secondary | ICD-10-CM

## 2021-10-20 DIAGNOSIS — E039 Hypothyroidism, unspecified: Secondary | ICD-10-CM | POA: Diagnosis not present

## 2021-10-20 DIAGNOSIS — G621 Alcoholic polyneuropathy: Secondary | ICD-10-CM

## 2021-10-20 DIAGNOSIS — I5033 Acute on chronic diastolic (congestive) heart failure: Secondary | ICD-10-CM | POA: Diagnosis not present

## 2021-10-20 DIAGNOSIS — D518 Other vitamin B12 deficiency anemias: Secondary | ICD-10-CM | POA: Diagnosis not present

## 2021-10-20 DIAGNOSIS — M5030 Other cervical disc degeneration, unspecified cervical region: Secondary | ICD-10-CM

## 2021-10-20 DIAGNOSIS — K2100 Gastro-esophageal reflux disease with esophagitis, without bleeding: Secondary | ICD-10-CM

## 2021-10-20 DIAGNOSIS — G5601 Carpal tunnel syndrome, right upper limb: Secondary | ICD-10-CM

## 2021-10-20 DIAGNOSIS — I447 Left bundle-branch block, unspecified: Secondary | ICD-10-CM

## 2021-10-20 DIAGNOSIS — K5750 Diverticulosis of both small and large intestine without perforation or abscess without bleeding: Secondary | ICD-10-CM

## 2021-10-20 DIAGNOSIS — D539 Nutritional anemia, unspecified: Secondary | ICD-10-CM

## 2021-10-20 DIAGNOSIS — I2510 Atherosclerotic heart disease of native coronary artery without angina pectoris: Secondary | ICD-10-CM | POA: Diagnosis not present

## 2021-10-20 DIAGNOSIS — I5021 Acute systolic (congestive) heart failure: Secondary | ICD-10-CM | POA: Diagnosis not present

## 2021-10-20 DIAGNOSIS — F419 Anxiety disorder, unspecified: Secondary | ICD-10-CM

## 2021-10-20 DIAGNOSIS — F1011 Alcohol abuse, in remission: Secondary | ICD-10-CM

## 2021-10-20 DIAGNOSIS — M1990 Unspecified osteoarthritis, unspecified site: Secondary | ICD-10-CM

## 2021-10-20 DIAGNOSIS — J449 Chronic obstructive pulmonary disease, unspecified: Secondary | ICD-10-CM | POA: Diagnosis not present

## 2021-10-20 DIAGNOSIS — N400 Enlarged prostate without lower urinary tract symptoms: Secondary | ICD-10-CM

## 2021-10-20 DIAGNOSIS — G4700 Insomnia, unspecified: Secondary | ICD-10-CM

## 2021-10-20 DIAGNOSIS — I110 Hypertensive heart disease with heart failure: Secondary | ICD-10-CM | POA: Diagnosis not present

## 2021-10-20 DIAGNOSIS — K219 Gastro-esophageal reflux disease without esophagitis: Secondary | ICD-10-CM

## 2021-10-20 DIAGNOSIS — M7500 Adhesive capsulitis of unspecified shoulder: Secondary | ICD-10-CM

## 2021-10-20 DIAGNOSIS — M109 Gout, unspecified: Secondary | ICD-10-CM

## 2021-10-20 NOTE — Telephone Encounter (Signed)
Phone call placed to [facility] to follow up with patient and son's desire to transition to Hospice services under [place]. VM left with call back information. PCP also updated.

## 2021-10-22 ENCOUNTER — Telehealth: Payer: Self-pay

## 2021-10-22 NOTE — Telephone Encounter (Signed)
Patient aware and verbalized understanding. Patient states he is very SOB

## 2021-10-22 NOTE — Telephone Encounter (Signed)
Increase the furosemide to three a day for the next four days. Keep feet elevated higher than the heart.

## 2021-10-22 NOTE — Telephone Encounter (Signed)
Patient reports swelling bilaterally in feet and ankles.  He is taking Furosemide twice daily and would like to know what you recommend.  He has a follow up appointment scheduled with you next Thursday 10/30/21.

## 2021-10-22 NOTE — Telephone Encounter (Signed)
Patient aware and verbalized understanding. °

## 2021-10-22 NOTE — Telephone Encounter (Signed)
For shortness of breath he will need to go to the hospital.

## 2021-10-27 ENCOUNTER — Inpatient Hospital Stay (HOSPITAL_COMMUNITY): Admission: RE | Admit: 2021-10-27 | Payer: Medicare Other | Source: Ambulatory Visit

## 2021-10-30 ENCOUNTER — Ambulatory Visit: Payer: Medicare Other | Admitting: Family Medicine

## 2021-11-06 DIAGNOSIS — R0902 Hypoxemia: Secondary | ICD-10-CM | POA: Diagnosis not present

## 2021-11-06 DIAGNOSIS — I499 Cardiac arrhythmia, unspecified: Secondary | ICD-10-CM | POA: Diagnosis not present

## 2021-11-06 DIAGNOSIS — Z743 Need for continuous supervision: Secondary | ICD-10-CM | POA: Diagnosis not present

## 2021-11-06 DIAGNOSIS — R279 Unspecified lack of coordination: Secondary | ICD-10-CM | POA: Diagnosis not present

## 2021-11-06 DIAGNOSIS — Z7401 Bed confinement status: Secondary | ICD-10-CM | POA: Diagnosis not present

## 2021-11-30 NOTE — Progress Notes (Deleted)
Cardiology Office Note    Date:  12/03/2021   ID:  Abel, Hageman May 29, 1923, MRN [id number]   PCP:  [name], [place]  Cardiologist:  Lauree Chandler, MD *** Advanced Practice Provider:  No care team member to display Electrophysiologist:  None   [phone]   No chief complaint on file.   History of Present Illness:  Albert Allen is a 88 y.o. male  with a hx of diffuse CAD (medical management), hypertension, anemia, asthma/COPD, GERD, EtOH use and hypothyroidism    Patient admitted 08/2021 with chest pain, CHF and new PAF. EF down 20-25% on echo.CT large pleural effusions. Diuresed and not sent home on ACEI/ARB b/c of low BP's  Past Medical History:  Diagnosis Date   ABNORMAL CV (STRESS) TEST 03/05/2009   ABUSE, ALCOHOL, IN REMISSION 05/31/2007   Adhesive capsulitis of shoulder 2/37/6283   Alcoholic polyneuropathy ([place]) 03/29/2008   ANEMIA, B12 DEFICIENCY 03/29/2008   Anxiety state, unspecified 11/19/2008   ASTHMA 05/31/2007   Asthma    CAD, NATIVE VESSEL 04/04/2009   CARPAL TUNNEL SYNDROME, RIGHT 09/05/2007   Cataract    removed bilaterally   Chronic prostatitis 03/29/2008   COPD (chronic obstructive pulmonary disease) ([place])    "they told me I have a touch of COPD"   Crohn's disease ([place])    Degenerative disc disease, cervical    DEGENERATIVE DISC DISEASE, CERVICAL SPINE 06/22/2007   Diverticulosis    DYSPHAGIA UNSPECIFIED 09/25/2008   Esophageal stricture    External hemorrhoids    GERD 05/31/2007   GOUT 05/31/2007   Hepatomegaly 09/25/2008   Hiatal hernia    HIP PAIN 03/20/2010   History of echocardiogram    Echo 4/17 - mild LVH, EF 50-55%, Gr 1 DD, trivial AI, MAC, mild MR   HYPERLIPIDEMIA 03/27/2009   HYPERTENSION 05/31/2007   HYPOTHYROIDISM 05/31/2007   LEFT BUNDLE BRANCH BLOCK 02/11/2009   Liver hemangioma    Liver mass, left lobe    LOC OSTEOARTHROS NOT SPEC PRIM/SEC LOWER LEG 03/20/2010   OSTEOARTHRITIS 05/31/2007    PERSONAL HX COLONIC POLYPS 03/05/1997   adenomatous    PSA, INCREASED 03/20/2010   SINUSITIS, CHRONIC NOS 05/31/2007   Sore throat    CURRENTLY   SYNCOPE 11/19/2008   WEIGHT LOSS, RECENT 11/19/2008    Past Surgical History:  Procedure Laterality Date   CARDIAC CATHETERIZATION     carpel tunnel release     EYE SURGERY     INGUINAL HERNIA REPAIR  12/18/2015   INGUINAL HERNIA REPAIR Right 12/18/2015   Procedure: OPEN REPAIR RIGHT INGUINAL HERNIA ;  Surgeon: Greer Pickerel, MD;  Location: [facility];  Service: General;  Laterality: Right;   INSERTION OF MESH Right 12/18/2015   Procedure: INSERTION OF MESH;  Surgeon: Greer Pickerel, MD;  Location: [facility];  Service: General;  Laterality: Right;   IR THORACENTESIS ASP PLEURAL SPACE W/IMG GUIDE  09/22/2021   ORIF ULNAR FRACTURE  11/03/2012   Procedure: OPEN REDUCTION INTERNAL FIXATION (ORIF) ULNAR FRACTURE;  Surgeon: Hessie Dibble, MD;  Location: [facility];  Service: Orthopedics;  Laterality: Right;  ORIF radius and ulnar fractures   pul. colapse w/chest tube     TONSILLECTOMY      Current Medications: No outpatient medications have been marked as taking for the 12/03/21 encounter (Appointment) with Imogene Burn, PA-C.     Allergies:   Amoxicillin, Penicillins, Ceclor [cefaclor], Celebrex [celecoxib], and Levaquin [levofloxacin in d5w]  Social History   Socioeconomic History   Marital status: Married    Spouse name: Not on file   Number of children: 2   Years of education: 9   Highest education level: 9th grade  Occupational History   Occupation: retired    Comment: Oncologist  Tobacco Use   Smoking status: Former    Types: Cigarettes    Quit date: 04/24/1998    Years since quitting: 23.6   Smokeless tobacco: Never  Vaping Use   Vaping Use: Never used  Substance and Sexual Activity   Alcohol use: Yes    Alcohol/week: 5.0 standard drinks    Types: 5 Shots of liquor per week    Comment: Does not drink weekly but does buy a pint of  liqour occasionally and will drink most of the bottle when he does   Drug use: No   Sexual activity: Not Currently  Other Topics Concern   Not on file  Social History Narrative   Not on file   Social Determinants of Health   Financial Resource Strain: Not on file  Food Insecurity: Not on file  Transportation Needs: Not on file  Physical Activity: Not on file  Stress: Not on file  Social Connections: Not on file     Family History:  The patient's ***family history includes Alcohol abuse in his father; Cancer in his brother and mother; Diabetes in his brother, sister, and sister; Lung disease in his father.   ROS:   Please see the history of present illness.    ROS All other systems reviewed and are negative.   PHYSICAL EXAM:   VS:  There were no vitals taken for this visit.  Physical Exam  GEN: Well nourished, well developed, in no acute distress  HEENT: normal  Neck: no JVD, carotid bruits, or masses Cardiac:RRR; no murmurs, rubs, or gallops  Respiratory:  clear to auscultation bilaterally, normal work of breathing GI: soft, nontender, nondistended, + BS Ext: without cyanosis, clubbing, or edema, Good distal pulses bilaterally MS: no deformity or atrophy  Skin: warm and dry, no rash Neuro:  Alert and Oriented x 3, Strength and sensation are intact Psych: euthymic mood, full affect  Wt Readings from Last 3 Encounters:  10/15/21 128 lb 9.6 oz (58.3 kg)  10/14/21 126 lb (57.2 kg)  10/09/21 126 lb 9.6 oz (57.4 kg)      Studies/Labs Reviewed:   EKG:  EKG is*** ordered today.  The ekg ordered today demonstrates ***  Recent Labs: 09/19/2021: B Natriuretic Peptide 1,425.5 09/20/2021: Magnesium 1.9; TSH 8.620 10/15/2021: ALT 8; BUN 21; Creatinine, Ser 1.13; Hemoglobin 10.4; NT-Pro BNP 13,919; Platelets 144; Potassium 4.0; Sodium 129   Lipid Panel    Component Value Date/Time   CHOL 120 09/18/2020 0942   CHOL 177 05/01/2013 1059   TRIG 134 09/18/2020 0942   TRIG  196 (H) 05/01/2013 1059   HDL 46 09/18/2020 0942   HDL 45 05/01/2013 1059   CHOLHDL 2.6 09/18/2020 0942   CHOLHDL 4 09/22/2012 1051   VLDL 35.2 09/22/2012 1051   LDLCALC 51 09/18/2020 0942   LDLCALC 93 05/01/2013 1059   LDLDIRECT 67.7 01/12/2012 0823    Additional studies/ records that were reviewed today include:  Echo 09/20/21  1. Left ventricular ejection fraction, by estimation, is 20 to 25%. The  left ventricle has severely decreased function. The left ventricle  demonstrates regional wall motion abnormalities (see scoring  diagram/findings for description). Left ventricular  diastolic parameters are indeterminate.  Elevated left ventricular  end-diastolic pressure.   2. Right ventricular systolic function is normal. The right ventricular  size is normal. There is normal pulmonary artery systolic pressure.   3. The mitral valve is grossly normal. Mild to moderate mitral valve  regurgitation. No evidence of mitral stenosis.   4. The aortic valve was not well visualized. There is moderate  calcification of the aortic valve. Aortic valve regurgitation is trivial.  Mild to moderate aortic valve sclerosis/calcification is present, without  any evidence of aortic stenosis.   5. The inferior vena cava is normal in size with greater than 50%  respiratory variability, suggesting right atrial pressure of 3 mmHg.   Comparison(s): Changes from prior study are noted   Risk Assessment/Calculations:   {Does this patient have ATRIAL FIBRILLATION?:[phone]}     ASSESSMENT:    No diagnosis found.   PLAN:  In order of problems listed above:  Combined systolic and diastolic CHF, LVEF 48-54% echo 08/2021  ICM EF now 20-25% given advanced age medical therapy recommended on toprol sprio, (lisinopril stopped by nephrology b/c low BP  CAD known 70-80% LAD 2010 not amenable to PCI-ASA stopped since on eliquis  PAF-on low dose eliquis  Hyponatremia resolved  Shared Decision  Making/Informed Consent   {Are you ordering a CV Procedure (e.g. stress test, cath, DCCV, TEE, etc)?   Press F2        :[id number]}    Medication Adjustments/Labs and Tests Ordered: Current medicines are reviewed at length with the patient today.  Concerns regarding medicines are outlined above.  Medication changes, Labs and Tests ordered today are listed in the Patient Instructions below. There are no Patient Instructions on file for this visit.   Sumner Boast, PA-C  18-Dec-2021 8:23 AM    [facility] [address], [place], [place]  [address] Phone: [phone]; Fax: [phone]

## 2021-11-30 DEATH — deceased

## 2021-12-03 ENCOUNTER — Ambulatory Visit: Payer: Medicare Other | Admitting: Physician Assistant
